# Patient Record
Sex: Female | Born: 1995
Health system: Northeastern US, Community
[De-identification: ages and names within clinical notes are randomized; demographics above are authoritative.]

## PROBLEM LIST (undated history)

## (undated) ENCOUNTER — Inpatient Hospital Stay (HOSPITAL_COMMUNITY): Payer: Self-pay

## (undated) DIAGNOSIS — Z789 Other specified health status: Secondary | ICD-10-CM

## (undated) HISTORY — PX: NO SIGNIFICANT SURGICAL HISTORY: 1000005

## (undated) HISTORY — PX: TONSILLECTOMY: SUR1361

## (undated) HISTORY — PX: HERNIA REPAIR: SHX51

---
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
## 2014-07-07 ENCOUNTER — Telehealth (HOSPITAL_BASED_OUTPATIENT_CLINIC_OR_DEPARTMENT_OTHER): Payer: Self-pay | Admitting: Family Medicine

## 2014-07-07 DIAGNOSIS — H35419 Lattice degeneration of retina, unspecified eye: Secondary | ICD-10-CM

## 2014-07-07 NOTE — Progress Notes (Signed)
Please call this patient and let her know that she should not be seeing an optometrist any more, only ophthalmologists for her eye care.  I can refer her to our [facility], but the [facility] referral is not recommended for her.

## 2014-07-07 NOTE — Progress Notes (Signed)
Appointment scheduled IPJ:[id number]    Specialty Location:   Other: [facility] in [place]                                                   Specialist's name: Dr Venita Sheffield, O.D        Specialist's NPI#: [id number]                            Specialty Phone Number: [phone]     Specialty Fax Number: [phone]    Reason for appointment: Routine eye exam    Day of appt: Friday                                    Date of appt: 07/08/14    Time of appt:    Patient Notification:  Phone call - Spoke with patient mother      If you have any questions concerning the referral authorization please call [phone].    If you have any questions concerning your appointment please call the specialty phone number above.

## 2014-07-15 NOTE — Progress Notes (Signed)
TELEPHONE ENCOUNTER: RETURNED CALL  Spoke to pt. Re: Referral  Pt ID by name and DOB.  Relayed information below exactly as ordered by Dr. Sabra Heck.  Pt reports she has been seeing this optometrist 4-5 years now.  She does not want to switch at this time.  This RN also explained the different between Opthalmologist and Optometrist.  Pt reports she wants to continue going to [facility].  Reports she is unable to book an appt without a referral. Due for a general eye exam.  Informed pt will consult with PCP for recommendations on request.  Pt verbalized understanding to all information provided. Will await call back with recommendations    Msg routed to Dr. Sabra Heck for recommendations on request.    Arletha Grippe, RN, 07/15/2014, 9:24 AM

## 2014-07-15 NOTE — Telephone Encounter (Signed)
-----   Message from Karen Kitchens sent at 07/10/800  8:48 AM EDT -----  Regarding: Referral request  Nelson Chimes,  [id number],  19 year old,  female  Telephone Information:  Home Phone      [phone]  Work Phone      Not on file.  Mobile          [phone]  Patient's PCP:   [name] MD  Patient's language of care:   English     Appointment scheduled for: Ophthalmology (Ext)  Specialty Location:   Other: [facility]   [place], [place]                                              Specialist's name: Dr Venita Sheffield          Specialist's NPI#: [id number]                           Specialty Phone Number: [phone]    Specialty Fax Number: [phone]    Reason for appointment: Annual exam  Day of appt: Friday                                    Date of appt: 07/15/14    Patient Notification:  Other: mother calling

## 2014-07-16 ENCOUNTER — Encounter (HOSPITAL_BASED_OUTPATIENT_CLINIC_OR_DEPARTMENT_OTHER): Payer: Self-pay | Admitting: Family Medicine

## 2014-07-19 NOTE — Progress Notes (Signed)
Referral generated by PCP with recommendations and mychart msg sent to pt.      Arletha Grippe, RN, 07/19/2014, 12:53 PM

## 2014-07-19 NOTE — Telephone Encounter (Signed)
I see that this patient did not read the recent mychart message.  Please call the patient and review the mychart message content. Thank you.

## 2014-07-20 NOTE — Telephone Encounter (Signed)
TELEPHONE ENCOUNTER - TRIAGE ASSESSMENT    Phone call returned to pt to discuss most recent my-chart sent by PCP, returned unread.   Rod Can NUMBER: [phone]  Pt states she already read the message.   Reports going to [facility] years ago, and was advised to keep going to a regular doctor.   Reports regular doctor kept dialating eyes q 2 years @ [facility] but did not recommend a specialist.   Pt does not mind going to see an optometrist at [facility].   Msg routed to Dr. Sabra Heck for review and plan.     Jowell Bossi Colon-Guerrero, RN, 07/20/2014, 5:31 PM

## 2014-07-20 NOTE — Telephone Encounter (Signed)
TELEPHONE ENCOUNTER - TRIAGE ASSESSMENT    Pt contacted per Dr. Sabra Heck request to discuss, last my-chart msg sent by provider, unread.   CALL BACK NUMBER: [phone], her mother contacted.   She states pt is currently in school taking her finals.   She inquires of call? Advised this RN would have to discuss information directly with pt.   Requested to have pt call back RN as requested.     Shamyah Stantz Colon-Guerrero, RN, 07/20/2014, 1:28 PM

## 2014-09-08 ENCOUNTER — Ambulatory Visit (HOSPITAL_BASED_OUTPATIENT_CLINIC_OR_DEPARTMENT_OTHER): Payer: PRIVATE HEALTH INSURANCE | Admitting: Ophthalmology

## 2014-09-08 DIAGNOSIS — H52203 Unspecified astigmatism, bilateral: Secondary | ICD-10-CM | POA: Diagnosis not present

## 2014-09-08 DIAGNOSIS — H35413 Lattice degeneration of retina, bilateral: Secondary | ICD-10-CM | POA: Diagnosis not present

## 2014-09-08 DIAGNOSIS — H5213 Myopia, bilateral: Secondary | ICD-10-CM | POA: Diagnosis not present

## 2014-09-08 NOTE — Progress Notes (Signed)
Assessment:  1) Myopia/astigmatism OU; excellent fit and vision with Air Optix for Astigmatism    Plan:  1) Final rx written. They were educated at length about wear and care of contact lenses and the specific wear and disposal modality of the lens. They were instructed not to sleep, shower, or swim while wearing their contact lenses. The maximum wear time per day is 12 hours, and it is best to wear glasses 1 or 2 full days during the week. It is important to properly clean their lens by either rubbing the lens and storing in clean name-brand multipurpose solution, or by using Clear Care, every night. They should replace their contact lens case monthly, and should keep the case clean by cleaning with soap and water (if using multipurpose solution) and air drying the case daily. They have been educated on risks, signs and symptoms of an eye infection and they are to remove their lenses and call immediately with problems.    Patient to call with problems. Otherwise, monitor 1 year for changes.

## 2014-09-08 NOTE — Nursing Note (Signed)
Patient here for CL update.    Cleans with multipurpose solution.    Insert at 6 am and remove at 10 pm.     No history of infection d/t CLs.    * Has appointment today with Dr. Neoma Laming as well.

## 2014-09-08 NOTE — Progress Notes (Signed)
Impression:  Lattice degeneration OU:  No holes/tears.  Myopic astigmatism OU    Plan:  Warned of signs and symptoms of retinal detachment  Rx given for spectacle lenses/contact lenses  Reassured of ocular health  See 1 year.

## 2014-09-08 NOTE — Nursing Note (Signed)
Patient here for evaluation of blurred vision at distance without glasses and contacts.    Last eye exam about 6-12 months ago.     Saw Dr. In Karoline Caldwell who dx'd with lattice degeneration without holes in the retina.    Wears CLs daily. Has glasses as well, but did not bring them.     Does not sleep in lenses. Discards monthly.     * Having CL update today as well.

## 2014-09-13 ENCOUNTER — Encounter (HOSPITAL_BASED_OUTPATIENT_CLINIC_OR_DEPARTMENT_OTHER): Payer: Self-pay | Admitting: Family Medicine

## 2014-09-13 ENCOUNTER — Ambulatory Visit (HOSPITAL_BASED_OUTPATIENT_CLINIC_OR_DEPARTMENT_OTHER): Payer: PRIVATE HEALTH INSURANCE | Admitting: Family Medicine

## 2014-09-13 VITALS — BP 108/60 | HR 73 | Temp 98.2°F | Ht 64.0 in | Wt 195.0 lb

## 2014-09-13 DIAGNOSIS — H35413 Lattice degeneration of retina, bilateral: Secondary | ICD-10-CM | POA: Diagnosis not present

## 2014-09-13 DIAGNOSIS — Z0000 Encounter for general adult medical examination without abnormal findings: Secondary | ICD-10-CM | POA: Diagnosis not present

## 2014-09-13 DIAGNOSIS — Z23 Encounter for immunization: Secondary | ICD-10-CM

## 2014-09-13 DIAGNOSIS — L700 Acne vulgaris: Secondary | ICD-10-CM | POA: Diagnosis not present

## 2014-09-13 MED ORDER — TRETINOIN 0.025 % EX CREA
TOPICAL_CREAM | Freq: Every evening | CUTANEOUS | 3 refills | Status: AC
Start: 2014-09-13 — End: 2015-03-12

## 2014-09-13 NOTE — Progress Notes (Signed)
Chief Complaint:  Dawn Merritt is a 19 year old female who presents for a physical exam.  Patient denies any current related concerns other than persistence of scarring acne on her chin, has tried OTC treatments to no avail, would like treatment for this.  Newly sexually active, using condoms, does not want to use anything else right now.    Patient Active Problem List:     Obesity, unspecified     Family history of other cardiovascular diseases     Family history of diabetes mellitus     Peripheral retinal degeneration, lattice        Current Outpatient Prescriptions:  tretinoin (RETIN-A) 0.025 % cream Apply topically nightly Disp: 45 g Rfl: 3     No current facility-administered medications for this visit.     Allergies:  Review of Patient's Allergies indicates:  No Known Allergies    Health Maintenance:  AWQ Questionnaire due on 05/03/2012  CHLAMYDIA SCREEN due on 07/24/2014  PHYSICAL EXAM (AGE 8-39) due on 07/24/2015  TDAP/TD VACCINE(7 - Td) due on 05/11/2016  LIPID SCREENING due on 05/14/2017  HEALTH CARE PROXY due on 05/14/2017  HIV SCREENING Completed  HPV VACCINE SERIES Completed  HEP B HIGH RISK VACCINE EVAL (ONCE) Completed    Immunizations:  Immunization History   Administered Date(s) Administered    DTP 07/19/1994, 09/27/1994, 11/20/1994, 11/03/1995, 04/27/1999    HIB 4 Dose Schedule 07/19/1994, 09/27/1994, 11/20/1994, 07/24/1995    HPV-4 (3 DOSE) 02/26/2007, 04/29/2007, 08/31/2007    Hep B Pedi/Adol 3 Dose Less than age 48 1995-10-20, 06/06/1994, 02/03/1995    INFLUENZA VIRUS TRI W/PRESV VACCINE 18/> YRS IM (PRIVATE) 02/06/2009    MMR 07/24/1995, 04/27/1999    Menactra (Meningococcal) 02/06/2006, 05/14/2012    OPV 07/19/1994, 09/27/1994, 11/20/1994, 04/27/1999    PPD 02/03/1995    Tdap 05/12/2006    Varivax (chicken pox vaccine) 12/09/1996, 02/26/2007       Histories:  No past medical history on file.    Past Surgical History    NO SIGNIFICANT SURGICAL HISTORY         Social History    Marital status: Single  Spouse name: N/A    Years of education: N/A  Number of children: N/A     Occupational History  None on file     Social History Main Topics   Smoking status: Passive Smoke Exposure - Never Smoker     Smokeless tobacco: Not on file    Comment: dad smoked in house    Alcohol use Yes    Comment: occasional    Drug use: No    Sexual activity: Yes    Partners: Male    Birth control/ protection: Condom     Other Topics Concern   None on file     Social History Narrative    08/2014 lives with parents in [place].       Family History    Diabetes Maternal Grandmother     Comment: and numerous other fmaily members/    Asthma Paternal Uncle     Asthma Father     Comment: very mild    Heart Maternal Grandmother     Comment: CAD    Cancer - Breast FamHxNeg     Cancer - Colon FamHxNeg     Alcohol/Drug Abuse Maternal Grandfather     Comment: alocholism    Cancer - Ovarian FamHxNeg        Review of Systems:  Skin: per HPI  Eyes: negative  Ears/Nose/Throat: negative  Respiratory: negative  Cardiovascular: negative  Gastrointestinal: negative  Genitourinary: negative  QIO:NGEXBM menses, no abnormal bleeding, pelvic pain or discharge  no breast pain or new or enlarging lumps on self exam  Musculoskeletal: negative  Neurologic: negative  Endocrine: negative  Psychiatric: negative  Hematologic/Lymphatic/Immunologic: negative    Physical:  BP 108/60  Pulse 73  Temp 98.2 F (36.8 C) (Temporal)  Ht _0  (1.626 m)  Wt 88.5 kg (195 lb)  LMP 08/18/2014  SpO2 100%  BMI 33.47 kg/m2  General appearance: healthy, alert, well developed, well nourished  Eyes: negative  Skin: normal other than closed and open comedones of forehead and chin and moderate acne scarring and some inflammatory papules on chin.  Head: Normocephalic. No masses, lesions, tenderness or abnormalities  Ears: External ears normal. Canals clear. TM's normal.  Nose/Sinuses: neg  Oropharynx: Lips, mucosa, and  tongue normal. Teeth and gums normal. Oropharynx moist and without lesion  Neck: Neck supple. No adenopathy. Thyroid symmetric, normal size, and without nodularity  Back: Back symmetric, no curvature. ROM normal. No CVA tenderness.  Lungs: Percussion normal. Good diaphragmatic excursion. Lungs clear to auscultation bilaterally  Heart: PMI normal. No lifts, heaves, or thrills. RRR. No murmurs, clicks, gallops or rubs  Breast: not examined  Abdomen: Abdomen soft, non-tender. BS normal. No masses, no organomegaly  Extremities: Extremities normal. No deformities, edema, or skin discoloration  Musculoskeletal: Spine ROM normal. Muscular strength intact.  Neuro: grossly normal.   Pelvic: not examined  Rectal: not examined    Health Counseling:  Smoking:  Reviewed and Discussed  Substance Use Issues:   Reviewed and Discussed   Seat Belts:  Reviewed and Discussed  diet, Exercise, Wt. Control:  Reviewed and Discussed  Dental Health:  Reviewed and Discussed  Vision Health:  Reviewed and Discussed  Mental Health:  Reviewed and Discussed  Domestic Violence:  Reviewed and Discussed  Sexual Health:  Reviewed and Discussed    ASSESSMENT/PLAN:  (Z00.00) Routine general medical examination at a health care facility  (primary encounter diagnosis)  Comment: Due for routine well exam and age and risk based screening and counseling    Plan:     (H35.413) Peripheral retinal degeneration, lattice, bilateral  Comment: recently seen for this, updated problem list.  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  Plan: tretinoin (RETIN-A) 0.025 % cream        The use, side effects  and expected effects of this medication are reviewed with the patient.

## 2014-09-18 ENCOUNTER — Ambulatory Visit (HOSPITAL_BASED_OUTPATIENT_CLINIC_OR_DEPARTMENT_OTHER): Payer: Self-pay | Admitting: Ophthalmology

## 2015-04-21 ENCOUNTER — Ambulatory Visit (HOSPITAL_BASED_OUTPATIENT_CLINIC_OR_DEPARTMENT_OTHER): Payer: PRIVATE HEALTH INSURANCE | Admitting: Rehabilitative and Restorative Service Providers"

## 2015-04-21 ENCOUNTER — Encounter (HOSPITAL_BASED_OUTPATIENT_CLINIC_OR_DEPARTMENT_OTHER): Payer: Self-pay | Admitting: Rehabilitative and Restorative Service Providers"

## 2015-04-21 VITALS — BP 118/68 | HR 85 | Temp 97.4°F | Wt 202.0 lb

## 2015-04-21 DIAGNOSIS — H6992 Unspecified Eustachian tube disorder, left ear: Secondary | ICD-10-CM

## 2015-04-21 DIAGNOSIS — Z113 Encounter for screening for infections with a predominantly sexual mode of transmission: Secondary | ICD-10-CM

## 2015-04-21 DIAGNOSIS — H6982 Other specified disorders of Eustachian tube, left ear: Secondary | ICD-10-CM | POA: Diagnosis not present

## 2015-04-21 NOTE — Progress Notes (Signed)
Date of Service:04/21/2015    Dawn Merritt is a 20 year old female who presents for blocked ear.    Has had cold symptoms for 1 week.  Sore throat, cough, and nasal dripping have improved but still having nasal congestion and now left ear feels blocked.  No pain.  No fevers.  Taking Nyquil only.      #  Not currently sexually active.  Last partner was August 2016.     PMHx:  Patient Active Problem List:     Obesity, unspecified     Family history of other cardiovascular diseases     Family history of diabetes mellitus     Peripheral retinal degeneration, lattice      Medications: No regular medications  Allergies: NKDA    SOCIAL: Non-smoker.  Student at [employer].    OBJECTIVE:  General:  Well appearing, interactive, cooperative, in no apparent distress  Vitals: BP 118/68  Pulse 85  Temp 97.4 F (36.3 C) (Temporal)  Wt 91.6 kg (202 lb)  LMP 03/12/2015 (Exact Date)  SpO2 98%  BMI 34.67 kg/m2  Ears: TM visible bilaterally with no erythema, good cone of light, no bulging  Nose: mucous membranes and turbinates erythematous and edematous  Neck: No lymphadenopathy or tenderness  Heart: RRR, no murmurs, rubs or gallops  Lungs: CTA bilaterally with no wheezes, rhonchi or rales    ASSESSMENT/PLAN:   (H69.82) Eustachian tube dysfunction, left  (primary encounter diagnosis)  Comment: In setting of URI.  Plan: Discussed anatomy and etiology.  Recommended management with decongestant if she desires.   Expect gradual improvement.   Follow-up PRN.    (Z11.3) Screening for STD (sexually transmitted disease)  Plan: AMPLIFIED GENPROBE CHLAM/GC          Declines flu vaccine.

## 2015-04-21 NOTE — Patient Instructions (Signed)
Phenylephrine   Pseudoephedrine - ask the pharmacist - can cause caffeine side effect

## 2015-04-24 LAB — CHLAMYDIA GC NAAT
GENPROBE CHLAMYDIA: NEGATIVE
GENPROBE GC: NEGATIVE

## 2015-04-24 NOTE — Progress Notes (Signed)
Negative GC/Chlamydia noted.  Results released via MyChart to patient.

## 2015-05-20 ENCOUNTER — Emergency Department (INDEPENDENT_AMBULATORY_CARE_PROVIDER_SITE_OTHER)
Admission: EM | Admit: 2015-05-20 | Discharge: 2015-05-20 | Disposition: A | Discharge status: Home / Self Care | Payer: Self-pay | Source: Home / Self Care

## 2015-05-20 ENCOUNTER — Encounter (HOSPITAL_COMMUNITY): Payer: Self-pay | Admitting: Emergency Medicine

## 2015-05-20 DIAGNOSIS — J069 Acute upper respiratory infection, unspecified: Secondary | ICD-10-CM

## 2015-05-20 DIAGNOSIS — T2210XA Burn of first degree of shoulder and upper limb, except wrist and hand, unspecified site, initial encounter: Secondary | ICD-10-CM

## 2015-05-20 DIAGNOSIS — R531 Weakness: Secondary | ICD-10-CM

## 2015-05-20 DIAGNOSIS — B349 Viral infection, unspecified: Secondary | ICD-10-CM

## 2015-05-20 NOTE — Discharge Instructions (Signed)
Burn Care Your skin is a natural barrier to infection. It is the largest organ of your body. Burns damage this natural protection. To help prevent infection, it is very important to follow your caregiver's instructions in the care of your burn. Burns are classified as:  First degree. There is only redness of the skin (erythema). No scarring is expected.  Second degree. There is blistering of the skin. Scarring may occur with deeper burns.  Third degree. All layers of the skin are injured, and scarring is expected. HOME CARE INSTRUCTIONS   Wash your hands well before changing your bandage.  Change your bandage as often as directed by your caregiver.  Remove the old bandage. If the bandage sticks, you may soak it off with cool, clean water.  Cleanse the burn thoroughly but gently with mild soap and water.  Pat the area dry with a clean, dry cloth.  Apply a thin layer of antibacterial cream to the burn.  Apply a clean bandage as instructed by your caregiver.  Keep the bandage as clean and dry as possible.  Elevate the affected area for the first 24 hours, then as instructed by your caregiver.  Only take over-the-counter or prescription medicines for pain, discomfort, or fever as directed by your caregiver. SEEK IMMEDIATE MEDICAL CARE IF:   You develop excessive pain.  You develop redness, tenderness, swelling, or red streaks near the burn.  The burned area develops yellowish-white fluid (pus) or a bad smell.  You have a fever. MAKE SURE YOU:   Understand these instructions.  Will watch your condition.  Will get help right away if you are not doing well or get worse.   This information is not intended to replace advice given to you by your health care provider. Make sure you discuss any questions you have with your health care provider.   Document Released: 02/25/2005 Document Revised: 05/20/2011 Document Reviewed: 07/18/2010 Elsevier Interactive Patient Education 2016  [employer].  Fatigue Fatigue is feeling tired all of the time, a lack of energy, or a lack of motivation. Occasional or mild fatigue is often a normal response to activity or life in general. However, long-lasting (chronic) or extreme fatigue may indicate an underlying medical condition. HOME CARE INSTRUCTIONS  Watch your fatigue for any changes. The following actions may help to lessen any discomfort you are feeling:  Talk to your health care provider about how much sleep you need each night. Try to get the required amount every night.  Take medicines only as directed by your health care provider.  Eat a healthy and nutritious diet. Ask your health care provider if you need help changing your diet.  Drink enough fluid to keep your urine clear or pale yellow.  Practice ways of relaxing, such as yoga, meditation, massage therapy, or acupuncture.  Exercise regularly.   Change situations that cause you stress. Try to keep your work and personal routine reasonable.  Do not abuse illegal drugs.  Limit alcohol intake to no more than 1 drink per day for nonpregnant women and 2 drinks per day for men. One drink equals 12 ounces of beer, 5 ounces of wine, or 1 ounces of hard liquor.  Take a multivitamin, if directed by your health care provider. SEEK MEDICAL CARE IF:   Your fatigue does not get better.  You have a fever.   You have unintentional weight loss or gain.  You have headaches.   You have difficulty:   Falling asleep.  Sleeping throughout  the night.  You feel angry, guilty, anxious, or sad.   You are unable to have a bowel movement (constipation).   You skin is dry.   Your legs or another part of your body is swollen.  SEEK IMMEDIATE MEDICAL CARE IF:   You feel confused.   Your vision is blurry.  You feel faint or pass out.   You have a severe headache.   You have severe abdominal, pelvic, or back pain.   You have chest pain, shortness of  breath, or an irregular or fast heartbeat.   You are unable to urinate or you urinate less than normal.   You develop abnormal bleeding, such as bleeding from the rectum, vagina, nose, lungs, or nipples.  You vomit blood.   You have thoughts about harming yourself or committing suicide.   You are worried that you might harm someone else.    This information is not intended to replace advice given to you by your health care provider. Make sure you discuss any questions you have with your health care provider.   Document Released: 12/23/2006 Document Revised: 03/18/2014 Document Reviewed: 06/29/2013 Elsevier Interactive Patient Education 2016 Elsevier Inc.  Viral Infections For nasal and head congestion may take Sudafed PE 10 mg every 4 hours as needed. Saline nasal spray used frequently. For drainage may use Allegra, Claritin or Zyrtec. If you need stronger medicine to stop drainage may take Chlor-Trimeton 2-4 mg every 4 hours. This may cause drowsiness. Ibuprofen 600 mg every 6 hours as needed for pain, discomfort or fever. Drink plenty of fluids and stay well-hydrated.  A virus is a type of germ. Viruses can cause:  Minor sore throats.  Aches and pains.  Headaches.  Runny nose.  Rashes.  Watery eyes.  Tiredness.  Coughs.  Loss of appetite.  Feeling sick to your stomach (nausea).  Throwing up (vomiting).  Watery poop (diarrhea). HOME CARE   Only take medicines as told by your doctor.  Drink enough water and fluids to keep your pee (urine) clear or pale yellow. Sports drinks are a good choice.  Get plenty of rest and eat healthy. Soups and broths with crackers or rice are fine. GET HELP RIGHT AWAY IF:   You have a very bad headache.  You have shortness of breath.  You have chest pain or neck pain.  You have an unusual rash.  You cannot stop throwing up.  You have watery poop that does not stop.  You cannot keep fluids down.  You or your  child has a temperature by mouth above 102 F (38.9 C), not controlled by medicine.  Your baby is older than 3 months with a rectal temperature of 102 F (38.9 C) or higher.  Your baby is 273 months old or younger with a rectal temperature of 100.4 F (38 C) or higher. MAKE SURE YOU:   Understand these instructions.  Will watch this condition.  Will get help right away if you are not doing well or get worse.   This information is not intended to replace advice given to you by your health care provider. Make sure you discuss any questions you have with your health care provider.   Document Released: 02/08/2008 Document Revised: 05/20/2011 Document Reviewed: 08/03/2014 Elsevier Interactive Patient Education [employer] Elsevier Inc.
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
## 2015-05-24 ENCOUNTER — Encounter (HOSPITAL_BASED_OUTPATIENT_CLINIC_OR_DEPARTMENT_OTHER): Payer: Self-pay | Admitting: Rehabilitative and Restorative Service Providers"

## 2015-05-24 ENCOUNTER — Ambulatory Visit (HOSPITAL_BASED_OUTPATIENT_CLINIC_OR_DEPARTMENT_OTHER): Payer: PRIVATE HEALTH INSURANCE | Admitting: Rehabilitative and Restorative Service Providers"

## 2015-05-24 VITALS — BP 98/60 | HR 115 | Temp 96.6°F | Wt 205.4 lb

## 2015-05-24 DIAGNOSIS — J029 Acute pharyngitis, unspecified: Secondary | ICD-10-CM | POA: Diagnosis not present

## 2015-05-24 LAB — RAPID STREP (POINT OF CARE): STREP SCREEN: NEGATIVE

## 2015-05-24 MED ORDER — PENICILLIN V POTASSIUM 250 MG/5ML PO SOLR
500.00 mg | Freq: Two times a day (BID) | ORAL | 0 refills | Status: AC
Start: 2015-05-24 — End: 2015-06-03

## 2015-05-24 NOTE — Progress Notes (Signed)
Date of Service:05/24/2015    Dawn Merritt is a 20 year old female who presents for sore throat.    Started with feeling hot flashes hot flashes about 6 days ago.  3 days ago developed sore throat, particularly with swallowing.  Hasn't checked temp.  No cough or congestion.  No known sick contacts.  Taking dayquil and nyquil, cough drops - hasn't changed anything.    PMHx:  Patient Active Problem List:     Obesity, unspecified     Family history of other cardiovascular diseases     Family history of diabetes mellitus     Peripheral retinal degeneration, lattice      Medications: No regular medications  Allergies: NKDA    SOCIAL: Non-smoker.    OBJECTIVE:  General:  Well appearing, interactive, cooperative, in no apparent distress  Vitals: BP 98/60  Pulse 115  Temp 96.6 F (35.9 C) (Temporal)  Wt 93.2 kg (205 lb 6.4 oz)  LMP 04/26/2015  SpO2 98%  BMI 35.26 kg/m2  Nose: mucous membranes without erythema or edema, turbinates well visualized  Mouth/Throat: mucous membranes moist, tonsils 2+ and erythematous with significant exudate.  No petechiae.  + halitosis.  Neck: anterior cervical lymphadenopathy, no significant tenderness, no posterior lymphadenopathy.  Heart: RRR, no murmurs, rubs or gallops  Lungs: CTA bilaterally with no wheezes, rhonchi or rales    Throat swab - unable to get adequate sample due to gag reflex.  Rapid strep negative.    ASSESSMENT/PLAN:   (J02.9) Acute pharyngitis, unspecified etiology  (primary encounter diagnosis)  Comment: Meets 2/4 centor criteria (as lymphadenopathy not painful) and rapid sample unreliable.  Discussed options with patient.  Patient opts for treatment with antibiotics - difficulty swallowing pills --> liquid rx'd.  Plan: RAPID STREP (POINT OF CARE), penicillin v         potassium (VEETID) 250 MG/5ML suspension,         THROAT CULTURE, BETA STREP        Stressed importance of fluids and completing full course of antibiotics despite improvement in sore throat.   Follow-up  PRN.

## 2015-05-26 LAB — THROAT CULTURE BETA STREP

## 2015-08-08 ENCOUNTER — Other Ambulatory Visit (HOSPITAL_BASED_OUTPATIENT_CLINIC_OR_DEPARTMENT_OTHER): Payer: Self-pay

## 2015-08-08 NOTE — Telephone Encounter (Signed)
Pt is due for Pap Smear.Left message on voicemail to call the clinic to schedule appt.

## 2015-09-08 ENCOUNTER — Telehealth (HOSPITAL_BASED_OUTPATIENT_CLINIC_OR_DEPARTMENT_OTHER): Payer: Self-pay | Admitting: Family Medicine

## 2015-09-08 DIAGNOSIS — H35413 Lattice degeneration of retina, bilateral: Secondary | ICD-10-CM

## 2015-09-08 NOTE — Progress Notes (Signed)
Appointment scheduled for: Opthalmology     Specialty Location:  [facility] in [facility]                             Specialist's name: Dr Venita Sheffield     Specialist's NPI#: [id number]               Specialty Phone Number: [phone]   Specialty Fax Number: [phone]     Reason for appointment: annual eye exam     Day of appt: Friday                    Date of appt: 09/22/15    Time of appt:    Patient Notification: Phone call - Spoke with Mom   Visits: 3     Prior Auth Needed: No       If you have any questions concerning the referral authorization please call [phone].     If you have any questions concerning your appointment please call the specialty phone number above.

## 2015-10-02 ENCOUNTER — Ambulatory Visit (HOSPITAL_BASED_OUTPATIENT_CLINIC_OR_DEPARTMENT_OTHER): Payer: PRIVATE HEALTH INSURANCE | Admitting: Family Medicine

## 2015-10-02 ENCOUNTER — Encounter (HOSPITAL_BASED_OUTPATIENT_CLINIC_OR_DEPARTMENT_OTHER): Payer: Self-pay | Admitting: Family Medicine

## 2015-10-02 VITALS — BP 104/60 | HR 106 | Temp 97.0°F | Ht 64.0 in | Wt 201.0 lb

## 2015-10-02 DIAGNOSIS — R87619 Unspecified abnormal cytological findings in specimens from cervix uteri: Secondary | ICD-10-CM | POA: Diagnosis not present

## 2015-10-02 DIAGNOSIS — Z0000 Encounter for general adult medical examination without abnormal findings: Secondary | ICD-10-CM | POA: Diagnosis not present

## 2015-10-02 DIAGNOSIS — R87612 Low grade squamous intraepithelial lesion on cytologic smear of cervix (LGSIL): Secondary | ICD-10-CM | POA: Diagnosis not present

## 2015-10-02 DIAGNOSIS — Z124 Encounter for screening for malignant neoplasm of cervix: Secondary | ICD-10-CM | POA: Diagnosis not present
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
## 2015-10-03 LAB — CHLAMYDIA GC NAAT
GENPROBE CHLAMYDIA: NEGATIVE
GENPROBE GC: NEGATIVE

## 2015-10-13 LAB — CYTOPATH, C/V, THIN LAYER

## 2015-10-16 ENCOUNTER — Encounter (HOSPITAL_BASED_OUTPATIENT_CLINIC_OR_DEPARTMENT_OTHER): Payer: Self-pay | Admitting: Family Medicine

## 2015-10-16 DIAGNOSIS — R87612 Low grade squamous intraepithelial lesion on cytologic smear of cervix (LGSIL): Secondary | ICD-10-CM | POA: Insufficient documentation

## 2015-11-06 ENCOUNTER — Emergency Department (HOSPITAL_COMMUNITY): Payer: Self-pay

## 2015-11-06 ENCOUNTER — Encounter (HOSPITAL_COMMUNITY): Payer: Self-pay

## 2015-11-06 DIAGNOSIS — S5011XA Contusion of right forearm, initial encounter: Secondary | ICD-10-CM | POA: Insufficient documentation

## 2015-11-06 DIAGNOSIS — W1839XA Other fall on same level, initial encounter: Secondary | ICD-10-CM | POA: Insufficient documentation

## 2015-11-06 DIAGNOSIS — Y929 Unspecified place or not applicable: Secondary | ICD-10-CM | POA: Insufficient documentation

## 2015-11-06 DIAGNOSIS — Y93K1 Activity, walking an animal: Secondary | ICD-10-CM | POA: Insufficient documentation

## 2015-11-06 DIAGNOSIS — Y999 Unspecified external cause status: Secondary | ICD-10-CM | POA: Insufficient documentation

## 2015-11-06 NOTE — ED Triage Notes (Signed)
Pt complaining of R arm pain. Pt states was walking dog, dog pulled on leash, hurt R arm. Pt with some swelling to R lateral aspect of arm.

## 2015-11-07 ENCOUNTER — Emergency Department (HOSPITAL_COMMUNITY)
Admission: EM | Admit: 2015-11-07 | Discharge: 2015-11-07 | Disposition: A | Discharge status: Home / Self Care | Payer: Self-pay | Attending: Emergency Medicine | Admitting: Emergency Medicine

## 2015-11-07 DIAGNOSIS — S5011XA Contusion of right forearm, initial encounter: Secondary | ICD-10-CM

## 2015-11-07 MED ORDER — IBUPROFEN 800 MG PO TABS
800.0000 mg | ORAL_TABLET | Freq: Once | ORAL | Status: AC
  Administered 2015-11-07: 800 mg via ORAL
  Filled 2015-11-07: qty 1

## 2015-11-07 MED ORDER — IBUPROFEN 600 MG PO TABS: 600.0000 mg | ORAL_TABLET | Freq: Four times a day (QID) | ORAL | 0 refills | Status: DC | PRN

## 2015-11-07 NOTE — ED Provider Notes (Signed)
[facility]-EMERGENCY DEPT Provider Note   CSN: [id number] Arrival date & time: 11/06/15  2307     History   Chief Complaint Chief Complaint  Patient presents with  . Arm Pain    HPI Emily Levine is a 20 y.o. female.  20 year old female with no significant past medical history presents to the emergency department for evaluation of right arm pain. Patient states that she was walking her dog at 2230 this evening when the dog forcefully pulled on the leash causing her to fall into a pole. She states that the majority of the impact was to her right forearm. She denies taking any medications prior to arrival. No icing prior to ED evaluation. She has noted some swelling and pain which is nonradiating. Pain is worse with palpation to the area. No reported head injury or trauma.   The history is provided by the patient. No language interpreter was used.    History reviewed. No pertinent past medical history.  There are no active problems to display for this patient.   History reviewed. No pertinent surgical history.  OB History    No data available       Home Medications    Prior to Admission medications   Medication Sig Start Date End Date Taking? Authorizing Provider  ibuprofen (ADVIL,MOTRIN) 600 MG tablet Take 1 tablet (600 mg total) by mouth every 6 (six) hours as needed. 11/07/15   Antony Madura, PA-C    Family History History reviewed. No pertinent family history.  Social History Social History  Substance Use Topics  . Smoking status: Never Smoker  . Smokeless tobacco: Never Used  . Alcohol use No     Allergies   Review of patient's allergies indicates no known allergies.   Review of Systems Review of Systems  Musculoskeletal: Positive for joint swelling and myalgias.  Skin: Positive for color change.  Neurological: Negative for weakness and numbness.  Ten systems reviewed and are negative for acute change, except as noted in the HPI.     Physical  Exam Updated Vital Signs BP 111/59   Pulse 78   Temp 98 F (36.7 C) (Oral)   Resp 16   Ht 5\' 5"  (1.651 m)   Wt 65.4 kg   LMP 10/31/2015   SpO2 100%   BMI 23.99 kg/m   Physical Exam  Constitutional: She is oriented to person, place, and time. She appears well-developed and well-nourished. No distress.  Nontoxic appearing and in no distress  HENT:  Head: Normocephalic and atraumatic.  Eyes: Conjunctivae and EOM are normal. No scleral icterus.  Neck: Normal range of motion.  Cardiovascular: Normal rate, regular rhythm and intact distal pulses.   Distal radial pulse 2+ in the right upper extremity.  Pulmonary/Chest: Effort normal. No respiratory distress.  Respirations even and unlabored  Musculoskeletal: Normal range of motion. She exhibits tenderness.       Right forearm: She exhibits tenderness and swelling. She exhibits no deformity and no laceration.       Arms: Preserved range of motion of the right upper extremity. Normal range of motion at the right elbow and right wrist. There is mild soft tissue swelling and ecchymosis consistent with contusion of forearm. Contusion associated with localized tenderness. No bony deformity or crepitus. Compartment soft.  Neurological: She is alert and oriented to person, place, and time.  Grip strength 5/5 in the right upper extremity. Sensation to light touch intact.  Skin: Skin is warm and dry. No rash noted. She is  not diaphoretic. No erythema. No pallor.  Psychiatric: She has a normal mood and affect. Her behavior is normal.  Nursing note and vitals reviewed.    ED Treatments / Results  Labs (all labs ordered are listed, but only abnormal results are displayed) Labs Reviewed - No data to display  EKG  EKG Interpretation None       Radiology Dg Forearm Right  Result Date: 11/07/2015 CLINICAL DATA:  Pain and bruising in the mid right forearm after injury tonight. EXAM: RIGHT FOREARM - 2 VIEW COMPARISON:  None. FINDINGS: There  is no evidence of fracture or other focal bone lesions. Soft tissues are unremarkable. IMPRESSION: Negative. Electronically Signed   By: Burman NievesWilliam  Stevens M.D.   On: 11/07/2015 00:04    Procedures Procedures (including critical care time)  Medications Ordered in ED Medications  ibuprofen (ADVIL,MOTRIN) tablet 800 mg (not administered)     Initial Impression / Assessment and Plan / ED Course  I have reviewed the triage vital signs and the nursing notes.  Pertinent labs & imaging results that were available during my care of the patient were reviewed by me and considered in my medical decision making (see chart for details).  Clinical Course    20 year old female presents to the emergency department for evaluation of right forearm pain. Patient is neurovascularly intact. X-ray negative for fracture. These images have been reviewed by this Clinical research associatewriter with the patient at bedside. Symptoms consistent with contusion. Will manage supportively with NSAIDs and icing. I do not believe further emergent workup is indicated. Patient discharged in satisfactory condition with no unaddressed concerns.   Final Clinical Impressions(s) / ED Diagnoses   Final diagnoses:  Forearm contusion, right, initial encounter    New Prescriptions New Prescriptions   IBUPROFEN (ADVIL,MOTRIN) 600 MG TABLET    Take 1 tablet (600 mg total) by mouth every 6 (six) hours as needed.     Antony MaduraKelly Millicent Blazejewski, PA-C 11/07/15 0225    Gilda Creasehristopher J Pollina, MD 11/07/15 0730

## 2016-01-24 ENCOUNTER — Encounter (HOSPITAL_BASED_OUTPATIENT_CLINIC_OR_DEPARTMENT_OTHER): Payer: Self-pay | Admitting: Registered Nurse

## 2016-01-24 DIAGNOSIS — R87612 Low grade squamous intraepithelial lesion on cytologic smear of cervix (LGSIL): Secondary | ICD-10-CM

## 2016-01-24 NOTE — Progress Notes (Signed)
Abn Cancer Screen Tracking Pap    Pap Smear: 10/02/15; LGSIL  HPV: N/A  Patient notification date: 10/16/15  Mode of notification: my-chart  Follow Up plan: co-testing x 1 yr; due 10/01/16  Both HM and PL updated to reflect current plan.   Outreach will be made at appropriate time.     Jaianna Nicoll Colon-Guerrero, RN, 01/24/2016, 1:15 PM

## 2016-03-22 ENCOUNTER — Ambulatory Visit (HOSPITAL_BASED_OUTPATIENT_CLINIC_OR_DEPARTMENT_OTHER): Payer: Self-pay | Admitting: Physician Assistant

## 2016-04-18 ENCOUNTER — Ambulatory Visit (HOSPITAL_BASED_OUTPATIENT_CLINIC_OR_DEPARTMENT_OTHER): Payer: Self-pay | Admitting: Family Medicine

## 2016-04-18 ENCOUNTER — Ambulatory Visit (HOSPITAL_BASED_OUTPATIENT_CLINIC_OR_DEPARTMENT_OTHER): Payer: Self-pay | Admitting: Ambulatory Care

## 2016-04-18 VITALS — BP 110/70 | HR 127 | Temp 97.3°F

## 2016-04-18 DIAGNOSIS — B349 Viral infection, unspecified: Secondary | ICD-10-CM

## 2016-04-18 NOTE — Progress Notes (Signed)
SUBJECTIVE: Dawn Merritt is a 21 year old female here for concerns about acute illness.  For about 4 days has had some sore throat, stuffy nose and dry cough.  No measured fever, mild body aches but marked fatigue.  Since yesterday has developed nausea without vomiting and mild diarrhea.  Anorexia but drinking fluids well.  No abdominal pain.  Mom with similar symptoms.   Smoking status: Passive Smoke Exposure - Never Smoker                                      Packs/day: 0.00      Years: 0.00      Comment: dad smoked in house  Alcohol use: Yes              Comment: occasional    OBJECTIVE: She appears well, in no apparent distress.  Alert and oriented times three, pleasant and cooperative. Vital signs are as noted by the nurse. BP 110/70 (Site: LA, Position: Sitting, Cuff Size: Lrg)  Pulse 127  Temp 97.3 F (36.3 C) (Temporal)  LMP 04/12/2016  SpO2 99% Skin very warm and moist.  TM's normal, OP with minimal posterior erythema, nares normal.  Neck supple no LAD.  LUngs clear. Heart rrr no mrg.  The abdomen is soft without tenderness, guarding, mass, rebound or organomegaly. Bowel sounds are normal. No CVA tenderness or inguinal adenopathy noted.     (B34.9) Viral syndrome  Comment: started like URI now with some GI sx, she appears well, afebrile (no antipyretics on board).  Well hydrated and appears nontoxic with benign exam.  Plan:  The patient is reassured that these symptoms do not appear to represent a serious or threatening condition. Symptomatic therapy suggested: incrased fluids, gargle for sore throat May use acetaminophen prn.  Call or return to clinic prn if these symptoms worsen or fail to improve as anticipated.

## 2016-04-18 NOTE — Telephone Encounter (Signed)
Spoke with Mom and confirmed appointment for [facility] at 2:20 pm

## 2016-04-18 NOTE — Telephone Encounter (Signed)
Regarding: sore throat for 5 days  ----- Message from [facility] sent at 04/18/2016 10:13 AM EST -----  Nelson Chimes [id number], 21 year old, female    Calls today:  Sick    What are the symptoms sore throat for 5 days  How long has patient been sick? sore throat for 5 days  What has pt. tried at home nyquil  Person calling on behalf of patient: Mother patient mother stated that she have 2:00PM appointment today  with Dr. Sabra Heck she wants to know if she can see her daughter     CALL BACK NUMBER:[phone]    Patient's language of care: English    Patient does not need an interpreter.    Patient's PCP: [name] MD

## 2016-04-23 ENCOUNTER — Encounter (HOSPITAL_BASED_OUTPATIENT_CLINIC_OR_DEPARTMENT_OTHER): Payer: Self-pay | Admitting: Medical

## 2016-04-23 ENCOUNTER — Emergency Department (HOSPITAL_BASED_OUTPATIENT_CLINIC_OR_DEPARTMENT_OTHER)
Admission: RE | Admit: 2016-04-23 | Disposition: A | Discharge status: Home / Self Care | Payer: Self-pay | Source: Emergency Department | Attending: Emergency Medicine | Admitting: Emergency Medicine

## 2016-04-23 LAB — CBC, PLATELET & DIFFERENTIAL
ABSOLUTE BASO COUNT: 0 10*3/uL (ref 0.0–0.1)
ABSOLUTE EOSINOPHIL COUNT: 0 10*3/uL (ref 0.0–0.8)
ABSOLUTE IMM GRAN COUNT: 0.01 10*3/uL (ref 0.00–0.03)
ABSOLUTE LYMPH COUNT: 2 10*3/uL (ref 0.6–5.9)
ABSOLUTE MONO COUNT: 0.3 10*3/uL (ref 0.2–1.4)
ABSOLUTE NEUTROPHIL COUNT: 2.8 10*3/uL (ref 1.6–8.3)
BASOPHIL %: 0.2 % (ref 0.0–1.2)
EOSINOPHIL %: 0.4 % (ref 0.0–7.0)
HEMATOCRIT: 40.2 % (ref 34.1–44.9)
HEMOGLOBIN: 14.5 g/dL (ref 11.2–15.7)
IMMATURE GRANULOCYTE %: 0.2 % (ref 0.0–0.4)
LYMPHOCYTE %: 39.2 % (ref 15.0–54.0)
MEAN CORP HGB CONC: 36.1 g/dL (ref 31.0–37.0)
MEAN CORPUSCULAR HGB: 30.9 pg (ref 26.0–34.0)
MEAN CORPUSCULAR VOL: 85.5 fL (ref 80.0–100.0)
MEAN PLATELET VOLUME: 9.2 fL (ref 8.7–12.5)
MONOCYTE %: 6.3 % (ref 4.0–13.0)
NEUTROPHIL %: 53.7 % (ref 40.0–75.0)
PLATELET COUNT: 202 10*3/uL (ref 150–400)
RBC DISTRIBUTION WIDTH STD DEV: 37.3 fL (ref 35.1–46.3)
RBC DISTRIBUTION WIDTH: 11.9 % (ref 11.5–14.3)
RED BLOOD CELL COUNT: 4.7 M/uL (ref 3.90–5.20)
WHITE BLOOD CELL COUNT: 5.2 10*3/uL (ref 4.0–11.0)

## 2016-04-23 LAB — URINE PREGNANCY TEST (POINT OF CARE): HCG QUALITATIVE URINE: NEGATIVE

## 2016-04-23 MED ORDER — ONDANSETRON 4 MG PO TBDP: 4 mg | tablet | Freq: Three times a day (TID) | ORAL | 0 refills | 0 days | Status: AC | PRN

## 2016-04-23 MED ORDER — ONDANSETRON 4 MG PO TBDP
4.00 mg | ORAL_TABLET | Freq: Once | ORAL | Status: AC
Start: 2016-04-23 — End: 2016-04-23
  Administered 2016-04-23: 4 mg via ORAL
  Filled 2016-04-23: qty 1

## 2016-04-23 MED ORDER — ONDANSETRON 4 MG PO TBDP
4.00 mg | ORAL_TABLET | Freq: Three times a day (TID) | ORAL | 0 refills | Status: AC | PRN
Start: 2016-04-23 — End: 2016-04-26

## 2016-04-23 NOTE — ED Provider Notes (Signed)
The patient was seen primarily by me. ED nursing record was reviewed. Select prior records as available electronically through the Epic record were reviewed.      HPI:    Dawn Merritt is a 21 year old female patient without significant PMH here for evaluation of cough, malaise, abdominal discomfort nausea for the past week. The patient states she had gradual onset of symptoms. Initially had a sore throat which has resolved. She has had a nonproductive cough. No chest pain or shortness of breath. The patient states she saw her primary care provider who told her she had a viral illness.     ROS: Pertinent positives were reviewed as per the HPI above. All other systems were reviewed and are negative.  Nelson Chimes  Language of care: English  MRN: [id number]  PCP: [name] MD  Mode of arrival to ED: Relative.  Arrival time: 04/23/2016 11:53 AM  Chief complaint: Cough (NAUSEA AND COUGH) and Fatigue    Past Medical History/Problem list:  History reviewed. No pertinent past medical history.  Patient Active Problem List:     Obesity, unspecified     Family history of other cardiovascular diseases     Family history of diabetes mellitus     Peripheral retinal degeneration, lattice     Low grade squamous intraepith lesion on cytologic smear cervix (lgsil)    Past Surgical History: Past Surgical History:  No date: NO SIGNIFICANT SURGICAL HISTORY  Social History:   Social History  Social History   Marital status: Single  Spouse name: N/A    Years of education: N/A  Number of children: N/A     Occupational History  None on file     Social History Main Topics   Smoking status: Passive Smoke Exposure - Never Smoker     Smokeless tobacco: Not on file    Comment: dad smoked in house    Alcohol use Yes    Comment: occasional    Drug use: No    Sexual activity: Not Currently    Partners: Male    Birth control/ protection: Condom     Other Topics Concern   None on file     Social History Narrative    08/2014 lives with parents  in [place].      Allergies: Review of Patient's Allergies indicates:  No Known Allergies  Immunizations:   Immunization History   Administered Date(s) Administered    DTP 07/19/1994, 09/27/1994, 11/20/1994, 11/03/1995, 04/27/1999    HIB 4 Dose Schedule 07/19/1994, 09/27/1994, 11/20/1994, 07/24/1995    HPV-4 (3 DOSE) 02/26/2007, 04/29/2007, 08/31/2007    Hep B Pedi/Adol 3 Dose Less than age 55 06-10-94, 06/06/1994, 02/03/1995    INFLUENZA VIRUS TRI W/PRESV VACCINE 18/> YRS IM (PRIVATE) 02/06/2009    MMR 07/24/1995, 04/27/1999    Menactra (Meningococcal) 02/06/2006, 05/14/2012    OPV 07/19/1994, 09/27/1994, 11/20/1994, 04/27/1999    PPD 02/03/1995    Tdap 05/12/2006    Varivax (chicken pox vaccine) 12/09/1996, 02/26/2007          Medications:  None     Physical Exam:   Patient Vitals for the past 99 hrs:   BP Temp Pulse Resp SpO2 Weight   04/23/16 1221 124/81 97.5 F 102 18 100 % 90.7 kg (200 lb)       GENERAL:  Well-appearing, no distress.  SKIN:  Warm & Dry, no rash, no bruising.  HEENT:   Atraumatic. PERRL. EOMI,  TM are pearly grey without any  erythema or bulging, Oropharynx clear  NECK:  Supple  CHEST/PULM:  Clear to auscultation bilaterally without rales, rhonchi or wheezing.   CVS:  RRR.  No murmurs, rubs, or gallops.   ABDOMEN:  Soft, flat, without distension.  Nontender to palpation.   MUSCULOSKELETAL:  No deformities. Well-perfused extremities.   NEUROLOGIC:  CN II-XII intact, alert & oriented x 3, moves all extremities symmetrically  PSYCHIATRIC:  Normal affect, calm & cooperative    Medications Given in the ED:  Medications   ondansetron (ZOFRAN-ODT) disintegrating tablet 4 mg (not administered)    Radiology Results:  N/A   Lab Results (abnormal results only):  Labs Reviewed   CBC + PLT + AUTO DIFF   BASIC METABOLIC PANEL   HEPATIC FUNCTION PANEL   HETEROPHILE ANTIBODY   URINE PREGNANCY TEST (POINT OF CARE)    Other Results/Old Record review (e.g. ECG):  N/A     ED  Course and Medical Decision-making:  21 year old female patient here for evaluation of not feeling well over the past week.  Patient was diagnosed with a viral illness by primary care provider.  Patient appears very well here. Urine hcg is negative. CBC checked for H/H to check to make sure her fatigue was not from anemia. This was normal. Checked monospot given the sore throat and fatigue. This was pending at time of discharge. She c/o a cough, but is afebrile, no focal lung findings on exam. No hypoxia - have lower suspicion for a pneumonia at present.   Patient appears very well here. No focal abdominal tenderness to palpation. She was tolerating PO prior to getting the zofran, but reported nausea was resolved. She will be discharged home with a prescription for Zofran ODT. She is instructed on continued supportive care for viral illness and will follow up with PMD    Patient/family educated on diagnosis(es); she states understanding and agrees with plan of care.  Reasons to return to the ED were reviewed in detail. She agrees with this plan and disposition.    Condition on Discharge: Improved and Stable    Diagnosis/Diagnoses:  Cough  Generalized weakness  Nausea      Adah Salvage, MD  [facility]  Attending Physician  Department of Emergency Medicine  This Emergency Department patient encounter note was created using voice-recognition software and in real time during the ED visit. Please excuse any typographical errors that have not been edited out.

## 2016-04-23 NOTE — Discharge Instructions (Signed)
DIAGNOSIS OR DIAGNOSES:  You were seen in the emergency department today for nausea, weakness, cough.   It is possible you may still have a viral illness.       NEXT STEPS:    - Follow up with your primary care provider as soon as possible  - Drink plenty of fluids to stay hydrated  - Alternate doses of Tylenol (acetaminophen) with Motrin/Advil (ibuprofen) to control fever/pain.  Wait 6 hours between the SAME kind of medication.          RETURN IMMEDIATELY TO THE EMERGENCY DEPARTMENT FOR:   Any worsening symptoms or further concerns

## 2016-04-23 NOTE — Narrator Note (Signed)
Patient Disposition    Patient education for diagnosis, medications, activity, diet and follow-up.  Patient left ED 2:03 PM.  Patient rep received written instructions.  Interpreter to provide instructions: No    Patient belongings with patient: YES    Have all existing LDAs been addressed? N/A    Have all IV infusions been stopped? N/A    Discharged to: Discharged to home

## 2016-04-23 NOTE — ED Triage Note (Signed)
Cough, fatigue and nausea for the past week, seen by her PCP and told she had a virus, seen at urgent care as she was not feeling better and nausea has not gone away and today she said she vomited one time.

## 2016-04-23 NOTE — ED Patient Condition Note (Signed)
Patient condition: Stable

## 2016-04-24 LAB — BASIC METABOLIC PANEL
ANION GAP: 6 mmol/L (ref 5–15)
BUN (UREA NITROGEN): 5 mg/dL — ABNORMAL LOW (ref 7–18)
CALCIUM: 8.8 mg/dL (ref 8.5–10.1)
CARBON DIOXIDE: 29 mmol/L (ref 21–32)
CHLORIDE: 107 mmol/L (ref 98–107)
CREATININE: 0.5 mg/dL (ref 0.4–1.2)
ESTIMATED GLOMERULAR FILT RATE: >60 mL/min (ref >60)
Glucose Random: 94 mg/dL (ref 74–160)
POTASSIUM: 3.9 mmol/L (ref 3.5–5.1)
SODIUM: 142 mmol/L (ref 136–145)

## 2016-04-24 LAB — HETEROPHILE ANTIBODY: HETEROPHILE ANTIBODY: NEGATIVE

## 2016-05-07 ENCOUNTER — Encounter (HOSPITAL_BASED_OUTPATIENT_CLINIC_OR_DEPARTMENT_OTHER): Payer: Self-pay | Admitting: Family Medicine

## 2016-05-31 DIAGNOSIS — S233XXA Sprain of ligaments of thoracic spine, initial encounter: Secondary | ICD-10-CM | POA: Diagnosis present

## 2016-05-31 DIAGNOSIS — S335XXA Sprain of ligaments of lumbar spine, initial encounter: Secondary | ICD-10-CM | POA: Diagnosis present

## 2016-06-24 ENCOUNTER — Encounter (HOSPITAL_BASED_OUTPATIENT_CLINIC_OR_DEPARTMENT_OTHER): Payer: Self-pay | Admitting: Family Medicine

## 2016-06-24 DIAGNOSIS — H547 Unspecified visual loss: Secondary | ICD-10-CM

## 2016-06-25 NOTE — Telephone Encounter (Signed)
Referral placed.  Lennyn Gange, PA-C

## 2016-07-12 ENCOUNTER — Encounter (HOSPITAL_BASED_OUTPATIENT_CLINIC_OR_DEPARTMENT_OTHER): Payer: Self-pay | Admitting: Family Medicine

## 2016-10-02 ENCOUNTER — Telehealth (HOSPITAL_BASED_OUTPATIENT_CLINIC_OR_DEPARTMENT_OTHER): Payer: Self-pay | Admitting: Family Medicine

## 2016-10-02 ENCOUNTER — Ambulatory Visit (HOSPITAL_BASED_OUTPATIENT_CLINIC_OR_DEPARTMENT_OTHER): Payer: 59 | Admitting: Family Medicine

## 2016-10-02 ENCOUNTER — Encounter (HOSPITAL_BASED_OUTPATIENT_CLINIC_OR_DEPARTMENT_OTHER): Payer: Self-pay | Admitting: Family Medicine

## 2016-10-02 VITALS — BP 100/70 | HR 109 | Wt 207.0 lb

## 2016-10-02 DIAGNOSIS — R87612 Low grade squamous intraepithelial lesion on cytologic smear of cervix (LGSIL): Secondary | ICD-10-CM | POA: Diagnosis not present

## 2016-10-02 DIAGNOSIS — Z0000 Encounter for general adult medical examination without abnormal findings: Secondary | ICD-10-CM | POA: Diagnosis not present

## 2016-10-02 DIAGNOSIS — R109 Unspecified abdominal pain: Secondary | ICD-10-CM | POA: Diagnosis not present

## 2016-10-02 DIAGNOSIS — Z124 Encounter for screening for malignant neoplasm of cervix: Secondary | ICD-10-CM | POA: Diagnosis not present

## 2016-10-02 LAB — CBC WITH PLATELET
HEMATOCRIT: 42.3 % (ref 34.1–44.9)
HEMOGLOBIN: 14.5 g/dL (ref 11.2–15.7)
MEAN CORP HGB CONC: 34.3 g/dL (ref 31.0–37.0)
MEAN CORPUSCULAR HGB: 30.7 pg (ref 26.0–34.0)
MEAN CORPUSCULAR VOL: 89.6 fL (ref 80.0–100.0)
MEAN PLATELET VOLUME: 9.6 fL (ref 8.7–12.5)
PLATELET COUNT: 242 10*3/uL (ref 150–400)
RBC DISTRIBUTION WIDTH STD DEV: 42.2 fL (ref 35.1–46.3)
RBC DISTRIBUTION WIDTH: 13 % (ref 11.5–14.3)
RED BLOOD CELL COUNT: 4.72 M/uL (ref 3.90–5.20)
WHITE BLOOD CELL COUNT: 7.7 10*3/uL (ref 4.0–11.0)

## 2016-10-02 NOTE — Progress Notes (Signed)
Mickel Baas from [facility] called the Central Refill Department to complete a benefit analysis for the TDAP Vaccine.    The vaccine is covered under the patients [place] w/ [facility] medical coverage.    Please choose [phone] TDAP (Private)    If you do not have the vaccine in stock, please contact the [facility] department [phone] to have the vaccine delivered to your clinic

## 2016-10-02 NOTE — Progress Notes (Signed)
Chief Complaint:  Dawn Merritt is a 21 year old female who presents for a physical exam.  Patient has other current concerns of  Intermittent gi upset, wakes up with nausea in the morning, and some foods leave her with belly discomfort, sometimes diarrhea and sx relief after stooling.  Not sure of triggers.  Has not tried anything to alleviate sx, pasta and [place] food seem to make it worse.  No trouble with half and half in coffee, not sure about dairy as consumes little of it.  Has always had "nervous stomach" but seems to be worse now.    Never vomited.  More trouble with lower abdomen, but early morning feels upper chest discomfort "like I would vomit if I ate" but after a few hours feels fine.    Also notes intermittent swelling and painful area on left lab maj, better if she uses warm compresses.    I have reviewed the patient's medical history in detail and updated the computerized patient record.  Patient Active Problem List    Low grade squamous intraepith lesion on cytologic smear cervix (lgsil)         Date Noted: 10/16/2015            Age 17, first pap - plan repeat cytology 12 months      Peripheral retinal degeneration, lattice         Date Noted: 10/03/2008            Noted by optometrist, confirmed by ophthalmologist            Dr. Ayesha Rumpf, [facility] -             referred to retinal specialist            No holes/tears 6/16 ([place])      BMI 35.0-35.9,adult         Date Noted: 11/14/2004      Family history of diabetes mellitus         Date Noted: 11/14/2004        No current outpatient prescriptions on file prior to visit.  No current facility-administered medications on file prior to visit.   Allergies: Patient has no known allergies.  Immunization History   Administered Date(s) Administered    DTP 07/19/1994, 09/27/1994, 11/20/1994, 11/03/1995, 04/27/1999    HIB 4 Dose Schedule (PRP-T) 07/19/1994, 09/27/1994, 11/20/1994, 07/24/1995    HPV-4 (3 DOSE) 02/26/2007, 04/29/2007, 08/31/2007    Hep B  Pedi/Adol 3 Dose Less than age 72 Dec 16, 1994, 06/06/1994, 02/03/1995    INFLUENZA VIRUS TRI W/PRESV VACCINE 18/> YRS IM (PRIVATE) 02/06/2009    MMR 07/24/1995, 04/27/1999    Menactra (Meningococcal) 02/06/2006, 05/14/2012    OPV 07/19/1994, 09/27/1994, 11/20/1994, 04/27/1999    PPD 02/03/1995    Tdap 05/12/2006    Varivax (chicken pox vaccine) 12/09/1996, 02/26/2007     No past medical history on file.  Past Surgical History:  No date: NO SIGNIFICANT SURGICAL HISTORY    Social History  Social History   Marital status: Single  Spouse name: N/A    Years of education: N/A  Number of children: N/A     Occupational History  None on file     Social History Main Topics   Smoking status: Passive Smoke Exposure - Never Smoker     Smokeless tobacco: Not on file    Comment: dad smoked in house    Alcohol use Yes    Comment: occasional  Drug use: No    Sexual activity: Yes    Partners: Male    Birth control/ protection: Condom     Other Topics Concern   None on file     Social History Narrative    08/2014 lives with parents in [place].        Family History    Diabetes Maternal Grandmother     Comment: and numerous other fmaily members/    Asthma Paternal Uncle     Asthma Father     Comment: very mild    Heart Maternal Grandmother     Comment: CAD    Cancer - Other Maternal Grandmother     Comment: renal    Cancer - Breast FamHxNeg     Cancer - Colon FamHxNeg     Alcohol/Drug Abuse Maternal Grandfather     Comment: alocholism    Cancer - Ovarian FamHxNeg        Review of Systems:                   Skin: negative  Eyes: negative  Ears/Nose/Throat: negative  Respiratory: negative  Cardiovascular: negative  Gastrointestinal: per HPI  Genitourinary: negative  YQI:HKVQQV menses, no abnormal bleeding, pelvic pain or discharge,no breast pain or new or enlarging lumps on self exam  Musculoskeletal: negative  Neurologic: negative  Endocrine: negative  Psychiatric:  negative  Hematologic/Lymphatic/Immunologic: negative    Physical:  BP 100/70 (Site: LA, Position: Sitting, Cuff Size: Lrg)  Pulse 109  Wt 93.9 kg (207 lb)  LMP 09/25/2016  SpO2 100%  BMI 35.53 kg/m2  General appearance: healthy, alert, well developed, well nourished  Eyes: negative  Skin: skin color, texture, turgor are normal  Head: Normocephalic. No masses, lesions, tenderness or abnormalities  Ears: External ears normal. Canals clear. TM's normal.  Nose/Sinuses: negative  Oropharynx: Lips, mucosa, and tongue normal. Teeth and gums normal. Oropharynx moist and without lesion  Neck: Neck supple. No adenopathy. Thyroid symmetric, normal size, and without nodularity  Back: Back symmetric, no curvature. ROM normal. No CVA tenderness.  Lungs: negative findings: chest symmetric with normal A/P diameter, no chest deformities noted, normal respiratory rate and rhythm, lungs clear to auscultation  Heart: negative findings: regular rate and rhythm, S1 normal, S2 normal, no murmurs, clicks, or gallops  Abdomen: Abdomen soft, non-tender. BS normal. No masses, no organomegaly  Extremities: Extremities normal. No deformities, edema, or skin discoloration  Musculoskeletal: Spine ROM normal. Muscular strength intact.  Peripheral pulses: negative  Neuro: mental status intact, cranial nerves 2-12 intact, muscle tone normal, normal gait   Pelvic: negative findings: external genitalia normal, 4 mm white papule left LM consistent with largemilium, nontender, no drainage.  Bartholin's glands, urethra, Skene's glands negative, vaginal mucosa normal, cervix clear, no CMT  Rectal: deferred    Health Counseling:  Smoking:  Reviewed and Discussed  Substance Use Issues:   Reviewed and Discussed   Diet, Exercise, Wt. Control:  Reviewed and Discussed  Dental Health:  Reviewed and Discussed  Vision Health:  Reviewed and Discussed  Mental Health:  Reviewed and Discussed  Domestic Violence:  Reviewed and Discussed  Sexual Health:  Reviewed and  Discussed    ASSESSMENT/PLAN:  (Z00.00) Routine general medical examination at a health care facility  (primary encounter diagnosis)  Comment: Due for routine well exam and age and risk based screening and counseling   Plan: AMPLIFIED GENPROBE CHLAM/GC, HIV ANTIGEN         ANTIBODY, CYTP CERV/VAG  AUTO THIN LAYER PREP         MNL SCREEN, OBTAINING SCREEN PAP SMEAR, HUMAN         PAPILLOMAVIRUS (HPV)            (R10.9) Abdominal pain, unspecified abdominal location  Comment: long term problem, comes and goes.  Morning nausea is mild, lmp 1 week ago so not that.  Important to rule out celiac however may also be irritable bowel/functional bowel symptoms.  Plan: T-TRANSGLUTAMINASE IGA, CBC WITH PLATELET        Suggested she try GI probiotics such as active TB or supplements from whole foods or other holistic market.  Reviewed option for elimination diet she is not interested in that at this time.  Could also consider trial of at bedtime ranitidine for a.m. nausea.  She is not so troubled by it will hold off on that as well.  Call as needed worsening symptoms    (R87.612) Low grade squamous intraepith lesion on cytologic smear cervix (lgsil)  Comment: Last year isolated L SIL and women under age of 55 due for repeat testing  Plan: Further plan pending results    We discussed the patient's diagnoses and the importance of medication compliance. The patient was ready to learn and no apparent learning barriers were identified. I explained the diagnosis and treatment plan, and the patient expressed understanding of the content. Possible side effects of the prescribed medication(s) were explained, including non-.  I attempted to answer any questions regarding the diagnosis and the proposed treatment.      Marland Kitchen    Marland Kitchen

## 2016-10-02 NOTE — Patient Instructions (Signed)
Consider trying probiotics ([employer] or [employer]) OR Activia yogurt, Kefir, to see if this helps your digestion.

## 2016-10-02 NOTE — Telephone Encounter (Signed)
-----   Message from Eda Paschal sent at 10/02/2016  8:14 AM EDT -----  To  Whom it may concern,  Please do a Benefits Analysis for a Tdap and sent message to RN pool,.    Thank you,    Eda Paschal

## 2016-10-03 LAB — HIV ANTIGEN ANTIBODY: HIV 1/2 PLUS O AG/AB SCREEN: NONREACTIVE

## 2016-10-04 LAB — CHLAMYDIA GC NAAT
GENPROBE CHLAMYDIA: NEGATIVE
GENPROBE GC: NEGATIVE

## 2016-10-04 LAB — HUMAN PAPILLOMAVIRUS (HPV)
HUMAN PAPILLOMAVIRUS: HIGH — AB
HUMAN PAPILLOMAVIRUS: POSITIVE

## 2016-10-04 LAB — T-TRANSGLUTAMINASE IGA: t-TRANSGLUTAMINASE IGA: <2 U/mL (ref 0–3)

## 2016-10-07 ENCOUNTER — Encounter (HOSPITAL_BASED_OUTPATIENT_CLINIC_OR_DEPARTMENT_OTHER): Payer: Self-pay | Admitting: Family Medicine

## 2016-10-07 LAB — CYTOPATH, C/V, THIN LAYER

## 2016-10-21 ENCOUNTER — Ambulatory Visit (HOSPITAL_BASED_OUTPATIENT_CLINIC_OR_DEPARTMENT_OTHER): Payer: 59 | Admitting: Family Medicine

## 2016-10-21 ENCOUNTER — Encounter (HOSPITAL_BASED_OUTPATIENT_CLINIC_OR_DEPARTMENT_OTHER): Payer: Self-pay | Admitting: Family Medicine

## 2016-10-21 VITALS — BP 110/60 | Temp 97.3°F

## 2016-10-21 DIAGNOSIS — R87810 Cervical high risk human papillomavirus (HPV) DNA test positive: Secondary | ICD-10-CM

## 2016-10-21 DIAGNOSIS — R87612 Low grade squamous intraepithelial lesion on cytologic smear of cervix (LGSIL): Secondary | ICD-10-CM

## 2016-10-21 LAB — URINE PREGNANCY TEST (POINT OF CARE): HCG QUALITATIVE URINE: NEGATIVE

## 2016-10-21 NOTE — Progress Notes (Signed)
.      PATIENT/PROCEDURE VERIFICATION DOCUMENTATION    Correct patient: Yes  Correct procedure: Yes  Correct side, site, mark visible if applicable: Yes  Correct position: Yes  Special equipment/implant(s) present, if applicable: Yes    Time-out completed, documented by provider doing procedure or designated team member:  [name] MD    10/21/2016    56:31 PM  21 year old female presents for colposcopy  Patient's last menstrual period was 09/25/2016.     PCP: [name] MD    Patient pregnant? 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    PROCEDURE:  The cervix was visualized and swabbed with 3% acetic acid.  The transformation zone was completely visualized.    Cervix: visible lesion(s) at 12 o'clock wARTY IN appearance and acetowhite lesion(s) noted at 9 o'clock    Pap smear: Not obtained    ECC: Done    Biopsy: Done at 9 and 12 o'clock    CLINICAL IMPRESSION: HPV related changes  Examination was satisfactory    PATIENT EDUCATION:  The following were discussed, or previous discussion reinforced:   the short and long term course, follow-up, and treatment of cervical dysplasia   the epidemiology of HPV infection   the availability of vaccination   the vital importance of continued surveillance, per protocol    POST PAIN ASSESSMENT:  Post pain assessment done. Patient rates pain as a 3 on a 0-10 pain scale.    Estimated blood loss: 2cc    DISPOSITION:  the patient will be contacted with results.

## 2016-10-24 DIAGNOSIS — H5213 Myopia, bilateral: Secondary | ICD-10-CM | POA: Diagnosis not present

## 2016-10-24 LAB — SURGICAL PATH SPECIMEN

## 2016-10-25 ENCOUNTER — Encounter (HOSPITAL_BASED_OUTPATIENT_CLINIC_OR_DEPARTMENT_OTHER): Payer: Self-pay | Admitting: Family Medicine

## 2016-11-13 ENCOUNTER — Encounter (HOSPITAL_BASED_OUTPATIENT_CLINIC_OR_DEPARTMENT_OTHER): Payer: Self-pay | Admitting: Ambulatory Care

## 2016-11-13 NOTE — Progress Notes (Unsigned)
Abn Cancer Screen Tracking PAP  PAP smear date:  10/02/16  Colpo: 10/21/16  HPV:  positive  Patient notification date: 10/07/16  Mode of notification: mychart  Provider recommendation for care/cotest: colpo done 10/21/16 one year PAP  HM and PL updated to reflect current plan. yes  Outreach will be made at appropriate time.     Edwina Barth, RN

## 2016-12-03 ENCOUNTER — Inpatient Hospital Stay (HOSPITAL_COMMUNITY)
Admission: AD | Admit: 2016-12-03 | Discharge: 2016-12-03 | Disposition: A | Discharge status: Home / Self Care | Payer: Medicaid Other | Source: Ambulatory Visit | Attending: Family Medicine | Admitting: Family Medicine

## 2016-12-03 ENCOUNTER — Inpatient Hospital Stay (HOSPITAL_COMMUNITY): Payer: Medicaid Other

## 2016-12-03 ENCOUNTER — Encounter (HOSPITAL_COMMUNITY): Payer: Self-pay

## 2016-12-03 DIAGNOSIS — R109 Unspecified abdominal pain: Secondary | ICD-10-CM | POA: Insufficient documentation

## 2016-12-03 DIAGNOSIS — O3481 Maternal care for other abnormalities of pelvic organs, first trimester: Secondary | ICD-10-CM

## 2016-12-03 DIAGNOSIS — O26891 Other specified pregnancy related conditions, first trimester: Secondary | ICD-10-CM | POA: Insufficient documentation

## 2016-12-03 DIAGNOSIS — Z3A01 Less than 8 weeks gestation of pregnancy: Secondary | ICD-10-CM | POA: Diagnosis not present

## 2016-12-03 DIAGNOSIS — O99611 Diseases of the digestive system complicating pregnancy, first trimester: Secondary | ICD-10-CM | POA: Insufficient documentation

## 2016-12-03 DIAGNOSIS — K5900 Constipation, unspecified: Secondary | ICD-10-CM | POA: Diagnosis not present

## 2016-12-03 DIAGNOSIS — N83292 Other ovarian cyst, left side: Secondary | ICD-10-CM | POA: Diagnosis not present

## 2016-12-03 DIAGNOSIS — N83209 Unspecified ovarian cyst, unspecified side: Secondary | ICD-10-CM

## 2016-12-03 DIAGNOSIS — Z3491 Encounter for supervision of normal pregnancy, unspecified, first trimester: Secondary | ICD-10-CM

## 2016-12-03 LAB — POCT PREGNANCY, URINE: PREG TEST UR: POSITIVE — AB

## 2016-12-03 LAB — WET PREP, GENITAL
Sperm: NONE SEEN
TRICH WET PREP: NONE SEEN
YEAST WET PREP: NONE SEEN

## 2016-12-03 LAB — CBC
HCT: 35.4 % — ABNORMAL LOW (ref 36.0–46.0)
Hemoglobin: 12.5 g/dL (ref 12.0–15.0)
MCH: 29.9 pg (ref 26.0–34.0)
MCHC: 35.3 g/dL (ref 30.0–36.0)
MCV: 84.7 fL (ref 78.0–100.0)
PLATELETS: 179 10*3/uL (ref 150–400)
RBC: 4.18 MIL/uL (ref 3.87–5.11)
RDW: 13.3 % (ref 11.5–15.5)
WBC: 11.4 10*3/uL — AB (ref 4.0–10.5)

## 2016-12-03 LAB — HIV ANTIBODY (ROUTINE TESTING W REFLEX): HIV Screen 4th Generation wRfx: NONREACTIVE

## 2016-12-03 LAB — URINALYSIS, ROUTINE W REFLEX MICROSCOPIC
BILIRUBIN URINE: NEGATIVE
Glucose, UA: NEGATIVE mg/dL
Hgb urine dipstick: NEGATIVE
KETONES UR: NEGATIVE mg/dL
Nitrite: NEGATIVE
Protein, ur: NEGATIVE mg/dL
SPECIFIC GRAVITY, URINE: 1.02 (ref 1.005–1.030)
pH: 8 (ref 5.0–8.0)

## 2016-12-03 LAB — ABO/RH: ABO/RH(D): A POS

## 2016-12-03 LAB — HCG, QUANTITATIVE, PREGNANCY: hCG, Beta Chain, Quant, S: 17270 m[IU]/mL — ABNORMAL HIGH (ref <5)

## 2016-12-03 LAB — GC/CHLAMYDIA PROBE AMP (~~LOC~~) NOT AT ARMC
Chlamydia: NEGATIVE
Neisseria Gonorrhea: NEGATIVE

## 2016-12-03 MED ORDER — PROMETHAZINE HCL 25 MG PO TABS: 12.5000 mg | ORAL_TABLET | Freq: Four times a day (QID) | ORAL | 2 refills | Status: DC | PRN

## 2016-12-03 NOTE — MAU Provider Note (Signed)
Chief Complaint: Abdominal Pain   First Provider Initiated Contact with Patient 12/03/16 0136      SUBJECTIVE HPI: Emily Levine is a 21 y.o. G1P0 at [redacted]w[redacted]d by LMP who presents to maternity admissions via EMS for lower abdominal cramping pain starting today. Her pain is associated with nausea and vomiting x 1.  She reports she had an episode where she felt hot and flushed, followed by vomiting but those symptoms have resolved. Her pain is centrally located in her low abdomen, constant, and does not radiate. She has not tried anything for pain. She reports her last bowel movement was 1 week ago.  She denies vaginal bleeding, vaginal itching/burning, urinary symptoms, h/a, dizziness, n/v, or fever/chills.     HPI  No past medical history on file. No past surgical history on file. Social History   Social History  . Marital status: Single    Spouse name: N/A  . Number of children: N/A  . Years of education: N/A   Occupational History  . Not on file.   Social History Main Topics  . Smoking status: Never Smoker  . Smokeless tobacco: Never Used  . Alcohol use No  . Drug use: Yes    Types: Marijuana  . Sexual activity: Not on file   Other Topics Concern  . Not on file   Social History Narrative  . No narrative on file   No current facility-administered medications on file prior to encounter.    No current outpatient prescriptions on file prior to encounter.   No Known Allergies  ROS:  Review of Systems  Constitutional: Negative for chills, fatigue and fever.  Respiratory: Negative for shortness of breath.   Cardiovascular: Negative for chest pain.  Gastrointestinal: Positive for abdominal pain, constipation, nausea and vomiting. Negative for diarrhea.  Genitourinary: Positive for pelvic pain. Negative for difficulty urinating, dysuria, flank pain, vaginal bleeding, vaginal discharge and vaginal pain.  Neurological: Negative for dizziness and headaches.   Psychiatric/Behavioral: Negative.      I have reviewed patient's Past Medical Hx, Surgical Hx, Family Hx, Social Hx, medications and allergies.   Physical Exam   Patient Vitals for the past 24 hrs:  BP Temp Temp src Pulse Resp  12/03/16 0345 (!) 100/50 - - - -  12/03/16 0108 (!) 102/59 98 F (36.7 C) Oral 64 18   Constitutional: Well-developed, well-nourished female in no acute distress.  Cardiovascular: normal rate Respiratory: normal effort GI: Abd soft, non-tender. Pos BS x 4 MS: Extremities nontender, no edema, normal ROM Neurologic: Alert and oriented x 4.  GU: Neg CVAT.  PELVIC EXAM: Cervix pink, visually closed, without lesion, scant white creamy discharge, vaginal walls and external genitalia normal Bimanual exam: Cervix 0/long/high, firm, anterior, neg CMT, uterus nontender, nonenlarged, adnexa without tenderness, enlargement, or mass   LAB RESULTS Results for orders placed or performed during the hospital encounter of 12/03/16 (from the past 24 hour(s))  Urinalysis, Routine w reflex microscopic     Status: Abnormal   Collection Time: 12/03/16  1:15 AM  Result Value Ref Range   Color, Urine YELLOW YELLOW   APPearance HAZY (A) CLEAR   Specific Gravity, Urine 1.020 1.005 - 1.030   pH 8.0 5.0 - 8.0   Glucose, UA NEGATIVE NEGATIVE mg/dL   Hgb urine dipstick NEGATIVE NEGATIVE   Bilirubin Urine NEGATIVE NEGATIVE   Ketones, ur NEGATIVE NEGATIVE mg/dL   Protein, ur NEGATIVE NEGATIVE mg/dL   Nitrite NEGATIVE NEGATIVE   Leukocytes, UA SMALL (A) NEGATIVE  RBC / HPF 0-5 0 - 5 RBC/hpf   WBC, UA 0-5 0 - 5 WBC/hpf   Bacteria, UA RARE (A) NONE SEEN   Squamous Epithelial / LPF 6-30 (A) NONE SEEN   Mucus PRESENT   Pregnancy, urine POC     Status: Abnormal   Collection Time: 12/03/16  1:22 AM  Result Value Ref Range   Preg Test, Ur POSITIVE (A) NEGATIVE  Wet prep, genital     Status: Abnormal   Collection Time: 12/03/16  1:45 AM  Result Value Ref Range   Yeast Wet Prep  HPF POC NONE SEEN NONE SEEN   Trich, Wet Prep NONE SEEN NONE SEEN   Clue Cells Wet Prep HPF POC PRESENT (A) NONE SEEN   WBC, Wet Prep HPF POC MODERATE (A) NONE SEEN   Sperm NONE SEEN   CBC     Status: Abnormal   Collection Time: 12/03/16  1:59 AM  Result Value Ref Range   WBC 11.4 (H) 4.0 - 10.5 K/uL   RBC 4.18 3.87 - 5.11 MIL/uL   Hemoglobin 12.5 12.0 - 15.0 g/dL   HCT 40.9 (L) 81.1 - 91.4 %   MCV 84.7 78.0 - 100.0 fL   MCH 29.9 26.0 - 34.0 pg   MCHC 35.3 30.0 - 36.0 g/dL   RDW 78.2 95.6 - 21.3 %   Platelets 179 150 - 400 K/uL  hCG, quantitative, pregnancy     Status: Abnormal   Collection Time: 12/03/16  1:59 AM  Result Value Ref Range   hCG, Beta Chain, Quant, S 17,270 (H) <5 mIU/mL  ABO/Rh     Status: None   Collection Time: 12/03/16  1:59 AM  Result Value Ref Range   ABO/RH(D) A POS     --/--/A POS (09/25 0159)  IMAGING US Ob Comp Less 14 Wks  Result Date: 12/03/2016 CLINICAL DATA:  Pregnant patient in first-trimester pregnancy with abdominal pain. EXAM: OBSTETRIC <14 WK [place] AND TRANSVAGINAL OB US TECHNIQUE: Both transabdominal and transvaginal ultrasound examinations were performed for complete evaluation of the gestation as well as the maternal uterus, adnexal regions, and pelvic cul-de-sac. Transvaginal technique was performed to assess early pregnancy. COMPARISON:  None. FINDINGS: Intrauterine gestational sac: Single. Yolk sac:  Visualized. Embryo:  Not Visualized. Cardiac Activity: Not Visualized. MSD: 11  mm   5 w   6  d Subchorionic hemorrhage:  None visualized. Maternal uterus/adnexae: Right ovary measures 3.2 x 2.9 x 2.8 cm and contains an echogenic 1.5 cm structure. Blood flow is noted. The left ovary measures 4.5 x 3.4 x 3.5 cm and contains a 3.2 cm simple cyst. Blood flow is noted. No pelvic free fluid. IMPRESSION: 1. Probable early intrauterine gestational sac containing a yolk sac, but no fetal pole or cardiac activity yet visualized. Recommend follow-up quantitative  B-HCG levels and follow-up US in 10 days to assess viability. This recommendation follows SRU consensus guidelines: Diagnostic Criteria for Nonviable Pregnancy Early in the First Trimester. Malva Limes Med 2013; 086:5784-69. 2. 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 Simple 3.2 cm cyst in the left ovary. Blood flow is noted to both ovaries. Electronically Signed   By: Rubye Oaks M.D.   On: 12/03/2016 03:06   US Ob Transvaginal  Result Date: 12/03/2016 CLINICAL DATA:  Pregnant patient in first-trimester pregnancy with abdominal pain. EXAM: OBSTETRIC <14 WK [place] AND TRANSVAGINAL OB US TECHNIQUE: Both transabdominal and transvaginal ultrasound examinations  were performed for complete evaluation of the gestation as well as the maternal uterus, adnexal regions, and pelvic cul-de-sac. Transvaginal technique was performed to assess early pregnancy. COMPARISON:  None. FINDINGS: Intrauterine gestational sac: Single. Yolk sac:  Visualized. Embryo:  Not Visualized. Cardiac Activity: Not Visualized. MSD: 11  mm   5 w   6  d Subchorionic hemorrhage:  None visualized. Maternal uterus/adnexae: Right ovary measures 3.2 x 2.9 x 2.8 cm and contains an echogenic 1.5 cm structure. Blood flow is noted. The left ovary measures 4.5 x 3.4 x 3.5 cm and contains a 3.2 cm simple cyst. Blood flow is noted. No pelvic free fluid. IMPRESSION: 1. Probable early intrauterine gestational sac containing a yolk sac, but no fetal pole or cardiac activity yet visualized. Recommend follow-up quantitative B-HCG levels and follow-up US in 10 days to assess viability. This recommendation follows SRU consensus guidelines: Diagnostic Criteria for Nonviable Pregnancy Early in the First Trimester. Malva Limes Med 2013; 213:0865-78. 2. 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 Simple 3.2 cm cyst in the left ovary.  Blood flow is noted to both ovaries. Electronically Signed   By: Rubye Oaks M.D.   On: 12/03/2016 03:06    MAU Management/MDM: Ordered labs and [place] and reviewed results.  IUP noted on today's [place].  Discussed [place] results and possible dermoid cyst with pt.  Constipation also likely cause for pt pain. Pt given list of safe OTC medications and recommended Colace and Miralax daily.  F/U with early prenatal care, given list of providers. Pt stable at time of discharge.  ASSESSMENT 1. Normal IUP (intrauterine pregnancy) on prenatal ultrasound, first trimester   2. Abdominal pain during pregnancy in first trimester   3. Ovarian cyst affecting pregnancy in first trimester, antepartum   4. Constipation during pregnancy in first trimester     PLAN Discharge home Allergies as of 12/03/2016   No Known Allergies     Medication List    STOP taking these medications   ibuprofen 600 MG tablet Commonly known as:  ADVIL,MOTRIN     TAKE these medications   promethazine 25 MG tablet Commonly known as:  PHENERGAN Take 0.5-1 tablets (12.5-25 mg total) by mouth every 6 (six) hours as needed.            Discharge Care Instructions        Start     Ordered   12/03/16 0000  Discharge patient    Question Answer Comment  Discharge disposition 01-Home or Self Care   Discharge patient date 12/03/2016      12/03/16 0326   12/03/16 0000  promethazine (PHENERGAN) 25 MG tablet  Every 6 hours PRN    Question:  Supervising Provider  Answer:  Reva Bores   12/03/16 4696     Follow-up Information    Department, [facility] Follow up.   Why:  Or prenatal provider of your choice, see list provided.  Return to MAU as needed for emergencies. Contact information: [address] [place] [address] [phone]           Sharen Counter Certified Nurse-Midwife 12/03/2016  6:31 AM

## 2016-12-03 NOTE — Discharge Instructions (Signed)
[facility]  ° ° °[facility]       Phone: [phone] ° °[facility] Phone: [phone] ° °[facility]  Phone: [phone] ° °[facility]  Phone: [phone] ° °[facility]  Phone: [phone] ° °[facility]       Phone: [phone] ° °[facility]    Phone: [phone]  ° °[facility]    Phone: [phone] ° °[facility]    Phone: [phone] ° °[facility]    Phone: [phone] ° °[facility]    Phone: [phone] ° °[facility]       Phone: [phone]  ° °[facility]  Phone: [phone] ° °[facility]    Phone: [phone] ° °[facility]   Phone: [phone] ° °[facility]      Phone: [phone] ° °[facility]    Phone: [phone] ° °

## 2016-12-03 NOTE — MAU Note (Signed)
Pt presents to MAU via EMS with c/o lower abdominal pain that started around midnight. [facility] Pt denies vaginal bleeding. Pt has not had bowel movement in 1 week.

## 2016-12-15 ENCOUNTER — Emergency Department (HOSPITAL_BASED_OUTPATIENT_CLINIC_OR_DEPARTMENT_OTHER)
Admission: RE | Admit: 2016-12-15 | Disposition: A | Discharge status: Home / Self Care | Payer: Self-pay | Source: Emergency Department | Attending: Emergency Medicine | Admitting: Emergency Medicine

## 2016-12-15 ENCOUNTER — Encounter (HOSPITAL_BASED_OUTPATIENT_CLINIC_OR_DEPARTMENT_OTHER): Payer: Self-pay

## 2016-12-15 MED ORDER — LACTASE 3000 UNITS PO TABS: 1 | tablet | Freq: Three times a day (TID) | ORAL | 0 refills | 0 days | Status: AC

## 2016-12-15 MED ORDER — RANITIDINE HCL 300 MG PO CAPS: 300 mg | capsule | Freq: Every evening | 0 refills | 0 days | Status: DC

## 2016-12-15 MED ORDER — LACTASE 3000 UNITS PO TABS
1.00 | ORAL_TABLET | Freq: Three times a day (TID) | ORAL | 0 refills | Status: AC
Start: 2016-12-15 — End: 2016-12-25

## 2016-12-15 MED ORDER — RANITIDINE HCL 300 MG PO CAPS
300.0000 mg | ORAL_CAPSULE | Freq: Every evening | ORAL | 0 refills | Status: DC
Start: 2016-12-15 — End: 2016-12-31

## 2016-12-15 NOTE — Narrator Note (Signed)
Patient Disposition    Patient education for diagnosis, medications, activity, diet and follow-up.  Patient left ED 6:53 PM.  Patient rep received written instructions.  Interpreter to provide instructions: No    Patient belongings with patient: YES    Have all existing LDAs been addressed? N/A    Have all IV infusions been stopped? N/A    Discharged to: Discharged to home

## 2016-12-15 NOTE — ED Provider Notes (Signed)
[facility] Emergency Department Note    ED nursing record was reviewed. Prior records as available electronically through the Epic record were reviewed.    The patient was seen and evaluated with attending physician, Dr. Fleet Contras.     HPI:    Dawn Merritt is a 21 year old female with a PMH of chronic intermittent abdominal upset and obesity who presents to the Emergency Department with chief complaint of worsening epigastric abdominal pain in the setting of new-onset NBNB vomiting x1 and loose stools. The patient reports that she has had ongoing abdominal discomfort and nausea for the past year. She reports that beginning today at 10AM, the patient started experiencing sharp, non-radiating upper abdominal pain that she reports as both persistent and "coming and going." The pain is 4-5/10 at baseline and randomly worsens to 8/10 in severity. About 1-2 hours of pain onset, the patient had a one time episode of NBNB, "watery" emesis followed by 2 episodes of loose, NB stools. She has tolerated some peanut butter crackers and yogurt since these episodes without issue.     Upon review of medical records, the patient was last seen in outpatient clinic by Dr. [name] for chronic intermittent GI upset that presents as early morning nausea and abdominal discomfort. She endorsed ongoing diarrhea and symptom relief with stooling. The patient reports that she has always had a "nervous stomach." Recommended trial of QHS ranitidine for AM nausea (which she refused) and a trial of GI probiotics as well as an elimination diet. The patient reports trial of activia yogurt with mild to moderate improvement of her symptoms. The patient denies regular ingestion of spicy foods. She has noticed that her abdominal pain and GI upset is especially worse following consumption of sherbet or ice cream.     In addition to the above, the patient complains of upper respiratory symptoms including "off and on" cough  and congestion for which she has taken nyquil x1 the evening prior to arrival. No fevers, headache, chills, chest pain, shortness of breath, constipation, dysuria, extremity pain, new skin rashes. LMP: 11/28/16.     ROS: Pertinent positives were reviewed as per the HPI above.     Past Medical History/Problem list:  History reviewed. No pertinent past medical history.  Patient Active Problem List:     BMI 35.0-35.9,adult     Family history of diabetes mellitus     Peripheral retinal degeneration, lattice     Low grade squamous intraepith lesion on cytologic smear cervix (lgsil)    Past Surgical History: None    Immunization Status:  Immunization History   Administered Date(s) Administered    DTP 07/19/1994, 09/27/1994, 11/20/1994, 11/03/1995, 04/27/1999    HIB 4 Dose Schedule (PRP-T) 07/19/1994, 09/27/1994, 11/20/1994, 07/24/1995    HPV-4 (3 DOSE) 02/26/2007, 04/29/2007, 08/31/2007    Hep B Pedi/Adol 3 Dose Less than age 51 10-09-94, 06/06/1994, 02/03/1995    INFLUENZA VIRUS TRI W/PRESV VACCINE 18/> YRS IM (PRIVATE) 02/06/2009    MMR 07/24/1995, 04/27/1999    Menactra (Meningococcal) 02/06/2006, 05/14/2012    OPV 07/19/1994, 09/27/1994, 11/20/1994, 04/27/1999    PPD 02/03/1995    Tdap 05/12/2006    Varivax (chicken pox vaccine) 12/09/1996, 02/26/2007     Allergies: NKDA, NKFA    Medications: Multivitamins    Social History:   Lives with mom and dad in [place], [place]. 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 Infrequent consumption of alcohol.     Family History: Denies FH of GERD, IBD,  IBS, and gallbladder disease.     Physical Exam:  BP 135/83  Pulse 109  Temp 96.8 F  Resp 15  LMP 11/28/2016 (Exact Date)  SpO2 98% BP 135/83  Pulse 109  Temp 96.8 F  Resp 15  LMP 11/28/2016 (Exact Date)  SpO2 98%  GENERAL: Well-appearing, well-nourished, no acute distress, non-toxic.   SKIN:  WWP, no erythema or rash.  HEAD: MMM, PERRL. EOM grossly intact.  NECK: Shotty LAN. Supple.   LUNGS:  Clear to auscultation  bilaterally. No wheezes, rales, rhonchi.   HEART:  RR.  No murmurs.  ABDOMEN:  Soft, ND, no significant epigastric tenderness on deep palpation.  No involuntary guarding or rebound.   EXTREMITIES:  No obvious deformities.  CSM intact x 4.   Distal LEs are cool to touch.   GENITOURINARY:  No CVA tenderness.   NEUROLOGIC:  Alert and oriented; moves all extremities well; speaking in clear fluent sentences.   PSYCHIATRIC:  Appropriate for age, time of day, and situation    Results: None    Prior Results Review:   t-Transglutaminase IGA (7/25): <2  CBC (7/25): WNL     Medical Decision-making and Assessment:  Dawn Merritt is a 21 year old female with a PMH of chronic GI discomfort with nausea who presents with persistence and worsening of epigastric pain in the setting of new onset NBNB vomiting x1. Currently stable. Exam is reassuring for an overall benign abdominal pain without evidence of tenderness to deep palpation in the epigastrum. Prior labs from 7/25 were notable for a normal CBC and normal t-IGA,which makes an infectious process and celiac disease less likely.     Differential diagnosis is as follows: gastritis, GERD, lactose-intolerance and IBS. With acute onset epigastric pain, it is important to also consider and not miss gallstones and pancreatitis. The patient denies any FH of gallbladder disease and does not endorse a diet high in fat making gallbladder disease less likely. Pancreatitis is also less likely given the lack of acute tenderness upon palpation of the epigastrum. She denies radiation of the pain and endorses very infrequent alcohol use, which is a common precipitant of pancreatitis.      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     Tx: Provided with prescriptions for ranitidine and lactase and recommended follow up with  pediatrician    ED Course and Plan: Given printed aftercare instructions. Reasons to return to the emergency department discussed in detail. Family expressed understanding and agreement with this plan.     Condition: Stable   Disposition: Home    Diagnosis/Diagnoses:  Epigastric pain  Nausea and vomiting, intractability of vomiting not specified, unspecified vomiting type  ______________________________  Varney Biles, MD  [facility], PGY1 Pediatrics  Pager [phone]    Seen and Discussed with Dr. Fleet Contras, ED attending

## 2016-12-15 NOTE — ED Triage Note (Signed)
Pt reports months of intermittent sharp abd pain with episodes of n/v/ and diarrhea  Worse this AM

## 2016-12-15 NOTE — ED Provider Notes (Signed)
This 21 year old female patient was evaluated in the ED for epigastric abdominal discomfort, nausea, vomiting and diarrhea, recurrent for many months.  Symptoms usually, after patient is stressed or eats dairy products..  Initial history and physical exam information was obtained by Cordella Register, pediatric resident, who also made a detailed record of this visit. I performed a history and physical examination of the patient and discussed management with Kathrin Czepiel.    I reviewed the patient's past medical history/problem list, past surgical history, medication list, social history and allergies. I made all key diagnostic, treatment and disposition decisions.    On my direct physical exam:  BP 135/83  Pulse 109  Temp 96.8 F  Resp 15  LMP 11/28/2016 (Exact Date)  SpO2 98%  GENERAL:  WDWN, no acute distress, non-toxic   SKIN:  Warm & Dry, no rash, no petechia.  HEAD:  NCAT. Sclerae are anicteric and aninjected, B TMs clear; no hemotympani. Oropharynx is clear with moist mucous membranes.  NECK:  No C-spine tenderness, crepitus, step-off.  No meningismus.  No LAN. No stridor.  LUNGS:  Clear to auscultation bilaterally. No wheezes, rales, rhonchi.   HEART:  RRR.  No murmurs, rubs, or gallops.   ABDOMEN:  Soft, NTND.  No hepatosplenomegaly.  No masses.  No involuntary guarding or rebound.  EXTREMITIES:  No obvious deformities.  Warm and well perfused.  No cyanosis, clubbing, or edema.   GENITOURINARY:  No CVA tenderness.   NEUROLOGIC:  Alert and oriented x4; moves all extremities well; speaking in clear fluent sentences; CNsII-XII symmetrical and intact. Normal gait without ataxia; nonfocal.  PSYCHIATRIC:  Appropriate for age, time of day, and situation      Assessment:    21 year old female patient with probably GERD versus lactose intolerance versus irritable bowel syndrome.    Patient was given instruction instructions for all of the above and prescription for ranitidine.  She was advised to follow-up with  her primary care physician as needed.    Reasons to return to the ED were reviewed in detail. The patient agrees with this plan and disposition.    Diagnosis/diagnoses:  GERD      Deveron Furlong, MD      This record was generated in real time using voice recognition software. I proof-read and corrected any voice recognitions errors found. Please excuse any remaining voice recognition errors which were not detected.

## 2016-12-22 ENCOUNTER — Inpatient Hospital Stay (HOSPITAL_COMMUNITY)
Admission: AD | Admit: 2016-12-22 | Discharge: 2016-12-23 | Disposition: A | Discharge status: Home / Self Care | Payer: Medicaid Other | Source: Ambulatory Visit | Attending: Obstetrics & Gynecology | Admitting: Obstetrics & Gynecology

## 2016-12-22 DIAGNOSIS — K529 Noninfective gastroenteritis and colitis, unspecified: Secondary | ICD-10-CM | POA: Insufficient documentation

## 2016-12-22 DIAGNOSIS — Z3A09 9 weeks gestation of pregnancy: Secondary | ICD-10-CM | POA: Insufficient documentation

## 2016-12-22 DIAGNOSIS — O99611 Diseases of the digestive system complicating pregnancy, first trimester: Secondary | ICD-10-CM | POA: Insufficient documentation

## 2016-12-23 ENCOUNTER — Encounter (HOSPITAL_COMMUNITY): Payer: Self-pay | Admitting: Certified Nurse Midwife

## 2016-12-23 DIAGNOSIS — O9989 Other specified diseases and conditions complicating pregnancy, childbirth and the puerperium: Secondary | ICD-10-CM | POA: Diagnosis not present

## 2016-12-23 DIAGNOSIS — O99611 Diseases of the digestive system complicating pregnancy, first trimester: Secondary | ICD-10-CM | POA: Diagnosis not present

## 2016-12-23 DIAGNOSIS — K529 Noninfective gastroenteritis and colitis, unspecified: Secondary | ICD-10-CM | POA: Diagnosis not present

## 2016-12-23 DIAGNOSIS — R197 Diarrhea, unspecified: Secondary | ICD-10-CM | POA: Diagnosis present

## 2016-12-23 DIAGNOSIS — Z3A09 9 weeks gestation of pregnancy: Secondary | ICD-10-CM | POA: Diagnosis not present

## 2016-12-23 LAB — URINALYSIS, ROUTINE W REFLEX MICROSCOPIC
BILIRUBIN URINE: NEGATIVE
Glucose, UA: NEGATIVE mg/dL
HGB URINE DIPSTICK: NEGATIVE
Ketones, ur: 5 mg/dL — AB
Leukocytes, UA: NEGATIVE
NITRITE: NEGATIVE
PH: 7 (ref 5.0–8.0)
Protein, ur: NEGATIVE mg/dL
SPECIFIC GRAVITY, URINE: 1.015 (ref 1.005–1.030)

## 2016-12-23 MED ORDER — METOCLOPRAMIDE HCL 10 MG PO TABS: 10.0000 mg | ORAL_TABLET | Freq: Four times a day (QID) | ORAL | 1 refills | Status: DC | PRN

## 2016-12-23 MED ORDER — METOCLOPRAMIDE HCL 10 MG PO TABS
10.0000 mg | ORAL_TABLET | Freq: Once | ORAL | Status: AC
  Administered 2016-12-23: 10 mg via ORAL
  Filled 2016-12-23: qty 1

## 2016-12-23 NOTE — MAU Provider Note (Signed)
Patient Emily Levine is a  21 y.o. G1P0  at [redacted]w[redacted]d here with complaints of N/V, diarrhea and feeling "awful" since 1 pm today. States that these symptoms started suddenly; she ate [employer] last night for dinner. She denies bleeding; states that she has abdominal pain but thinks it is from the diarrhea.    Patient has not been able to see an ob-gyn yet because of her medicaid; she is concerned about the baby and came in to get checked out. She also says she has been under a lot of stress lately, and thinks that this could be a stress reaction.  History     CSN: [id number]  Arrival date and time: 12/22/16 2340   First Provider Initiated Contact with Patient 12/23/16 0029      Chief Complaint  Patient presents with  . Emesis  . Nausea  . Fever   Diarrhea   This is a new problem. The current episode started today. The problem occurs 5 to 10 times per day. The problem has been gradually improving. The stool consistency is described as watery. Associated symptoms include abdominal pain and vomiting. Nothing aggravates the symptoms. There are no known risk factors. She has tried nothing for the symptoms.  Emesis   This is a new problem. The current episode started today. The problem occurs 2 to 4 times per day. The problem has been gradually improving. The emesis has an appearance of stomach contents. There has been no fever. Associated symptoms include abdominal pain and diarrhea. Risk factors include suspect food intake. She has tried nothing for the symptoms.    OB History    Gravida Para Term Preterm AB Living   1             SAB TAB Ectopic Multiple Live Births                  History reviewed. No pertinent past medical history.  History reviewed. No pertinent surgical history.  History reviewed. No pertinent family history.  Social History  Substance Use Topics  . Smoking status: Never Smoker  . Smokeless tobacco: Never Used  . Alcohol use No    Allergies: No Known  Allergies  Prescriptions Prior to Admission  Medication Sig Dispense Refill Last Dose  . promethazine (PHENERGAN) 25 MG tablet Take 0.5-1 tablets (12.5-25 mg total) by mouth every 6 (six) hours as needed. 30 tablet 2     Review of Systems  Constitutional: Negative.   HENT: Negative.   Respiratory: Negative.   Cardiovascular: Negative.   Gastrointestinal: Positive for abdominal pain, diarrhea, nausea and vomiting.  Musculoskeletal: Negative.   Neurological: Negative.    Physical Exam   Blood pressure (!) 97/57, pulse 70, temperature 98.1 F (36.7 C), temperature source Oral, resp. rate 16, last menstrual period 10/21/2016, SpO2 100 %.  Physical Exam  Constitutional: She is oriented to person, place, and time. She appears well-developed and well-nourished.  HENT:  Head: Normocephalic.  Neck: Normal range of motion.  Respiratory: Effort normal.  GI: Soft. She exhibits no distension and no mass. There is no tenderness. There is no rebound and no guarding.  Musculoskeletal: Normal range of motion.  Neurological: She is alert and oriented to person, place, and time.  Skin: Skin is warm and dry.  Psychiatric: She has a normal mood and affect.    MAU Course  Procedures  MDM -10 mg of reglan -ginger ale -UA: negative for ketones Patient has kept down ginger ale and feels  well enough to go home. No diarrhea episodes while in MAU.  I reassured her that this was most likely a form of gastritis from food, and that it would pass in a few days.  Patient expressed relief that she is ok and that she is showing no signs of miscarriage.   Assessment and Plan   1. Gastroenteritis    2. Patient given list of ob-gyn providers; I encouraged patient to call [facility] or [facility] for earlier appt.  3. RX for Reglan given 4. Return precautions reviewed.    Charlesetta Garibaldi Ayomide Zuleta CNM 12/23/2016, 12:44 AM

## 2016-12-23 NOTE — Discharge Instructions (Signed)
Bland Diet A bland diet consists of foods that do not have a lot of fat or fiber. Foods without fat or fiber are easier for the body to digest. They are also less likely to irritate your mouth, throat, stomach, and other parts of your gastrointestinal tract. A bland diet is sometimes called a BRAT diet. What is my plan? Your health care provider or dietitian may recommend specific changes to your diet to prevent and treat your symptoms, such as:  Eating small meals often.  Cooking food until it is soft enough to chew easily.  Chewing your food well.  Drinking fluids slowly.  Not eating foods that are very spicy, sour, or fatty.  Not eating citrus fruits, such as oranges and grapefruit.  What do I need to know about this diet?  Eat a variety of foods from the bland diet food list.  Do not follow a bland diet longer than you have to.  Ask your health care provider whether you should take vitamins. What foods can I eat? Grains  Hot cereals, such as cream of wheat. Bread, crackers, or tortillas made from refined white flour. Rice. Vegetables Canned or cooked vegetables. Mashed or boiled potatoes. Fruits Bananas. Applesauce. Other types of cooked or canned fruit with the skin and seeds removed, such as canned peaches or pears. Meats and Other Protein Sources Scrambled eggs. Creamy peanut butter or other nut butters. Lean, well-cooked meats, such as chicken or fish. Tofu. Soups or broths. Dairy Low-fat dairy products, such as milk, cottage cheese, or yogurt. Beverages Water. Herbal tea. Apple juice. Sweets and Desserts Pudding. Custard. Fruit gelatin. Ice cream. Fats and Oils Mild salad dressings. Canola or olive oil. The items listed above may not be a complete list of allowed foods or beverages. Contact your dietitian for more options. What foods are not recommended? Foods and ingredients that are often not recommended include:  Spicy foods, such as hot sauce or  salsa.  Fried foods.  Sour foods, such as pickled or fermented foods.  Raw vegetables or fruits, especially citrus or berries.  Caffeinated drinks.  Alcohol.  Strongly flavored seasonings or condiments.  The items listed above may not be a complete list of foods and beverages that are not allowed. Contact your dietitian for more information. This information is not intended to replace advice given to you by your health care provider. Make sure you discuss any questions you have with your health care provider. Document Released: 06/19/2015 Document Revised: 08/03/2015 Document Reviewed: 03/09/2014 Elsevier Interactive Patient Education  2018 [employer] Food Choices to Help Relieve Diarrhea, Adult When you have diarrhea, the foods you eat and your eating habits are very important. Choosing the right foods and drinks can help:  Relieve diarrhea.  Replace lost fluids and nutrients.  Prevent dehydration.  What general guidelines should I follow? Relieving diarrhea  Choose foods with less than 2 g or .07 oz. of fiber per serving.  Limit fats to less than 8 tsp (38 g or 1.34 oz.) a day.  Avoid the following: ? Foods and beverages sweetened with high-fructose corn syrup, honey, or sugar alcohols such as xylitol, sorbitol, and mannitol. ? Foods that contain a lot of fat or sugar. ? Fried, greasy, or spicy foods. ? High-fiber grains, breads, and cereals. ? Raw fruits and vegetables.  Eat foods that are rich in probiotics. These foods include dairy products such as yogurt and fermented milk products. They help increase healthy bacteria in the stomach and intestines (gastrointestinal   tract, or GI tract).  If you have lactose intolerance, avoid dairy products. These may make your diarrhea worse.  Take medicine to help stop diarrhea (antidiarrheal medicine) only as told by your health care provider. Replacing nutrients  Eat small meals or snacks every 3-4 hours.  Eat bland  foods, such as white rice, toast, or baked potato, until your diarrhea starts to get better. Gradually reintroduce nutrient-rich foods as tolerated or as told by your health care provider. This includes: ? Well-cooked protein foods. ? Peeled, seeded, and soft-cooked fruits and vegetables. ? Low-fat dairy products.  Take vitamin and mineral supplements as told by your health care provider. Preventing dehydration   Start by sipping water or a special solution to prevent dehydration (oral rehydration solution, ORS). Urine that is clear or pale yellow means that you are getting enough fluid.  Try to drink at least 8-10 cups of fluid each day to help replace lost fluids.  You may add other liquids in addition to water, such as clear juice or decaffeinated sports drinks, as tolerated or as told by your health care provider.  Avoid drinks with caffeine, such as coffee, tea, or soft drinks.  Avoid alcohol. What foods are recommended? The items listed may not be a complete list. Talk with your health care provider about what dietary choices are best for you. Grains White rice. White, [place], or pita breads (fresh or toasted), including plain rolls, buns, or bagels. White pasta. Saltine, soda, or graham crackers. Pretzels. Low-fiber cereal. Cooked cereals made with water (such as cornmeal, farina, or cream cereals). Plain muffins. Matzo. Melba toast. Zwieback. Vegetables Potatoes (without the skin). Most well-cooked and canned vegetables without skins or seeds. Tender lettuce. Fruits Apple sauce. Fruits canned in juice. Cooked apricots, cherries, grapefruit, peaches, pears, or plums. Fresh bananas and cantaloupe. Meats and other protein foods Baked or boiled chicken. Eggs. Tofu. Fish. Seafood. Smooth nut butters. Ground or well-cooked tender beef, ham, veal, lamb, pork, or poultry. Dairy Plain yogurt, kefir, and unsweetened liquid yogurt. Lactose-free milk, buttermilk, skim milk, or soy milk.  Low-fat or nonfat hard cheese. Beverages Water. Low-calorie sports drinks. Fruit juices without pulp. Strained tomato and vegetable juices. Decaffeinated teas. Sugar-free beverages not sweetened with sugar alcohols. Oral rehydration solutions, if approved by your health care provider. Seasoning and other foods Bouillon, broth, or soups made from recommended foods. What foods are not recommended? The items listed may not be a complete list. Talk with your health care provider about what dietary choices are best for you. Grains Whole grain, whole wheat, bran, or rye breads, rolls, pastas, and crackers. Wild or brown rice. Whole grain or bran cereals. Barley. Oats and oatmeal. Corn tortillas or taco shells. Granola. Popcorn. Vegetables Raw vegetables. Fried vegetables. Cabbage, broccoli, Brussels sprouts, artichokes, baked beans, beet greens, corn, kale, legumes, peas, sweet potatoes, and yams. Potato skins. Cooked spinach and cabbage. Fruits Dried fruit, including raisins and dates. Raw fruits. Stewed or dried prunes. Canned fruits with syrup. Meat and other protein foods Fried or fatty meats. Deli meats. Chunky nut butters. Nuts and seeds. Beans and lentils. Tomasa Blase. Hot dogs. Sausage. Dairy High-fat cheeses. Whole milk, chocolate milk, and beverages made with milk, such as milk shakes. Half-and-half. Cream. sour cream. Ice cream. Beverages Caffeinated beverages (such as coffee, tea, soda, or energy drinks). Alcoholic beverages. Fruit juices with pulp. Prune juice. Soft drinks sweetened with high-fructose corn syrup or sugar alcohols. High-calorie sports drinks. Fats and oils Butter. Cream sauces. Margarine. Salad oils. Plain  salad dressings. Olives. Avocados. Mayonnaise. Sweets and desserts Sweet rolls, doughnuts, and sweet breads. Sugar-free desserts sweetened with sugar alcohols such as xylitol and sorbitol. Seasoning and other foods Honey. Hot sauce. Chili powder. Gravy. Cream-based or  milk-based soups. Pancakes and waffles. Summary  When you have diarrhea, the foods you eat and your eating habits are very important.  Make sure you get at least 8-10 cups of fluid each day, or enough to keep your urine clear or pale yellow.  Eat bland foods and gradually reintroduce healthy, nutrient-rich foods as tolerated, or as told by your health care provider.  Avoid high-fiber, fried, greasy, or spicy foods. This information is not intended to replace advice given to you by your health care provider. Make sure you discuss any questions you have with your health care provider. Document Released: 05/18/2003 Document Revised: 02/23/2016 Document Reviewed: 02/23/2016 Elsevier Interactive Patient Education  2017 [employer].

## 2016-12-23 NOTE — MAU Note (Signed)
Pt reports nausea, vomiting and watery stools that started this morning. States she has had 6 episodes of diarrhea today and vomited 2 times. Pt states it felt like she had a fever but did not check temp. Pt denies any sick contacts. Pt reports some mild lower abdominal cramping. Pt denies vaginal bleeding.

## 2016-12-30 ENCOUNTER — Encounter (HOSPITAL_BASED_OUTPATIENT_CLINIC_OR_DEPARTMENT_OTHER): Payer: Self-pay | Admitting: Family Medicine

## 2016-12-30 DIAGNOSIS — R1013 Epigastric pain: Secondary | ICD-10-CM

## 2016-12-31 MED ORDER — RANITIDINE HCL 300 MG PO CAPS
300.0000 mg | ORAL_CAPSULE | Freq: Every evening | ORAL | 0 refills | Status: DC
Start: 2016-12-31 — End: 2017-01-16

## 2016-12-31 MED ORDER — RANITIDINE HCL 300 MG PO CAPS: 300 mg | capsule | Freq: Every evening | 0 refills | 0 days | Status: DC

## 2017-01-08 ENCOUNTER — Encounter: Payer: Medicaid Other | Admitting: Advanced Practice Midwife

## 2017-01-09 ENCOUNTER — Ambulatory Visit (HOSPITAL_BASED_OUTPATIENT_CLINIC_OR_DEPARTMENT_OTHER): Payer: PRIVATE HEALTH INSURANCE | Admitting: Lab

## 2017-01-09 DIAGNOSIS — R1013 Epigastric pain: Secondary | ICD-10-CM

## 2017-01-11 LAB — HELICOBACTER PYLORI STOOL AG

## 2017-01-13 ENCOUNTER — Other Ambulatory Visit (HOSPITAL_COMMUNITY)
Admission: RE | Admit: 2017-01-13 | Discharge: 2017-01-13 | Disposition: A | Discharge status: Home / Self Care | Payer: Medicaid Other | Source: Ambulatory Visit | Attending: Advanced Practice Midwife | Admitting: Advanced Practice Midwife

## 2017-01-13 ENCOUNTER — Ambulatory Visit (INDEPENDENT_AMBULATORY_CARE_PROVIDER_SITE_OTHER): Payer: Medicaid Other | Admitting: Advanced Practice Midwife

## 2017-01-13 ENCOUNTER — Encounter: Payer: Self-pay | Admitting: Advanced Practice Midwife

## 2017-01-13 DIAGNOSIS — Z3401 Encounter for supervision of normal first pregnancy, first trimester: Secondary | ICD-10-CM | POA: Insufficient documentation

## 2017-01-13 DIAGNOSIS — Z113 Encounter for screening for infections with a predominantly sexual mode of transmission: Secondary | ICD-10-CM

## 2017-01-13 DIAGNOSIS — Z3400 Encounter for supervision of normal first pregnancy, unspecified trimester: Secondary | ICD-10-CM

## 2017-01-13 DIAGNOSIS — R87612 Low grade squamous intraepithelial lesion on cytologic smear of cervix (LGSIL): Secondary | ICD-10-CM

## 2017-01-13 DIAGNOSIS — Z124 Encounter for screening for malignant neoplasm of cervix: Secondary | ICD-10-CM | POA: Diagnosis not present

## 2017-01-13 MED ORDER — PRENATAL 27-0.8 MG PO TABS: 1.0000 | ORAL_TABLET | Freq: Every day | ORAL | 0 refills | Status: DC

## 2017-01-13 NOTE — Progress Notes (Signed)
Pt stated having cramping/knot lower abdominal. Also, during the  intercourse is painful.

## 2017-01-13 NOTE — Progress Notes (Signed)
  Subjective:    Emily Levine is being seen today for her first obstetrical visit.  This is not a planned pregnancy. She is at 6128w0d gestation. Relationship with FOB: significant other, not living together. Patient does intend to breast feed. Pregnancy history fully reviewed.  Patient reports no complaints.  Review of Systems:   Review of Systems  Constitutional: Negative.  Negative for fatigue and fever.  HENT: Negative.   Respiratory: Negative.  Negative for shortness of breath.   Cardiovascular: Negative.  Negative for chest pain.  Gastrointestinal: Negative.  Negative for abdominal pain, constipation, diarrhea, nausea and vomiting.  Genitourinary: Negative.  Negative for dysuria, vaginal bleeding and vaginal discharge.  Neurological: Negative.  Negative for dizziness and headaches.    Objective:     BP 114/65   Pulse 70   Wt 135 lb 8 oz (61.5 kg)   LMP 10/21/2016 (Approximate)   BMI 22.55 kg/m  Physical Exam  Nursing note and vitals reviewed. Constitutional: She is oriented to person, place, and time. She appears well-developed and well-nourished. No distress.  HENT:  Head: Normocephalic.  Eyes: Pupils are equal, round, and reactive to light.  Cardiovascular: Normal rate, regular rhythm and normal heart sounds.  Respiratory: Effort normal and breath sounds normal. No respiratory distress.  GI: Soft. Bowel sounds are normal. She exhibits no distension. There is no tenderness.  Neurological: She is alert and oriented to person, place, and time.  Skin: Skin is warm and dry.  Psychiatric: She has a normal mood and affect. Her behavior is normal. Judgment and thought content normal.    Pelvic exam: Cervix pink, visually closed, without lesion, scant white creamy discharge, vaginal walls and external genitalia normal Bimanual exam: Cervix 0/long/high, firm, anterior, neg CMT, uterus nontender, adnexa without tenderness, enlargement, or mass  Exam FHT: 168 bpm    Assessment:    Pregnancy: G1P0 Patient Active Problem List   Diagnosis Date Noted  . Supervision of normal first pregnancy, antepartum 01/13/2017       Plan:     -Initial labs drawn. -Prenatal vitamins. -Pap today -1st trimester screen requested -Problem list reviewed and updated. -Role of ultrasound in pregnancy discussed; fetal survey: requested. -Amniocentesis discussed: not indicated. -Follow up in 4 weeks.   Rolm BookbinderCaroline M Rushil Kimbrell CNM 01/13/2017 3:21 PM

## 2017-01-13 NOTE — Patient Instructions (Addendum)
Safe Medications in Pregnancy  ° °Acne: °Benzoyl Peroxide °Salicylic Acid ° °Backache/Headache: °Tylenol: 2 regular strength every 4 hours OR °             2 Extra strength every 6 hours ° °Colds/Coughs/Allergies: °Benadryl (alcohol free) 25 mg every 6 hours as needed °Breath right strips °Claritin °Cepacol throat lozenges °Chloraseptic throat spray °Cold-Eeze- up to three times per day °Cough drops, alcohol free °Flonase (by prescription only) °Guaifenesin °Mucinex °Robitussin DM (plain only, alcohol free) °Saline nasal spray/drops °Sudafed (pseudoephedrine) & Actifed ** use only after [redacted] weeks gestation and if you do not have high blood pressure °Tylenol °Vicks Vaporub °Zinc lozenges °Zyrtec  ° °Constipation: °Colace °Ducolax suppositories °Fleet enema °Glycerin suppositories °Metamucil °Milk of magnesia °Miralax °Senokot °Smooth move tea ° °Diarrhea: °Kaopectate °Imodium A-D ° °*NO pepto Bismol ° °Hemorrhoids: °Anusol °Anusol HC °Preparation H °Tucks ° °Indigestion: °Tums °Maalox °Mylanta °Zantac  °Pepcid ° °Insomnia: °Benadryl (alcohol free) 25mg every 6 hours as needed °Tylenol PM °Unisom, no Gelcaps ° °Leg Cramps: °Tums °MagGel ° °Nausea/Vomiting:  °Bonine °Dramamine °Emetrol °Ginger extract °Sea bands °Meclizine  °Nausea medication to take during pregnancy:  °Unisom (doxylamine succinate 25 mg tablets) Take one tablet daily at bedtime. If symptoms are not adequately controlled, the dose can be increased to a maximum recommended dose of two tablets daily (1/2 tablet in the morning, 1/2 tablet mid-afternoon and one at bedtime). °Vitamin B6 100mg tablets. Take one tablet twice a day (up to 200 mg per day). ° °Skin Rashes: °Aveeno products °Benadryl cream or 25mg every 6 hours as needed °Calamine Lotion °1% cortisone cream ° °Yeast infection: °Gyne-lotrimin 7 °Monistat 7 ° ° °**If taking multiple medications, please check labels to avoid duplicating the same active ingredients °**take medication as directed on  the label °** Do not exceed 4000 mg of tylenol in 24 hours °**Do not take medications that contain aspirin or ibuprofen ° ° ° ° °First Trimester of Pregnancy °The first trimester of pregnancy is from week 1 until the end of week 13 (months 1 through 3). A week after a sperm fertilizes an egg, the egg will implant on the wall of the uterus. This embryo will begin to develop into a baby. Genes from you and your partner will form the baby. The female genes will determine whether the baby will be a boy or a girl. At 6-8 weeks, the eyes and face will be formed, and the heartbeat can be seen on ultrasound. At the end of 12 weeks, all the baby's organs will be formed. °Now that you are pregnant, you will want to do everything you can to have a healthy baby. Two of the most important things are to get good prenatal care and to follow your health care provider's instructions. Prenatal care is all the medical care you receive before the baby's birth. This care will help prevent, find, and treat any problems during the pregnancy and childbirth. °Body changes during your first trimester °Your body goes through many changes during pregnancy. The changes vary from woman to woman. °· You may gain or lose a couple of pounds at first. °· You may feel sick to your stomach (nauseous) and you may throw up (vomit). If the vomiting is uncontrollable, call your health care provider. °· You may tire easily. °· You may develop headaches that can be relieved by medicines. All medicines should be approved by your health care provider. °· You may urinate more often. Painful urination may mean you have   a bladder infection. °· You may develop heartburn as a result of your pregnancy. °· You may develop constipation because certain hormones are causing the muscles that push stool through your intestines to slow down. °· You may develop hemorrhoids or swollen veins (varicose veins). °· Your breasts may begin to grow larger and become tender. Your  nipples may stick out more, and the tissue that surrounds them (areola) may become darker. °· Your gums may bleed and may be sensitive to brushing and flossing. °· Dark spots or blotches (chloasma, mask of pregnancy) may develop on your face. This will likely fade after the baby is born. °· Your menstrual periods will stop. °· You may have a loss of appetite. °· You may develop cravings for certain kinds of food. °· You may have changes in your emotions from day to day, such as being excited to be pregnant or being concerned that something may go wrong with the pregnancy and baby. °· You may have more vivid and strange dreams. °· You may have changes in your hair. These can include thickening of your hair, rapid growth, and changes in texture. Some women also have hair loss during or after pregnancy, or hair that feels dry or thin. Your hair will most likely return to normal after your baby is born. ° °What to expect at prenatal visits °During a routine prenatal visit: °· You will be weighed to make sure you and the baby are growing normally. °· Your blood pressure will be taken. °· Your abdomen will be measured to track your baby's growth. °· The fetal heartbeat will be listened to between weeks 10 and 14 of your pregnancy. °· Test results from any previous visits will be discussed. ° °Your health care provider may ask you: °· How you are feeling. °· If you are feeling the baby move. °· If you have had any abnormal symptoms, such as leaking fluid, bleeding, severe headaches, or abdominal cramping. °· If you are using any tobacco products, including cigarettes, chewing tobacco, and electronic cigarettes. °· If you have any questions. ° °Other tests that may be performed during your first trimester include: °· Blood tests to find your blood type and to check for the presence of any previous infections. The tests will also be used to check for low iron levels (anemia) and protein on red blood cells (Rh antibodies).  Depending on your risk factors, or if you previously had diabetes during pregnancy, you may have tests to check for high blood sugar that affects pregnant women (gestational diabetes). °· Urine tests to check for infections, diabetes, or protein in the urine. °· An ultrasound to confirm the proper growth and development of the baby. °· Fetal screens for spinal cord problems (spina bifida) and Down syndrome. °· HIV (human immunodeficiency virus) testing. Routine prenatal testing includes screening for HIV, unless you choose not to have this test. °· You may need other tests to make sure you and the baby are doing well. ° °Follow these instructions at home: °Medicines °· Follow your health care provider's instructions regarding medicine use. Specific medicines may be either safe or unsafe to take during pregnancy. °· Take a prenatal vitamin that contains at least 600 micrograms (mcg) of folic acid. °· If you develop constipation, try taking a stool softener if your health care provider approves. °Eating and drinking °· Eat a balanced diet that includes fresh fruits and vegetables, whole grains, good sources of protein such as meat, eggs, or tofu, and low-fat   dairy. Your health care provider will help you determine the amount of weight gain that is right for you. °· Avoid raw meat and uncooked cheese. These carry germs that can cause birth defects in the baby. °· Eating four or five small meals rather than three large meals a day may help relieve nausea and vomiting. If you start to feel nauseous, eating a few soda crackers can be helpful. Drinking liquids between meals, instead of during meals, also seems to help ease nausea and vomiting. °· Limit foods that are high in fat and processed sugars, such as fried and sweet foods. °· To prevent constipation: °? Eat foods that are high in fiber, such as fresh fruits and vegetables, whole grains, and beans. °? Drink enough fluid to keep your urine clear or pale  yellow. °Activity °· Exercise only as directed by your health care provider. Most women can continue their usual exercise routine during pregnancy. Try to exercise for 30 minutes at least 5 days a week. Exercising will help you: °? Control your weight. °? Stay in shape. °? Be prepared for labor and delivery. °· Experiencing pain or cramping in the lower abdomen or lower back is a good sign that you should stop exercising. Check with your health care provider before continuing with normal exercises. °· Try to avoid standing for long periods of time. Move your legs often if you must stand in one place for a long time. °· Avoid heavy lifting. °· Wear low-heeled shoes and practice good posture. °· You may continue to have sex unless your health care provider tells you not to. °Relieving pain and discomfort °· Wear a good support bra to relieve breast tenderness. °· Take warm sitz baths to soothe any pain or discomfort caused by hemorrhoids. Use hemorrhoid cream if your health care provider approves. °· Rest with your legs elevated if you have leg cramps or low back pain. °· If you develop varicose veins in your legs, wear support hose. Elevate your feet for 15 minutes, 3-4 times a day. Limit salt in your diet. °Prenatal care °· Schedule your prenatal visits by the twelfth week of pregnancy. They are usually scheduled monthly at first, then more often in the last 2 months before delivery. °· Write down your questions. Take them to your prenatal visits. °· Keep all your prenatal visits as told by your health care provider. This is important. °Safety °· Wear your seat belt at all times when driving. °· Make a list of emergency phone numbers, including numbers for family, friends, the hospital, and police and fire departments. °General instructions °· Ask your health care provider for a referral to a local prenatal education class. Begin classes no later than the beginning of month 6 of your pregnancy. °· Ask for help if  you have counseling or nutritional needs during pregnancy. Your health care provider can offer advice or refer you to specialists for help with various needs. °· Do not use hot tubs, steam rooms, or saunas. °· Do not douche or use tampons or scented sanitary pads. °· Do not cross your legs for long periods of time. °· Avoid cat litter boxes and soil used by cats. These carry germs that can cause birth defects in the baby and possibly loss of the fetus by miscarriage or stillbirth. °· Avoid all smoking, herbs, alcohol, and medicines not prescribed by your health care provider. Chemicals in these products affect the formation and growth of the baby. °· Do not use any products   that contain nicotine or tobacco, such as cigarettes and e-cigarettes. If you need help quitting, ask your health care provider. You may receive counseling support and other resources to help you quit. °· Schedule a dentist appointment. At home, brush your teeth with a soft toothbrush and be gentle when you floss. °Contact a health care provider if: °· You have dizziness. °· You have mild pelvic cramps, pelvic pressure, or nagging pain in the abdominal area. °· You have persistent nausea, vomiting, or diarrhea. °· You have a bad smelling vaginal discharge. °· You have pain when you urinate. °· You notice increased swelling in your face, hands, legs, or ankles. °· You are exposed to fifth disease or chickenpox. °· You are exposed to German measles (rubella) and have never had it. °Get help right away if: °· You have a fever. °· You are leaking fluid from your vagina. °· You have spotting or bleeding from your vagina. °· You have severe abdominal cramping or pain. °· You have rapid weight gain or loss. °· You vomit blood or material that looks like coffee grounds. °· You develop a severe headache. °· You have shortness of breath. °· You have any kind of trauma, such as from a fall or a car accident. °Summary °· The first trimester of pregnancy is  from week 1 until the end of week 13 (months 1 through 3). °· Your body goes through many changes during pregnancy. The changes vary from woman to woman. °· You will have routine prenatal visits. During those visits, your health care provider will examine you, discuss any test results you may have, and talk with you about how you are feeling. °This information is not intended to replace advice given to you by your health care provider. Make sure you discuss any questions you have with your health care provider. °Document Released: 02/19/2001 Document Revised: 02/07/2016 Document Reviewed: 02/07/2016 °Elsevier Interactive Patient Education © 2017 [employer] ° °

## 2017-01-14 LAB — OBSTETRIC PANEL, INCLUDING HIV
ANTIBODY SCREEN: NEGATIVE
BASOS: 0 %
Basophils Absolute: 0 10*3/uL (ref 0.0–0.2)
EOS (ABSOLUTE): 0 10*3/uL (ref 0.0–0.4)
EOS: 0 %
HEMATOCRIT: 39.6 % (ref 34.0–46.6)
HEMOGLOBIN: 13.4 g/dL (ref 11.1–15.9)
HIV Screen 4th Generation wRfx: NONREACTIVE
Hepatitis B Surface Ag: NEGATIVE
Immature Grans (Abs): 0 10*3/uL (ref 0.0–0.1)
Immature Granulocytes: 0 %
LYMPHS ABS: 1.4 10*3/uL (ref 0.7–3.1)
Lymphs: 17 %
MCH: 29.6 pg (ref 26.6–33.0)
MCHC: 33.8 g/dL (ref 31.5–35.7)
MCV: 88 fL (ref 79–97)
MONOS ABS: 0.5 10*3/uL (ref 0.1–0.9)
Monocytes: 6 %
NEUTROS ABS: 6.4 10*3/uL (ref 1.4–7.0)
Neutrophils: 77 %
Platelets: 159 10*3/uL (ref 150–379)
RBC: 4.52 x10E6/uL (ref 3.77–5.28)
RDW: 13.9 % (ref 12.3–15.4)
RH TYPE: POSITIVE
RPR: NONREACTIVE
Rubella Antibodies, IGG: 5.2 index (ref >0.99)
WBC: 8.3 10*3/uL (ref 3.4–10.8)

## 2017-01-14 LAB — HEMOGLOBINOPATHY EVALUATION
Ferritin: 62 ng/mL (ref 15–150)
HGB A2 QUANT: 2.4 % (ref 1.8–3.2)
HGB C: 0 %
HGB S: 0 %
Hgb A: 97 % (ref 96.4–98.8)
Hgb F Quant: 0.6 % (ref 0.0–2.0)
Hgb Solubility: NEGATIVE
Hgb Variant: 0 %

## 2017-01-15 LAB — CYTOLOGY - PAP
Chlamydia: NEGATIVE
Neisseria Gonorrhea: NEGATIVE

## 2017-01-15 NOTE — Progress Notes (Signed)
.   Specimen dropped off.

## 2017-01-16 ENCOUNTER — Telehealth (HOSPITAL_BASED_OUTPATIENT_CLINIC_OR_DEPARTMENT_OTHER): Payer: Self-pay | Admitting: Registered Nurse

## 2017-01-16 LAB — CULTURE, OB URINE

## 2017-01-16 LAB — URINE CULTURE, OB REFLEX

## 2017-01-16 MED ORDER — RANITIDINE HCL 300 MG PO TABS
300.0000 mg | ORAL_TABLET | Freq: Every evening | ORAL | 5 refills | Status: DC
Start: 2017-01-16 — End: 2017-01-28

## 2017-01-16 MED ORDER — RANITIDINE HCL 300 MG PO TABS: 300 mg | tablet | Freq: Every evening | ORAL | 5 refills | 0 days | Status: DC

## 2017-01-16 NOTE — Telephone Encounter (Signed)
-----   Message from Anda Latina sent at 01/16/2017 10:17 AM EST -----  Regarding: med change request / please resend to correct pharmacy   Contact: [place],  [id number],  21 year old,  female  Telephone Information:  Home Phone      [phone]  Work Phone      Not on file.  Mobile          [phone]    Patient's PCP: [name] MD    Patient's language of care:   English  Caller does not need an interpreter.    Person calling on behalf of patient: Pharmacy / [facility]    Calls today  Medication Questions  Pharmacy Calling    Reason for call: would like to change rx from capsule to tabs ranitidine (ZANTAC) 300 MG capsule  Name of [facility]ist calling [place] [facility]   Return phone number [phone]  Is the patient waiting at the pharmacy? Unsure    Mother called and would [facility] [place]  Please call mother when done  [phone]    [facility] [place] [place]  Phone: [phone] Fax: [place] IDUPBD:     [phone]

## 2017-01-16 NOTE — Progress Notes (Signed)
Patient requesting Zantac 300 mg Tablets instead of Capsules. [facility].

## 2017-01-20 DIAGNOSIS — R87612 Low grade squamous intraepithelial lesion on cytologic smear of cervix (LGSIL): Secondary | ICD-10-CM | POA: Insufficient documentation

## 2017-01-21 ENCOUNTER — Telehealth: Payer: Self-pay | Admitting: General Practice

## 2017-01-21 NOTE — Telephone Encounter (Signed)
-----   Message from Armando ReichertHeather D Hogan, CNM sent at 01/18/2017 11:07 AM EST ----- Patient needs colpo.

## 2017-01-21 NOTE — Telephone Encounter (Signed)
Called patient and explained/informed her of pap results & colposcopy. Told patient we can schedule this in December after her prenatal appt. Patient verbalized understanding & had no questions

## 2017-01-22 LAB — CYSTIC FIBROSIS MUTATION 97: GENE DIS ANAL CARRIER INTERP BLD/T-IMP: NOT DETECTED

## 2017-01-23 ENCOUNTER — Ambulatory Visit (HOSPITAL_COMMUNITY)
Admission: RE | Admit: 2017-01-23 | Discharge: 2017-01-23 | Disposition: A | Discharge status: Home / Self Care | Payer: Medicaid Other | Source: Ambulatory Visit | Attending: Advanced Practice Midwife | Admitting: Advanced Practice Midwife

## 2017-01-23 ENCOUNTER — Encounter (HOSPITAL_COMMUNITY): Payer: Self-pay

## 2017-01-23 DIAGNOSIS — Z3401 Encounter for supervision of normal first pregnancy, first trimester: Secondary | ICD-10-CM | POA: Insufficient documentation

## 2017-01-23 DIAGNOSIS — Z3682 Encounter for antenatal screening for nuchal translucency: Secondary | ICD-10-CM | POA: Insufficient documentation

## 2017-01-23 DIAGNOSIS — Z3400 Encounter for supervision of normal first pregnancy, unspecified trimester: Secondary | ICD-10-CM

## 2017-01-23 HISTORY — DX: Other specified health status: Z78.9

## 2017-01-23 NOTE — Addendum Note (Signed)
Encounter addended by: Emeline DarlingKiser, Khadeejah Castner E on: 01/23/2017 11:36 AM  Actions taken: Imaging Exam ended

## 2017-01-23 NOTE — Addendum Note (Signed)
Encounter addended by: Lenise ArenaBazemore, Jamelle Noy, RDMS on: 01/23/2017 11:36 AM  Actions taken: Imaging Exam ended

## 2017-01-28 ENCOUNTER — Telehealth (HOSPITAL_BASED_OUTPATIENT_CLINIC_OR_DEPARTMENT_OTHER): Payer: Self-pay | Admitting: Registered Nurse

## 2017-01-28 MED ORDER — RANITIDINE HCL 300 MG PO TABS
300.00 mg | ORAL_TABLET | Freq: Every evening | ORAL | 5 refills | Status: AC
Start: 2017-01-28 — End: 2017-07-27

## 2017-01-28 MED ORDER — RANITIDINE HCL 300 MG PO TABS: 300 mg | tablet | Freq: Every evening | ORAL | 5 refills | 0 days | Status: AC

## 2017-01-28 NOTE — Telephone Encounter (Signed)
-----   Message from Weston Brass sent at 01/28/2017 11:06 AM EST -----  Regarding: Pharmacy Calling to change meds  Contact: [phone]  Dawn [place],  [phone],  21 year old,  female  Telephone Information:  Home Phone      [phone]  Work Phone      Not on file.  Mobile          [phone]    Patient's PCP: [name] MD    Patient's language of care:   English  Caller does not need an interpreter.    Person calling on behalf of patient: Pharmacy [facility]    Calls today  Medication Questions  Pharmacy Calling    Reason for call: requesting to change meds to tablets  Name of Pharmacist calling [place]  Return phone number [phone]  Is the patient waiting at the pharmacy? No    CALL BACK NUMBER: [phone]

## 2017-02-10 ENCOUNTER — Other Ambulatory Visit (HOSPITAL_COMMUNITY): Payer: Self-pay

## 2017-02-10 ENCOUNTER — Encounter: Payer: Self-pay | Admitting: Obstetrics and Gynecology

## 2017-02-10 ENCOUNTER — Ambulatory Visit (INDEPENDENT_AMBULATORY_CARE_PROVIDER_SITE_OTHER): Payer: Medicaid Other | Admitting: Obstetrics and Gynecology

## 2017-02-10 ENCOUNTER — Ambulatory Visit (INDEPENDENT_AMBULATORY_CARE_PROVIDER_SITE_OTHER): Payer: Medicaid Other | Admitting: Clinical

## 2017-02-10 VITALS — BP 104/64 | HR 77 | Wt 143.1 lb

## 2017-02-10 DIAGNOSIS — R87612 Low grade squamous intraepithelial lesion on cytologic smear of cervix (LGSIL): Secondary | ICD-10-CM

## 2017-02-10 DIAGNOSIS — O99342 Other mental disorders complicating pregnancy, second trimester: Secondary | ICD-10-CM

## 2017-02-10 DIAGNOSIS — Z3402 Encounter for supervision of normal first pregnancy, second trimester: Secondary | ICD-10-CM

## 2017-02-10 DIAGNOSIS — F32A Depression, unspecified: Secondary | ICD-10-CM

## 2017-02-10 DIAGNOSIS — O99340 Other mental disorders complicating pregnancy, unspecified trimester: Secondary | ICD-10-CM

## 2017-02-10 DIAGNOSIS — F4321 Adjustment disorder with depressed mood: Secondary | ICD-10-CM

## 2017-02-10 DIAGNOSIS — Z3400 Encounter for supervision of normal first pregnancy, unspecified trimester: Secondary | ICD-10-CM

## 2017-02-10 DIAGNOSIS — F329 Major depressive disorder, single episode, unspecified: Secondary | ICD-10-CM | POA: Diagnosis not present

## 2017-02-10 NOTE — Progress Notes (Signed)
States has felt baby move once. Patient and/or legal guardian verbally consented to meet with Behavioral Health Clinician about presenting concerns.

## 2017-02-10 NOTE — Progress Notes (Signed)
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  The patient was placed in the dorsal lithotomy position. A graves was speculum inserted in the vagina, and the cervix was visualized.  AA staining done Lugol's with green filter.  The SCJ was noted in its entirety and some small acetowhite changes noted at 7 o'clock and punctations at 2 o'clock. No biopsies taken. No bleeding noted.  Findings:  small acetowhite changes noted at 7 o'clock and punctations at 2 o'clock  Adequate: yes  Specimens: none  Impressions: LGSIL, needs colpo with biopsies post partum  Condition: Stable  Complications: None  Plan: The patient was advised to call for any fever or for prolonged or severe pain or bleeding. She was advised to use OTC analgesics as needed for mild to moderate pain. She was advised to avoid vaginal intercourse for 48 hours or until the bleeding has completely stopped.   Baldemar LenisK. Meryl Rosaly Labarbera, M.D. Attending Obstetrician & Gynecologist, [facility], [facility]

## 2017-02-10 NOTE — BH Specialist Note (Signed)
Integrated Behavioral Health Initial Visit  MRN: [id number] Name: Emily Levine  Number of Integrated Behavioral Health Clinician visits:: 1/6 Session Start time: 3:30  Session End time: 3:54 Total time: 30 minutes  Type of Service: Integrated Behavioral Health- Individual/Family Interpretor:No. Interpretor Name and Language: n/a   Warm Hand Off Completed.       SUBJECTIVE: Emily Lomanda Salsbury is a 21 y.o. female accompanied by Partner/Significant Other Patient was referred by Dr Earlene Plateravis for depression Patient reports the following symptoms/concerns: Pt states that her primary concern today is feeling depressed during this pregnancy, has been treated for depression in the past, but prefers to cope without medication at this time. Pt states that she  Duration of problem: Current pregnancy; Severity of problem: moderate  OBJECTIVE: Mood: Depressed and Affect: Depressed Risk of harm to self or others: No plan to harm self or others denies SI   LIFE CONTEXT: Family and Social: Lives with FOB; family and friends in [place] School/Work: Begins new job this week; FOB works Economistfulltime Self-Care: spends time with her cats Life Changes: Current first pregnancy  GOALS ADDRESSED: Patient will: 1. Reduce symptoms of: depression and insomnia 2. Increase knowledge and/or ability of: coping skills  3. Demonstrate ability to: Increase healthy adjustment to current life circumstances  INTERVENTIONS: Interventions utilized: Mining engineerBehavioral Activation, Sleep Hygiene and Psychoeducation and/or Health Education  Standardized Assessments completed: GAD-7 and PHQ 9  ASSESSMENT: Patient currently experiencing Adjustment disorder with depressed mood.   Patient may benefit from psychoeducation and brief therapeutic intervention regarding coping with symptoms of depression  PLAN: 1. Follow up with behavioral health clinician on : three weeks 2. Behavioral recommendations:  -Begin new job tomorrow -Enjoy  trip to [place] to spend time with family -Consider apps for improved sleep and coping until next visit -Read educational material regarding coping with symptoms of depression 3. Referral(s): [facility] ([facility]) 4. "From scale of 1-10, how likely are you to follow plan?": 8  Rae LipsJamie C Shakiara Lukic, LCSW   Depression screen [facility] 2/9 02/10/2017  Decreased Interest 3  Down, Depressed, Hopeless 2  PHQ - 2 Score 5  Altered sleeping 3  Tired, decreased energy 3  Change in appetite 3  Feeling bad or failure about yourself  2  Trouble concentrating 1  Moving slowly or fidgety/restless 2  Suicidal thoughts 1  PHQ-9 Score 20   GAD 7 : Generalized Anxiety Score 02/10/2017  Nervous, Anxious, on Edge 2  Control/stop worrying 2  Worry too much - different things 2  Trouble relaxing 1  Restless 0  Easily annoyed or irritable 1  Afraid - awful might happen 1  Total GAD 7 Score 9

## 2017-02-10 NOTE — Progress Notes (Signed)
   PRENATAL VISIT NOTE  Subjective:  Emily Levine is a 21 y.o. G1P0 at 378w0d being seen today for ongoing prenatal care.  She is currently monitored for the following issues for this low-risk pregnancy and has Supervision of normal first pregnancy, antepartum and LGSIL on Pap smear of cervix on their problem list.  Patient reports poor appetite and some nausea, now improved.  Contractions: Not present. Vag. Bleeding: None.  Movement: Present. Denies leaking of fluid.   The following portions of the patient's history were reviewed and updated as appropriate: allergies, current medications, past family history, past medical history, past social history, past surgical history and problem list. Problem list updated.  Objective:   Vitals:   02/10/17 1439  BP: 104/64  Pulse: 77  Weight: 143 lb 1.6 oz (64.9 kg)    Fetal Status: Fetal Heart Rate (bpm): 149   Movement: Present     General:  Alert, oriented and cooperative. Patient is in no acute distress.  Skin: Skin is warm and dry. No rash noted.   Cardiovascular: Normal heart rate noted  Respiratory: Normal respiratory effort, no problems with respiration noted  Abdomen: Soft, gravid, appropriate for gestational age.  Pain/Pressure: Present     Pelvic: Cervical exam deferred        Extremities: Normal range of motion.  Edema: None  Mental Status:  Normal mood and affect. Normal behavior. Normal judgment and thought content.   Assessment and Plan:  Pregnancy: G1P0 at 838w0d  1. Supervision of normal first pregnancy, antepartum No issues - Ambulatory referral to Integrated Behavioral Health - US MFM OB COMP + 14 WK; Future  2. Depression affecting pregnancy - Ambulatory referral to Integrated Behavioral Health  3. LGSIL on Pap smear of cervix Please see colpo note   Preterm labor symptoms and general obstetric precautions including but not limited to vaginal bleeding, contractions, leaking of fluid and fetal movement were  reviewed in detail with the patient. Please refer to After Visit Summary for other counseling recommendations.  Return in about 4 weeks (around 03/10/2017) for OB visit.   Conan BowensKelly M Taray Normoyle, MD

## 2017-03-06 ENCOUNTER — Other Ambulatory Visit: Payer: Self-pay | Admitting: Obstetrics and Gynecology

## 2017-03-06 ENCOUNTER — Ambulatory Visit (HOSPITAL_COMMUNITY)
Admission: RE | Admit: 2017-03-06 | Discharge: 2017-03-06 | Disposition: A | Discharge status: Home / Self Care | Payer: Medicaid Other | Source: Ambulatory Visit | Attending: Obstetrics and Gynecology | Admitting: Obstetrics and Gynecology

## 2017-03-06 ENCOUNTER — Ambulatory Visit (HOSPITAL_COMMUNITY): Payer: Medicaid Other

## 2017-03-06 DIAGNOSIS — Z363 Encounter for antenatal screening for malformations: Secondary | ICD-10-CM | POA: Insufficient documentation

## 2017-03-06 DIAGNOSIS — Z3A19 19 weeks gestation of pregnancy: Secondary | ICD-10-CM

## 2017-03-06 DIAGNOSIS — Z369 Encounter for antenatal screening, unspecified: Secondary | ICD-10-CM

## 2017-03-06 DIAGNOSIS — Z3400 Encounter for supervision of normal first pregnancy, unspecified trimester: Secondary | ICD-10-CM

## 2017-03-10 ENCOUNTER — Encounter: Payer: Medicaid Other | Admitting: Advanced Practice Midwife

## 2017-03-11 NOTE — L&D Delivery Note (Signed)
Operative Delivery Note At  a viable female was delivered via .  Presentation: vertex; Position: Left,, Occiput,, Anterior; Station: +3.  Verbal consent: obtained from patient.  Risks and benefits discussed in detail.  Risks include, but are not limited to the risks of anesthesia, bleeding, infection, damage to maternal tissues, fetal cephalhematoma.  There is also the risk of inability to effect vaginal delivery of the head, or shoulder dystocia that cannot be resolved by established maneuvers, leading to the need for emergency cesarean section.  APGAR: 5,7 and 9; weight  .   Placenta status:spont ,via shultz .   Cord: 3vc with the following complications:none .  Cord pH: pending  Anesthesia:  epidural Instruments: kiwi vac Episiotomy:  none Lacerations:  R side wall Suture Repair: 2.0 vicryl rapide Est. Blood Loss 200(mL):    Mom to postpartum.  Baby to Couplet care / Skin to Skin.  Emily Levine 08/03/2017, 6:07 PM

## 2017-03-21 ENCOUNTER — Ambulatory Visit (INDEPENDENT_AMBULATORY_CARE_PROVIDER_SITE_OTHER): Payer: Medicaid Other | Admitting: Clinical

## 2017-03-21 ENCOUNTER — Encounter: Payer: Self-pay | Admitting: Obstetrics and Gynecology

## 2017-03-21 ENCOUNTER — Ambulatory Visit (INDEPENDENT_AMBULATORY_CARE_PROVIDER_SITE_OTHER): Payer: Medicaid Other | Admitting: Obstetrics and Gynecology

## 2017-03-21 VITALS — BP 93/51 | HR 70 | Wt 152.2 lb

## 2017-03-21 DIAGNOSIS — F329 Major depressive disorder, single episode, unspecified: Secondary | ICD-10-CM

## 2017-03-21 DIAGNOSIS — F4321 Adjustment disorder with depressed mood: Secondary | ICD-10-CM

## 2017-03-21 DIAGNOSIS — Z3400 Encounter for supervision of normal first pregnancy, unspecified trimester: Secondary | ICD-10-CM

## 2017-03-21 DIAGNOSIS — F32A Depression, unspecified: Secondary | ICD-10-CM

## 2017-03-21 DIAGNOSIS — O99342 Other mental disorders complicating pregnancy, second trimester: Secondary | ICD-10-CM

## 2017-03-21 NOTE — Patient Instructions (Addendum)
Safe Medications in Pregnancy   Acne: Benzoyl Peroxide Salicylic Acid  Backache/Headache: Tylenol: 2 regular strength every 4 hours OR              2 Extra strength every 6 hours  Colds/Coughs/Allergies: Benadryl (alcohol free) 25 mg every 6 hours as needed Breath right strips Claritin Cepacol throat lozenges Chloraseptic throat spray Cold-Eeze- up to three times per day Cough drops, alcohol free Flonase (by prescription only) Guaifenesin Mucinex Robitussin DM (plain only, alcohol free) Saline nasal spray/drops Sudafed (pseudoephedrine) & Actifed ** use only after [redacted] weeks gestation and if you do not have high blood pressure Tylenol Vicks Vaporub Zinc lozenges Zyrtec   Constipation: Colace Ducolax suppositories Fleet enema Glycerin suppositories Metamucil Milk of magnesia Miralax Senokot Smooth move tea  Diarrhea: Kaopectate Imodium A-D  *NO pepto Bismol  Hemorrhoids: Anusol Anusol HC Preparation H Tucks  Indigestion: Tums Maalox Mylanta Zantac  Pepcid  Insomnia: Benadryl (alcohol free) 60m every 6 hours as needed Tylenol PM Unisom, no Gelcaps  Leg Cramps: Tums MagGel  Nausea/Vomiting:  Bonine Dramamine Emetrol Ginger extract Sea bands Meclizine  Nausea medication to take during pregnancy:  Unisom (doxylamine succinate 25 mg tablets) Take one tablet daily at bedtime. If symptoms are not adequately controlled, the dose can be increased to a maximum recommended dose of two tablets daily (1/2 tablet in the morning, 1/2 tablet mid-afternoon and one at bedtime). Vitamin B6 1032mtablets. Take one tablet twice a day (up to 200 mg per day).  Skin Rashes: Aveeno products Benadryl cream or 2581mvery 6 hours as needed Calamine Lotion 1% cortisone cream  Yeast infection: Gyne-lotrimin 7 Monistat 7   **If taking multiple medications, please check labels to avoid duplicating the same active ingredients **take medication as directed on  the label ** Do not exceed 4000 mg of tylenol in 24 hours **Do not take medications that contain aspirin or ibuprofen  AREA PEDConcow[address]uite 400New PlymouthC  [id number] - [phone] - [phone] [facility] OF GREMontpeliera87[address] CourtiLee MontePawleys IslandC [id number] - [phone] - [phone] ParPickeringC  [id number] - [phone] - [phone] Diamondlm[address]uiGarden ViewGreKickapoo Site 7C  [id number] - [phone] - [phone]eQuinwoodC  [id number] - [phone] - [phone] [address] Avenueuite 203299gTappanC  [id number] - [phone] - [phone]uiFeather SoundeCarencroC  [id number] - [phone] - [phone] StreetyFayettevilleuiBermuda Run0 GreNew AthensC  [id number] - [phone] - [phone]eLibertyvilleC  [id number] - [phone] - [phone] Eye Group AscMBelgrade2Watchtowerlm[address]eMagazineC  [address]902one - [phone] - [phone] FAMMonarch Mill16C. HigColfaxC  [id number] - [phone] - [phone] Nw PhoenixMILY MEDIC[facility] [id number] DriveuiFlorenceC  27463149one - [phone] - [phone]  [facility] [address] [place], [place]  [address] Phone - [phone]   Fax - [phone]  [facility] [address] [place], [place]  [address] Phone - [phone]  [facility] [address] [place], [place]  [address] Phone - [phone]   Fax - [phone]  [facility] [address] [place], [place]  [address] Phone - [phone]   Fax - [phone]  [facility] [address] [place], [place]  [address] Phone - [phone]   Fax - [phone]  [facility] [address] [place], [place]  [address] Phone - [phone]   Fax - [phone]   [facility] [address] [place], [place]  [address] Phone - [phone]   Fax - [phone]  [facility] [address] [place], [place]  [address] Phone - [phone]   Fax - [phone]  DAVID RUBIN [address] [place], [place]  [address] Phone - [phone]   Fax - [phone]  [facility] [address] [place], [place]  [address] Phone - [phone]   Fax - [phone]  [place] - Alita Chyle [address] [place], [place]  [address] Phone - [phone]   Fax - [phone] Gerarda Fraction [address] [place], [place]  [address] Phone - [phone]   Fax - [phone]  [facility] [address] [place], [place]  [address] Phone - [phone]   Fax - [phone]  [facility] [place] [address] [place], [place]  [address] Phone - [phone]   Fax - [phone]  [facility] [place] Wyvonne Lenz MD [address] [place] [address] Phone [phone]  Fax [phone]   Places to have your son circumcised:    [facility] [phone] while you are in hospital  [facility] [phone] $244 by 4 wks  [facility] [phone] $175 by 2 wks  [facility] [phone] $250 by 7  days [facility] [phone] $[phone] by 4 wks  These prices sometimes change but are roughly what you can expect to pay. Please call and confirm pricing.   Circumcision is considered an elective/non-medically necessary procedure. There are many reasons parents decide to have their sons circumsized. During the first year of life circumcised males have a reduced risk of urinary tract infections but after this year the rates between circumcised males and uncircumcised males are the same.  It is safe to have your son circumcised outside of the hospital and the places above perform them regularly.   Deciding about Circumcision in Baby Boys  (Up-to-date The Basics)  What is circumcision?  Circumcision is a surgery that removes the skin that covers the tip of the penis, called the "foreskin" Circumcision is usually done when a boy is between 49 and 28 days old. In the [place], circumcision is common. In some other countries, fewer boys are circumcised. Circumcision is a common tradition in some religions.  Should I have my baby boy circumcised?  There is no easy answer. Circumcision has some benefits. But it also has risks. After talking with your doctor, you will have to decide for yourself what is right for your family.  What are the benefits of circumcision?  Circumcised boys seem to have slightly lower rates of: ?Urinary tract infections ?Swelling of the opening at the tip of the penis Circumcised men seem to have  slightly lower rates of: ?Urinary tract infections ?Swelling of the opening at the tip of the penis ?Penis cancer ?HIV and other infections that you catch during sex ?Cervical cancer in the women they have sex with Even so, in the [place], the risks of these problems are small - even in boys and men who have not been circumcised. Plus, boys and men who are not circumcised can reduce these extra risks by: ?Cleaning their penis well ?Using condoms during  sex  What are the risks of circumcision?  Risks include: ?Bleeding or infection from the surgery ?Damage to or amputation of the penis ?A chance that the doctor will cut off too much or not enough of the foreskin ?A chance that sex won't feel as good later in life Only about 1 out of every 200 circumcisions leads to problems. There is also a chance that your health insurance won't pay for circumcision.  How is circumcision done in baby boys?  First, the baby gets medicine for pain relief. This might be a cream on the skin or a shot into the base of the penis. Next, the doctor cleans the baby's penis well. Then he or she uses special tools to cut off the foreskin. Finally, the doctor wraps a bandage (called gauze) around the baby's penis. If you have your baby circumcised, his doctor or nurse will give you instructions on how to care for him after the surgery. It is important that you follow those instructions carefully.

## 2017-03-21 NOTE — BH Specialist Note (Signed)
Integrated Behavioral Health Follow Up Visit  MRN: [id number] Name: Emily Levine  Number of Integrated Behavioral Health Clinician visits: 2/6 Session Start time: 3:10  Session End time: 3:30 Total time: 20 minutes  Type of Service: Integrated Behavioral Health- Individual/Family Interpretor:No. Interpretor Name and Language: n/a  SUBJECTIVE: Emily Levine is a 22 y.o. female accompanied by n/a Patient was referred by Dr Earlene Plateravis for depression. Patient reports the following symptoms/concerns: Pt states her primary concern today is sometimes waking up with nightmares and heartburn; overall, she has been feeling better since seeing her family in [place], starting a new job, finding out she is having a boy, and listening to music. Duration of problem:Current pregnancy Severity of problem: moderate  OBJECTIVE: Mood: Appropriate and Affect: Appropriate Risk of harm to self or others: No plan to harm self or others  LIFE CONTEXT: Family and Social: Lives with FOB; family and friends in [place] School/Work: Enjoying her new job Self-Care: Spends time with cats; working daily helps her cope Life Changes: Current pregnancy  GOALS ADDRESSED: Patient will: 1.  Reduce symptoms of: depression  2.  Increase knowledge and/or ability of: healthy habits  3.  Demonstrate ability to: Increase healthy adjustment to current life circumstances  INTERVENTIONS: Interventions utilized:  Sleep Hygiene Standardized Assessments completed: GAD-7 and PHQ 9  ASSESSMENT: Patient currently experiencing Adjustment disorder with depressed mood.   Patient may benefit from continued brief therapeutic intervention regarding coping with symptoms of depression.  PLAN: 1. Follow up with behavioral health clinician on : One month, or earlier if requested 2. Behavioral recommendations:  -Consider spending 20 minutes outside every morning on non-work days -Consider increasing water intake earlier in the  day; decreasing close to bedtime, to help limit waking up at night -Continue talking routinely to friends and family; listening to music daily 3. Referral(s): [facility]) 4. "From scale of 1-10, how likely are you to follow plan?": 9  Rae LipsJamie C McMannes, LCSW   Depression screen [facility] 2/9 03/21/2017 02/10/2017  Decreased Interest 2 3  Down, Depressed, Hopeless 2 2  PHQ - 2 Score 4 5  Altered sleeping 2 3  Tired, decreased energy 2 3  Change in appetite 2 3  Feeling bad or failure about yourself  2 2  Trouble concentrating 3 1  Moving slowly or fidgety/restless 1 2  Suicidal thoughts 0 1  PHQ-9 Score 16 20   GAD 7 : Generalized Anxiety Score 03/21/2017 02/10/2017  Nervous, Anxious, on Edge 2 2  Control/stop worrying 2 2  Worry too much - different things 2 2  Trouble relaxing 0 1  Restless 0 0  Easily annoyed or irritable 0 1  Afraid - awful might happen 0 1  Total GAD 7 Score 6 9

## 2017-03-21 NOTE — Progress Notes (Signed)
   PRENATAL VISIT NOTE  Subjective:  Emily Levine is a 22 y.o. G1P0 at 5316w4d being seen today for ongoing prenatal care.  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  Patient reports a two day history of bloating, heartburn, and intermittent nausea. She has not taken anything for her symptoms. She denies vomiting, fever, chills, or diarrhea.  Contractions: Not present. Vag. Bleeding: None.  Movement: (!) Decreased. She states she began feeling fetal movements for the first time last week. Denies leaking of fluid.   The following portions of the patient's history were reviewed and updated as appropriate: allergies, current medications, past family history, past medical history, past social history, past surgical history and problem list. Problem list updated.  Objective:   Vitals:   03/21/17 1458  BP: (!) 93/51  Pulse: 70  Weight: 152 lb 3.2 oz (69 kg)    Fetal Status: Fetal Heart Rate (bpm): 140   Movement: (!) Decreased     General:  Alert, oriented and cooperative. Patient is in no acute distress.  Skin: Skin is warm and dry. No rash noted.   Cardiovascular: Normal heart rate noted  Respiratory: Normal respiratory effort, no problems with respiration noted  Abdomen: Soft, gravid, appropriate for gestational age.  Pain/Pressure: Present     Pelvic: Cervical exam deferred        Extremities: Normal range of motion.  Edema: None  Mental Status:  Normal mood and affect. Normal behavior. Normal judgment and thought content.   Assessment and Plan:  Pregnancy: G1P0 at 416w4d  1. Supervision of normal first pregnancy, antepartum Patient's symptoms consistent with heartburn in pregnancy. She was given a list of OTC medications to try to alleviate her symptoms. List of local pediatricians  given to baby F/u ultrasound scheduled for 04/03/17  2. 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 Please refer to After Visit Summary for other counseling recommendations.   Francie MassingMelissa  Jaquelynn Wanamaker, Medical Student

## 2017-03-21 NOTE — Progress Notes (Signed)
Pt states is having real bad gas & has been nauseated.

## 2017-03-26 ENCOUNTER — Ambulatory Visit (HOSPITAL_BASED_OUTPATIENT_CLINIC_OR_DEPARTMENT_OTHER): Payer: PRIVATE HEALTH INSURANCE | Admitting: Family Medicine

## 2017-03-26 VITALS — BP 94/62 | HR 87 | Temp 97.0°F | Wt 195.0 lb

## 2017-03-26 DIAGNOSIS — B373 Candidiasis of vulva and vagina: Secondary | ICD-10-CM | POA: Diagnosis not present

## 2017-03-26 DIAGNOSIS — B3731 Acute candidiasis of vulva and vagina: Secondary | ICD-10-CM

## 2017-03-26 DIAGNOSIS — Z113 Encounter for screening for infections with a predominantly sexual mode of transmission: Secondary | ICD-10-CM | POA: Diagnosis not present

## 2017-03-26 LAB — VAGINAL KOH (POINT OF CARE)

## 2017-03-26 MED ORDER — FLUCONAZOLE 150 MG PO TABS
150.00 mg | ORAL_TABLET | Freq: Once | ORAL | 0 refills | Status: AC
Start: 2017-03-26 — End: 2017-03-26

## 2017-03-26 MED ORDER — FLUCONAZOLE 150 MG PO TABS: 150 mg | tablet | Freq: Once | ORAL | 0 refills | 0 days | Status: AC

## 2017-03-26 NOTE — Patient Instructions (Signed)
Patient Education     Vaginal Yeast infection, Adult  Vaginal yeast infection is a condition that causes soreness, swelling, and redness (inflammation) of the vagina. It also causes vaginal discharge. This is a common condition. Some women get this infection frequently.  What are the causes?  This condition is caused by a change in the normal balance of the yeast (candida) and bacteria that live in the vagina. This change causes an overgrowth of yeast, which causes the inflammation.  What increases the risk?  This condition is more likely to develop in:  · Women who take antibiotic medicines.  · Women who have diabetes.  · Women who take birth control pills.  · Women who are pregnant.  · Women who douche often.  · Women who have a weak defense (immune) system.  · Women who have been taking steroid medicines for a long time.  · Women who frequently wear tight clothing.    What are the signs or symptoms?  Symptoms of this condition include:  · White, thick vaginal discharge.  · Swelling, itching, redness, and irritation of the vagina. The lips of the vagina (vulva) may be affected as well.  · Pain or a burning feeling while urinating.  · Pain during sex.    How is this diagnosed?  This condition is diagnosed with a medical history and physical exam. This will include a pelvic exam. Your health care provider will examine a sample of your vaginal discharge under a microscope. Your health care provider may send this sample for testing to confirm the diagnosis.  How is this treated?  This condition is treated with medicine. Medicines may be over-the-counter or prescription. You may be told to use one or more of the following:  · Medicine that is taken orally.  · Medicine that is applied as a cream.  · Medicine that is inserted directly into the vagina (suppository).    Follow these instructions at home:  · Take or apply over-the-counter and prescription medicines only as told by your health care provider.  · Do not have  sex until your health care provider has approved. Tell your sex partner that you have a yeast infection. That person should go to his or her health care provider if he or she develops symptoms.  · Do not wear tight clothes, such as pantyhose or tight pants.  · Avoid using tampons until your health care provider approves.  · Eat more yogurt. This may help to keep your yeast infection from returning.  · Try taking a sitz bath to help with discomfort. This is a warm water bath that is taken while you are sitting down. The water should only come up to your hips and should cover your buttocks. Do this 3-4 times per day or as told by your health care provider.  · Do not douche.  · Wear breathable, cotton underwear.  · If you have diabetes, keep your blood sugar levels under control.  Contact a health care provider if:  · You have a fever.  · Your symptoms go away and then return.  · Your symptoms do not get better with treatment.  · Your symptoms get worse.  · You have new symptoms.  · You develop blisters in or around your vagina.  · You have blood coming from your vagina and it is not your menstrual period.  · You develop pain in your abdomen.  This information is not intended to replace advice given to you by   your health care provider. Make sure you discuss any questions you have with your health care provider.  Document Released: 12/05/2004 Document Revised: 08/09/2015 Document Reviewed: 08/29/2014  Elsevier Interactive Patient Education © 2017 Elsevier Inc.

## 2017-03-26 NOTE — Progress Notes (Signed)
45F complaining of vaginal itching.  Started 3d ago and yesterday started to have vaginal discharge.  Described as white, cottage cheese that is not foul smelling.  No accompanying urinary symptoms of burning.  She is sexually active and monogamous (unsure if mutual) and consistent with condom use.      Patient Active Problem List:     BMI 35.0-35.9,adult     Family history of diabetes mellitus     Peripheral retinal degeneration, lattice     Low grade squamous intraepith lesion on cytologic smear cervix (lgsil)         Review of Systems   Constitutional: Negative for fever.   Gastrointestinal: Negative for abdominal pain.   Genitourinary: Negative for dysuria and urgency.       Physical Exam   Constitutional: She appears well-developed and well-nourished.   BP 94/62  Pulse 87  Temp 97 F (36.1 C) (Temporal)  Wt 88.5 kg (195 lb)  LMP 02/27/2017  SpO2 99%  BMI 33.47 kg/m2   Genitourinary:   Genitourinary Comments: Erythema in the perineum, white cottage cheese dc in the vaginal vault, ph 5.5       ASSESSMENT/PLAN  (B37.3) Yeast vaginitis  (primary encounter diagnosis)  Plan: VAGINAL KOH (POINT OF CARE)      (Z11.3) Routine screening for STI (sexually transmitted infection)  Plan: AMPLIFIED GENPROBE CHLAM/GC        Reviewed recommendation for annual flu vaccine and Tetanus booster every 10y.  Flu shot and Td, offered and declined.

## 2017-03-27 LAB — CHLAMYDIA GC NAAT
GENPROBE CHLAMYDIA: NEGATIVE
GENPROBE GC: NEGATIVE

## 2017-04-03 ENCOUNTER — Other Ambulatory Visit: Payer: Self-pay | Admitting: Obstetrics and Gynecology

## 2017-04-03 ENCOUNTER — Ambulatory Visit (HOSPITAL_COMMUNITY)
Admission: RE | Admit: 2017-04-03 | Discharge: 2017-04-03 | Disposition: A | Discharge status: Home / Self Care | Payer: Medicaid Other | Source: Ambulatory Visit | Attending: Obstetrics and Gynecology | Admitting: Obstetrics and Gynecology

## 2017-04-03 DIAGNOSIS — Z362 Encounter for other antenatal screening follow-up: Secondary | ICD-10-CM | POA: Insufficient documentation

## 2017-04-03 DIAGNOSIS — O321XX0 Maternal care for breech presentation, not applicable or unspecified: Secondary | ICD-10-CM | POA: Insufficient documentation

## 2017-04-03 DIAGNOSIS — Z3A23 23 weeks gestation of pregnancy: Secondary | ICD-10-CM

## 2017-04-03 DIAGNOSIS — Z3400 Encounter for supervision of normal first pregnancy, unspecified trimester: Secondary | ICD-10-CM

## 2017-04-14 ENCOUNTER — Encounter: Payer: Self-pay | Admitting: Advanced Practice Midwife

## 2017-04-14 ENCOUNTER — Ambulatory Visit (INDEPENDENT_AMBULATORY_CARE_PROVIDER_SITE_OTHER): Payer: Medicaid Other | Admitting: Advanced Practice Midwife

## 2017-04-14 VITALS — BP 101/58 | HR 69 | Wt 162.9 lb

## 2017-04-14 DIAGNOSIS — Z3402 Encounter for supervision of normal first pregnancy, second trimester: Secondary | ICD-10-CM

## 2017-04-14 DIAGNOSIS — Z23 Encounter for immunization: Secondary | ICD-10-CM | POA: Diagnosis not present

## 2017-04-14 DIAGNOSIS — Z3400 Encounter for supervision of normal first pregnancy, unspecified trimester: Secondary | ICD-10-CM

## 2017-04-14 NOTE — Progress Notes (Signed)
   PRENATAL VISIT NOTE  Subjective:  Emily Levine is a 22 y.o. G1P0 at 1223w0d being seen today for ongoing prenatal care.  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  Patient reports no complaints.  Contractions: Not present. Vag. Bleeding: None.  Movement: Present. Denies leaking of fluid.   The following portions of the patient's history were reviewed and updated as appropriate: allergies, current medications, past family history, past medical history, past social history, past surgical history and problem list. Problem list updated.  Objective:   Vitals:   04/14/17 1622  BP: (!) 101/58  Pulse: 69  Weight: 162 lb 14.4 oz (73.9 kg)    Fetal Status: Fetal Heart Rate (bpm): 145 Fundal Height: 25 cm Movement: Present     General:  Alert, oriented and cooperative. Patient is in no acute distress.  Skin: Skin is warm and dry. No rash noted.   Cardiovascular: Normal heart rate noted  Respiratory: Normal respiratory effort, no problems with respiration noted  Abdomen: Soft, gravid, appropriate for gestational age.  Pain/Pressure: Present     Pelvic: Cervical exam deferred        Extremities: Normal range of motion.  Edema: Trace  Mental Status:  Normal mood and affect. Normal behavior. Normal judgment and thought content.   Assessment and Plan:  Pregnancy: G1P0 at 5223w0d  1. Supervision of normal first pregnancy, antepartum  - Tdap vaccine greater than or equal to 7yo IM - 2 hour GTT at next visit   Preterm labor symptoms and general obstetric precautions including but not limited to vaginal bleeding, contractions, leaking of fluid and fetal movement were reviewed in detail with the patient. Please refer to After Visit Summary for other counseling recommendations.  Return in about 4  weeks (around 05/12/2017).   Thressa ShellerHeather Hogan, CNM

## 2017-04-14 NOTE — Progress Notes (Signed)
Tdap given on 04/14/17

## 2017-04-14 NOTE — Patient Instructions (Signed)
AREA PEDIATRIC/FAMILY [place] [address], [address] [place], [place]  [address] Phone - [phone]   Fax - [phone]  [facility] [place] [address] [place], [place] [address] Phone - [phone]   Fax - [place] [address] [place], [place]  [address] Phone - [phone]   Fax - [phone]  [place]. [address] [place] [address] [place], [place]  [address] Phone - [phone]   Fax - [phone]  [place] [address] [place], [place]  [address] Phone - [phone]   Fax - [phone]  [facility] [address] [place], [place]  [address] Phone - [phone]   Fax - [place] [address] [place], [place]  [address] Phone - [phone]   Fax - [phone]  [place] [address] [place], [place] [address] [place], [place]  [address] Phone - [phone]   Fax - [place] [address] [place], [place]  [address] Phone - [phone]   Fax - [phone] [facility] [place]. [address] [place], [place]  [address] Phone - [phone]   Fax - [phone]  [place] [address] [place], [place]  [address] Phone - [phone]   Fax - [phone]  [facility] [place], [place], [place]  [address] Phone - [phone]   Fax - [place] [address] [place], [place]  [address] Phone - [phone]   Fax - [phone]  [facility] [address] [place], [place]  [address] Phone - [place], [place]  [address] Phone - [phone]   Fax - [place] [address] [place], [place]  [address] Phone - [phone]   Fax - [phone]  [place] [address] [place], [place]  [address] Phone - [phone]   Fax - [place]. [place], [place]  [address] Phone - [phone]   Fax - [place], [place], [place]  [address] Phone - [phone]   Fax - [place] [address], [place], [place]  [address] Phone - [phone]   Fax - [phone]  [place] [address] [place], [place]  [address] Phone - [phone]   Fax - [place] [address] [place], [place]  [address] Phone - [phone]   Fax - [phone]  [place] [address] [place], [place]  [address] Phone - [phone]   Fax - [phone] [place] [address] [place], [place]  [address] Phone - [phone]   Fax - [place] [address] [place], [place]  [address] Phone - [phone]   Fax - [place] [address] [place], [place]  [address] Phone - [phone]   Fax - [phone]  [place] [address] [place] [address] Phone [phone]  Fax [phone]  Places to have your son circumcised:    [facility] [phone] while you  are in hospital  [facility] [phone] $244 by 4 wks  [facility] [phone] $175 by 2 wks  [place] [phone] $250 by 7 days [facility] [phone] $269 by 4 wks  These prices sometimes change but are roughly what you can expect to pay. Please  call and confirm pricing.   Circumcision is considered an elective/non-medically necessary procedure. There are many reasons parents decide to have their sons circumsized. During the first year of life circumcised males have a reduced risk of urinary tract infections but after this year the rates between circumcised males and uncircumcised males are the same.  It is safe to have your son circumcised outside of the hospital and the places above perform them regularly.   Deciding about Circumcision in Baby Boys  (Up-to-date The Basics)  What is circumcision?  Circumcision is a surgery that removes the skin that covers the tip of the penis, called the "foreskin" Circumcision is usually done when a boy is between 42 and 85 days old. In the [place], circumcision is common. In some other countries, fewer boys are circumcised. Circumcision is a common tradition in some religions.  Should I have my baby boy circumcised?  There is no easy answer. Circumcision has some benefits. But it also has risks. After talking with your doctor, you will have to decide for yourself what is right for your family.  What are the benefits of circumcision?  Circumcised boys seem to have slightly lower rates of: ?Urinary tract infections ?Swelling of the opening at the tip of the penis Circumcised men seem to have slightly lower rates of: ?Urinary tract infections ?Swelling of the opening at the tip of the penis ?Penis cancer ?HIV and other infections that you catch during sex ?Cervical cancer in the women they have sex with Even so, in the [place], the risks of these problems are small - even in boys and men who have not been circumcised. Plus, boys and men who are not circumcised can reduce these extra risks by: ?Cleaning their penis well ?Using condoms during sex  What are the risks of circumcision?  Risks include: ?Bleeding or infection from the surgery ?Damage to or amputation of the  penis ?A chance that the doctor will cut off too much or not enough of the foreskin ?A chance that sex won't feel as good later in life Only about 1 out of every 200 circumcisions leads to problems. There is also a chance that your health insurance won't pay for circumcision.  How is circumcision done in baby boys?  First, the baby gets medicine for pain relief. This might be a cream on the skin or a shot into the base of the penis. Next, the doctor cleans the baby's penis well. Then he or she uses special tools to cut off the foreskin. Finally, the doctor wraps a bandage (called gauze) around the baby's penis. If you have your baby circumcised, his doctor or nurse will give you instructions on how to care for him after the surgery. It is important that you follow those instructions carefully. Childbirth Education Options: [facility] Department Classes:  Childbirth education classes can help you get ready for a positive parenting experience. You can also meet other expectant parents and get free stuff for your baby. Each class runs for five weeks on the same night and costs $45 for the mother-to-be and her support person. Medicaid covers the cost if you are eligible. Call [phone] to register. [facility] Childbirth Education:  [phone]  or [phone] or [email or web address]  Baby & Me Class: Discuss newborn & infant parenting and family adjustment issues with other new mothers in a relaxed environment. Each week brings a new speaker or baby-centered activity. We encourage new mothers to join us every Thursday at 11:00am. Babies birth until crawling. No registration or fee. Daddy Boot Camp: This course offers Dads-to-be the tools and knowledge needed to feel confident on their journey to becoming new fathers. Experienced dads, who have been trained as coaches, teach dads-to-be how to hold, comfort, diaper, swaddle and play with their infant while being able to support  the new mom as well. A class for men taught by men. $25/dad Big Brother/Big Sister: Let your children share in the joy of a new brother or sister in this special class designed just for them. Class includes discussion about how families care for babies: swaddling, holding, diapering, safety as well as how they can be helpful in their new role. This class is designed for children ages 2 to 6, but any age is welcome. Please register each child individually. $5/child  Mom Talk: This mom-led group offers support and connection to mothers as they journey through the adjustments and struggles of that sometimes overwhelming first year after the birth of a child. Tuesdays at 10:00am and Thursdays at 6:00pm. Babies welcome. No registration or fee. Breastfeeding Support Group: This group is a mother-to-mother support circle where moms have the opportunity to share their breastfeeding experiences. A Lactation Consultant is present for questions and concerns. Meets each Tuesday at 11:00am. No fee or registration. Breastfeeding Your Baby: Learn what to expect in the first days of breastfeeding your newborn.  This class will help you feel more confident with the skills needed to begin your breastfeeding experience. Many new mothers are concerned about breastfeeding after leaving the hospital. This class will also address the most common fears and challenges about breastfeeding during the first few weeks, months and beyond. (call for fee) Comfort Techniques and Tour: This 2 hour interactive class will provide you the opportunity to learn & practice hands-on techniques that can help relieve some of the discomfort of labor and encourage your baby to rotate toward the best position for birth. You and your partner will be able to try a variety of labor positions with birth balls and rebozos as well as practice breathing, relaxation, and visualization techniques. A tour of the [facility] is included  with this class. $20 per registrant and support person Childbirth Class- Weekend Option: This class is a Weekend version of our Birth & Baby series. It is designed for parents who have a difficult time fitting several weeks of classes into their schedule. It covers the care of your newborn and the basics of labor and childbirth. It also includes a Maternity Care Center Tour of [facility] and lunch. The class is held two consecutive days: beginning on Friday evening from 6:30 - 8:30 p.m. and the next day, Saturday from 9 a.m. - 4 p.m. (call for fee) Waterbirth Class: Interested in a waterbirth?  This informational class will help you discover whether waterbirth is the right fit for you. Education about waterbirth itself, supplies you would need and how to assemble your support team is what you can expect from this class. Some obstetrical practices require this class in order to pursue a waterbirth. (Not all obstetrical practices offer waterbirth-check with your healthcare provider.) Register only the expectant mom, but you are encouraged to bring your partner to   class! Required if planning waterbirth, no fee. Infant/Child CPR: Parents, grandparents, babysitters, and friends learn Cardio-Pulmonary Resuscitation skills for infants and children. You will also learn how to treat both conscious and unconscious choking in infants and children. This Family & Friends program does not offer certification. Register each participant individually to ensure that enough mannequins are available. (Call for fee) Grandparent Love: Expecting a grandbaby? This class is for you! Learn about the latest infant care and safety recommendations and ways to support your own child as he or she transitions into the parenting role. Taught by Registered Nurses who are childbirth instructors, but most importantly...they are grandmothers too! $10/person. Childbirth Class- Natural Childbirth: This series of 5 weekly classes is for  expectant parents who want to learn and practice natural methods of coping with the process of labor and childbirth. Relaxation, breathing, massage, visualization, role of the partner, and helpful positioning are highlighted. Participants learn how to be confident in their body's ability to give birth. This class will empower and help parents make informed decisions about their own care. Includes discussion that will help new parents transition into the immediate postpartum period. [facility] is included. We suggest taking this class between 25-32 weeks, but it's only a recommendation. $75 per registrant and one support person or $30 Medicaid. Childbirth Class- 3 week Series: This option of 3 weekly classes helps you and your labor partner prepare for childbirth. Newborn care, labor & birth, cesarean birth, pain management, and comfort techniques are discussed and a [place] [facility] is included. The class meets at the same time, on the same day of the week for 3 consecutive weeks beginning with the starting date you choose. $60 for registrant and one support person.  Marvelous Multiples: Expecting twins, triplets, or more? This class covers the differences in labor, birth, parenting, and breastfeeding issues that face multiples' parents. NICU tour is included. Led by a Certified Childbirth Educator who is the mother of twins. No fee. Caring for Baby: This class is for expectant and adoptive parents who want to learn and practice the most up-to-date newborn care for their babies. Focus is on birth through the first six weeks of life. Topics include feeding, bathing, diapering, crying, umbilical cord care, circumcision care and safe sleep. Parents learn to recognize symptoms of illness and when to call the pediatrician. Register only the mom-to-be and your partner or support person can plan to come with you! $10 per registrant and support  person Childbirth Class- online option: This online class offers you the freedom to complete a Birth and Baby series in the comfort of your own home. The flexibility of this option allows you to review sections at your own pace, at times convenient to you and your support people. It includes additional video information, animations, quizzes, and extended activities. Get organized with helpful eClass tools, checklists, and trackers. Once you register online for the class, you will receive an email within a few days to accept the invitation and begin the class when the time is right for you. The content will be available to you for 60 days. $60 for 60 days of online access for you and your support people.  Local Doulas: Natural Baby Doulas [email or web address] Tel: [phone] [email or web address] [employer] [phone] [email or web address] .[email or web address] The Labor Hassell Halim  (also do waterbirth tub rental) [phone] [email or web address] Triad Birth Doula [phone] [email or web address] [email or web address].fi Sacred Rhythms  [phone] https://[email or web address]-[email or web address]/ [employer] (PADA) [email or web address] https://www.frey.org/ [place]  [place] Birth and Baby  http://[email or web address]/ Considering Waterbirth? Guide for patients at [facility] [employer]  Why consider waterbirth?  . Gentle birth for babies . Less pain medicine used in labor . May allow for passive descent/less pushing . May reduce perineal tears  . More mobility and instinctive maternal position changes . Increased maternal relaxation . Reduced blood pressure in labor  Is waterbirth safe? What are the risks of infection, drowning or other complications?  . Infection: o Very low risk (3.7 % for tub vs 4.8% for bed) o 7 in 8000 waterbirths with documented infection o Poorly  cleaned equipment most common cause o Slightly lower group B strep transmission rate  . Drowning o Maternal:  - Very low risk   - Related to seizures or fainting o Newborn:  - Very low risk. No evidence of increased risk of respiratory problems in multiple large studies - Physiological protection from breathing under water - Avoid underwater birth if there are any fetal complications - Once baby's head is out of the water, keep it out.  . Birth complication o Some reports of cord trauma, but risk decreased by bringing baby to surface gradually o No evidence of increased risk of shoulder dystocia. Mothers can usually change positions faster in water than in a bed, possibly aiding the maneuvers to free the shoulder.   You must attend a Doren Custard class at [facility]  3rd Wednesday of every month from 7-9pm  [employer] by calling [phone] or online at [email or web address]  Bring [place] the certificate from the class to your prenatal appointment  Meet with a midwife at 36 weeks to see if you can still plan a waterbirth and to sign the consent.   Purchase or rent the following supplies:   Water Birth Pool (Birth Pool in a Box or [place] for instance)  (Tubs start ~$125)  Single-use disposable tub liner designed for your brand of tub  New garden hose labeled "lead-free", "suitable for drinking water",  Electric drain pump to remove water (We recommend 792 gallon per hour or greater pump.)   Separate garden hose to remove the dirty water  Fish net  Bathing suit top (optional)  Long-handled mirror (optional)  Places to purchase or rent supplies  GotWebTools.is for tub purchases and supplies  Waterbirthsolutions.com for tub purchases and supplies  The Labor Ladies (www.thelaborladies.com) $275 for tub rental/set-up & take down/kit   [employer] (http://www.fleming.com/.htm) Information regarding doulas (labor support) who  provide pool rentals  Our practice has a Birth Pool in a Box tub at the hospital that you may borrow on a first-come-first-served basis. It is your responsibility to to set up, clean and break down the tub. We cannot guarantee the availability of this tub in advance. You are responsible for bringing all accessories listed above. If you do not have all necessary supplies you cannot have a waterbirth.    Things that would prevent you from having a waterbirth:  Premature, <37wks  Previous cesarean birth  Presence of thick meconium-stained fluid  Multiple gestation (Twins, triplets, etc.)  Uncontrolled diabetes or gestational diabetes requiring medication  Hypertension requiring medication or diagnosis of pre-eclampsia  Heavy vaginal bleeding  Non-reassuring fetal heart rate  Active infection (MRSA, etc.). Group B Strep is NOT a contraindication for  waterbirth.  If your labor has to be induced and induction method requires continuous  monitoring of the baby's heart rate  Other risks/issues identified by your obstetrical provider  Please remember  that birth is unpredictable. Under certain unforeseeable circumstances your provider may advise against giving birth in the tub. These decisions will be made on a case-by-case basis and with the safety of you and your baby as our highest priority.   Safe Medications in Pregnancy   Acne: Benzoyl Peroxide Salicylic Acid  Backache/Headache: Tylenol: 2 regular strength every 4 hours OR              2 Extra strength every 6 hours  Colds/Coughs/Allergies: Benadryl (alcohol free) 25 mg every 6 hours as needed Breath right strips Claritin Cepacol throat lozenges Chloraseptic throat spray Cold-Eeze- up to three times per day Cough drops, alcohol free Flonase (by prescription only) Guaifenesin Mucinex Robitussin DM (plain only, alcohol free) Saline nasal spray/drops Sudafed (pseudoephedrine) & Actifed ** use only after [redacted] weeks  gestation and if you do not have high blood pressure Tylenol Vicks Vaporub Zinc lozenges Zyrtec   Constipation: Colace Ducolax suppositories Fleet enema Glycerin suppositories Metamucil Milk of magnesia Miralax Senokot Smooth move tea  Diarrhea: Kaopectate Imodium A-D  *NO pepto Bismol  Hemorrhoids: Anusol Anusol HC Preparation H Tucks  Indigestion: Tums Maalox Mylanta Zantac  Pepcid  Insomnia: Benadryl (alcohol free) 25mg every 6 hours as needed Tylenol PM Unisom, no Gelcaps  Leg Cramps: Tums MagGel  Nausea/Vomiting:  [place] Dramamine Emetrol Ginger extract Sea bands Meclizine  Nausea medication to take during pregnancy:  Unisom (doxylamine succinate 25 mg tablets) Take one tablet daily at bedtime. If symptoms are not adequately controlled, the dose can be increased to a maximum recommended dose of two tablets daily (1/2 tablet in the morning, 1/2 tablet mid-afternoon and one at bedtime). Vitamin B6 100mg tablets. Take one tablet twice a day (up to 200 mg per day).  Skin Rashes: Aveeno products Benadryl cream or 25mg every 6 hours as needed Calamine Lotion 1% cortisone cream  Yeast infection: Gyne-lotrimin 7 Monistat 7   **If taking multiple medications, please check labels to avoid duplicating the same active ingredients **take medication as directed on the label ** Do not exceed 4000 mg of tylenol in 24 hours **Do not take medications that contain aspirin or ibuprofen     

## 2017-04-28 ENCOUNTER — Encounter: Payer: Self-pay | Admitting: Advanced Practice Midwife

## 2017-04-28 ENCOUNTER — Other Ambulatory Visit: Payer: Self-pay

## 2017-04-28 ENCOUNTER — Ambulatory Visit (INDEPENDENT_AMBULATORY_CARE_PROVIDER_SITE_OTHER): Payer: Medicaid Other | Admitting: Advanced Practice Midwife

## 2017-04-28 DIAGNOSIS — Z3402 Encounter for supervision of normal first pregnancy, second trimester: Secondary | ICD-10-CM

## 2017-04-28 DIAGNOSIS — Z3400 Encounter for supervision of normal first pregnancy, unspecified trimester: Secondary | ICD-10-CM

## 2017-04-28 MED ORDER — FAMOTIDINE 40 MG PO TABS: 40.0000 mg | ORAL_TABLET | Freq: Every day | ORAL | 5 refills | Status: DC

## 2017-04-28 MED ORDER — PROMETHAZINE HCL 25 MG PO TABS
12.5000 mg | ORAL_TABLET | Freq: Four times a day (QID) | ORAL | 0 refills | Status: DC | PRN
Start: 2017-04-28 — End: 2017-07-14

## 2017-04-28 NOTE — Progress Notes (Signed)
   PRENATAL VISIT NOTE  Subjective:  Emily Levine is a 22 y.o. G1P0 at 1541w0d being seen today for ongoing prenatal care.  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  Patient reports backache and lower abdominal pain . Heartburn at night, and some occasional vomiting/reflux as well.  Contractions: Not present. Vag. Bleeding: None.  Movement: Present. Denies leaking of fluid.   The following portions of the patient's history were reviewed and updated as appropriate: allergies, current medications, past family history, past medical history, past social history, past surgical history and problem list. Problem list updated.  Objective:   Vitals:   04/28/17 1044  BP: 100/63  Pulse: 63  Weight: 166 lb 6.4 oz (75.5 kg)    Fetal Status: Fetal Heart Rate (bpm): 140   Movement: Present     General:  Alert, oriented and cooperative. Patient is in no acute distress.  Skin: Skin is warm and dry. No rash noted.   Cardiovascular: Normal heart rate noted  Respiratory: Normal respiratory effort, no problems with respiration noted  Abdomen: Soft, gravid, appropriate for gestational age.  Pain/Pressure: Present     Pelvic: Cervical exam deferred        Extremities: Normal range of motion.  Edema: Trace  Mental Status:  Normal mood and affect. Normal behavior. Normal judgment and thought content.   Assessment and Plan:  Pregnancy: G1P0 at 7741w0d  1. Supervision of normal first pregnancy, antepartum - Glucose Tolerance, 2 Hours w/1 Hour - CBC - HIV antibody - RPR - Genetic Screening - RX pepcid and phenergan    Preterm labor symptoms and general obstetric precautions including but not limited to vaginal bleeding, contractions, leaking of fluid and fetal movement were reviewed in detail with the  patient. Please refer to After Visit Summary for other counseling recommendations.  Return in about 2 weeks (around 05/12/2017).   Thressa ShellerHeather Doyal Saric, CNM

## 2017-04-28 NOTE — Progress Notes (Signed)
Pt states is having pain in lower abdomen & it hurts for her to walk.

## 2017-04-28 NOTE — Patient Instructions (Signed)
AREA PEDIATRIC/FAMILY [place] [address], [address] [place], [place]  [address] Phone - [phone]   Fax - [phone]  [facility] [place] [address] [place], [place] [address] Phone - [phone]   Fax - [place] [address] [place], [place]  [address] Phone - [phone]   Fax - [phone]  [place]. [address] [place] [address] [place], [place]  [address] Phone - [phone]   Fax - [phone]  [place] [address] [place], [place]  [address] Phone - [phone]   Fax - [phone]  [facility] [address] [place], [place]  [address] Phone - [phone]   Fax - [place] [address] [place], [place]  [address] Phone - [phone]   Fax - [phone]  [place] [address] [place], [place] [address] [place], [place]  [address] Phone - [phone]   Fax - [place] [address] [place], [place]  [address] Phone - [phone]   Fax - [phone] [facility] [place]. [address] [place], [place]  [address] Phone - [phone]   Fax - [phone]  [place] [address] [place], [place]  [address] Phone - [phone]   Fax - [phone]  [facility] [place], [place], [place]  [address] Phone - [phone]   Fax - [place] [address] [place], [place]  [address] Phone - [phone]   Fax - [phone]  [facility] [address] [place], [place]  [address] Phone - [place], [place]  [address] Phone - [phone]   Fax - [place] [address] [place], [place]  [address] Phone - [phone]   Fax - [phone]  [place] [address] [place], [place]  [address] Phone - [phone]   Fax - [place]. [place], [place]  [address] Phone - [phone]   Fax - [place], [place], [place]  [address] Phone - [phone]   Fax - [place] [address], [place], [place]  [address] Phone - [phone]   Fax - [phone]  [place] [address] [place], [place]  [address] Phone - [phone]   Fax - [place] [address] [place], [place]  [address] Phone - [phone]   Fax - [phone]  [place] [address] [place], [place]  [address] Phone - [phone]   Fax - [phone] [place] [address] [place], [place]  [address] Phone - [phone]   Fax - [place] [address] [place], [place]  [address] Phone - [phone]   Fax - [place] [address] [place], [place]  [address] Phone - [phone]   Fax - [phone]  [place] [address] [place] [address] Phone [phone]  Fax [phone]  Places to have your son circumcised:    [facility] [phone] while you  are in hospital  [facility] [phone] $244 by 4 wks  [facility] [phone] $175 by 2 wks  [place] [phone] $250 by 7 days [facility] [phone] $269 by 4 wks  These prices sometimes change but are roughly what you can expect to pay. Please  call and confirm pricing.   Circumcision is considered an elective/non-medically necessary procedure. There are many reasons parents decide to have their sons circumsized. During the first year of life circumcised males have a reduced risk of urinary tract infections but after this year the rates between circumcised males and uncircumcised males are the same.  It is safe to have your son circumcised outside of the hospital and the places above perform them regularly.   Deciding about Circumcision in Baby Boys  (Up-to-date The Basics)  What is circumcision?  Circumcision is a surgery that removes the skin that covers the tip of the penis, called the "foreskin" Circumcision is usually done when a boy is between 42 and 85 days old. In the [place], circumcision is common. In some other countries, fewer boys are circumcised. Circumcision is a common tradition in some religions.  Should I have my baby boy circumcised?  There is no easy answer. Circumcision has some benefits. But it also has risks. After talking with your doctor, you will have to decide for yourself what is right for your family.  What are the benefits of circumcision?  Circumcised boys seem to have slightly lower rates of: ?Urinary tract infections ?Swelling of the opening at the tip of the penis Circumcised men seem to have slightly lower rates of: ?Urinary tract infections ?Swelling of the opening at the tip of the penis ?Penis cancer ?HIV and other infections that you catch during sex ?Cervical cancer in the women they have sex with Even so, in the [place], the risks of these problems are small - even in boys and men who have not been circumcised. Plus, boys and men who are not circumcised can reduce these extra risks by: ?Cleaning their penis well ?Using condoms during sex  What are the risks of circumcision?  Risks include: ?Bleeding or infection from the surgery ?Damage to or amputation of the  penis ?A chance that the doctor will cut off too much or not enough of the foreskin ?A chance that sex won't feel as good later in life Only about 1 out of every 200 circumcisions leads to problems. There is also a chance that your health insurance won't pay for circumcision.  How is circumcision done in baby boys?  First, the baby gets medicine for pain relief. This might be a cream on the skin or a shot into the base of the penis. Next, the doctor cleans the baby's penis well. Then he or she uses special tools to cut off the foreskin. Finally, the doctor wraps a bandage (called gauze) around the baby's penis. If you have your baby circumcised, his doctor or nurse will give you instructions on how to care for him after the surgery. It is important that you follow those instructions carefully. Childbirth Education Options: [facility] Department Classes:  Childbirth education classes can help you get ready for a positive parenting experience. You can also meet other expectant parents and get free stuff for your baby. Each class runs for five weeks on the same night and costs $45 for the mother-to-be and her support person. Medicaid covers the cost if you are eligible. Call [phone] to register. [facility] Childbirth Education:  [phone]  or [phone] or [email or web address]  Baby & Me Class: Discuss newborn & infant parenting and family adjustment issues with other new mothers in a relaxed environment. Each week brings a new speaker or baby-centered activity. We encourage new mothers to join us every Thursday at 11:00am. Babies birth until crawling. No registration or fee. Daddy Boot Camp: This course offers Dads-to-be the tools and knowledge needed to feel confident on their journey to becoming new fathers. Experienced dads, who have been trained as coaches, teach dads-to-be how to hold, comfort, diaper, swaddle and play with their infant while being able to support  the new mom as well. A class for men taught by men. $25/dad Big Brother/Big Sister: Let your children share in the joy of a new brother or sister in this special class designed just for them. Class includes discussion about how families care for babies: swaddling, holding, diapering, safety as well as how they can be helpful in their new role. This class is designed for children ages 2 to 6, but any age is welcome. Please register each child individually. $5/child  Mom Talk: This mom-led group offers support and connection to mothers as they journey through the adjustments and struggles of that sometimes overwhelming first year after the birth of a child. Tuesdays at 10:00am and Thursdays at 6:00pm. Babies welcome. No registration or fee. Breastfeeding Support Group: This group is a mother-to-mother support circle where moms have the opportunity to share their breastfeeding experiences. A Lactation Consultant is present for questions and concerns. Meets each Tuesday at 11:00am. No fee or registration. Breastfeeding Your Baby: Learn what to expect in the first days of breastfeeding your newborn.  This class will help you feel more confident with the skills needed to begin your breastfeeding experience. Many new mothers are concerned about breastfeeding after leaving the hospital. This class will also address the most common fears and challenges about breastfeeding during the first few weeks, months and beyond. (call for fee) Comfort Techniques and Tour: This 2 hour interactive class will provide you the opportunity to learn & practice hands-on techniques that can help relieve some of the discomfort of labor and encourage your baby to rotate toward the best position for birth. You and your partner will be able to try a variety of labor positions with birth balls and rebozos as well as practice breathing, relaxation, and visualization techniques. A tour of the [facility] is included  with this class. $20 per registrant and support person Childbirth Class- Weekend Option: This class is a Weekend version of our Birth & Baby series. It is designed for parents who have a difficult time fitting several weeks of classes into their schedule. It covers the care of your newborn and the basics of labor and childbirth. It also includes a Maternity Care Center Tour of [facility] and lunch. The class is held two consecutive days: beginning on Friday evening from 6:30 - 8:30 p.m. and the next day, Saturday from 9 a.m. - 4 p.m. (call for fee) Waterbirth Class: Interested in a waterbirth?  This informational class will help you discover whether waterbirth is the right fit for you. Education about waterbirth itself, supplies you would need and how to assemble your support team is what you can expect from this class. Some obstetrical practices require this class in order to pursue a waterbirth. (Not all obstetrical practices offer waterbirth-check with your healthcare provider.) Register only the expectant mom, but you are encouraged to bring your partner to   class! Required if planning waterbirth, no fee. Infant/Child CPR: Parents, grandparents, babysitters, and friends learn Cardio-Pulmonary Resuscitation skills for infants and children. You will also learn how to treat both conscious and unconscious choking in infants and children. This Family & Friends program does not offer certification. Register each participant individually to ensure that enough mannequins are available. (Call for fee) Grandparent Love: Expecting a grandbaby? This class is for you! Learn about the latest infant care and safety recommendations and ways to support your own child as he or she transitions into the parenting role. Taught by Registered Nurses who are childbirth instructors, but most importantly...they are grandmothers too! $10/person. Childbirth Class- Natural Childbirth: This series of 5 weekly classes is for  expectant parents who want to learn and practice natural methods of coping with the process of labor and childbirth. Relaxation, breathing, massage, visualization, role of the partner, and helpful positioning are highlighted. Participants learn how to be confident in their body's ability to give birth. This class will empower and help parents make informed decisions about their own care. Includes discussion that will help new parents transition into the immediate postpartum period. [facility] is included. We suggest taking this class between 25-32 weeks, but it's only a recommendation. $75 per registrant and one support person or $30 Medicaid. Childbirth Class- 3 week Series: This option of 3 weekly classes helps you and your labor partner prepare for childbirth. Newborn care, labor & birth, cesarean birth, pain management, and comfort techniques are discussed and a [place] [facility] is included. The class meets at the same time, on the same day of the week for 3 consecutive weeks beginning with the starting date you choose. $60 for registrant and one support person.  Marvelous Multiples: Expecting twins, triplets, or more? This class covers the differences in labor, birth, parenting, and breastfeeding issues that face multiples' parents. NICU tour is included. Led by a Certified Childbirth Educator who is the mother of twins. No fee. Caring for Baby: This class is for expectant and adoptive parents who want to learn and practice the most up-to-date newborn care for their babies. Focus is on birth through the first six weeks of life. Topics include feeding, bathing, diapering, crying, umbilical cord care, circumcision care and safe sleep. Parents learn to recognize symptoms of illness and when to call the pediatrician. Register only the mom-to-be and your partner or support person can plan to come with you! $10 per registrant and support  person Childbirth Class- online option: This online class offers you the freedom to complete a Birth and Baby series in the comfort of your own home. The flexibility of this option allows you to review sections at your own pace, at times convenient to you and your support people. It includes additional video information, animations, quizzes, and extended activities. Get organized with helpful eClass tools, checklists, and trackers. Once you register online for the class, you will receive an email within a few days to accept the invitation and begin the class when the time is right for you. The content will be available to you for 60 days. $60 for 60 days of online access for you and your support people.  Local Doulas: Natural Baby Doulas [email or web address] Tel: [phone] [email or web address] [employer] [phone] [email or web address] .[email or web address] www.[email or web address].com The Labor Hassell Halim  (also do waterbirth tub rental) [phone] [email or web address] Triad Birth Doula [phone] [email or web address] NotebookDistributors.fi Sacred Rhythms  [phone] https://[email or web address]-[email or web address]/ [employer] (PADA) [email or web address] https://www.frey.org/ [place] Birth and Baby  http://[email or web address]/ Considering Waterbirth? Guide for patients at [facility] [employer]  Why consider waterbirth?  . Gentle birth for babies . Less pain medicine used in labor . May allow for passive descent/less pushing . May reduce perineal tears  . More mobility and instinctive maternal position changes . Increased maternal relaxation . Reduced blood pressure in labor  Is waterbirth safe? What are the risks of infection, drowning or other complications?  . Infection: o Very low risk (3.7 % for tub vs 4.8% for bed) o 7 in 8000 waterbirths with documented infection o Poorly  cleaned equipment most common cause o Slightly lower group B strep transmission rate  . Drowning o Maternal:  - Very low risk   - Related to seizures or fainting o Newborn:  - Very low risk. No evidence of increased risk of respiratory problems in multiple large studies - Physiological protection from breathing under water - Avoid underwater birth if there are any fetal complications - Once baby's head is out of the water, keep it out.  . Birth complication o Some reports of cord trauma, but risk decreased by bringing baby to surface gradually o No evidence of increased risk of shoulder dystocia. Mothers can usually change positions faster in water than in a bed, possibly aiding the maneuvers to free the shoulder.   You must attend a Doren Custard class at [facility]  3rd Wednesday of every month from 7-9pm  [employer] by calling [phone] or online at [email or web address]  Bring [place] the certificate from the class to your prenatal appointment  Meet with a midwife at 36 weeks to see if you can still plan a waterbirth and to sign the consent.   Purchase or rent the following supplies:   Water Birth Pool (Birth Pool in a Box or [place] for instance)  (Tubs start ~$125)  Single-use disposable tub liner designed for your brand of tub  New garden hose labeled "lead-free", "suitable for drinking water",  Electric drain pump to remove water (We recommend 792 gallon per hour or greater pump.)   Separate garden hose to remove the dirty water  Fish net  Bathing suit top (optional)  Long-handled mirror (optional)  Places to purchase or rent supplies  GotWebTools.is for tub purchases and supplies  Waterbirthsolutions.com for tub purchases and supplies  The Labor Ladies (www.thelaborladies.com) $275 for tub rental/set-up & take down/kit   [employer] (http://www.fleming.com/.htm) Information regarding doulas (labor support) who  provide pool rentals  Our practice has a Birth Pool in a Box tub at the hospital that you may borrow on a first-come-first-served basis. It is your responsibility to to set up, clean and break down the tub. We cannot guarantee the availability of this tub in advance. You are responsible for bringing all accessories listed above. If you do not have all necessary supplies you cannot have a waterbirth.    Things that would prevent you from having a waterbirth:  Premature, <37wks  Previous cesarean birth  Presence of thick meconium-stained fluid  Multiple gestation (Twins, triplets, etc.)  Uncontrolled diabetes or gestational diabetes requiring medication  Hypertension requiring medication or diagnosis of pre-eclampsia  Heavy vaginal bleeding  Non-reassuring fetal heart rate  Active infection (MRSA, etc.). Group B Strep is NOT a contraindication for  waterbirth.  If your labor has to be induced and induction method requires continuous  monitoring of the baby's heart rate  Other risks/issues identified by your obstetrical provider  Please remember  that birth is unpredictable. Under certain unforeseeable circumstances your provider may advise against giving birth in the tub. These decisions will be made on a case-by-case basis and with the safety of you and your baby as our highest priority.      

## 2017-04-29 LAB — HIV ANTIBODY (ROUTINE TESTING W REFLEX): HIV Screen 4th Generation wRfx: NONREACTIVE

## 2017-04-29 LAB — GLUCOSE TOLERANCE, 2 HOURS W/ 1HR
GLUCOSE, 2 HOUR: 74 mg/dL (ref 65–152)
Glucose, 1 hour: 95 mg/dL (ref 65–179)
Glucose, Fasting: 74 mg/dL (ref 65–91)

## 2017-04-29 LAB — CBC
HEMATOCRIT: 36.2 % (ref 34.0–46.6)
HEMOGLOBIN: 12.1 g/dL (ref 11.1–15.9)
MCH: 30.9 pg (ref 26.6–33.0)
MCHC: 33.4 g/dL (ref 31.5–35.7)
MCV: 92 fL (ref 79–97)
Platelets: 172 10*3/uL (ref 150–379)
RBC: 3.92 x10E6/uL (ref 3.77–5.28)
RDW: 13 % (ref 12.3–15.4)
WBC: 9.8 10*3/uL (ref 3.4–10.8)

## 2017-04-29 LAB — RPR: RPR: NONREACTIVE

## 2017-04-30 ENCOUNTER — Encounter: Payer: Self-pay | Admitting: *Deleted

## 2017-05-05 ENCOUNTER — Encounter: Payer: Self-pay | Admitting: *Deleted

## 2017-05-12 ENCOUNTER — Encounter: Payer: Self-pay | Admitting: Advanced Practice Midwife

## 2017-05-26 ENCOUNTER — Ambulatory Visit (INDEPENDENT_AMBULATORY_CARE_PROVIDER_SITE_OTHER): Payer: Medicaid Other | Admitting: Advanced Practice Midwife

## 2017-05-26 ENCOUNTER — Encounter: Payer: Self-pay | Admitting: Advanced Practice Midwife

## 2017-05-26 VITALS — BP 111/59 | HR 76 | Wt 177.0 lb

## 2017-05-26 DIAGNOSIS — O368130 Decreased fetal movements, third trimester, not applicable or unspecified: Secondary | ICD-10-CM

## 2017-05-26 DIAGNOSIS — Z3403 Encounter for supervision of normal first pregnancy, third trimester: Secondary | ICD-10-CM

## 2017-05-26 DIAGNOSIS — Z3400 Encounter for supervision of normal first pregnancy, unspecified trimester: Secondary | ICD-10-CM

## 2017-05-26 NOTE — Progress Notes (Signed)
   PRENATAL VISIT NOTE  Subjective:  Emily Levine is a 22 y.o. G1P0 at 940w0d being seen today for ongoing prenatal care.  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  Patient reports no complaints.  Contractions: Not present. Vag. Bleeding: None.  Movement: (!) Decreased. Denies leaking of fluid.   The following portions of the patient's history were reviewed and updated as appropriate: allergies, current medications, past family history, past medical history, past social history, past surgical history and problem list. Problem list updated.  Objective:   Vitals:   05/26/17 1704  BP: (!) 111/59  Pulse: 76  Weight: 177 lb (80.3 kg)    Fetal Status: Fetal Heart Rate (bpm): 153 Fundal Height: 31 cm Movement: (!) Decreased     General:  Alert, oriented and cooperative. Patient is in no acute distress.  Skin: Skin is warm and dry. No rash noted.   Cardiovascular: Normal heart rate noted  Respiratory: Normal respiratory effort, no problems with respiration noted  Abdomen: Soft, gravid, appropriate for gestational age.  Pain/Pressure: Present     Pelvic: Cervical exam deferred        Extremities: Normal range of motion.  Edema: None  Mental Status:  Normal mood and affect. Normal behavior. Normal judgment and thought content.    NST: 135, moderate with 15x15 accels, no decels Toco: no UCs  Patient feeling regular fetal movement during NST Assessment and Plan:  Pregnancy: G1P0 at 5540w0d  1. Supervision of normal first pregnancy, antepartum - Routine Care  Preterm labor symptoms and general obstetric precautions including but not limited to vaginal bleeding, contractions, leaking of fluid and fetal movement were reviewed in detail with the patient. Please refer to After Visit Summary for  other counseling recommendations.  Return in about 2 weeks (around 06/09/2017).   Thressa ShellerHeather Chiquetta Langner, CNM

## 2017-05-26 NOTE — Progress Notes (Signed)
Feeling FM but less movement for past 4 days. Some lower back pain

## 2017-06-09 ENCOUNTER — Ambulatory Visit (INDEPENDENT_AMBULATORY_CARE_PROVIDER_SITE_OTHER): Payer: Medicaid Other | Admitting: Advanced Practice Midwife

## 2017-06-09 ENCOUNTER — Other Ambulatory Visit (HOSPITAL_COMMUNITY)
Admission: RE | Admit: 2017-06-09 | Discharge: 2017-06-09 | Disposition: A | Discharge status: Home / Self Care | Payer: Medicaid Other | Source: Ambulatory Visit | Attending: Advanced Practice Midwife | Admitting: Advanced Practice Midwife

## 2017-06-09 ENCOUNTER — Encounter: Payer: Self-pay | Admitting: Advanced Practice Midwife

## 2017-06-09 VITALS — BP 99/58 | HR 82 | Wt 177.4 lb

## 2017-06-09 DIAGNOSIS — N898 Other specified noninflammatory disorders of vagina: Secondary | ICD-10-CM

## 2017-06-09 DIAGNOSIS — Z3400 Encounter for supervision of normal first pregnancy, unspecified trimester: Secondary | ICD-10-CM

## 2017-06-09 MED ORDER — PRENATAL 27-0.8 MG PO TABS: 1.0000 | ORAL_TABLET | Freq: Every day | ORAL | 9 refills | Status: AC

## 2017-06-09 NOTE — Progress Notes (Signed)
Wants to be tested because she and boyfriend have bad odor, itching. C/o tenderness/ soreness on abdomen/ naval area, c/o numbness in epigastric area. Also thinks she has PUPPs due to rash on chest/ face/ stomach.

## 2017-06-09 NOTE — Progress Notes (Signed)
   PRENATAL VISIT NOTE  Subjective:  Emily Levine is a 22 y.o. G1P0 at 7166w0d being seen today for ongoing prenatal care.  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  Patient reports vaginal irritation and itching all over. .  Contractions: Not present. Vag. Bleeding: None.  Movement: Present. Denies leaking of fluid.   The following portions of the patient's history were reviewed and updated as appropriate: allergies, current medications, past family history, past medical history, past social history, past surgical history and problem list. Problem list updated.  Objective:   Vitals:   06/09/17 1543  BP: (!) 99/58  Pulse: 82  Weight: 177 lb 6.4 oz (80.5 kg)    Fetal Status: Fetal Heart Rate (bpm): 134 Fundal Height: 33 cm Movement: Present     General:  Alert, oriented and cooperative. Patient is in no acute distress.  Skin: Skin is warm and dry. No rash noted.   Cardiovascular: Normal heart rate noted  Respiratory: Normal respiratory effort, no problems with respiration noted  Abdomen: Soft, gravid, appropriate for gestational age.  Pain/Pressure: Present     Pelvic: Cervical exam deferred        Extremities: Normal range of motion.  Edema: None  Mental Status: Normal mood and affect. Normal behavior. Normal judgment and thought content.   Assessment and Plan:  Pregnancy: G1P0 at 4766w0d  1. Supervision of normal first pregnancy, antepartum - Prenatal Vit-Fe Fumarate-FA (MULTIVITAMIN-PRENATAL) 27-0.8 MG TABS tablet; Take 1 tablet by mouth daily at 12 noon.  Dispense: 30 each; Refill: 9  2. Vaginal odor - Cervicovaginal ancillary only  3. Itching - Bile acids, LFTs    Preterm labor symptoms and general obstetric precautions including but not limited to vaginal bleeding,  contractions, leaking of fluid and fetal movement were reviewed in detail with the patient. Please refer to After Visit Summary for other counseling recommendations.  Return in about 2 weeks (around 06/23/2017).  Future Appointments  Date Time Provider Department Center  06/23/2017  1:55 PM Armando ReichertHogan, Makenly Larabee D, CNM [facility]  06/30/2017 11:15 AM Armando ReichertHogan, Raeleigh Guinn D, CNM [facility]  07/07/2017 11:15 AM Armando ReichertHogan, Kilian Schwartz D, CNM [facility]  07/14/2017 11:15 AM Armando ReichertHogan, Hollee Fate D, CNM [facility]  07/21/2017 11:15 AM Armando ReichertHogan, Huber Mathers D, CNM [facility]  07/28/2017 11:15 AM Armando ReichertHogan, Jeriyah Granlund D, CNM [facility]    Thressa ShellerHeather Demetrios Byron, CNM

## 2017-06-10 LAB — CERVICOVAGINAL ANCILLARY ONLY
Bacterial vaginitis: POSITIVE — AB
Candida vaginitis: POSITIVE — AB
Chlamydia: NEGATIVE
NEISSERIA GONORRHEA: NEGATIVE
TRICH (WINDOWPATH): NEGATIVE

## 2017-06-12 ENCOUNTER — Other Ambulatory Visit: Payer: Self-pay | Admitting: Advanced Practice Midwife

## 2017-06-12 LAB — HEPATIC FUNCTION PANEL
ALT: 23 IU/L (ref 0–32)
AST: 18 IU/L (ref 0–40)
Albumin: 3.6 g/dL (ref 3.5–5.5)
Alkaline Phosphatase: 71 IU/L (ref 39–117)
Bilirubin, Direct: 0.07 mg/dL (ref 0.00–0.40)
Total Protein: 6.2 g/dL (ref 6.0–8.5)

## 2017-06-12 LAB — BILE ACIDS, TOTAL: BILE ACIDS TOTAL: 8.9 umol/L (ref 4.7–24.5)

## 2017-06-12 MED ORDER — TERCONAZOLE 0.4 % VA CREA: 1.0000 | TOPICAL_CREAM | Freq: Every day | VAGINAL | 0 refills | Status: DC

## 2017-06-12 MED ORDER — METRONIDAZOLE 500 MG PO TABS: 500.0000 mg | ORAL_TABLET | Freq: Two times a day (BID) | ORAL | 0 refills | Status: DC

## 2017-06-23 ENCOUNTER — Encounter: Payer: Self-pay | Admitting: Advanced Practice Midwife

## 2017-06-23 ENCOUNTER — Ambulatory Visit (INDEPENDENT_AMBULATORY_CARE_PROVIDER_SITE_OTHER): Payer: Medicaid Other | Admitting: Advanced Practice Midwife

## 2017-06-23 VITALS — BP 99/58 | HR 73 | Wt 181.7 lb

## 2017-06-23 DIAGNOSIS — Z3403 Encounter for supervision of normal first pregnancy, third trimester: Secondary | ICD-10-CM

## 2017-06-23 DIAGNOSIS — Z3400 Encounter for supervision of normal first pregnancy, unspecified trimester: Secondary | ICD-10-CM

## 2017-06-23 NOTE — Progress Notes (Signed)
   PRENATAL VISIT NOTE  Subjective:  Emily Levine is a 22 y.o. G1P0 at 6250w0d being seen today for ongoing prenatal care.  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  Patient reports no complaints.  Contractions: Irritability. Vag. Bleeding: None.  Movement: Present. Denies leaking of fluid.   The following portions of the patient's history were reviewed and updated as appropriate: allergies, current medications, past family history, past medical history, past social history, past surgical history and problem list. Problem list updated.  Objective:   Vitals:   06/23/17 1416  BP: (!) 99/58  Pulse: 73  Weight: 181 lb 11.2 oz (82.4 kg)    Fetal Status: Fetal Heart Rate (bpm): 141   Movement: Present     General:  Alert, oriented and cooperative. Patient is in no acute distress.  Skin: Skin is warm and dry. No rash noted.   Cardiovascular: Normal heart rate noted  Respiratory: Normal respiratory effort, no problems with respiration noted  Abdomen: Soft, gravid, appropriate for gestational age.  Pain/Pressure: Present     Pelvic: Cervical exam deferred        Extremities: Normal range of motion.  Edema: Trace  Mental Status: Normal mood and affect. Normal behavior. Normal judgment and thought content.   Assessment and Plan:  Pregnancy: G1P0 at 7150w0d  1. Supervision of normal first pregnancy, antepartum -GBS at [place]   Preterm labor symptoms and general obstetric precautions including but not limited to vaginal bleeding, contractions, leaking of fluid and fetal movement were reviewed in detail with the patient. Please refer to After Visit Summary for other counseling recommendations.  Return in about 2 weeks (around 07/07/2017).  Future Appointments  Date Time Provider Department Center    06/30/2017 11:15 AM Armando ReichertHogan, Ledarius Leeson D, CNM [facility]  07/07/2017 11:15 AM Armando ReichertHogan, Alba Kriesel D, CNM [facility]  07/14/2017 11:15 AM Armando ReichertHogan, Rhiley Solem D, CNM [facility]  07/21/2017 11:15 AM Aundria Rudogers, Rudean CurtVeronica C, CNM [facility]  07/28/2017 11:15 AM Armando ReichertHogan, Shamyah Stantz D, CNM [facility]    Thressa ShellerHeather Nubia Ziesmer, CNM

## 2017-06-30 ENCOUNTER — Encounter: Payer: Self-pay | Admitting: Advanced Practice Midwife

## 2017-07-07 ENCOUNTER — Encounter: Payer: Self-pay | Admitting: Advanced Practice Midwife

## 2017-07-13 ENCOUNTER — Other Ambulatory Visit: Payer: Self-pay

## 2017-07-13 ENCOUNTER — Encounter (HOSPITAL_COMMUNITY): Payer: Self-pay

## 2017-07-13 ENCOUNTER — Inpatient Hospital Stay (HOSPITAL_COMMUNITY)
Admission: AD | Admit: 2017-07-13 | Discharge: 2017-07-13 | Disposition: A | Discharge status: Home / Self Care | Payer: Medicaid Other | Source: Ambulatory Visit | Attending: Obstetrics & Gynecology | Admitting: Obstetrics & Gynecology

## 2017-07-13 DIAGNOSIS — R0609 Other forms of dyspnea: Secondary | ICD-10-CM

## 2017-07-13 DIAGNOSIS — R0789 Other chest pain: Secondary | ICD-10-CM | POA: Diagnosis present

## 2017-07-13 DIAGNOSIS — Z3A37 37 weeks gestation of pregnancy: Secondary | ICD-10-CM | POA: Diagnosis not present

## 2017-07-13 DIAGNOSIS — O9989 Other specified diseases and conditions complicating pregnancy, childbirth and the puerperium: Secondary | ICD-10-CM | POA: Diagnosis not present

## 2017-07-13 DIAGNOSIS — O26893 Other specified pregnancy related conditions, third trimester: Secondary | ICD-10-CM | POA: Insufficient documentation

## 2017-07-13 DIAGNOSIS — Z3A38 38 weeks gestation of pregnancy: Secondary | ICD-10-CM

## 2017-07-13 LAB — URINALYSIS, ROUTINE W REFLEX MICROSCOPIC
Bilirubin Urine: NEGATIVE
GLUCOSE, UA: NEGATIVE mg/dL
HGB URINE DIPSTICK: NEGATIVE
Ketones, ur: NEGATIVE mg/dL
Leukocytes, UA: NEGATIVE
Nitrite: NEGATIVE
PROTEIN: NEGATIVE mg/dL
Specific Gravity, Urine: 1.019 (ref 1.005–1.030)
pH: 5 (ref 5.0–8.0)

## 2017-07-13 LAB — MAGNESIUM: MAGNESIUM: 1.6 mg/dL — AB (ref 1.7–2.4)

## 2017-07-13 LAB — COMPREHENSIVE METABOLIC PANEL
ALBUMIN: 2.8 g/dL — AB (ref 3.5–5.0)
ALT: 24 U/L (ref 14–54)
ANION GAP: 10 (ref 5–15)
AST: 24 U/L (ref 15–41)
Alkaline Phosphatase: 98 U/L (ref 38–126)
BILIRUBIN TOTAL: 0.2 mg/dL — AB (ref 0.3–1.2)
BUN: 14 mg/dL (ref 6–20)
CALCIUM: 8.6 mg/dL — AB (ref 8.9–10.3)
CO2: 19 mmol/L — ABNORMAL LOW (ref 22–32)
Chloride: 107 mmol/L (ref 101–111)
Creatinine, Ser: 0.53 mg/dL (ref 0.44–1.00)
Glucose, Bld: 105 mg/dL — ABNORMAL HIGH (ref 65–99)
Potassium: 3.5 mmol/L (ref 3.5–5.1)
Sodium: 136 mmol/L (ref 135–145)
TOTAL PROTEIN: 6.2 g/dL — AB (ref 6.5–8.1)

## 2017-07-13 LAB — CBC WITH DIFFERENTIAL/PLATELET
BASOS ABS: 0 10*3/uL (ref 0.0–0.1)
Basophils Relative: 0 %
EOS PCT: 0 %
Eosinophils Absolute: 0.1 10*3/uL (ref 0.0–0.7)
HCT: 37 % (ref 36.0–46.0)
Hemoglobin: 12.6 g/dL (ref 12.0–15.0)
LYMPHS PCT: 10 %
Lymphs Abs: 1.1 10*3/uL (ref 0.7–4.0)
MCH: 30.1 pg (ref 26.0–34.0)
MCHC: 34.1 g/dL (ref 30.0–36.0)
MCV: 88.3 fL (ref 78.0–100.0)
MONO ABS: 0.6 10*3/uL (ref 0.1–1.0)
MONOS PCT: 5 %
Neutro Abs: 10.1 10*3/uL — ABNORMAL HIGH (ref 1.7–7.7)
Neutrophils Relative %: 85 %
Platelets: 127 10*3/uL — ABNORMAL LOW (ref 150–400)
RBC: 4.19 MIL/uL (ref 3.87–5.11)
RDW: 14.4 % (ref 11.5–15.5)
WBC: 11.8 10*3/uL — ABNORMAL HIGH (ref 4.0–10.5)

## 2017-07-13 LAB — TROPONIN I: Troponin I: <0.03 ng/mL (ref <0.03)

## 2017-07-13 NOTE — Progress Notes (Addendum)
G1 @ 37.[redacted] wksga. Here dt not feeling "normal". Had been at the pool for an hour. States been coughing some. States when taking deep breath chest hurts 4/10 pain.   Lab at bs.   1845: EKG done  Provider at bs assessing.   2030 EKG redo ordered.   EKG redo done. However, Provider wants the demographic pt info part of ekg prinited.  2034: Respiratory notified to redo EKG   2112: d/c instructions given with pt understanding. Pt left unit via ambulatory.

## 2017-07-13 NOTE — MAU Provider Note (Addendum)
Chief Complaint:  No chief complaint on file.   First Provider Initiated Contact with Patient 07/13/17 1906     HPI: Emily Levine is a 22 y.o. G1P0 at [redacted]w[redacted]d who presents to maternity admissions reporting SOB and chest pressure since yesterday and feeling as if she was going to pass out.  Location: substernal Quality: pressure Severity: Moderate Duration: <24 hours Context: third trimester pregnancy. Spent an hour out in the sun this afternoon.  Timing: constant Modifying factors: None. Hasn't tried anything for the Sx.  Associated signs and symptoms: Negative for fever, chills, hemoptysis.  Does report feeling hot last night and having a mild cough.   Denies contractions, leakage of fluid or vaginal bleeding. Good fetal movement.  No recent travel.   Pregnancy Course:   Past Medical History:  Diagnosis Date  . Medical history non-contributory    OB History  Gravida Para Term Preterm AB Living  1            SAB TAB Ectopic Multiple Live Births               # Outcome Date GA Lbr Len/2nd Weight Sex Delivery Anes PTL Lv  1 Current            Past Surgical History:  Procedure Laterality Date  . HERNIA MESH REMOVAL  2000  . HERNIA REPAIR    . TONSILLECTOMY     Family History  Problem Relation Age of Onset  . Cancer Maternal Grandmother    Social History   Tobacco Use  . Smoking status: Never Smoker  . Smokeless tobacco: Never Used  Substance Use Topics  . Alcohol use: No  . Drug use: No   No Known Allergies Medications Prior to Admission  Medication Sig Dispense Refill Last Dose  . calcium carbonate (TUMS - DOSED IN MG ELEMENTAL CALCIUM) 500 MG chewable tablet Chew 1 tablet by mouth as needed for indigestion or heartburn.   Taking  . Calcium Carbonate-Vitamin D (CALCIUM 600+D) 600-200 MG-UNIT TABS Take 1 tablet by mouth 2 (two) times daily.   Taking  . FOLIC ACID PO Take 1 tablet by mouth daily.   Taking  . Prenatal Vit-Fe Fumarate-FA (MULTIVITAMIN-PRENATAL)  27-0.8 MG TABS tablet Take 1 tablet by mouth daily at 12 noon. 30 each 9 Taking  . promethazine (PHENERGAN) 25 MG tablet Take 0.5-1 tablets (12.5-25 mg total) by mouth every 6 (six) hours as needed for nausea or vomiting. (Patient not taking: Reported on 06/23/2017) 30 tablet 0 Not Taking    I have reviewed patient's Past Medical Hx, Surgical Hx, Family Hx, Social Hx, medications and allergies.   ROS:  Review of Systems  Constitutional: Negative for chills, diaphoresis and fever.  HENT: Negative for congestion.   Respiratory: Positive for cough and shortness of breath. Negative for chest tightness and wheezing.   Cardiovascular: Negative for palpitations and leg swelling.  Gastrointestinal: Negative for abdominal pain, nausea and vomiting.  Genitourinary: Negative for vaginal bleeding.  Neurological: Positive for weakness and light-headedness. Negative for syncope and headaches.    Physical Exam   Patient Vitals for the past 24 hrs:  BP Temp Temp src Pulse Resp SpO2 Weight  07/13/17 1759 (!) 95/51 98.4 F (36.9 C) Oral (!) 103 19 100 % 192 lb (87.1 kg)  07/13/17 1757 - - - - - 100 % -  Pulse 80-100s per continuous pulse ox.  Constitutional: Well-developed, well-nourished female in no acute distress.  Skin: No pallor Cardiovascular: normal rate and  rhythm.  No murmurs rubs or gallops Respiratory: normal effort.  Letter to auscultation bilaterally GI: Abd soft, non-tender, gravid appropriate for gestational age.  MS: Extremities nontender, no edema, normal ROM.  Negative Homans sign.  Calves nontender, no warmth or erythema Neurologic: Alert and oriented x 4.Neg CVAT. Pelvic: Deferred  FHT:  Baseline 140 , moderate variability, accelerations present, no decelerations Contractions: rare, mild   Labs: Results for orders placed or performed during the hospital encounter of 07/13/17 (from the past 24 hour(s))  Urinalysis, Routine w reflex microscopic     Status: None   Collection  Time: 07/13/17  6:00 PM  Result Value Ref Range   Color, Urine YELLOW YELLOW   APPearance CLEAR CLEAR   Specific Gravity, Urine 1.019 1.005 - 1.030   pH 5.0 5.0 - 8.0   Glucose, UA NEGATIVE NEGATIVE mg/dL   Hgb urine dipstick NEGATIVE NEGATIVE   Bilirubin Urine NEGATIVE NEGATIVE   Ketones, ur NEGATIVE NEGATIVE mg/dL   Protein, ur NEGATIVE NEGATIVE mg/dL   Nitrite NEGATIVE NEGATIVE   Leukocytes, UA NEGATIVE NEGATIVE  CBC with Differential/Platelet     Status: Abnormal   Collection Time: 07/13/17  6:26 PM  Result Value Ref Range   WBC 11.8 (H) 4.0 - 10.5 K/uL   RBC 4.19 3.87 - 5.11 MIL/uL   Hemoglobin 12.6 12.0 - 15.0 g/dL   HCT 78.2 95.6 - 21.3 %   MCV 88.3 78.0 - 100.0 fL   MCH 30.1 26.0 - 34.0 pg   MCHC 34.1 30.0 - 36.0 g/dL   RDW 08.6 57.8 - 46.9 %   Platelets 127 (L) 150 - 400 K/uL   Neutrophils Relative % 85 %   Neutro Abs 10.1 (H) 1.7 - 7.7 K/uL   Lymphocytes Relative 10 %   Lymphs Abs 1.1 0.7 - 4.0 K/uL   Monocytes Relative 5 %   Monocytes Absolute 0.6 0.1 - 1.0 K/uL   Eosinophils Relative 0 %   Eosinophils Absolute 0.1 0.0 - 0.7 K/uL   Basophils Relative 0 %   Basophils Absolute 0.0 0.0 - 0.1 K/uL  Troponin I     Status: None   Collection Time: 07/13/17  6:26 PM  Result Value Ref Range   Troponin I <0.03 <0.03 ng/mL  Comprehensive metabolic panel     Status: Abnormal   Collection Time: 07/13/17  6:26 PM  Result Value Ref Range   Sodium 136 135 - 145 mmol/L   Potassium 3.5 3.5 - 5.1 mmol/L   Chloride 107 101 - 111 mmol/L   CO2 19 (L) 22 - 32 mmol/L   Glucose, Bld 105 (H) 65 - 99 mg/dL   BUN 14 6 - 20 mg/dL   Creatinine, Ser 6.29 0.44 - 1.00 mg/dL   Calcium 8.6 (L) 8.9 - 10.3 mg/dL   Total Protein 6.2 (L) 6.5 - 8.1 g/dL   Albumin 2.8 (L) 3.5 - 5.0 g/dL   AST 24 15 - 41 U/L   ALT 24 14 - 54 U/L   Alkaline Phosphatase 98 38 - 126 U/L   Total Bilirubin 0.2 (L) 0.3 - 1.2 mg/dL   GFR calc non Af Amer >60 >60 mL/min   GFR calc Af Amer >60 >60 mL/min   Anion  gap 10 5 - 15  Magnesium     Status: Abnormal   Collection Time: 07/13/17  6:26 PM  Result Value Ref Range   Magnesium 1.6 (L) 1.7 - 2.4 mg/dL    Imaging:  No results found.  MAU Course: Orders Placed This Encounter  Procedures  . Urinalysis, Routine w reflex microscopic  . CBC with Differential/Platelet  . Troponin I  . Comprehensive metabolic panel  . Magnesium  . Orthostatic vital signs  . EKG 12-Lead   No orders of the defined types were placed in this encounter.  Discussed Hx, labs, exam w/ Dr. Alfonzo Feller, cardiologist.  Reviewed labs, EKG--no concerning findings, but recommends having EKG repeated due to poor lead placement.  Will push p.o. Fluids due to borderline dehydration.  Care of patient turned over to Iven Finn, CNM at 8:10 PM.  Katrinka Blazing, [place], CNM 07/13/2017 8:13 PM  Repeat EKG shows normal sinus rhythm and normal EKG. Consulted with cardiology- ok to discharge if patient asymptomatic. Patient in no distress and tolerating PO fluid. Reports feeling better.   1. Dyspnea on exertion   2. [redacted] weeks gestation of pregnancy    -Discharge home in stable condition -Dehydration precautions discussed. Reviewed URI in pregnancy and OTC options. -Patient advised to follow-up with [facility] as scheduled for prenatal care -Patient may return to MAU as needed or if her condition were to change or worsen  Rolm Bookbinder, [place] 07/13/17 8:47 PM

## 2017-07-13 NOTE — MAU Note (Addendum)
Feels like she is going to pass out, feels hot, hard to breath.  Started yesterday couldn't sleep last night.  No bleeding, ? Clear water d/c.   No abd pain or contractions, hurts when she expands her lungs all the way when she breaths.  Heaviness in her chest, feels lightheaded

## 2017-07-13 NOTE — Discharge Instructions (Signed)

## 2017-07-14 ENCOUNTER — Other Ambulatory Visit (HOSPITAL_COMMUNITY)
Admission: RE | Admit: 2017-07-14 | Discharge: 2017-07-14 | Disposition: A | Discharge status: Home / Self Care | Payer: Medicaid Other | Source: Ambulatory Visit | Attending: Advanced Practice Midwife | Admitting: Advanced Practice Midwife

## 2017-07-14 ENCOUNTER — Encounter: Payer: Self-pay | Admitting: Advanced Practice Midwife

## 2017-07-14 ENCOUNTER — Ambulatory Visit (INDEPENDENT_AMBULATORY_CARE_PROVIDER_SITE_OTHER): Payer: Medicaid Other | Admitting: Advanced Practice Midwife

## 2017-07-14 VITALS — BP 98/59 | HR 81 | Wt 191.0 lb

## 2017-07-14 DIAGNOSIS — Z3400 Encounter for supervision of normal first pregnancy, unspecified trimester: Secondary | ICD-10-CM

## 2017-07-14 DIAGNOSIS — Z3403 Encounter for supervision of normal first pregnancy, third trimester: Secondary | ICD-10-CM | POA: Diagnosis present

## 2017-07-14 LAB — OB RESULTS CONSOLE GC/CHLAMYDIA: Gonorrhea: NEGATIVE

## 2017-07-14 LAB — OB RESULTS CONSOLE GBS: GBS: NEGATIVE

## 2017-07-14 NOTE — Patient Instructions (Addendum)
Vaginal delivery means that you will give birth by pushing your baby out of your birth canal (vagina). A team of health care providers will help you before, during, and after vaginal delivery. Birth experiences are unique for every woman and every pregnancy, and birth experiences vary depending on where you choose to give birth. What should I do to prepare for my baby's birth? Before your baby is born, it is important to talk with your health care provider about:  Your labor and delivery preferences. These may include: ? Medicines that you may be given. ? How you will manage your pain. This might include non-medical pain relief techniques or injectable pain relief such as epidural analgesia. ? How you and your baby will be monitored during labor and delivery. ? Who may be in the labor and delivery room with you. ? Your feelings about surgical delivery of your baby (cesarean delivery, or C-section) if this becomes necessary. ? Your feelings about receiving donated blood through an IV tube (blood transfusion) if this becomes necessary.  Whether you are able: ? To take pictures or videos of the birth. ? To eat during labor and delivery. ? To move around, walk, or change positions during labor and delivery.  What to expect after your baby is born, such as: ? Whether delayed umbilical cord clamping and cutting is offered. ? Who will care for your baby right after birth. ? Medicines or tests that may be recommended for your baby. ? Whether breastfeeding is supported in your hospital or birth center. ? How long you will be in the hospital or birth center.  How any medical conditions you have may affect your baby or your labor and delivery experience.  To prepare for your baby's birth, you should also:  Attend all of your health care visits before delivery (prenatal visits) as recommended by your health care provider. This is important.  Prepare your home for your baby's arrival. Make sure  that you have: ? Diapers. ? Baby clothing. ? Feeding equipment. ? Safe sleeping arrangements for you and your baby.  Install a car seat in your vehicle. Have your car seat checked by a certified car seat installer to make sure that it is installed safely.  Think about who will help you with your new baby at home for at least the first several weeks after delivery.  What can I expect when I arrive at the birth center or hospital? Once you are in labor and have been admitted into the hospital or birth center, your health care provider may:  Review your pregnancy history and any concerns you have.  Insert an IV tube into one of your veins. This is used to give you fluids and medicines.  Check your blood pressure, pulse, temperature, and heart rate (vital signs).  Check whether your bag of water (amniotic sac) has broken (ruptured).  Talk with you about your birth plan and discuss pain control options.  Monitoring Your health care provider may monitor your contractions (uterine monitoring) and your baby's heart rate (fetal monitoring). You may need to be monitored:  Often, but not continuously (intermittently).  All the time or for long periods at a time (continuously). Continuous monitoring may be needed if: ? You are taking certain medicines, such as medicine to relieve pain or make your contractions stronger. ? You have pregnancy or labor complications.  Monitoring may be done by:  Placing a special stethoscope or a handheld monitoring device on your abdomen to check your   baby's heartbeat, and feeling your abdomen for contractions. This method of monitoring does not continuously record your baby's heartbeat or your contractions.  Placing monitors on your abdomen (external monitors) to record your baby's heartbeat and the frequency and length of contractions. You may not have to wear external monitors all the time.  Placing monitors inside of your uterus (internal monitors) to  record your baby's heartbeat and the frequency, length, and strength of your contractions. ? Your health care provider may use internal monitors if he or she needs more information about the strength of your contractions or your baby's heart rate. ? Internal monitors are put in place by passing a thin, flexible wire through your vagina and into your uterus. Depending on the type of monitor, it may remain in your uterus or on your baby's head until birth. ? Your health care provider will discuss the benefits and risks of internal monitoring with you and will ask for your permission before inserting the monitors.  Telemetry. This is a type of continuous monitoring that can be done with external or internal monitors. Instead of having to stay in bed, you are able to move around during telemetry. Ask your health care provider if telemetry is an option for you.  Physical exam Your health care provider may perform a physical exam. This may include:  Checking whether your baby is positioned: ? With the head toward your vagina (head-down). This is most common. ? With the head toward the top of your uterus (head-up or breech). If your baby is in a breech position, your health care provider may try to turn your baby to a head-down position so you can deliver vaginally. If it does not seem that your baby can be born vaginally, your provider may recommend surgery to deliver your baby. In rare cases, you may be able to deliver vaginally if your baby is head-up (breech delivery). ? Lying sideways (transverse). Babies that are lying sideways cannot be delivered vaginally.  Checking your cervix to determine: ? Whether it is thinning out (effacing). ? Whether it is opening up (dilating). ? How low your baby has moved into your birth canal.  What are the three stages of labor and delivery?  Normal labor and delivery is divided into the following three stages: Stage 1  Stage 1 is the longest stage of labor,  and it can last for hours or days. Stage 1 includes: ? Early labor. This is when contractions may be irregular, or regular and mild. Generally, early labor contractions are more than 10 minutes apart. ? Active labor. This is when contractions get longer, more regular, more frequent, and more intense. ? The transition phase. This is when contractions happen very close together, are very intense, and may last longer than during any other part of labor.  Contractions generally feel mild, infrequent, and irregular at first. They get stronger, more frequent (about every 2-3 minutes), and more regular as you progress from early labor through active labor and transition.  Many women progress through stage 1 naturally, but you may need help to continue making progress. If this happens, your health care provider may talk with you about: ? Rupturing your amniotic sac if it has not ruptured yet. ? Giving you medicine to help make your contractions stronger and more frequent.  Stage 1 ends when your cervix is completely dilated to 4 inches (10 cm) and completely effaced. This happens at the end of the transition phase. Stage 2  Once your cervix   is completely effaced and dilated to 4 inches (10 cm), you may start to feel an urge to push. It is common for the body to naturally take a rest before feeling the urge to push, especially if you received an epidural or certain other pain medicines. This rest period may last for up to 1-2 hours, depending on your unique labor experience.  During stage 2, contractions are generally less painful, because pushing helps relieve contraction pain. Instead of contraction pain, you may feel stretching and burning pain, especially when the widest part of your baby's head passes through the vaginal opening (crowning).  Your health care provider will closely monitor your pushing progress and your baby's progress through the vagina during stage 2.  Your health care provider may  massage the area of skin between your vaginal opening and anus (perineum) or apply warm compresses to your perineum. This helps it stretch as the baby's head starts to crown, which can help prevent perineal tearing. ? In some cases, an incision may be made in your perineum (episiotomy) to allow the baby to pass through the vaginal opening. An episiotomy helps to make the opening of the vagina larger to allow more room for the baby to fit through.  It is very important to breathe and focus so your health care provider can control the delivery of your baby's head. Your health care provider may have you decrease the intensity of your pushing, to help prevent perineal tearing.  After delivery of your baby's head, the shoulders and the rest of the body generally deliver very quickly and without difficulty.  Once your baby is delivered, the umbilical cord may be cut right away, or this may be delayed for 1-2 minutes, depending on your baby's health. This may vary among health care providers, hospitals, and birth centers.  If you and your baby are healthy enough, your baby may be placed on your chest or abdomen to help maintain the baby's temperature and to help you bond with each other. Some mothers and babies start breastfeeding at this time. Your health care team will dry your baby and help keep your baby warm during this time.  Your baby may need immediate care if he or she: ? Showed signs of distress during labor. ? Has a medical condition. ? Was born too early (prematurely). ? Had a bowel movement before birth (meconium). ? Shows signs of difficulty transitioning from being inside the uterus to being outside of the uterus. If you are planning to breastfeed, your health care team will help you begin a feeding. Stage 3  The third stage of labor starts immediately after the birth of your baby and ends after you deliver the placenta. The placenta is an organ that develops during pregnancy to provide  oxygen and nutrients to your baby in the womb.  Delivering the placenta may require some pushing, and you may have mild contractions. Breastfeeding can stimulate contractions to help you deliver the placenta.  After the placenta is delivered, your uterus should tighten (contract) and become firm. This helps to stop bleeding in your uterus. To help your uterus contract and to control bleeding, your health care provider may: ? Give you medicine by injection, through an IV tube, by mouth, or through your rectum (rectally). ? Massage your abdomen or perform a vaginal exam to remove any blood clots that are left in your uterus. ? Empty your bladder by placing a thin, flexible tube (catheter) into your bladder. ? Encourage you to   breastfeed your baby. After labor is over, you and your baby will be monitored closely to ensure that you are both healthy until you are ready to go home. Your health care team will teach you how to care for yourself and your baby. This information is not intended to replace advice given to you by your health care provider. Make sure you discuss any questions you have with your health care provider. Document Released: 12/05/2007 Document Revised: 09/15/2015 Document Reviewed: 03/12/2015 Elsevier Interactive Patient Education  2018 [employer]  

## 2017-07-14 NOTE — Progress Notes (Signed)
   PRENATAL VISIT NOTE  Subjective:  Emily Levine is a 22 y.o. G1P0 at [redacted]w[redacted]d being seen today for ongoing prenatal care.  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  Patient reports no complaints.  Contractions: Irritability. Vag. Bleeding: None.  Movement: Present. Denies leaking of fluid.    Patient was seen in MAU yesterday. She reports that she is feeling better today   The following portions of the patient's history were reviewed and updated as appropriate: allergies, current medications, past family history, past medical history, past social history, past surgical history and problem list. Problem list updated.  Objective:   Vitals:   07/14/17 1131  BP: (!) 98/59  Pulse: 81  Weight: 191 lb (86.6 kg)    Fetal Status: Fetal Heart Rate (bpm): 137 Fundal Height: 38 cm Movement: Present     General:  Alert, oriented and cooperative. Patient is in no acute distress.  Skin: Skin is warm and dry. No rash noted.   Cardiovascular: Normal heart rate noted  Respiratory: Normal respiratory effort, no problems with respiration noted  Abdomen: Soft, gravid, appropriate for gestational age.  Pain/Pressure: Present     Pelvic: Cervical exam performed Dilation: Fingertip Effacement (%): 10 Station: Ballotable  Extremities: Normal range of motion.  Edema: Trace  Mental Status: Normal mood and affect. Normal behavior. Normal judgment and thought content.   Assessment and Plan:  Pregnancy: G1P0 at [redacted]w[redacted]d  1. Supervision of normal first pregnancy, antepartum - Routine care - GBS today    Term labor symptoms and general obstetric precautions including but not limited to vaginal bleeding, contractions, leaking of fluid and fetal movement were reviewed in detail with the patient. Please refer to  After Visit Summary for other counseling recommendations.  Return in about 1 week (around 07/21/2017).  Future Appointments  Date Time Provider Department Center  07/21/2017 11:15 AM Sharyon Cable, CNM [facility]  07/28/2017 11:15 AM Armando Reichert, CNM [facility]    Thressa Sheller, CNM

## 2017-07-15 LAB — GC/CHLAMYDIA PROBE AMP (~~LOC~~) NOT AT ARMC
Chlamydia: NEGATIVE
Neisseria Gonorrhea: NEGATIVE

## 2017-07-18 LAB — CULTURE, BETA STREP (GROUP B ONLY): STREP GP B CULTURE: NEGATIVE

## 2017-07-21 ENCOUNTER — Encounter: Payer: Self-pay | Admitting: Certified Nurse Midwife

## 2017-07-28 ENCOUNTER — Encounter: Payer: Self-pay | Admitting: Advanced Practice Midwife

## 2017-07-28 ENCOUNTER — Ambulatory Visit (INDEPENDENT_AMBULATORY_CARE_PROVIDER_SITE_OTHER): Payer: Medicaid Other | Admitting: Advanced Practice Midwife

## 2017-07-28 ENCOUNTER — Telehealth (HOSPITAL_COMMUNITY): Payer: Self-pay | Admitting: *Deleted

## 2017-07-28 ENCOUNTER — Encounter (HOSPITAL_COMMUNITY): Payer: Self-pay | Admitting: *Deleted

## 2017-07-28 VITALS — BP 107/60 | HR 81

## 2017-07-28 DIAGNOSIS — F329 Major depressive disorder, single episode, unspecified: Secondary | ICD-10-CM

## 2017-07-28 DIAGNOSIS — Z3402 Encounter for supervision of normal first pregnancy, second trimester: Secondary | ICD-10-CM

## 2017-07-28 DIAGNOSIS — F32A Depression, unspecified: Secondary | ICD-10-CM

## 2017-07-28 DIAGNOSIS — O99342 Other mental disorders complicating pregnancy, second trimester: Secondary | ICD-10-CM

## 2017-07-28 DIAGNOSIS — Z3400 Encounter for supervision of normal first pregnancy, unspecified trimester: Secondary | ICD-10-CM

## 2017-07-28 DIAGNOSIS — R87612 Low grade squamous intraepithelial lesion on cytologic smear of cervix (LGSIL): Secondary | ICD-10-CM

## 2017-07-28 NOTE — Telephone Encounter (Signed)
Preadmission screen  

## 2017-07-28 NOTE — Progress Notes (Signed)
   PRENATAL VISIT NOTE  Subjective:  Emily Levine is a 22 y.o. G1P0 at [redacted]w[redacted]d being seen today for ongoing prenatal care.  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  Patient reports no complaints.  Contractions: Irregular. Vag. Bleeding: None.  Movement: Present. Denies leaking of fluid.   The following portions of the patient's history were reviewed and updated as appropriate: allergies, current medications, past family history, past medical history, past social history, past surgical history and problem list. Problem list updated.  Objective:   Vitals:   07/28/17 1137  BP: 107/60  Pulse: 81    Fetal Status: Fetal Heart Rate (bpm): 127 Fundal Height: 40 cm Movement: Present     General:  Alert, oriented and cooperative. Patient is in no acute distress.  Skin: Skin is warm and dry. No rash noted.   Cardiovascular: Normal heart rate noted  Respiratory: Normal respiratory effort, no problems with respiration noted  Abdomen: Soft, gravid, appropriate for gestational age.  Pain/Pressure: Present     Pelvic: Cervical exam performed Dilation: Fingertip Effacement (%): 10 Station: Ballotable  Extremities: Normal range of motion.  Edema: Trace  Mental Status: Normal mood and affect. Normal behavior. Normal judgment and thought content.   Assessment and Plan:  Pregnancy: G1P0 at [redacted]w[redacted]d  1. Supervision of normal pregnancy  Term labor symptoms and general obstetric precautions including but not limited to vaginal bleeding, contractions, leaking of fluid and fetal movement were reviewed in detail with the patient.  - IOL scheduled for 5/26 (no inductions being scheduled on 5/27 due to holiday). - Patient will have BPP/NST later this week - FB placement planned for MAU on 08/02/17  Please  refer to After Visit Summary for other counseling recommendations.  Return in about 1 week (around 08/04/2017).  Future Appointments  Date Time Provider Department Center  08/03/2017  7:30 AM [facility] RO[facility] None    Thressa Sheller, CNM

## 2017-07-28 NOTE — Patient Instructions (Addendum)
Please come to MAU Saturday 5/25 for a foley balloon placement. You will then be sent home. Plan to return to admission to labor and delivery Sunday 08/03/17 at 7:15am    OUTPATIENT FOLEY BULB INDUCTION OF LABOR:  Information Sheet for Mothers and Family               What's a Foley Bulb Induction? A Foley bulb induction is a procedure where your provider inserts a catheter into your cervix. Once inside your womb, your provider inflates the balloon with a saline solution.   This puts pressure on your cervix and encourages dilation. The catheter falls out once your cervix dilates to 3-4 centimeters.     With any procedure, it's important that you know what to expect. The insertion of a Foley catheter can be a bit uncomfortable, and some women experience sharp pelvic pain. The pain may subside once the catheter is in place. You may experience some cramping when the Foley catheter is in place.  This is normal.     GO TO THE MATERNITY ADMISSIONS UNIT FOR THE FOLLOWING:  Heavy vaginal bleeding  Rupture of membranes (fluid that wets your underwear)  Painful uterine contractions every 5 minutes or less  Severe abdominal discomfort  Decreased movement of the baby

## 2017-07-29 ENCOUNTER — Encounter: Payer: Self-pay | Admitting: Advanced Practice Midwife

## 2017-07-31 ENCOUNTER — Ambulatory Visit (INDEPENDENT_AMBULATORY_CARE_PROVIDER_SITE_OTHER): Payer: Medicaid Other | Admitting: *Deleted

## 2017-07-31 ENCOUNTER — Ambulatory Visit: Payer: Self-pay

## 2017-07-31 VITALS — BP 103/64 | HR 79 | Wt 192.0 lb

## 2017-07-31 DIAGNOSIS — O480 Post-term pregnancy: Secondary | ICD-10-CM | POA: Diagnosis not present

## 2017-07-31 NOTE — Progress Notes (Signed)
Pt informed that the ultrasound is considered a limited OB ultrasound and is not intended to be a complete ultrasound exam.  Patient also informed that the ultrasound is not being completed with the intent of assessing for fetal or placental anomalies or any pelvic abnormalities.  Explained that the purpose of today's ultrasound is to assess for presentation, BPP and amniotic fluid volume.  Patient acknowledges the purpose of the exam and the limitations of the study.    IOL scheduled 5/26 @ 0730

## 2017-08-02 ENCOUNTER — Encounter (HOSPITAL_COMMUNITY): Payer: Self-pay | Admitting: *Deleted

## 2017-08-02 ENCOUNTER — Inpatient Hospital Stay (HOSPITAL_COMMUNITY)
Admission: AD | Admit: 2017-08-02 | Discharge: 2017-08-02 | Disposition: A | Discharge status: Home / Self Care | Payer: Medicaid Other | Source: Ambulatory Visit | Attending: Obstetrics and Gynecology | Admitting: Obstetrics and Gynecology

## 2017-08-02 DIAGNOSIS — Z3400 Encounter for supervision of normal first pregnancy, unspecified trimester: Secondary | ICD-10-CM

## 2017-08-02 DIAGNOSIS — O480 Post-term pregnancy: Secondary | ICD-10-CM

## 2017-08-02 NOTE — MAU Note (Signed)
Pt here for foley bulb placement

## 2017-08-02 NOTE — MAU Provider Note (Signed)
   PRENATAL VISIT NOTE  Subjective:  Emily Levine is a 22 y.o. G1P0 at [redacted]w[redacted]d being seen today for ongoing prenatal care.  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  Patient reports no complaints.   .  .   . Denies leaking of fluid.   The following portions of the patient's history were reviewed and updated as appropriate: allergies, current medications, past family history, past medical history, past social history, past surgical history and problem list. Problem list updated.  Objective:  There were no vitals filed for this visit.  Fetal Status:           General:  Alert, oriented and cooperative. Patient is in no acute distress.  Skin: Skin is warm and dry. No rash noted.   Cardiovascular: Normal heart rate noted  Respiratory: Normal respiratory effort, no problems with respiration noted  Abdomen: Soft, gravid, appropriate for gestational age.        Pelvic: Cervical exam performed        Extremities: Normal range of motion.     Mental Status:  Normal mood and affect. Normal behavior. Normal judgment and thought content.  Procedure: Patient informed of R/B/A of procedure. NST was performed and was reactive prior to procedure. Procedure done to begin ripening of the cervix prior to admission for induction of labor. Appropriate time out taken. The patient was placed in the lithotomy position and the cervix brought into view with sterile speculum. A ring forcep was used to guide the 44F foley balloon through the internal os of the cervix. Foley Balloon filled with 60cc of sterile water. Plug inserted into end of the foley. Foley placed on tension and taped to medical thigh. NST:  Baseline: 125-130 bpm and Accelerations: Reactive there were no signs of tachysystole or hypertonus. All equipment  was removed and accounted for. The patient tolerated the procedure well.  Assessment and Plan:  Pregnancy: G1P0 at [redacted]w[redacted]d   S/p Outpatient placement of foley balloon catheter for cervical ripening. Induction of labor scheduled for tomorrow at0730. Reassuring FHR tracing with no concerns at present. Warning signs given to patient to include return to MAU for heavy vaginal bleeding, Rupture of membranes, painful uterine contractions q 5 mins or less, severe abdominal discomfort, decreased fetal movement.  No follow-ups on file.   Wyvonnia Dusky, CNM 08/02/2017 4:04 PM

## 2017-08-02 NOTE — Discharge Instructions (Signed)

## 2017-08-03 ENCOUNTER — Inpatient Hospital Stay (HOSPITAL_COMMUNITY): Payer: Medicaid Other | Admitting: Anesthesiology

## 2017-08-03 ENCOUNTER — Encounter (HOSPITAL_COMMUNITY): Payer: Self-pay

## 2017-08-03 ENCOUNTER — Other Ambulatory Visit: Payer: Self-pay

## 2017-08-03 ENCOUNTER — Inpatient Hospital Stay (HOSPITAL_COMMUNITY)
Admission: RE | Admit: 2017-08-03 | Discharge: 2017-08-05 | DRG: 806 | Disposition: A | Discharge status: Home / Self Care | Payer: Medicaid Other | Source: Ambulatory Visit | Attending: Obstetrics & Gynecology | Admitting: Obstetrics & Gynecology

## 2017-08-03 VITALS — BP 94/54 | HR 61 | Temp 98.1°F | Resp 20 | Ht 64.0 in | Wt 201.0 lb

## 2017-08-03 DIAGNOSIS — O9912 Other diseases of the blood and blood-forming organs and certain disorders involving the immune mechanism complicating childbirth: Secondary | ICD-10-CM | POA: Diagnosis present

## 2017-08-03 DIAGNOSIS — D696 Thrombocytopenia, unspecified: Secondary | ICD-10-CM | POA: Diagnosis present

## 2017-08-03 DIAGNOSIS — Z3400 Encounter for supervision of normal first pregnancy, unspecified trimester: Secondary | ICD-10-CM

## 2017-08-03 DIAGNOSIS — Z3A40 40 weeks gestation of pregnancy: Secondary | ICD-10-CM

## 2017-08-03 DIAGNOSIS — O480 Post-term pregnancy: Principal | ICD-10-CM | POA: Diagnosis present

## 2017-08-03 DIAGNOSIS — Z8759 Personal history of other complications of pregnancy, childbirth and the puerperium: Secondary | ICD-10-CM

## 2017-08-03 LAB — CBC
HCT: 40.6 % (ref 36.0–46.0)
HEMATOCRIT: 37.4 % (ref 36.0–46.0)
HEMOGLOBIN: 13.9 g/dL (ref 12.0–15.0)
HEMOGLOBIN: 12.5 g/dL (ref 12.0–15.0)
MCH: 30.6 pg (ref 26.0–34.0)
MCH: 30.3 pg (ref 26.0–34.0)
MCHC: 34.2 g/dL (ref 30.0–36.0)
MCHC: 33.4 g/dL (ref 30.0–36.0)
MCV: 89.4 fL (ref 78.0–100.0)
MCV: 90.6 fL (ref 78.0–100.0)
Platelets: 129 10*3/uL — ABNORMAL LOW (ref 150–400)
Platelets: 110 10*3/uL — ABNORMAL LOW (ref 150–400)
RBC: 4.54 MIL/uL (ref 3.87–5.11)
RBC: 4.13 MIL/uL (ref 3.87–5.11)
RDW: 14.8 % (ref 11.5–15.5)
RDW: 14.9 % (ref 11.5–15.5)
WBC: 18 10*3/uL — AB (ref 4.0–10.5)
WBC: 16.3 10*3/uL — ABNORMAL HIGH (ref 4.0–10.5)

## 2017-08-03 LAB — TYPE AND SCREEN
ABO/RH(D): A POS
ANTIBODY SCREEN: NEGATIVE

## 2017-08-03 LAB — RPR: RPR: NONREACTIVE

## 2017-08-03 MED ORDER — PHENYLEPHRINE 40 MCG/ML (10ML) SYRINGE FOR IV PUSH (FOR BLOOD PRESSURE SUPPORT)
80.0000 ug | PREFILLED_SYRINGE | INTRAVENOUS | Status: DC | PRN
  Filled 2017-08-03: qty 5

## 2017-08-03 MED ORDER — SENNOSIDES-DOCUSATE SODIUM 8.6-50 MG PO TABS
2.0000 | ORAL_TABLET | ORAL | Status: DC
  Administered 2017-08-03 – 2017-08-04 (×2): 2 via ORAL
  Filled 2017-08-03 (×3): qty 2

## 2017-08-03 MED ORDER — SODIUM CHLORIDE 0.9% FLUSH: 3.0000 mL | INTRAVENOUS | Status: DC | PRN

## 2017-08-03 MED ORDER — ACETAMINOPHEN 325 MG PO TABS
650.0000 mg | ORAL_TABLET | ORAL | Status: DC | PRN
  Administered 2017-08-03 – 2017-08-04 (×3): 650 mg via ORAL
  Filled 2017-08-03 (×3): qty 2

## 2017-08-03 MED ORDER — EPHEDRINE 5 MG/ML INJ
10.0000 mg | INTRAVENOUS | Status: DC | PRN
  Filled 2017-08-03: qty 2

## 2017-08-03 MED ORDER — DIPHENHYDRAMINE HCL 25 MG PO CAPS: 25.0000 mg | ORAL_CAPSULE | Freq: Four times a day (QID) | ORAL | Status: DC | PRN

## 2017-08-03 MED ORDER — LACTATED RINGERS IV SOLN
500.0000 mL | Freq: Once | INTRAVENOUS | Status: AC
  Administered 2017-08-03: 500 mL via INTRAVENOUS

## 2017-08-03 MED ORDER — LACTATED RINGERS IV SOLN
500.0000 mL | INTRAVENOUS | Status: DC | PRN
  Administered 2017-08-03: 500 mL via INTRAVENOUS
  Administered 2017-08-03: 1000 mL via INTRAVENOUS

## 2017-08-03 MED ORDER — ONDANSETRON HCL 4 MG/2ML IJ SOLN
4.0000 mg | Freq: Four times a day (QID) | INTRAMUSCULAR | Status: DC | PRN
  Administered 2017-08-03 (×2): 4 mg via INTRAVENOUS
  Filled 2017-08-03 (×2): qty 2

## 2017-08-03 MED ORDER — OXYTOCIN BOLUS FROM INFUSION
500.0000 mL | Freq: Once | INTRAVENOUS | Status: AC
  Administered 2017-08-03: 500 mL via INTRAVENOUS

## 2017-08-03 MED ORDER — ONDANSETRON HCL 4 MG/2ML IJ SOLN: 4.0000 mg | INTRAMUSCULAR | Status: DC | PRN

## 2017-08-03 MED ORDER — ACETAMINOPHEN 325 MG PO TABS: 650.0000 mg | ORAL_TABLET | ORAL | Status: DC | PRN

## 2017-08-03 MED ORDER — IBUPROFEN 600 MG PO TABS
600.0000 mg | ORAL_TABLET | Freq: Four times a day (QID) | ORAL | Status: DC
  Administered 2017-08-03 – 2017-08-05 (×7): 600 mg via ORAL
  Filled 2017-08-03 (×7): qty 1

## 2017-08-03 MED ORDER — SOD CITRATE-CITRIC ACID 500-334 MG/5ML PO SOLN: 30.0000 mL | ORAL | Status: DC | PRN

## 2017-08-03 MED ORDER — DIPHENHYDRAMINE HCL 50 MG/ML IJ SOLN: 12.5000 mg | INTRAMUSCULAR | Status: DC | PRN

## 2017-08-03 MED ORDER — PHENYLEPHRINE 40 MCG/ML (10ML) SYRINGE FOR IV PUSH (FOR BLOOD PRESSURE SUPPORT)
80.0000 ug | PREFILLED_SYRINGE | INTRAVENOUS | Status: DC | PRN
  Filled 2017-08-03: qty 10
  Filled 2017-08-03: qty 5

## 2017-08-03 MED ORDER — MEASLES, MUMPS & RUBELLA VAC ~~LOC~~ INJ: 0.5000 mL | INJECTION | Freq: Once | SUBCUTANEOUS | Status: DC

## 2017-08-03 MED ORDER — OXYCODONE-ACETAMINOPHEN 5-325 MG PO TABS: 2.0000 | ORAL_TABLET | ORAL | Status: DC | PRN

## 2017-08-03 MED ORDER — SODIUM CHLORIDE 0.9 % IV SOLN: 250.0000 mL | INTRAVENOUS | Status: DC | PRN

## 2017-08-03 MED ORDER — FENTANYL 2.5 MCG/ML BUPIVACAINE 1/10 % EPIDURAL INFUSION (WH - ANES)
14.0000 mL/h | INTRAMUSCULAR | Status: DC | PRN
  Administered 2017-08-03: 14 mL/h via EPIDURAL
  Filled 2017-08-03: qty 100

## 2017-08-03 MED ORDER — OXYCODONE-ACETAMINOPHEN 5-325 MG PO TABS: 1.0000 | ORAL_TABLET | ORAL | Status: DC | PRN

## 2017-08-03 MED ORDER — BENZOCAINE-MENTHOL 20-0.5 % EX AERO
1.0000 "application " | INHALATION_SPRAY | CUTANEOUS | Status: DC | PRN
  Administered 2017-08-03: 1 via TOPICAL
  Filled 2017-08-03: qty 56

## 2017-08-03 MED ORDER — OXYTOCIN 40 UNITS IN LACTATED RINGERS INFUSION - SIMPLE MED
2.5000 [IU]/h | INTRAVENOUS | Status: DC
  Administered 2017-08-03: 2.5 [IU]/h via INTRAVENOUS

## 2017-08-03 MED ORDER — DIBUCAINE 1 % RE OINT: 1.0000 "application " | TOPICAL_OINTMENT | RECTAL | Status: DC | PRN

## 2017-08-03 MED ORDER — OXYTOCIN 40 UNITS IN LACTATED RINGERS INFUSION - SIMPLE MED
1.0000 m[IU]/min | INTRAVENOUS | Status: DC
  Filled 2017-08-03: qty 1000

## 2017-08-03 MED ORDER — PRENATAL MULTIVITAMIN CH
1.0000 | ORAL_TABLET | Freq: Every day | ORAL | Status: DC
  Administered 2017-08-05: 1 via ORAL
  Filled 2017-08-03 (×2): qty 1

## 2017-08-03 MED ORDER — COCONUT OIL OIL: 1.0000 "application " | TOPICAL_OIL | Status: DC | PRN

## 2017-08-03 MED ORDER — TERBUTALINE SULFATE 1 MG/ML IJ SOLN
0.2500 mg | Freq: Once | INTRAMUSCULAR | Status: DC | PRN
  Filled 2017-08-03: qty 1

## 2017-08-03 MED ORDER — FENTANYL CITRATE (PF) 100 MCG/2ML IJ SOLN
50.0000 ug | INTRAMUSCULAR | Status: DC | PRN
  Administered 2017-08-03 (×2): 100 ug via INTRAVENOUS
  Filled 2017-08-03 (×3): qty 2

## 2017-08-03 MED ORDER — LACTATED RINGERS IV SOLN
INTRAVENOUS | Status: DC
  Administered 2017-08-03 (×2): via INTRAVENOUS

## 2017-08-03 MED ORDER — SIMETHICONE 80 MG PO CHEW: 80.0000 mg | CHEWABLE_TABLET | ORAL | Status: DC | PRN

## 2017-08-03 MED ORDER — ZOLPIDEM TARTRATE 5 MG PO TABS: 5.0000 mg | ORAL_TABLET | Freq: Every evening | ORAL | Status: DC | PRN

## 2017-08-03 MED ORDER — WITCH HAZEL-GLYCERIN EX PADS: 1.0000 "application " | MEDICATED_PAD | CUTANEOUS | Status: DC | PRN

## 2017-08-03 MED ORDER — LIDOCAINE HCL (PF) 1 % IJ SOLN
INTRAMUSCULAR | Status: DC | PRN
  Administered 2017-08-03: 7 mL via EPIDURAL
  Administered 2017-08-03: 5 mL via EPIDURAL

## 2017-08-03 MED ORDER — OXYTOCIN 40 UNITS IN LACTATED RINGERS INFUSION - SIMPLE MED
1.0000 m[IU]/min | INTRAVENOUS | Status: DC
  Administered 2017-08-03: 2 m[IU]/min via INTRAVENOUS

## 2017-08-03 MED ORDER — SODIUM CHLORIDE 0.9% FLUSH: 3.0000 mL | Freq: Two times a day (BID) | INTRAVENOUS | Status: DC

## 2017-08-03 MED ORDER — ONDANSETRON HCL 4 MG PO TABS: 4.0000 mg | ORAL_TABLET | ORAL | Status: DC | PRN

## 2017-08-03 MED ORDER — TETANUS-DIPHTH-ACELL PERTUSSIS 5-2.5-18.5 LF-MCG/0.5 IM SUSP: 0.5000 mL | Freq: Once | INTRAMUSCULAR | Status: DC

## 2017-08-03 MED ORDER — LIDOCAINE HCL (PF) 1 % IJ SOLN
30.0000 mL | INTRAMUSCULAR | Status: DC | PRN
  Filled 2017-08-03: qty 30

## 2017-08-03 NOTE — Anesthesia Preprocedure Evaluation (Signed)
Anesthesia Evaluation  Patient identified by MRN, date of birth, ID band Patient awake    Reviewed: Allergy & Precautions, H&P , NPO status , Patient's Chart, lab work & pertinent test results  Airway Mallampati: I  TM Distance: >3 FB Neck ROM: full    Dental no notable dental hx. (+) Teeth Intact   Pulmonary neg pulmonary ROS,    Pulmonary exam normal breath sounds clear to auscultation       Cardiovascular negative cardio ROS Normal cardiovascular exam Rhythm:regular Rate:Normal     Neuro/Psych negative neurological ROS     GI/Hepatic negative GI ROS, Neg liver ROS,   Endo/Other  negative endocrine ROS  Renal/GU negative Renal ROS  negative genitourinary   Musculoskeletal negative musculoskeletal ROS (+)   Abdominal (+) + obese,   Peds  Hematology negative hematology ROS (+)   Anesthesia Other Findings   Reproductive/Obstetrics (+) Pregnancy                             Anesthesia Physical Anesthesia Plan  ASA: II  Anesthesia Plan: Epidural   Post-op Pain Management:    Induction:   PONV Risk Score and Plan:   Airway Management Planned:   Additional Equipment:   Intra-op Plan:   Post-operative Plan:   Informed Consent: I have reviewed the patients History and Physical, chart, labs and discussed the procedure including the risks, benefits and alternatives for the proposed anesthesia with the patient or authorized representative who has indicated his/her understanding and acceptance.     Plan Discussed with:   Anesthesia Plan Comments:         Anesthesia Quick Evaluation

## 2017-08-03 NOTE — Progress Notes (Signed)
Kirbi Farrugia is a 22 y.o. G1P0 at [redacted]w[redacted]d by LMP admitted forfor induction of labor due to Post dates.   Subjective: Patient resting in bed comfortably, tolerating contractions well on epidural. She reports some nausea  Objective: BP 106/66   Pulse 75   Temp 98.1 F (36.7 C) (Oral)   Resp 18   Ht  (1.626 m)   Wt 91.2 kg (201 lb)   LMP 10/21/2016 (Approximate)   BMI 34.50 kg/m  No intake/output data recorded. No intake/output data recorded.  FHT:  FHR: 125 bpm, variability: moderate,  accelerations:  Present,  decelerations:  Absent UC:   irregular, every 2-4 minutes SVE:   Dilation: 7 Effacement (%): 100 Station: -1 Exam by:: Zerita Boers, CNM   Labs: Lab Results  Component Value Date   WBC 18.0 (H) 08/03/2017   HGB 13.9 08/03/2017   HCT 40.6 08/03/2017   MCV 89.4 08/03/2017   PLT 129 (L) 08/03/2017    Assessment / Plan: Induction of labor due to postterm,  progressing well on pitocin  Labor: FB out. Progressing on Pitocin, will continue to increase then AROM Fetal Wellbeing:  Category I Pain Control:  Epidural I/D:  GBS negative\ Anticipated MOD:  NSVD  Felisa Bonier, PGY-1 Family Medicine - [facility] [place] 08/03/2017, 2:29 PM

## 2017-08-03 NOTE — Anesthesia Procedure Notes (Signed)
Epidural Patient location during procedure: OB Start time: 08/03/2017 10:36 AM End time: 08/03/2017 10:44 AM  Staffing Anesthesiologist: Leilani Able, MD Performed: anesthesiologist   Preanesthetic Checklist Completed: patient identified, site marked, surgical consent, pre-op evaluation, timeout performed, IV checked, risks and benefits discussed and monitors and equipment checked  Epidural Patient position: sitting Prep: site prepped and draped and DuraPrep Patient monitoring: continuous pulse ox and blood pressure Approach: midline Location: L3-L4 Injection technique: LOR air and LOR saline  Needle:  Needle type: Tuohy  Needle gauge: 17 G Needle length: 9 cm and 9 Needle insertion depth: 7 cm Catheter type: closed end flexible Catheter size: 19 Gauge Catheter at skin depth: 12 cm Test dose: negative and Other  Assessment Sensory level: T10 Events: blood not aspirated, injection not painful, no injection resistance, negative IV test and no paresthesia  Additional Notes Reason for block:procedure for pain

## 2017-08-03 NOTE — Lactation Note (Signed)
This note was copied from a baby's chart. Lactation Consultation Note  Patient Name: Emily Levine Emily Levine Date: 08/03/2017 Reason for consult: Initial assessment;Primapara;1st time breastfeeding;Term  6 hours old FT female who is being exclusively BF by his mother, she's a P1. Baby was swaddled and sleeping in bassinet when entering the room, offered assistance with latch and both parents agreed as soon as baby started cueing.  LC took baby STS to mother's right breast in football position but baby unable to latch at this point, he kept opening his mouth wide but as soon as he would get the nipple/areola complex in his mouth he would pull away from the breast and unable to achieve or sustain the latch. LC did some suck training, baby's sucking is very uncoordinated, when finally got him into a more steady sucking pattern LC transition baby back to the breast, but the same thing would happen, he kept pulling away from the breast and finally fell asleep. Asked mom to call for latch assistance the next time baby is ready to feed. Discussed newborn sleeping cycle.  Mom already knows how to hand express and she was able to get some droplets of colostrum, noticed that she has flat nipples, breast shells and hand pump were given. Instructions, cleaning and storage for both were also reviewed. Mom will start wearing her shells tomorrow morning and will start pre-pumping to help evert her nipples. RN has already given mom a NS # 24 but no DEBP has been set up on the room yet, mom stated that she hasn't tried NS yet neither was documented on the flowsheet. If mom decides to use a NS she'll let her RN know to observe the feeding, mom may need a # 20 NS instead; lactation to re-evaluate mom tomorrow.  Encouraged mom to feed baby STS 8-12 times/24 hours or sooner if feeding cues are present. If baby not cueing in a 3 hour period, she'll place him STS to the breast to give him the opportunity to feed. She'll pump  prior feedings and wear her breast shells in between feedings during the day time.  BF brochure, BF resources and feeding diary were reviewed. Both parents are aware of LC services and will call PRN.  Maternal Data Formula Feeding for Exclusion: No Has patient been taught Hand Expression?: Yes Does the patient have breastfeeding experience prior to this delivery?: No  Feeding Feeding Type: Breast Fed Length of feed: 5 min  LATCH Score Latch: Repeated attempts needed to sustain latch, nipple held in mouth throughout feeding, stimulation needed to elicit sucking reflex.  Audible Swallowing: A few with stimulation  Type of Nipple: Flat  Comfort (Breast/Nipple): Soft / non-tender  Hold (Positioning): Assistance needed to correctly position infant at breast and maintain latch.  LATCH Score: 6  Interventions Interventions: Breast feeding basics reviewed;Assisted with latch;Skin to skin;Breast massage;Hand express;Breast compression;Reverse pressure;Support pillows;Adjust position;Shells;Hand pump  Lactation Tools Discussed/Used Tools: Pump;Shells Breast pump type: Manual WIC Program: Yes Pump Review: Setup, frequency, and cleaning;Milk Storage Initiated by:: Emily Levine Date initiated:: 08/04/17   Consult Status Consult Status: Follow-up Date: 08/04/17 Follow-up type: In-patient    Emily Levine Emily Levine 08/03/2017, 11:55 PM

## 2017-08-03 NOTE — H&P (Addendum)
LABOR AND DELIVERY ADMISSION HISTORY AND PHYSICAL NOTE  Emily Levine is a 22 y.o. female G1P0 with IUP at [redacted]w[redacted]d by LMp and early ultrasound presenting for IOL for postdates. Had foley bulb placed in MAU yesterday. Started having contractions about 11pm last night and had some bloody show.  She reports positive fetal movement. She denies leakage of fluid.  Prenatal History/Complications: LGSIL on pap   Past Medical History: Past Medical History:  Diagnosis Date  . Medical history non-contributory     Past Surgical History: Past Surgical History:  Procedure Laterality Date  . HERNIA MESH REMOVAL  2000  . HERNIA REPAIR    . TONSILLECTOMY      Obstetrical History: OB History    Gravida  1   Para      Term      Preterm      AB      Living        SAB      TAB      Ectopic      Multiple      Live Births              Social History: Social History   Socioeconomic History  . Marital status: Single    Spouse name: Not on file  . Number of children: Not on file  . Years of education: Not on file  . Highest education level: Not on file  Occupational History  . Not on file  Social Needs  . Financial resource strain: Not on file  . Food insecurity:    Worry: Not on file    Inability: Not on file  . Transportation needs:    Medical: Not on file    Non-medical: Not on file  Tobacco Use  . Smoking status: Never Smoker  . Smokeless tobacco: Never Used  Substance and Sexual Activity  . Alcohol use: No  . Drug use: No  . Sexual activity: Yes    Birth control/protection: None  Lifestyle  . Physical activity:    Days per week: Not on file    Minutes per session: Not on file  . Stress: Not on file  Relationships  . Social connections:    Talks on phone: Not on file    Gets together: Not on file    Attends religious service: Not on file    Active member of club or organization: Not on file    Attends meetings of clubs or organizations: Not on file     Relationship status: Not on file  Other Topics Concern  . Not on file  Social History Narrative  . Not on file    Family History: Family History  Problem Relation Age of Onset  . Cancer Maternal Grandmother     Allergies: No Known Allergies  Medications Prior to Admission  Medication Sig Dispense Refill Last Dose  . calcium carbonate (TUMS - DOSED IN MG ELEMENTAL CALCIUM) 500 MG chewable tablet Chew 1 tablet by mouth as needed for indigestion or heartburn.   Taking  . Calcium Carbonate-Vitamin D (CALCIUM 600+D) 600-200 MG-UNIT TABS Take 1 tablet by mouth 2 (two) times daily.   Taking  . FOLIC ACID PO Take 1 tablet by mouth daily.   Taking  . Prenatal Vit-Fe Fumarate-FA (MULTIVITAMIN-PRENATAL) 27-0.8 MG TABS tablet Take 1 tablet by mouth daily at 12 noon. 30 each 9 Taking     Review of Systems   All systems reviewed and negative except as stated in HPI  Last menstrual period 10/21/2016. General appearance: alert, cooperative and appears stated age Lungs: no respiratory distress Heart: regular rate Abdomen: soft, non-tender Extremities: No calf swelling or tenderness Presentation: cephalic by bedside ultrasound Fetal monitoring: 130 bpm, moderate variability, + accelerations Uterine activity: q3-84min     Prenatal labs: ABO, Rh: A/Positive/-- (11/05 1533) Antibody: Negative (11/05 1533) Rubella: immune RPR: Non Reactive (02/18 1108)  HBsAg: Negative (11/05 1533)  HIV: Non Reactive (02/18 1108)  GBS:   neg Genetic screening:  neg Anatomy US: nonpathogenic echogenic intracardiac focus, otherwise normal  Prenatal Transfer Tool  Maternal Diabetes: No Genetic Screening: Normal Maternal Ultrasounds/Referrals: Abnormal:  Findings:   Isolated EIF (echogenic intracardiac focus) Fetal Ultrasounds or other Referrals:  None Maternal Substance Abuse:  No Significant Maternal Medications:  None Significant Maternal Lab Results: Lab values include: Group B Strep  negative  No results found for this or any previous visit (from the past 24 hour(s)).  Patient Active Problem List   Diagnosis Date Noted  . 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 LGSIL on Pap smear of cervix 01/20/2017  . Supervision of normal first pregnancy, antepartum 01/13/2017    Assessment: Emily Levine is a 22 y.o. G1P0 at [redacted]w[redacted]d here for IOL for post dates  #Labor:outpatient foley bulb in place  #Pain: Nitrous oxide, planning on epidural on active labor #FWB: Cat I #ID:  GBS neg #MOF: breast #MOC:IUD #Circ:  no  Leland Her, DO PGY-2 5/26/20196:59 AM  I examined this pt and agree witrh the above assessemnt

## 2017-08-03 NOTE — Progress Notes (Signed)
Emily Levine is a 22 y.o. G1P0 at [redacted]w[redacted]d by LMP admitted for induction of labor due to Post dates. Due date 05/21.  Subjective:   Objective: BP (!) 103/55   Pulse 75   Temp 98.5 F (36.9 C) (Oral)   Resp 16   Ht  (1.626 m)   Wt 91.2 kg (201 lb)   LMP 10/21/2016 (Approximate)   BMI 34.50 kg/m  No intake/output data recorded. No intake/output data recorded.  FHT:  FHR: 130 bpm, variability: moderate,  accelerations:  Present,  decelerations:  Absent UC:   regular, every 2-3 minutes SVE:   FB placed outpatient, not checked since  Labs: Lab Results  Component Value Date   WBC 18.0 (H) 08/03/2017   HGB 13.9 08/03/2017   HCT 40.6 08/03/2017   MCV 89.4 08/03/2017   PLT 129 (L) 08/03/2017    Assessment / Plan: Induction of labor due to postterm  Labor: Progressing normally . Will recheck once FB is out and plan on starting Pitocin Fetal Wellbeing:  Category I Pain Control:  Epidural I/D:  GBS negative Anticipated MOD:  NSVD  Felisa Bonier, MD, PGY-1 Family Medicine - [facility] [place] 08/03/2017, 10:23 AM

## 2017-08-03 NOTE — Anesthesia Pain Management Evaluation Note (Signed)
  CRNA Pain Management Visit Note  Patient: Emily Levine, 22 y.o., female  "Hello I am a member of the anesthesia team at [facility]. We have an anesthesia team available at all times to provide care throughout the hospital, including epidural management and anesthesia for C-section. I don't know your plan for the delivery whether it a natural birth, water birth, IV sedation, nitrous supplementation, doula or epidural, but we want to meet your pain goals."   1.Was your pain managed to your expectations on prior hospitalizations?   No prior hospitalizations  2.What is your expectation for pain management during this hospitalization?     Epidural  3.How can we help you reach that goal? Epidural   Record the patient's initial score and the patient's pain goal.   Pain2: 6   Pain Goal: 5 [facility] wants you to be able to say your pain was always managed very well.  [facility] 08/03/2017

## 2017-08-04 LAB — CBC
HCT: 36 % (ref 36.0–46.0)
Hemoglobin: 12.1 g/dL (ref 12.0–15.0)
MCH: 30.5 pg (ref 26.0–34.0)
MCHC: 33.6 g/dL (ref 30.0–36.0)
MCV: 90.7 fL (ref 78.0–100.0)
Platelets: 102 K/uL — ABNORMAL LOW (ref 150–400)
RBC: 3.97 MIL/uL (ref 3.87–5.11)
RDW: 14.8 % (ref 11.5–15.5)
WBC: 16 K/uL — ABNORMAL HIGH (ref 4.0–10.5)

## 2017-08-04 NOTE — Anesthesia Postprocedure Evaluation (Signed)
Anesthesia Post Note  Patient: Emily Levine  Procedure(s) Performed: AN AD HOC LABOR EPIDURAL     Patient location during evaluation: Mother Baby Anesthesia Type: Epidural Level of consciousness: awake and alert Pain management: pain level controlled Vital Signs Assessment: post-procedure vital signs reviewed and stable Respiratory status: spontaneous breathing, nonlabored ventilation and respiratory function stable Cardiovascular status: stable Postop Assessment: no headache, no backache and epidural receding Anesthetic complications: no    Last Vitals:  Vitals:   08/03/17 2200 08/04/17 0200  BP: (!) 105/59 99/72  Pulse: 81 74  Resp: 18 18  Temp: 37.3 C 37 C    Last Pain:  Vitals:   08/04/17 0521  TempSrc:   PainSc: 3    Pain Goal:                 Blythe Stanford

## 2017-08-04 NOTE — Lactation Note (Signed)
This note was copied from a baby's chart. Lactation Consultation Note  Patient Name: Boy Nalina Yeatman [id number] Date: 08/04/2017   Called by RN to assist. Mom was trying to use size 24 NS, but infant having difficulty. Hand expression was taught to Mom & the resulting colostrum was prefilled into nipple shield to aid in latching. Infant acted as if size 24 NS was too large for mouth. A size 20 NS was used instead & infant did better, but then became sleepy.  I encouraged Mom to continue hand expression (she prefers hand expression over pumping) to obtain colostrum to feed baby.   Mom w/flat nipples; on oral exam, infant seems to have slightly higher palate.   Lurline Hare [facility] 08/04/2017, 7:01 PM

## 2017-08-04 NOTE — Lactation Note (Signed)
This note was copied from a baby's chart. Lactation Consultation Note  Patient Name: Emily Levine AVWUJ'W Date: 08/04/2017 Reason for consult: Follow-up assessment;1st time breastfeeding;Primapara;Term;Difficult latch  P1 mother whose infant is now 82 hours old.   Infant has been very sleepy today.  Mother had just finished pumping 5 mls of colostrum so I showed mother how to spoon feed this back to baby.  After awakening baby I help to latch him on the right breast in the football hold.  Mother's breasts are soft and non tender and her nipples are short shafted.  No breakdown noted.  Infant remains sleepy but with much stimulation sucked in short bursts for a total of 10 minutes.  Demonstrated how to keep him awake at the breast.  Reviewed breast massage and hand expression with mother.  Reminded mother to keep her fingers back from the nipple while latching.  She does have a NS in the room but prefers not to use it if possible.  She also has breast shells and a manual pump.  I encouraged her to wear her breast shells in between feedings but not during sleep and to pump to help evert her nipples.  If she does have to use a NS I would suggest the #20 due to the infant's mouth size instead of the #24.  Mother very interested in learning and asked many appropriate questions.  She was able to return demonstrate hand expression with colostrum drops flowing easily from nipple.  Infant put back STS with mother after feeding and she will continue to watch for feeding cues.  She will call for assistance as needed.  Mother will pump with the DEBP after feedings and spoon feed any EBM she obtains back to baby.  She will call for latch assistance as needed.   Maternal Data Formula Feeding for Exclusion: No Has patient been taught Hand Expression?: Yes Does the patient have breastfeeding experience prior to this delivery?: No  Feeding Feeding Type: Breast Fed Length of feed: 10 min  LATCH  Score Latch: Repeated attempts needed to sustain latch, nipple held in mouth throughout feeding, stimulation needed to elicit sucking reflex.  Audible Swallowing: A few with stimulation  Type of Nipple: Everted at rest and after stimulation(short shaft;shells provided)  Comfort (Breast/Nipple): Soft / non-tender  Hold (Positioning): Assistance needed to correctly position infant at breast and maintain latch.  LATCH Score: 7  Interventions Interventions: Breast feeding basics reviewed;Assisted with latch;Skin to skin;Breast massage;Hand express;Position options;Support pillows;Breast compression;Adjust position;Shells;Hand pump  Lactation Tools Discussed/Used Tools: Shells;Pump;Flanges;Nipple Shields Nipple shield size: 20 Flange Size: 24 Shell Type: Inverted Breast pump type: Double-Electric Breast Pump;Manual   Consult Status Consult Status: Follow-up Date: 08/05/17 Follow-up type: In-patient    Dora Sims 08/04/2017, 10:55 PM

## 2017-08-04 NOTE — Progress Notes (Addendum)
Post Partum Day 1 Subjective: up ad lib, voiding, tolerating PO and states has some dizziness when OOB  Objective: Blood pressure 99/72, pulse 74, temperature 98.6 F (37 C), temperature source Axillary, resp. rate 18, height  (1.626 m), weight 201 lb (91.2 kg), last menstrual period 10/21/2016.  Physical Exam:  General: alert, cooperative and no distress Lochia: appropriate Uterine Fundus: firm Incision: n/a DVT Evaluation: No evidence of DVT seen on physical exam.  Recent Labs    08/03/17 0723 08/03/17 1945  HGB 13.9 12.5  HCT 40.6 37.4    Ref. Range 08/03/2017 07:23 08/03/2017 19:45  Platelets Latest Ref Range: 150 - 400 K/uL 129 (L) 110 (L)    Assessment/Plan: Plan for discharge tomorrow and Breastfeeding Mild thrombocytopenia  >> recheck CBC today   LOS: 1 day   Wynelle Bourgeois 08/04/2017, 6:19 AM

## 2017-08-04 NOTE — Progress Notes (Signed)
CSW received consult for hx of Anxiety and Depression.  CSW met with MOB to offer support and complete assessment.    When CSW arrived, MOB was bonding with infant as evidence by engaging in skin to skin.  MOB appeared very comfortable and was attentive to infant during the assessment. MOB was easy to engage and was receptive to meeting with CSW.   CSW asked about MOB's Mh hx and MOB openly shared a hx of depression since age 22.  MOB reported being on a medication regiment from age 34 to 8.  MOB discussed discontinuing medication and managing MOB's symptoms with natural interventions (working, staying busy, walking, and spending more time with family and friends).  MOB reported having increase mood instability and attributed her symptoms to a lack of family and friend support.  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  MOB communicated that MOB has very little supports and that has impacted her mood.  MOB stated, "I know I would feel better if I was with my mom and other relatives." CSW validated and normalized MOB's feelings and processed ways that MOB could improve her support team locally.   CSW provided education regarding the baby blues period vs. perinatal mood disorders, discussed treatment and gave resources for mental health follow up if concerns arise.  CSW recommends self-evaluation during the postpartum time period using the New Mom Checklist from Postpartum Progress and encouraged MOB to contact a medical professional if symptoms are noted at any time.  CSW assessed for safety and MOB denied SI and HI.  MOB was receptive to outpatient resources and agreed to make contact if needed. MOB presented with insight and awareness and appeared to comfortable seeking help if help is needed.   MOB reported having all essentials needed for infant and feeling prepared to parent.     CSW identifies no further need for intervention and no  barriers to discharge at this time.  Laurey Arrow, MSW, LCSW Clinical Social Work [phone]

## 2017-08-05 LAB — CBC
HEMATOCRIT: 36 % (ref 36.0–46.0)
HEMOGLOBIN: 11.9 g/dL — AB (ref 12.0–15.0)
MCH: 30.1 pg (ref 26.0–34.0)
MCHC: 33.1 g/dL (ref 30.0–36.0)
MCV: 91.1 fL (ref 78.0–100.0)
Platelets: 114 10*3/uL — ABNORMAL LOW (ref 150–400)
RBC: 3.95 MIL/uL (ref 3.87–5.11)
RDW: 14.9 % (ref 11.5–15.5)
WBC: 10.6 10*3/uL — ABNORMAL HIGH (ref 4.0–10.5)

## 2017-08-05 MED ORDER — TETANUS-DIPHTH-ACELL PERTUSSIS 5-2.5-18.5 LF-MCG/0.5 IM SUSP: 0.5000 mL | Freq: Once | INTRAMUSCULAR | 0 refills | Status: AC

## 2017-08-05 MED ORDER — MEASLES, MUMPS & RUBELLA VAC ~~LOC~~ INJ: 0.5000 mL | INJECTION | Freq: Once | SUBCUTANEOUS | 0 refills | Status: AC

## 2017-08-05 MED ORDER — BENZOCAINE-MENTHOL 20-0.5 % EX AERO: 1.0000 "application " | INHALATION_SPRAY | CUTANEOUS | Status: DC | PRN

## 2017-08-05 MED ORDER — WITCH HAZEL-GLYCERIN EX PADS: 1.0000 "application " | MEDICATED_PAD | CUTANEOUS | 12 refills | Status: DC | PRN

## 2017-08-05 MED ORDER — IBUPROFEN 600 MG PO TABS: 600.0000 mg | ORAL_TABLET | Freq: Four times a day (QID) | ORAL | 0 refills | Status: DC

## 2017-08-05 MED ORDER — COCONUT OIL OIL: 1.0000 "application " | TOPICAL_OIL | 0 refills | Status: DC | PRN

## 2017-08-05 MED ORDER — SENNOSIDES-DOCUSATE SODIUM 8.6-50 MG PO TABS: 2.0000 | ORAL_TABLET | ORAL | 0 refills | Status: AC

## 2017-08-05 MED ORDER — DIBUCAINE 1 % RE OINT: 1.0000 "application " | TOPICAL_OINTMENT | RECTAL | 0 refills | Status: DC | PRN

## 2017-08-05 MED ORDER — ACETAMINOPHEN 325 MG PO TABS: 650.0000 mg | ORAL_TABLET | ORAL | Status: AC | PRN

## 2017-08-05 NOTE — Lactation Note (Signed)
This note was copied from a baby's chart. Lactation Consultation Note  Patient Name: Emily Levine ZOXWR'U Date: 08/05/2017    RN Request for Latch Assistance:  Mother and RN attempted to latch infant without success.  I attempted to latch baby in the football hold on the right breast with little success.  Mother prefers to not use the NS.  I suggested trying the NS and infant was able to latch after a few attempts.  However, he would only suck a few times before stopping.  He needed continued stimulation to continue sucking and then it was still ineffecitve.    I also attempted the cross cradle and he latched but still would not effectively suck.  A few strong sucks were all he would do before falling asleep.  Mother does not want any formula supplementation.   Mother pumped using the DEBP for 15 minutes and was able to obtain 2 mls of colostrum which was spoon fed back to baby.  After taking this amount he was satisfied and fell asleep.  I reminded mother to watch for feeding cues and to awaken infant for feeds if he does not self awaken.  She will pump every 3 hours and continue to do hand expression.  Colostrum container provided for any EBM she obtains.  Mother will call for assistance at the next feed to be sure baby begins feeding better.                 Shemeika Starzyk R Bettey Muraoka 08/05/2017, 7:04 AM

## 2017-08-05 NOTE — Discharge Instructions (Signed)
Vaginal Delivery, Care After °Refer to this sheet in the next few weeks. These instructions provide you with information about caring for yourself after vaginal delivery. Your health care provider may also give you more specific instructions. Your treatment has been planned according to current medical practices, but problems sometimes occur. Call your health care provider if you have any problems or questions. °What can I expect after the procedure? °After vaginal delivery, it is common to have: °· Some bleeding from your vagina. °· Soreness in your abdomen, your vagina, and the area of skin between your vaginal opening and your anus (perineum). °· Pelvic cramps. °· Fatigue. ° °Follow these instructions at home: °Medicines °· Take over-the-counter and prescription medicines only as told by your health care provider. °· If you were prescribed an antibiotic medicine, take it as told by your health care provider. Do not stop taking the antibiotic until it is finished. °Driving ° °· Do not drive or operate heavy machinery while taking prescription pain medicine. °· Do not drive for 24 hours if you received a sedative. °Lifestyle °· Do not drink alcohol. This is especially important if you are breastfeeding or taking medicine to relieve pain. °· Do not use tobacco products, including cigarettes, chewing tobacco, or e-cigarettes. If you need help quitting, ask your health care provider. °Eating and drinking °· Drink at least 8 eight-ounce glasses of water every day unless you are told not to by your health care provider. If you choose to breastfeed your baby, you may need to drink more water than this. °· Eat high-fiber foods every day. These foods may help prevent or relieve constipation. High-fiber foods include: °? Whole grain cereals and breads. °? Brown rice. °? Beans. °? Fresh fruits and vegetables. °Activity °· Return to your normal activities as told by your health care provider. Ask your health care provider  what activities are safe for you. °· Rest as much as possible. Try to rest or take a nap when your baby is sleeping. °· Do not lift anything that is heavier than your baby or 10 lb (4.5 kg) until your health care provider says that it is safe. °· Talk with your health care provider about when you can engage in sexual activity. This may depend on your: °? Risk of infection. °? Rate of healing. °? Comfort and desire to engage in sexual activity. °Vaginal Care °· If you have an episiotomy or a vaginal tear, check the area every day for signs of infection. Check for: °? More redness, swelling, or pain. °? More fluid or blood. °? Warmth. °? Pus or a bad smell. °· Do not use tampons or douches until your health care provider says this is safe. °· Watch for any blood clots that may pass from your vagina. These may look like clumps of dark red, brown, or black discharge. °General instructions °· Keep your perineum clean and dry as told by your health care provider. °· Wear loose, comfortable clothing. °· Wipe from front to back when you use the toilet. °· Ask your health care provider if you can shower or take a bath. If you had an episiotomy or a perineal tear during labor and delivery, your health care provider may tell you not to take baths for a certain length of time. °· Wear a bra that supports your breasts and fits you well. °· If possible, have someone help you with household activities and help care for your baby for at least a few days after   you leave the hospital. °· Keep all follow-up visits for you and your baby as told by your health care provider. This is important. °Contact a health care provider if: °· You have: °? Vaginal discharge that has a bad smell. °? Difficulty urinating. °? Pain when urinating. °? A sudden increase or decrease in the frequency of your bowel movements. °? More redness, swelling, or pain around your episiotomy or vaginal tear. °? More fluid or blood coming from your episiotomy or  vaginal tear. °? Pus or a bad smell coming from your episiotomy or vaginal tear. °? A fever. °? A rash. °? Little or no interest in activities you used to enjoy. °? Questions about caring for yourself or your baby. °· Your episiotomy or vaginal tear feels warm to the touch. °· Your episiotomy or vaginal tear is separating or does not appear to be healing. °· Your breasts are painful, hard, or turn red. °· You feel unusually sad or worried. °· You feel nauseous or you vomit. °· You pass large blood clots from your vagina. If you pass a blood clot from your vagina, save it to show to your health care provider. Do not flush blood clots down the toilet without having your health care provider look at them. °· You urinate more than usual. °· You are dizzy or light-headed. °· You have not breastfed at all and you have not had a menstrual period for 12 weeks after delivery. °· You have stopped breastfeeding and you have not had a menstrual period for 12 weeks after you stopped breastfeeding. °Get help right away if: °· You have: °? Pain that does not go away or does not get better with medicine. °? Chest pain. °? Difficulty breathing. °? Blurred vision or spots in your vision. °? Thoughts about hurting yourself or your baby. °· You develop pain in your abdomen or in one of your legs. °· You develop a severe headache. °· You faint. °· You bleed from your vagina so much that you fill two sanitary pads in one hour. °This information is not intended to replace advice given to you by your health care provider. Make sure you discuss any questions you have with your health care provider. °Document Released: 02/23/2000 Document Revised: 08/09/2015 Document Reviewed: 03/12/2015 °Elsevier Interactive Patient Education © 2018 [employer] ° °

## 2017-08-05 NOTE — Lactation Note (Signed)
This note was copied from a baby's chart. Lactation Consultation Note: Mother attempting to latch infant when I arrived in the room. Advised mother in better positioning and using good support pillows. Assist mother with positioning infant in cross cradle hold. Infant latched on and off for 10 mins. Infant has a difficult time getting the entire nipple in his mouth. Mothers nipples are large and semi-flat. Mother was fit earlier with a nipple shield. She reports that she prefers not to use the shield. Mother reports that she feels strong tugging without pain when infant is latched well. She describes observing swallows. Discussed cluster feeding , cue base feeding and advised mother to feed infant at least 8-12 feeds in a 24 hours period. Discussed the use of the nipple shield if unable to get infant latched well. Advised mother to post pump for 15 mins with her electric pump at home if using the shield. Advised to observed for milk in the shield if using the shield. Discussed treatment and prevention of engorgement. Mother to continue to do frequent skin to skin. Mother is aware of available LC services, [facility] and outpatient dept. Mother to follow up as needed.  Patient Name: Emily Levine ZOXWR'U Date: 08/05/2017 Reason for consult: Follow-up assessment   Maternal Data    Feeding Feeding Type: Breast Fed Length of feed: 10 min  LATCH Score Latch: Grasps breast easily, tongue down, lips flanged, rhythmical sucking.  Audible Swallowing: A few with stimulation  Type of Nipple: Everted at rest and after stimulation(short shafted nipples)  Comfort (Breast/Nipple): Soft / non-tender  Hold (Positioning): No assistance needed to correctly position infant at breast.  LATCH Score: 9  Interventions Interventions: Skin to skin;Hand express;Adjust position;Support pillows;Expressed milk;Shells;Hand pump  Lactation Tools Discussed/Used     Consult Status Consult Status:  Complete    Michel Bickers 08/05/2017, 11:34 AM

## 2017-08-05 NOTE — Discharge Summary (Addendum)
OB Discharge Summary     Patient Name: Emily Levine DOB: [birth year] MRN: [id number]  Date of admission: 08/03/2017 Delivering MD: Koren Shiver D   Date of discharge: 08/05/2017  Admitting diagnosis: 40 wk induction Intrauterine pregnancy: [redacted]w[redacted]d    Secondary diagnosis:  Active Problems:   Post-dates pregnancy   Status post vacuum-assisted vaginal delivery  Additional problems: IOL for postdate     Discharge diagnosis: Term Pregnancy Delivered                                                                                                Post partum procedures:None  Augmentation: Pitocin, Foley Balloon and VAVD  Complications: None  Hospital course:  Induction of Labor With Vaginal Delivery   22y.o. yo G1P0 at 441w1das admitted to the hospital 08/03/2017 for induction of labor.  Indication for induction: Postdates and Vacuum Assistance.  Patient had an uncomplicated labor course as follows: Membrane Rupture Time/Date: 12:34 PM ,08/03/2017   Intrapartum Procedures: Episiotomy: None [1]                                         Lacerations:  Vaginal [6]  Patient had delivery of a Viable infant.  Information for the patient's newborn:  RiSherrelle, Prochazka0[[id number]]Delivery Method: Vaginal, Vacuum (Extractor)(Filed from Delivery Summary)   08/03/2017  Details of delivery can be found in separate delivery note.  Patient had a routine postpartum course. Patient is discharged home 08/05/17.  Physical exam  Vitals:   08/04/17 0200 08/04/17 1003 08/04/17 1828 08/05/17 0500  BP: 99/72 (!) 90/54 (!) 93/58 (!) 94/54  Pulse: 74 70 63 61  Resp: 18 20 20    Temp: 98.6 F (37 C) 97.6 F (36.4 C) 97.9 F (36.6 C) 98.1 F (36.7 C)  TempSrc: Axillary Oral Oral Oral  Weight:      Height:       General: alert and no distress Lochia: appropriate Uterine Fundus: firm DVT Evaluation: No evidence of DVT seen on physical exam. Labs: Lab Results  Component Value Date   WBC 10.6 (H)  08/05/2017   HGB 11.9 (L) 08/05/2017   HCT 36.0 08/05/2017   MCV 91.1 08/05/2017   PLT 114 (L) 08/05/2017   CMP Latest Ref Rng & Units 07/13/2017  Glucose 65 - 99 mg/dL 105(H)  BUN 6 - 20 mg/dL 14  Creatinine 0.44 - 1.00 mg/dL 0.53  Sodium 135 - 145 mmol/L 136  Potassium 3.5 - 5.1 mmol/L 3.5  Chloride 101 - 111 mmol/L 107  CO2 22 - 32 mmol/L 19(L)  Calcium 8.9 - 10.3 mg/dL 8.6(L)  Total Protein 6.5 - 8.1 g/dL 6.2(L)  Total Bilirubin 0.3 - 1.2 mg/dL 0.2(L)  Alkaline Phos 38 - 126 U/L 98  AST 15 - 41 U/L 24  ALT 14 - 54 U/L 24    Discharge instruction: per After Visit Summary and "Baby and Me Booklet".  After visit meds:  Allergies as of 08/05/2017   No  Known Allergies     Medication List    TAKE these medications   acetaminophen 325 MG tablet Commonly known as:  TYLENOL Take 2 tablets (650 mg total) by mouth every 4 (four) hours as needed (for pain scale < 4).   benzocaine-Menthol 20-0.5 % Aero Commonly known as:  DERMOPLAST Apply 1 application topically as needed for irritation (perineal discomfort).   CALCIUM 600+D 600-200 MG-UNIT Tabs Generic drug:  Calcium Carbonate-Vitamin D Take 1 tablet by mouth 2 (two) times daily.   calcium carbonate 500 MG chewable tablet Commonly known as:  TUMS - dosed in mg elemental calcium Chew 1 tablet by mouth as needed for indigestion or heartburn.   coconut oil Oil Apply 1 application topically as needed.   dibucaine 1 % Oint Commonly known as:  NUPERCAINAL Place 1 application rectally as needed for hemorrhoids.   ibuprofen 600 MG tablet Commonly known as:  ADVIL,MOTRIN Take 1 tablet (600 mg total) by mouth every 6 (six) hours.   measles, mumps and rubella vaccine injection Commonly known as:  MMR Inject 0.5 mLs into the skin once for 1 dose.   multivitamin-prenatal 27-0.8 MG Tabs tablet Take 1 tablet by mouth daily at 12 noon.   senna-docusate 8.6-50 MG tablet Commonly known as:  Senokot-S Take 2 tablets by mouth  daily. Start taking on:  08/06/2017   Tdap 5-2.5-18.5 LF-MCG/0.5 injection Commonly known as:  BOOSTRIX Inject 0.5 mLs into the muscle once for 1 dose.   witch hazel-glycerin pad Commonly known as:  TUCKS Apply 1 application topically as needed for hemorrhoids.       Diet: routine diet  Activity: Advance as tolerated. Pelvic rest for 6 weeks.   Outpatient follow up:6 weeks Follow up Appt:No future appointments. Follow up Visit:No follow-ups on file.  Postpartum contraception: IUD to be placed in outpatient once healed  Newborn Data: Live born female  Birth Weight: 7 lb 7.8 oz (3395 g) APGAR: 5, 7  Newborn Delivery   Birth date/time:  08/03/2017 17:52:00 Delivery type:  Vaginal, Vacuum (Extractor)     Baby Feeding: Breast Disposition:home with mother   08/05/2017 Bonnita Hollow, MD  I confirm that I have verified the information documented in the resident's note and that I have also personally reperformed the physical exam and all medical decision making activities.   Marcille Buffy 10:43 AM 08/05/17

## 2017-08-08 ENCOUNTER — Inpatient Hospital Stay (HOSPITAL_COMMUNITY)
Admission: AD | Admit: 2017-08-08 | Discharge: 2017-08-08 | Disposition: A | Discharge status: Home / Self Care | Payer: Medicaid Other | Source: Ambulatory Visit | Attending: Obstetrics and Gynecology | Admitting: Obstetrics and Gynecology

## 2017-08-08 ENCOUNTER — Other Ambulatory Visit: Payer: Self-pay

## 2017-08-08 ENCOUNTER — Encounter (HOSPITAL_COMMUNITY): Payer: Self-pay | Admitting: *Deleted

## 2017-08-08 DIAGNOSIS — R300 Dysuria: Secondary | ICD-10-CM | POA: Insufficient documentation

## 2017-08-08 DIAGNOSIS — K5900 Constipation, unspecified: Secondary | ICD-10-CM | POA: Insufficient documentation

## 2017-08-08 DIAGNOSIS — Z79899 Other long term (current) drug therapy: Secondary | ICD-10-CM | POA: Diagnosis not present

## 2017-08-08 DIAGNOSIS — O9089 Other complications of the puerperium, not elsewhere classified: Secondary | ICD-10-CM | POA: Diagnosis not present

## 2017-08-08 DIAGNOSIS — R102 Pelvic and perineal pain: Secondary | ICD-10-CM | POA: Insufficient documentation

## 2017-08-08 DIAGNOSIS — R42 Dizziness and giddiness: Secondary | ICD-10-CM | POA: Insufficient documentation

## 2017-08-08 LAB — URINALYSIS, ROUTINE W REFLEX MICROSCOPIC
Bilirubin Urine: NEGATIVE
Glucose, UA: NEGATIVE mg/dL
Hgb urine dipstick: NEGATIVE
Ketones, ur: NEGATIVE mg/dL
Leukocytes, UA: NEGATIVE
NITRITE: NEGATIVE
PH: 6 (ref 5.0–8.0)
Protein, ur: NEGATIVE mg/dL
SPECIFIC GRAVITY, URINE: 1.014 (ref 1.005–1.030)

## 2017-08-08 LAB — CBC
HCT: 41.5 % (ref 36.0–46.0)
Hemoglobin: 13.9 g/dL (ref 12.0–15.0)
MCH: 30.2 pg (ref 26.0–34.0)
MCHC: 33.5 g/dL (ref 30.0–36.0)
MCV: 90 fL (ref 78.0–100.0)
Platelets: 166 10*3/uL (ref 150–400)
RBC: 4.61 MIL/uL (ref 3.87–5.11)
RDW: 14.1 % (ref 11.5–15.5)
WBC: 9.5 10*3/uL (ref 4.0–10.5)

## 2017-08-08 MED ORDER — ESTRADIOL 0.1 MG/GM VA CREA: TOPICAL_CREAM | VAGINAL | 1 refills | Status: DC

## 2017-08-08 NOTE — MAU Provider Note (Addendum)
Evaluation and management procedures were performed by the under my supervision and collaboration. I have reviewed the note and chart, the note for the delivery and discharge, and I agree with the management and plan.  I examined the patient and Dr. Vergie Living came and examined the client also.   Chief Complaint: Dysuria; Constipation; Vaginal Pain; and Dizziness   SUBJECTIVE HPI: Emily Levine is a 22 y.o. G1P1 PPD#5 for SVD who presents to MAU with dizziness, dysuria, constipation, and vaginal pain.  The dizziness began 3 days ago and occurred about 3x a day when she would stand up from a seated position.  She decreased her water intake 2 days ago to half a bottle of water a day because she did not want to pee due to the pain.  She is also holding in her stools due to fear of the pain.  Her last BM was 2 days ago.  She is eating normally.  For breakfast she had a papoosa, mango, watermelon, banana, eggs and for lunch she ate a muffin while in MAU.  She reports having one stitch for her vaginal delivery.  On discharge from her delivery on Tuesday her vaginal pain was at a level of 5.  Now it is at a pain level of 8 when she pees and continues to be 4-5 when she walks.  Tylenol does not help her pain.  She uses Dermoplast but the relief of pain lasts only for 5 minutes.  Patient reports she had low blood pressures when she was discharged.  She is currently pumping for feeding.  She states that she was supposed to have a colposcopy for irregular pap smears and IUD placed after delivery but that did not happen.    Per record review patient had R side wall laceration repaired with 2.0 vicryl rapide and EBL of for delivery.    PMH Chlamydia treated 2 years ago  Endorses decreased vaginal bleeding  Denies fever, HA, CP, SOB, vaginal discharge, urinary urgency, abd pain  Alcohol No Tobacco No Illcit Drugs No  Past Medical History:  Diagnosis Date  . Medical history non-contributory    OB  History  Gravida Para Term Preterm AB Living  1            SAB TAB Ectopic Multiple Live Births               # Outcome Date GA Lbr Len/2nd Weight Sex Delivery Anes PTL Lv  1 Gravida            Past Surgical History:  Procedure Laterality Date  . HERNIA MESH REMOVAL  2000  . HERNIA REPAIR    . TONSILLECTOMY     Social History   Socioeconomic History  . Marital status: Single    Spouse name: Not on file  . Number of children: Not on file  . Years of education: Not on file  . Highest education level: Not on file  Occupational History  . Not on file  Social Needs  . Financial resource strain: Not on file  . Food insecurity:    Worry: Not on file    Inability: Not on file  . Transportation needs:    Medical: Not on file    Non-medical: Not on file  Tobacco Use  . Smoking status: Never Smoker  . Smokeless tobacco: Never Used  Substance and Sexual Activity  . Alcohol use: No  . Drug use: No  . Sexual activity: Not Currently  Birth control/protection: None  Lifestyle  . Physical activity:    Days per week: Not on file    Minutes per session: Not on file  . Stress: Not on file  Relationships  . Social connections:    Talks on phone: Not on file    Gets together: Not on file    Attends religious service: Not on file    Active member of club or organization: Not on file    Attends meetings of clubs or organizations: Not on file    Relationship status: Not on file  . Intimate partner violence:    Fear of current or ex partner: Not on file    Emotionally abused: Not on file    Physically abused: Not on file    Forced sexual activity: Not on file  Other Topics Concern  . Not on file  Social History Narrative  . Not on file   No current facility-administered medications on file prior to encounter.    Current Outpatient Medications on File Prior to Encounter  Medication Sig Dispense Refill  . acetaminophen (TYLENOL) 325 MG tablet Take 2 tablets (650 mg total) by  mouth every 4 (four) hours as needed (for pain scale < 4).    . benzocaine-Menthol (DERMOPLAST) 20-0.5 % AERO Apply 1 application topically as needed for irritation (perineal discomfort).    . calcium carbonate (TUMS - DOSED IN MG ELEMENTAL CALCIUM) 500 MG chewable tablet Chew 1 tablet by mouth as needed for indigestion or heartburn.    . Calcium Carbonate-Vitamin D (CALCIUM 600+D) 600-200 MG-UNIT TABS Take 1 tablet by mouth 2 (two) times daily.    . coconut oil OIL Apply 1 application topically as needed.  0  . dibucaine (NUPERCAINAL) 1 % OINT Place 1 application rectally as needed for hemorrhoids.  0  . ibuprofen (ADVIL,MOTRIN) 600 MG tablet Take 1 tablet (600 mg total) by mouth every 6 (six) hours. 30 tablet 0  . Prenatal Vit-Fe Fumarate-FA (MULTIVITAMIN-PRENATAL) 27-0.8 MG TABS tablet Take 1 tablet by mouth daily at 12 noon. 30 each 9  . senna-docusate (SENOKOT-S) 8.6-50 MG tablet Take 2 tablets by mouth daily. 60 tablet 0  . witch hazel-glycerin (TUCKS) pad Apply 1 application topically as needed for hemorrhoids. 40 each 12   No Known Allergies  I have reviewed the past Medical Hx, Surgical Hx, Social Hx, Allergies and Medications.   REVIEW OF SYSTEMS All systems reviewed and are negative for acute change except as noted in the HPI.  Review of Systems  Constitutional: Negative for fever.  Respiratory: Negative for shortness of breath.   Cardiovascular: Negative for chest pain.  Gastrointestinal: Positive for constipation. Negative for abdominal pain.  Genitourinary: Positive for dysuria. Negative for frequency, hematuria and urgency.  Skin: Negative for itching and rash.  Neurological: Positive for dizziness. Negative for headaches.  Psychiatric/Behavioral: Negative for substance abuse.   OBJECTIVE BP 110/73 (BP Location: Left Arm)   Pulse 61   Temp 98.3 F (36.8 C) (Oral)   Resp 18   Ht  (1.626 m)   Wt 184 lb (83.5 kg)   Breastfeeding? Unknown   BMI 31.58 kg/m     PHYSICAL EXAM Constitutional: Well-developed, well-nourished female in no acute distress.  Cardiovascular: normal rate and rhythm, pulses intact Respiratory: normal rate and effort.  MS: Extremities nontender, no edema, normal ROM Neurologic: Alert and oriented x 4. No focal deficits GU: R perineal laceration at 7 o'clock noted when examining the perineum.  V-shaped area approximately  1 cm in diameter - no redness, no erythema, no evidence of cellulitis.  Did have some yellow noted at the edges of the laceration. SPECULUM EXAM: Deferred BIMANUAL: Deferred Psych: normal mood and affect  LAB RESULTS Results for orders placed or performed during the hospital encounter of 08/08/17 (from the past 24 hour(s))  Urinalysis, Routine w reflex microscopic     Status: None   Collection Time: 08/08/17  1:38 PM  Result Value Ref Range   Color, Urine YELLOW YELLOW   APPearance CLEAR CLEAR   Specific Gravity, Urine 1.014 1.005 - 1.030   pH 6.0 5.0 - 8.0   Glucose, UA NEGATIVE NEGATIVE mg/dL   Hgb urine dipstick NEGATIVE NEGATIVE   Bilirubin Urine NEGATIVE NEGATIVE   Ketones, ur NEGATIVE NEGATIVE mg/dL   Protein, ur NEGATIVE NEGATIVE mg/dL   Nitrite NEGATIVE NEGATIVE   Leukocytes, UA NEGATIVE NEGATIVE  CBC     Status: None   Collection Time: 08/08/17  2:13 PM  Result Value Ref Range   WBC 9.5 4.0 - 10.5 K/uL   RBC 4.61 3.87 - 5.11 MIL/uL   Hemoglobin 13.9 12.0 - 15.0 g/dL   HCT 16.1 09.6 - 04.5 %   MCV 90.0 78.0 - 100.0 fL   MCH 30.2 26.0 - 34.0 pg   MCHC 33.5 30.0 - 36.0 g/dL   RDW 40.9 81.1 - 91.4 %   Platelets 166 150 - 400 K/uL     MAU Management/MDM: Vitals and nursing notes reviewed Orders Placed This Encounter  Procedures  . Urinalysis, Routine w reflex microscopic  . CBC  . Orthostatic vital signs  . Discharge patient Discharge disposition: 01-Home or Self Care; Discharge patient date: 08/08/2017    Meds ordered this encounter  Medications  . estradiol (ESTRACE) 0.1  MG/GM vaginal cream    Sig: 1.5 to 2 gm DAILY at bedtime for 2 weeks then 1 gm at bedtime TWICE A WEEK for 2 weeks    Dispense:  42.5 g    Refill:  1    Order Specific Question:   Supervising Provider    Answer:   Ardmore Bing [[id number]]   Venola Castello is a 22 y.o. G1P1 PPD#5 for SVD who presents to MAU with dizziness, dysuria, constipation, and vaginal pain.  Dizziness likely secondary to and poor fluid intake.  Patient currently hemodynamically stable, but will obtain CBC to assess for anemia.  Vaginal pain and dysuria likely secondary to vaginal delivery with inadequate pain control.  Infectious etiology unlikely since patient denies increased urgency, fever, purulent discharge, UA neg.  Constipation likely secondary to patient withholding bowel movement.    - CBC nml - orthostatic BP nml  Plan of care reviewed with patient, including labs and tests ordered and medical treatment.  Consult none.  Treatments in MAU included none.   ASSESSMENT 1. Perineal laceration during delivery, postpartum condition     PLAN Discharge home in stable condition. Counseled on return precautions if symptoms worsen Recommend patient do sitz bath 3x daily, use Dermoplast & Peri-bottle prior to going to bathroom, use patting motion with toilet paper, and ice packs as needed for vaginal pain. Start ibuprofen in addition to Tylenol as needed for pain. Start estrace per instructions    Allergies as of 08/08/2017   No Known Allergies     Medication List    TAKE these medications   acetaminophen 325 MG tablet Commonly known as:  TYLENOL Take 2 tablets (650 mg total) by mouth every 4 (four) hours  as needed (for pain scale < 4).   benzocaine-Menthol 20-0.5 % Aero Commonly known as:  DERMOPLAST Apply 1 application topically as needed for irritation (perineal discomfort).   CALCIUM 600+D 600-200 MG-UNIT Tabs Generic drug:  Calcium Carbonate-Vitamin D Take 1 tablet by mouth 2 (two) times daily.    calcium carbonate 500 MG chewable tablet Commonly known as:  TUMS - dosed in mg elemental calcium Chew 1 tablet by mouth as needed for indigestion or heartburn.   coconut oil Oil Apply 1 application topically as needed.   dibucaine 1 % Oint Commonly known as:  NUPERCAINAL Place 1 application rectally as needed for hemorrhoids.   estradiol 0.1 MG/GM vaginal cream Commonly known as:  ESTRACE 1.5 to 2 gm DAILY at bedtime for 2 weeks then 1 gm at bedtime TWICE A WEEK for 2 weeks   ibuprofen 600 MG tablet Commonly known as:  ADVIL,MOTRIN Take 1 tablet (600 mg total) by mouth every 6 (six) hours.   multivitamin-prenatal 27-0.8 MG Tabs tablet Take 1 tablet by mouth daily at 12 noon.   senna-docusate 8.6-50 MG tablet Commonly known as:  Senokot-S Take 2 tablets by mouth daily.   witch hazel-glycerin pad Commonly known as:  TUCKS Apply 1 application topically as needed for hemorrhoids.      Christel Mormon, Medical Student 08/08/2017, 4:57 PM  Nolene Bernheim, RN, MSN, NP-BC Nurse Practitioner, [facility] for [facility], [facility] 08/08/2017 4:57 PM

## 2017-08-08 NOTE — MAU Note (Signed)
Pt presents to MAU with c/o dizziness, swelling in feet that started 3 days ago. Pt had vaginal delivery on 5/26. Pt has also had vaginal pain where stitch is placed and urinary pain. Pt's last bowel movement was on 5/29, concerned about constipation.

## 2017-08-08 NOTE — Discharge Instructions (Signed)
Use ice packs and sitz bath daily.  Use the sitz bath 3 times a day for 15 minutes. Be sure to keep your postpartum check up appointment so this area can be reevaluated. Call the clinic sooner if the area is worsening and you need to be seen sooner. Use both the ibuprofen and tylenol by the package directions. Get the vaginal cream at your pharmacy - use about 80% of the dose inside the vagina and the other 20% use with your finger on the outside painful area.

## 2017-08-13 ENCOUNTER — Telehealth (HOSPITAL_BASED_OUTPATIENT_CLINIC_OR_DEPARTMENT_OTHER): Payer: Self-pay

## 2017-08-13 ENCOUNTER — Ambulatory Visit (HOSPITAL_BASED_OUTPATIENT_CLINIC_OR_DEPARTMENT_OTHER): Payer: PRIVATE HEALTH INSURANCE | Admitting: Family Medicine

## 2017-08-13 VITALS — BP 120/80 | Wt 184.0 lb

## 2017-08-13 DIAGNOSIS — L02215 Cutaneous abscess of perineum: Secondary | ICD-10-CM | POA: Diagnosis not present

## 2017-08-13 LAB — POC URINALYSIS
BILIRUBIN, URINE: NEGATIVE
GLUCOSE,URINE: NEGATIVE
KETONE, URINE: NEGATIVE
LEUKOCYTE ESTERASE: NEGATIVE
NITRITE, URINE: NEGATIVE
PH URINE: 7.5 (ref 5.0–8.0)
PROTEIN, URINE: NEGATIVE
SPECIFIC GRAVITY, URINE: 1.015 (ref 1.003–1.030)
UROBILINOGEN URINE: 0.2 (ref 0.2–1.0)

## 2017-08-13 MED ORDER — SULFAMETHOXAZOLE-TRIMETHOPRIM 800-160 MG PO TABS: 1 | tablet | Freq: Two times a day (BID) | ORAL | 0 refills | 0 days | Status: AC

## 2017-08-13 MED ORDER — FLUCONAZOLE 150 MG PO TABS: 150 mg | tablet | Freq: Once | ORAL | 0 refills | 0 days | Status: AC

## 2017-08-13 MED ORDER — SULFAMETHOXAZOLE-TRIMETHOPRIM 800-160 MG PO TABS
1.00 | ORAL_TABLET | Freq: Two times a day (BID) | ORAL | 0 refills | Status: AC
Start: 2017-08-13 — End: 2017-08-20

## 2017-08-13 MED ORDER — FLUCONAZOLE 150 MG PO TABS
150.00 mg | ORAL_TABLET | Freq: Once | ORAL | 0 refills | Status: AC
Start: 2017-08-13 — End: 2017-08-13

## 2017-08-13 NOTE — Progress Notes (Signed)
Returned patients call and spoke with her  Clarified patients prescription question regarding completing entire prescription for UTI

## 2017-08-13 NOTE — Progress Notes (Signed)
Rash in groin since Monday.  Recent episode of oral sex, menses with use of big pad.  No lotions or creams.  No new meds.  No allergies.  Recent tx for yeast infection after taking amoxicillin.  Sexually active, 2 partners in past year.  Using condoms with a lubricant--hasn't changed recently.

## 2017-08-13 NOTE — Progress Notes (Signed)
50F complaining of a rash in the groin.  Said she first started feeling some discomfort with sitting 2d ago.  By  Wilburn Mylar, it was red, swollen and more painful to the point that she's not able to sit down.     Patient Active Problem List:     BMI 35.0-35.9,adult     Family history of diabetes mellitus     Peripheral retinal degeneration, lattice     Low grade squamous intraepith lesion on cytologic smear cervix (lgsil)         Review of Systems   Constitutional: Negative for chills and fever.   Gastrointestinal: Negative for abdominal pain and constipation.   Genitourinary: Positive for dysuria.       Physical Exam   Constitutional: She appears well-developed and well-nourished.   BP 120/80  Wt 83.5 kg (184 lb)  BMI 31.58 kg/m2   Genitourinary:   Genitourinary Comments: In the R perineum is well defined area of redness with central fluctuance.  Injected 3cc of of lidocaine in the area of fluctuance.  2cm linear incision made of the fluctuance draining app 15cc of bloody pustular material.  Loculations released with cotton applicator with a 4cm pocket.  Packing applied.  Area of erythema marked       ASSESSMENT/PLAN  (L02.215) Perineal abscess  (primary encounter diagnosis)  Plan: sulfamethoxazole-trimethoprim (BACTRIM DS)         800-160 MG per tablet, fluconazole (DIFLUCAN)         150 MG tablet, I&D HEMATOMA SEROMA/FLUID         COLLECTION

## 2017-08-13 NOTE — Telephone Encounter (Signed)
-----   Message from [facility] sent at 08/13/2017  2:20 PM EDT -----  Regarding: Med Question   Contact: [phone]  Royelle Hinchman,  [id number],  22 year old,  female  Telephone Information:  Home Phone      [phone]  Work Phone      [phone]  Mobile          [phone]    Patient's PCP: [name] MD    Patient's language of care:   English    Person calling on behalf of patient: Patient (self)     Calls today requesting to speak to a nurse. States she was prescribed a medication today and has questions on directions.       CALL BACK NUMBER: [phone]

## 2017-08-15 ENCOUNTER — Ambulatory Visit (HOSPITAL_BASED_OUTPATIENT_CLINIC_OR_DEPARTMENT_OTHER): Payer: PRIVATE HEALTH INSURANCE | Admitting: Family Medicine

## 2017-08-15 VITALS — BP 110/70 | HR 100 | Temp 97.2°F | Wt 184.0 lb

## 2017-08-15 DIAGNOSIS — L02215 Cutaneous abscess of perineum: Secondary | ICD-10-CM

## 2017-08-15 DIAGNOSIS — Z789 Other specified health status: Secondary | ICD-10-CM

## 2017-08-15 NOTE — Progress Notes (Addendum)
9F here for f/u of R perineal abscess.  S/p I&D on 08/13/17 and on d2 of augmentin.  Reports 90% improvement in symptoms with significant improvement in pain, swelling and redness.  She is able to sit and walk comfortably.  Noticed the packing is longer than Wed.        Patient Active Problem List:     BMI 35.0-35.9,adult     Family history of diabetes mellitus     Peripheral retinal degeneration, lattice     Low grade squamous intraepith lesion on cytologic smear cervix (lgsil)         Review of Systems   Constitutional: Negative for chills and fever.   Gastrointestinal: Negative for constipation and diarrhea.   Genitourinary: Negative for dysuria.   Neurological: Negative for dizziness.       Physical Exam   Constitutional: She appears well-developed and well-nourished.   BP 110/70 (Site: LA, Position: Sitting, Cuff Size: Reg)  Pulse 100  Temp 97.2 F (36.2 C) (Temporal)  Wt 83.5 kg (184 lb)  LMP 08/08/2017  SpO2 99%  BMI 31.58 kg/m2   Cardiovascular: Normal rate.   Pulmonary/Chest: Effort normal.   Skin:   Significant improvement in the redness although less dramatic improvement in the covered area.  Removed app 12in of pack and repacked with app 4in.         ASSESSMENT/PLAN  (L02.215) Perineal abscess  Comment: Subjective and objective improvement.  Continue augmentin as prescribed.  RTC 08/20/17

## 2017-08-15 NOTE — Addendum Note (Signed)
Addended by: Dorthula Perfect on: 08/15/2017 12:45 PM     Modules accepted: Orders, SmartSet

## 2017-08-18 ENCOUNTER — Telehealth (HOSPITAL_BASED_OUTPATIENT_CLINIC_OR_DEPARTMENT_OTHER): Payer: Self-pay | Admitting: Family Medicine

## 2017-08-18 NOTE — Telephone Encounter (Signed)
-----   Message from [facility] sent at 08/18/2017 12:42 PM EDT -----  Regarding: benefir analysis  Hi,     Can you please do a benefit analysis for Tdap and send message to RN pool?    Thanks,   Prudence Davidson Da Quincy Simmonds, 08/18/2017

## 2017-08-18 NOTE — Progress Notes (Unsigned)
Tatiana from [facility] called the Central Refill Department to complete a benefit analysis for the TDAP Vaccine.  The vaccine is covered under the patient’s prescription coverage.     Please choose Prior Auth

## 2017-08-20 ENCOUNTER — Other Ambulatory Visit: Payer: Self-pay | Admitting: Nurse Practitioner

## 2017-08-20 ENCOUNTER — Ambulatory Visit (HOSPITAL_BASED_OUTPATIENT_CLINIC_OR_DEPARTMENT_OTHER): Payer: PRIVATE HEALTH INSURANCE | Admitting: Family Medicine

## 2017-08-20 VITALS — BP 110/64 | HR 92 | Temp 97.0°F | Wt 187.0 lb

## 2017-08-20 DIAGNOSIS — L02215 Cutaneous abscess of perineum: Secondary | ICD-10-CM

## 2017-08-20 MED ORDER — ESTRADIOL 0.1 MG/GM VA CREA: TOPICAL_CREAM | VAGINAL | 0 refills | Status: DC

## 2017-08-20 NOTE — Progress Notes (Signed)
Requested refill of Estrace.  Reordered one tube with no refills.  Altered instructions to use vaginally twice a week only. Has an upcoming appointment.  Nolene BernheimERRI Purcell Jungbluth, RN, MSN, NP-BC Nurse Practitioner, [facility] for [facility], [facility] 08/20/2017 1:33 PM

## 2017-08-20 NOTE — Progress Notes (Signed)
37F here for f/u of R perineal abscess.  S/p I&D on 08/13/17 and completed 7d of augmentin today.  Reports resolution of pain and redness with improvement in the swelling.  Noticed the packing is longer than last week and she has been trimming and even longer today.     Patient Active Problem List:     BMI 35.0-35.9,adult     Family history of diabetes mellitus     Peripheral retinal degeneration, lattice     Low grade squamous intraepith lesion on cytologic smear cervix (lgsil)         Review of Systems   Constitutional: Negative for fever.   Gastrointestinal: Negative for diarrhea.   Genitourinary: Negative for dysuria.        Denies any vaginal DC or itching       Physical Exam   Constitutional: She appears well-developed and well-nourished.   BP 110/64 (Site: LA, Position: Sitting, Cuff Size: Reg)  Pulse 92  Temp 97 F (36.1 C) (Temporal)  Wt 84.8 kg (187 lb)  LMP 08/08/2017  SpO2 100%  BMI 32.1 kg/m2   Genitourinary:   Genitourinary Comments: Resolved erythema. Packing of up to 1.5cm in length.  Residual pocket <0.5cm in depth.  Left to heal via 2' intention       ASSESSMENT/PLAN  (L02.215) Perineal abscess  (primary encounter diagnosis)  Comment: Mostly resolved symptoms with significant improvement in the residual pocket.  Will leave to heal under 2' intention.

## 2017-08-21 ENCOUNTER — Ambulatory Visit (INDEPENDENT_AMBULATORY_CARE_PROVIDER_SITE_OTHER): Payer: Medicaid Other | Admitting: Student

## 2017-08-21 ENCOUNTER — Other Ambulatory Visit: Payer: Self-pay

## 2017-08-21 ENCOUNTER — Ambulatory Visit (INDEPENDENT_AMBULATORY_CARE_PROVIDER_SITE_OTHER): Payer: Medicaid Other | Admitting: Clinical

## 2017-08-21 ENCOUNTER — Encounter: Payer: Self-pay | Admitting: Student

## 2017-08-21 ENCOUNTER — Ambulatory Visit (INDEPENDENT_AMBULATORY_CARE_PROVIDER_SITE_OTHER): Payer: Self-pay

## 2017-08-21 ENCOUNTER — Other Ambulatory Visit (HOSPITAL_COMMUNITY)
Admission: RE | Admit: 2017-08-21 | Discharge: 2017-08-21 | Disposition: A | Discharge status: Home / Self Care | Payer: Medicaid Other | Source: Ambulatory Visit | Attending: Family Medicine | Admitting: Family Medicine

## 2017-08-21 VITALS — BP 113/97 | HR 77 | Temp 98.2°F

## 2017-08-21 DIAGNOSIS — F4323 Adjustment disorder with mixed anxiety and depressed mood: Secondary | ICD-10-CM | POA: Diagnosis not present

## 2017-08-21 DIAGNOSIS — N898 Other specified noninflammatory disorders of vagina: Secondary | ICD-10-CM | POA: Insufficient documentation

## 2017-08-21 DIAGNOSIS — B9689 Other specified bacterial agents as the cause of diseases classified elsewhere: Secondary | ICD-10-CM | POA: Diagnosis not present

## 2017-08-21 NOTE — Patient Instructions (Signed)
Please review your mychart for results.

## 2017-08-21 NOTE — BH Specialist Note (Signed)
Integrated Behavioral Health Follow Up Visit  MRN: [id number] Name: Emily Levine  Number of Integrated Behavioral Health Clinician visits: 3/6 Session Start time: 1:20  Session End time: 1:52 Total time: 30 minutes  Type of Service: Integrated Behavioral Health- Individual/Family Interpretor:No. Interpretor Name and Language: n/a  SUBJECTIVE: Emily Levine is a 22 y.o. female accompanied by n/a Patient was referred by Judeth HornErin Lawrence, NP for depression and anxiety Patient reports the following symptoms/concerns: Pt states her primary concern today is insufficient sleep, low energy, lack of appetite, uncertainty and worry over not knowing why baby is crying; worry over developing postpartum depression. Pt would feel better knowing she is on medication to prevent increase in depression and worry.   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  Reduce symptoms of: anxiety and depression  2.  Increase knowledge and/or ability of: healthy habits  3.  Demonstrate ability to: Increase healthy adjustment to current life circumstances  INTERVENTIONS: Interventions utilized:  Solution-Focused Strategies Standardized Assessments completed: GAD-7 and PHQ 9  ASSESSMENT: Patient currently experiencing Adjustment disorder with mixed anxious and depressed mood.   Patient may benefit from continued brief therapeutic interventions regarding coping with symptoms of anxiety and depression.  PLAN: 1. Follow up with behavioral health clinician on :  Postpartum visit 2. Behavioral recommendations:  -Make sleep a priority for the next two weeks. Sleep when baby sleeps.  -Begin taking Zoloft, as prescribed by medical provider today 3. Referral(s): Integrated Behavioral Health Services (In Clinic) 4. "From scale of 1-10, how likely are you to follow plan?": 9  Valetta CloseJamie C OglesbyMcMannes, LCSW   Edinburgh Postnatal Depression Scale - 08/08/17 1304      Edinburgh Postnatal Depression Scale:  In the Past 7 Days   I have been able to laugh and see the funny side of things.  0    I have looked forward with enjoyment to things.  0    I have blamed myself unnecessarily when things went wrong.  1    I have been anxious or worried for no good reason.  2    I have felt scared or panicky for no good reason.  0    Things have been getting on top of me.  1    I have been so unhappy that I have had difficulty sleeping.  0    I have felt sad or miserable.  0    I have been so unhappy that I have been crying.  0    The thought of harming myself has occurred to me.  0    Edinburgh Postnatal Depression Scale Total  4      Depression screen [facility] 2/[facility] 08/21/2017 04/14/2017 03/21/2017 02/10/2017  Decreased Interest 1 2 2 3   Down, Depressed, Hopeless 2 2 2 2   PHQ - 2 Score 3 4 4 5   Altered sleeping 3 0 2 3  Tired, decreased energy 3 3 2 3   Change in appetite 3 0 2 3  Feeling bad or failure about yourself  3 2 2 2   Trouble concentrating 2 0 3  1  Moving slowly or fidgety/restless 2 0 1 2  Suicidal thoughts 0 2 0 1  PHQ-9 Score 19 11 16 20    GAD 7 : Generalized Anxiety Score 08/21/2017 04/14/2017 03/21/2017 02/10/2017  Nervous, Anxious, on Edge 0 2 2 2   Control/stop worrying 2 2 2 2   Worry too much - different things 3 2 2 2   Trouble relaxing 0 2 0 1  Restless 2 2 0 0  Easily annoyed or irritable 2 2 0 1  Afraid - awful might happen 0 2 0 1  Total GAD 7 Score 9 14 6  9

## 2017-08-21 NOTE — Progress Notes (Signed)
History:  Emily Levine is a 22 y.o. G1P0 who presents to clinic today for vaginal discharge. Pt is 3 wks s/p SVD. Had 1st degree perineal/sulcal laceration. Was seen in [facility]U 2 weeks ago for vaginal pain. Some of the sutures had some apart. Pt d/c'd home on vaginal estrogen. Has noticed some vaginal discharge recently & concerned that it may be due to infection of her laceration. Reports continued discomfort with her laceration but much improved since MAU visit. Has not been taking vaginal estrogen b/c her insurance didn't cover the prescription.  Has not been sexually active since delivery.  No vaginal bleeding, vaginal odor, fever/chills, or vaginal itching.     Patient Active Problem List   Diagnosis Date Noted  . Post-dates pregnancy 08/03/2017  . Status post vacuum-assisted vaginal delivery 08/03/2017  . 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 LGSIL on Pap smear of cervix 01/20/2017  . Supervision of normal first pregnancy, antepartum 01/13/2017    No Known Allergies  Current Outpatient Medications on File Prior to Visit  Medication Sig Dispense Refill  . acetaminophen (TYLENOL) 325 MG tablet Take 2 tablets (650 mg total) by mouth every 4 (four) hours as needed (for pain scale < 4).    . coconut oil OIL Apply 1 application topically as needed.  0  . dibucaine (NUPERCAINAL) 1 % OINT Place 1 application rectally as needed for hemorrhoids.  0  . estradiol (ESTRACE) 0.1 MG/GM vaginal cream 1 gm at bedtime TWICE A WEEK for 2 weeks 42.5 g 0  . ibuprofen (ADVIL,MOTRIN) 600 MG tablet Take 1 tablet (600 mg total) by mouth every 6 (six) hours. 30 tablet 0  . Prenatal Vit-Fe Fumarate-FA (MULTIVITAMIN-PRENATAL) 27-0.8 MG TABS tablet Take 1 tablet by mouth daily at 12 noon. 30 each 9  . senna-docusate (SENOKOT-S) 8.6-50 MG tablet Take 2 tablets by mouth daily. 60 tablet 0  . witch hazel-glycerin (TUCKS) pad Apply 1 application topically as needed for hemorrhoids. 40 each 12    No current facility-administered medications on file prior to visit.      The following portions of the patient's history were reviewed and updated as appropriate: allergies, current medications, family history, past medical history, social history, past surgical history and problem list.  Review of Systems:  Other than those mentioned in HPI all ROS negative   Objective:  Physical Exam BP (!) 113/97   Pulse 77   Temp 98.2 F (36.8 C) (Oral)  CONSTITUTIONAL: Well-developed, well-nourished female in no acute distress.  EYES: EOM intact, conjunctivae normal, no scleral icterus HEAD: Normocephalic, atraumatic RESPIRATORY: Effort and breath sounds normal, no problems with respiration noted. GENITOURINARY: right perineal lac --- pink tissue, appears well healing. Evaluated by Dr. Vergie LivingPickens.  SKIN: Skin is warm and dry. No rash noted. Not diaphoretic. No erythema. No pallor. NEUROLGIC: Alert and oriented to person, place, and time. Normal reflexes, muscle tone, coordination. No cranial nerve deficit noted.  Labs and Imaging GC/CT & wet prep collected  Assessment & Plan:  Assessment: 1. Vaginal discharge -GC/CT & wet prep pending  2. Obstetric vaginal laceration with first degree perineal laceration -Dr. Vergie LivingPickens called to bedside to evaluated pt's laceration as he previously evaluated it. Appears to be well healing and improved from MAU visit. He would like to see her for her PP visit in 2-3 wks.   Judeth HornLawrence, Tyrus Wilms, NP 08/21/2017 3:49 PM

## 2017-08-21 NOTE — Patient Instructions (Signed)
Care of a Perineal Tear °A perineal tear is a cut (laceration) in the tissue between the opening of the vagina and the anus (perineum). Some women naturally develop a perineal tear during a vaginal birth. This can happen as the baby emerges from the birth canal and the perineum is stretched. Perineal tears are graded based on how deep and long the laceration is. The grading for perineal tears is as follows: °· First degree. This involves a shallow tear at the edge of the vaginal opening that extends slightly into the perineal skin. °· Second degree. This involves tearing described in a first degree perineal tear and also a deeper tear of the vaginal opening and perineal tissues. It may also include tearing of a muscle just under the perineal skin. °· Third degree. This involves tearing described in a first and second degree perineal tear, with the tear extending into the muscle of the anus (anal sphincter). °· Fourth degree. This involves all levels of tear described for first, second, and third degree perineal tear, with the tear extending into the rectum. ° °First degree perineal tears may or may not be stitched closed, depending on their location and appearance. Second, third, and fourth degree perineal tears are stitched closed immediately after the baby’s birth. °What are the risks? °Depending on the type of perineal tear you have, you may be at risk for the following: °· Bleeding. °· Developing a collection of blood in the perineal tear area (hematoma). °· Pain. This may include pain with urination or bowel movements. °· Infection at the site of the tear. °· Fever. °· Trouble controlling your bowels (fecal incontinence). °· Painful sexual intercourse. ° °How to care for a perineal tear °· The first day, put ice on the area of the tear. °? Put ice in a plastic bag. °? Place a towel between your skin and the bag. °? Leave the ice on for 20 minutes, 2-3 times a day. °· Bathe using a warm sitz bath as directed by  your health care provider. This can speed up healing. Sitz baths can be performed in your bathtub or using a sitz bath kit that fits over your toilet. °? Place 3-4 in. (7.6-10 cm) of warm water in your bathtub or fill the sitz bath over-the-toilet container with warm water. Make sure the water is not too hot by placing a drop on your wrist. °? Sit in the warm water for 20-30 minutes. °? After bathing, pat your perineum dry with a clean towel. Do not scrub the perineum as this could cause pain, irritation, or open any stitches you may have. °? Keep the over-the-toilet sitz bath container clean by rinsing it thoroughly after each use. Ask for help in keeping the bathtub clean with diluted bleach and water (2 Tbsp [30 mL] of bleach to ½ gal [1.9 L] of water). °? Repeat the sitz bath as often as you would like to relieve perineal pain, itching, or discomfort. °· Apply a numbing spray to the perineal tear site as directed by your health care provider. This may help with discomfort. °· Wash your hands before and after applying medicine to the area. °· Put about 3 witch hazel-containing hemorrhoid treatment pads on top of your sanitary pad. The witch hazel in the hemorrhoid pads helps with discomfort and swelling. °· Get a squeeze bottle to squeeze warm water on your perineum when urinating, spraying the area from front to back. Pat the area to dry it. °· Sitting on an inflatable   ring or pillow may provide comfort.  Take medicines only as directed by your health care provider.  Do not have sexual intercourse or use tampons until your health care provider says it is okay. Typically, you must wait at least 6 weeks.  Keep all postpartum appointments as directed by your health care provider. Contact a health care provider if:  Your pain is not relieved with medicines.  You have painful urination.  You have a fever. Get help right away if:  You have redness, swelling, or increasing pain in the area of the  tear.  You have pus coming from the area of the tear.  You notice a bad smell coming from the area of the tear.  Your tear opens.  You notice swelling in the area of the tear that is larger than when you left the hospital.  You cannot urinate. This information is not intended to replace advice given to you by your health care provider. Make sure you discuss any questions you have with your health care provider. Document Released: 07/12/2013 Document Revised: 08/09/2015 Document Reviewed: 12/01/2012 Elsevier Interactive Patient Education  2017 [employer].

## 2017-08-21 NOTE — Progress Notes (Signed)
Patient here today complaining of vaginal discharge.  Self swab done and sent for evaluation.  Advised patient we would contact her with results.  She is also going to be seen by Estanislado SpireE Lawrence for vaginal tear.

## 2017-08-22 LAB — CERVICOVAGINAL ANCILLARY ONLY
BACTERIAL VAGINITIS: POSITIVE — AB
CANDIDA VAGINITIS: NEGATIVE
CHLAMYDIA, DNA PROBE: NEGATIVE
Neisseria Gonorrhea: NEGATIVE
TRICH (WINDOWPATH): NEGATIVE

## 2017-08-25 MED ORDER — CLINDAMYCIN PHOSPHATE 2 % VA CREA: 1.0000 | TOPICAL_CREAM | Freq: Every day | VAGINAL | 0 refills | Status: DC

## 2017-08-25 NOTE — Addendum Note (Signed)
Addended by: Reva BoresPRATT, Melady Chow S on: 08/25/2017 08:49 AM   Modules accepted: Orders

## 2017-08-27 ENCOUNTER — Encounter: Payer: Self-pay | Admitting: Advanced Practice Midwife

## 2017-09-03 ENCOUNTER — Encounter: Payer: Self-pay | Admitting: Advanced Practice Midwife

## 2017-09-03 ENCOUNTER — Other Ambulatory Visit: Payer: Self-pay | Admitting: *Deleted

## 2017-09-03 ENCOUNTER — Telehealth: Payer: Self-pay | Admitting: *Deleted

## 2017-09-03 DIAGNOSIS — F4323 Adjustment disorder with mixed anxiety and depressed mood: Secondary | ICD-10-CM

## 2017-09-03 DIAGNOSIS — B9689 Other specified bacterial agents as the cause of diseases classified elsewhere: Secondary | ICD-10-CM

## 2017-09-03 DIAGNOSIS — N760 Acute vaginitis: Secondary | ICD-10-CM

## 2017-09-03 DIAGNOSIS — F329 Major depressive disorder, single episode, unspecified: Secondary | ICD-10-CM

## 2017-09-03 DIAGNOSIS — O99340 Other mental disorders complicating pregnancy, unspecified trimester: Secondary | ICD-10-CM

## 2017-09-03 MED ORDER — SERTRALINE HCL 25 MG PO TABS
25.0000 mg | ORAL_TABLET | Freq: Every day | ORAL | 0 refills | Status: DC
Start: 2017-09-03 — End: 2017-12-10

## 2017-09-03 NOTE — Telephone Encounter (Signed)
Received MyChart message from BurkeAmanda requesting zoloft and that she felt depressed and was in [place]. Reviewed with Asher MuirJamie, [facility] from [facility] note with patient zoloft recommended at her last visit but not ordered . Discussed with Vonzella NippleJulie Wenzel, PA and order for one month of zoloft approved. I called Marchelle Folksmanda and informed her we got her message and I sent rx to her pharmacy in [place]. I asked if she was having thoughts of hurting herself or baby- she denies this. States she just feels really sad.  We discussed it takes a while for the zoloft to start helping and if she feels worse or like she wants to hurt herself or baby to go to nearest ER. We also discussed that she has a follow up [facility] appt this week and postpartum appt next week. She states she  Doesn't think she will make them. I encouraged her to come or to reschedule them. I explained the rx is only for 1 month and she will need to be seen by a provider here or there to determine if it is helping or if dosage needs to be adjusted or changed to other med. She voices understanding.

## 2017-09-04 MED ORDER — CLINDAMYCIN PHOSPHATE 2 % VA CREA: 1.0000 | TOPICAL_CREAM | Freq: Every day | VAGINAL | 0 refills | Status: DC

## 2017-09-05 ENCOUNTER — Ambulatory Visit: Payer: Self-pay

## 2017-09-08 ENCOUNTER — Ambulatory Visit: Payer: Self-pay | Admitting: Advanced Practice Midwife

## 2017-09-10 ENCOUNTER — Encounter: Payer: Self-pay | Admitting: Obstetrics and Gynecology

## 2017-09-10 ENCOUNTER — Ambulatory Visit: Payer: Self-pay | Admitting: Obstetrics and Gynecology

## 2017-09-12 NOTE — Progress Notes (Signed)
Patient did not keep postpartum appointment for 09/10/2017.  Emily Levine, Jr MD Attending [facility](Faculty Practice)

## 2017-09-30 ENCOUNTER — Ambulatory Visit: Payer: BC Managed Care – PPO | Attending: Family Medicine | Admitting: Family Medicine

## 2017-09-30 ENCOUNTER — Telehealth (HOSPITAL_BASED_OUTPATIENT_CLINIC_OR_DEPARTMENT_OTHER): Payer: Self-pay

## 2017-09-30 ENCOUNTER — Encounter (HOSPITAL_BASED_OUTPATIENT_CLINIC_OR_DEPARTMENT_OTHER): Payer: Self-pay | Admitting: Family Medicine

## 2017-09-30 VITALS — BP 100/66 | HR 80 | Temp 97.6°F | Ht 64.25 in | Wt 187.0 lb

## 2017-09-30 DIAGNOSIS — H35413 Lattice degeneration of retina, bilateral: Secondary | ICD-10-CM | POA: Diagnosis not present

## 2017-09-30 DIAGNOSIS — Z124 Encounter for screening for malignant neoplasm of cervix: Secondary | ICD-10-CM | POA: Diagnosis not present

## 2017-09-30 DIAGNOSIS — Z0000 Encounter for general adult medical examination without abnormal findings: Secondary | ICD-10-CM | POA: Insufficient documentation

## 2017-09-30 DIAGNOSIS — R87612 Low grade squamous intraepithelial lesion on cytologic smear of cervix (LGSIL): Secondary | ICD-10-CM | POA: Diagnosis present

## 2017-09-30 DIAGNOSIS — Z23 Encounter for immunization: Secondary | ICD-10-CM | POA: Diagnosis not present

## 2017-09-30 NOTE — Progress Notes (Signed)
Laura from [facility] called the [facility] to complete a benefit analysis for the TDAP Vaccine.     The vaccine is covered under the patient’s prescription coverage.     Please choose Prior Auth

## 2017-09-30 NOTE — Progress Notes (Signed)
Chief Complaint:  Dawn Merritt is a 22 year old female who presents for a physical exam.  Patient has other current concerns of Since June 2 abscesses in perineal area - first required I and D, second used hot compresses and went away.  Otherwise feeling well.  Happy about weight loss.    I have reviewed the patient's medical history in detail and updated the computerized patient record.  Patient Active Problem List    Low grade squamous intraepith lesion on cytologic smear cervix (lgsil)         Date Noted: 10/16/2015            Age 50, first pap - plan repeat cytology 12 months            09/2016 pap ascus/h and lsil, will need colpo            CERVICAL BIOPSY 12:00:             -SQUAMOUS ATYPIA.             -TRANSFORMATION ZONE IS PRESENT.             MULTIPLE LEVELS EXAMINED.                          CERVICAL BIOPSY 9:00:             -NEGATIVE FOR DYSPLASIA.             -TRANSFORMATION ZONE IS PRESENT.                          - ENDOCERVICAL CURETTAGE:             -FRAGMENTS OF UNREMARKABLE ENDOCERVICAL            EPITHELIUM. Plan repeat cotesting 12 mos.      Peripheral retinal degeneration, lattice         Date Noted: 10/03/2008            Noted by optometrist, confirmed by ophthalmologist            Dr. Ayesha Rumpf, [facility] -             referred to retinal specialist            No holes/tears 6/16 ([place])            2019 sees doc at [facility]      BMI 35.0-35.9,adult         Date Noted: 11/14/2004      Family history of diabetes mellitus         Date Noted: 11/14/2004        No current outpatient medications on file prior to visit.  No current facility-administered medications on file prior to visit.   Allergies: Patient has no known allergies.  Immunization History   Administered Date(s) Administered    DTP 07/19/1994, 09/27/1994, 11/20/1994, 11/03/1995, 04/27/1999    HIB 4 Dose Schedule (PRP-T) 07/19/1994, 09/27/1994, 11/20/1994, 07/24/1995    HPV-4 (3 DOSE) 02/26/2007, 04/29/2007, 08/31/2007    Hep B  Pedi/Adol 3 Dose Less than age 6 08/08/1994, 06/06/1994, 02/03/1995    INFLUENZA VIRUS TRI W/PRESV VACCINE 18/> YRS IM (PRIVATE) 02/06/2009    MMR 07/24/1995, 04/27/1999    Menactra (Meningococcal) 02/06/2006, 05/14/2012    OPV 07/19/1994, 09/27/1994, 11/20/1994, 04/27/1999    PPD 02/03/1995    Td 09/30/2017    Tdap 05/12/2006  Varivax (chicken pox vaccine) 12/09/1996, 02/26/2007     No past medical history on file.  Past Surgical History:  No date: NO SIGNIFICANT SURGICAL HISTORY  Social History     Socioeconomic History    Marital status: Single     Spouse name: Not on file    Number of children: Not on file    Years of education: Not on file    Highest education level: Not on file   Occupational History    Not on file   Social Needs    Financial resource strain: Not on file    Food insecurity:     Worry: Not on file     Inability: Not on file    Transportation needs:     Medical: Not on file     Non-medical: Not on file   Tobacco Use    Smoking status: Passive Smoke Exposure - Never Smoker    Smokeless tobacco: Never Used    Tobacco comment: dad smoked in house   Substance and Sexual Activity    Alcohol use: Yes     Comment: 1-2 weekly    Drug use: No    Sexual activity: Not Currently     Partners: Male     Birth control/protection: Condom   Lifestyle    Physical activity:     Days per week: Not on file     Minutes per session: Not on file    Stress: Not on file   Relationships    Social connections:     Talks on phone: Not on file     Gets together: Not on file     Attends religious service: Not on file     Active member of club or organization: Not on file     Attends meetings of clubs or organizations: Not on file     Relationship status: Not on file    Intimate partner violence:     Fear of current or ex partner: Not on file     Emotionally abused: Not on file     Physically abused: Not on file     Forced sexual activity: Not on file   Other Topics Concern    Not on file   Social  History Narrative    08/2014 lives with [place] in [place].    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, working as Education administrator asst for [employer]      Review of patient's family history indicates:  Problem: Diabetes      Relation: Maternal Grandmother          Age of Onset: (Not Specified)          Comment: and numerous other fmaily members/  Problem: Asthma      Relation: Paternal Uncle          Age of Onset: (Not Specified)  Problem: Asthma      Relation: Father          Age of Onset: (Not Specified)          Comment: very mild  Problem: Heart      Relation: Maternal Grandmother          Age of Onset: (Not Specified)          Comment: CAD, CHF  Problem: Cancer - Other      Relation: Maternal Grandmother          Age of Onset: (Not Specified)          Comment:  renal  Problem: Alcohol/Drug Abuse      Relation: Maternal Grandfather          Age of Onset: (Not Specified)          Comment: alocholism  Problem: Cancer - Breast      Relation: FamHxNeg          Age of Onset: (Not Specified)  Problem: Cancer - Colon      Relation: FamHxNeg          Age of Onset: (Not Specified)  Problem: Cancer - Ovarian      Relation: FamHxNeg          Age of Onset: (Not Specified)      Review of Systems:                   Skin: per hpi  Eyes: negative  Ears/Nose/Throat: negative  Respiratory: negative  Cardiovascular: negative  Gastrointestinal: negative  Genitourinary: negative  UJW:JXBJYN menses, no abnormal bleeding, pelvic pain or discharge,no breast pain or new or enlarging lumps on self exam  Musculoskeletal: negative  Neurologic: negative  Endocrine: negative  Psychiatric: negative  Hematologic/Lymphatic/Immunologic: negative    Physical:  BP 100/66 (Site: LA, Position: Sitting, Cuff Size: Reg)  Pulse 80  Temp 97.6 F (36.4 C) (Temporal)  Ht 5' 4.25" (1.632 m)  Wt 84.8 kg (187 lb)  LMP 09/08/2017 (Exact Date)  SpO2 100%  BMI 31.85 kg/m2  General appearance: healthy, alert, well developed, well nourished  Eyes: negative  Skin: skin  color, texture, turgor are normal  Head: Normocephalic. No masses, lesions, tenderness or abnormalities  Ears: External ears normal. Canals clear. TM's normal.  Nose/Sinuses: negative  Oropharynx: Lips, mucosa, and tongue normal. Teeth and gums normal. Oropharynx moist and without lesion  Neck: Neck supple. No adenopathy. Thyroid symmetric, normal size, and without nodularity  Back: Back symmetric, no curvature. ROM normal. No CVA tenderness.  Lungs: negative findings: chest symmetric with normal A/P diameter, no chest deformities noted, normal respiratory rate and rhythm, lungs clear to auscultation  Heart: negative findings: regular rate and rhythm, S1 normal, S2 normal, no murmurs, clicks, or gallops  Abdomen: Abdomen soft, non-tender. BS normal. No masses, no organomegaly  Extremities: Extremities normal. No deformities, edema, or skin discoloration  Musculoskeletal: Spine ROM normal. Muscular strength intact.  Peripheral pulses: negative  Neuro: mental status intact, cranial nerves 2-12 intact, muscle tone normal, normal gait   Pelvic: negative findings: external genitalia normal, Bartholin's glands, urethra, Skene's glands negative, vaginal mucosa normal, cervix clear, no CMT  Rectal: deferred    Health Counseling:  Smoking:  Reviewed and Discussed  Substance Use Issues:   Reviewed and Discussed   Diet, Exercise, Wt. Control:  Reviewed and Discussed  Dental Health:  Reviewed and Discussed  Vision Health:  Reviewed and Discussed  Mental Health:  Reviewed and Discussed  Domestic Violence:  Reviewed and Discussed  Sexual Health:  Reviewed and Discussed  Osteoporosis:         ASSESSMENT/PLAN:  (Z00.00) Routine general medical examination at a health care facility  (primary encounter diagnosis)  Comment: Due for routine well exam and age and risk based screening and counseling   Plan: AMPLIFIED GENPROBE CHLAM/GC, CYTOPATH, C/V,         THIN LAYER, OBTAINING SCREEN PAP SMEAR, HUMAN         PAPILLOMAVIRUS (HPV)             (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  Plan:     (R87.612) Low grade squamous intraepith lesion on cytologic smear cervix (lgsil)  Comment: reviewed results from last year, await cotesting results today  Plan: repeat colpo for any other abnormality.    (Z23) Need for Tdap vaccination  Comment: indicated  Plan: IMMUNIZATION ADMIN SINGLE, RN, CANCELED: TDAP         VACCINE 7 AND OLDER IM            (Z23) Need for prophylactic vaccination with tetanus-diphtheria (Td)  Comment:   Plan: IMMUNIZATION ADMIN SINGLE, RN, TD VACCINE PRSRV        FREE 7 YRS OR OLDER FOR IM USE            We discussed the patient's diagnoses and the importance of medication compliance. The patient was ready to learn and no apparent learning barriers were identified. I explained the diagnosis and treatment plan, and the patient expressed understanding of the content. Possible side effects of the prescribed medication(s) were explained, including (none) .  I attempted to answer any questions regarding the diagnosis and the proposed treatment.

## 2017-09-30 NOTE — Progress Notes (Addendum)
TC to pt;   Re: Vaccine order  Explained to pt; Tdap was ordered to administed yesterday per Dr. Sabra Heck, this writer administered Td booster by error. Made aware she will need to receive Tdap next year per visit. Apologized to pt, very accepting of apology.  09/30/2017  VIS given prior to administration and reviewed with the patient and or legal guardian. Patient understands the disease and the vaccine. See immunization/Injection module or chart review for date of publication and additional information.  Tressie Ellis, LPN

## 2017-09-30 NOTE — Telephone Encounter (Signed)
-----   Message from Eda Paschal sent at 09/30/2017  1:39 PM EDT -----  To    Whom it may cocnern,    Please can you do a Benefit Analysis for a Tdap and sent message to RN pool.    Thank you,    Eda Paschal

## 2017-10-01 ENCOUNTER — Telehealth (HOSPITAL_BASED_OUTPATIENT_CLINIC_OR_DEPARTMENT_OTHER): Payer: Self-pay | Admitting: Registered Nurse

## 2017-10-01 LAB — CHLAMYDIA GC NAAT
GENPROBE CHLAMYDIA: NEGATIVE
GENPROBE GC: NEGATIVE

## 2017-10-01 LAB — HUMAN PAPILLOMAVIRUS (HPV): HUMAN PAPILLOMAVIRUS: NEGATIVE

## 2017-10-01 NOTE — Progress Notes (Signed)
Spoke to pts dad who states pt received the wrong medication and now her injection site is sore and tender.    Informed dad that pt did receive TD instead of TDaP and gave detailed explanation of each vaccine, did offer a provider visit for evaluation of injection site in AM - dad declined.    Encouraged dad to let pt know that she can elevate area, apply cold compress and take OTC analgesic to manage pain, and to call the clinic if symptoms got worse.

## 2017-10-01 NOTE — Telephone Encounter (Signed)
-----   Message from Annette Stable sent at 10/01/2017  4:45 PM EDT -----  Regarding: father is requesting an urgent phone call from nurse  Contact: [phone]  Dawn Merritt [id number], 22 year old, female    Calls today:  Patient's father is requesting an urgent phone call states that her daughter was here yesterday and was received a vaccine and her arm Is very swollen to the point that she cannot even move it .  Person calling on behalf of patient: father     Rod Can NUMBER: [phone]  Best time to call back: any   Cell phone:   Other phone:    Patient's language of care: English    Patient does not need an interpreter.    Patient's PCP: [name] MD

## 2017-10-02 ENCOUNTER — Ambulatory Visit (HOSPITAL_BASED_OUTPATIENT_CLINIC_OR_DEPARTMENT_OTHER): Payer: BC Managed Care – PPO | Admitting: Family Medicine

## 2017-10-06 ENCOUNTER — Ambulatory Visit (HOSPITAL_BASED_OUTPATIENT_CLINIC_OR_DEPARTMENT_OTHER): Payer: PRIVATE HEALTH INSURANCE | Admitting: Family Medicine

## 2017-10-07 ENCOUNTER — Encounter (HOSPITAL_BASED_OUTPATIENT_CLINIC_OR_DEPARTMENT_OTHER): Payer: Self-pay | Admitting: Family Medicine

## 2017-10-07 ENCOUNTER — Telehealth (HOSPITAL_BASED_OUTPATIENT_CLINIC_OR_DEPARTMENT_OTHER): Payer: Self-pay | Admitting: Family Medicine

## 2017-10-07 DIAGNOSIS — R87612 Low grade squamous intraepithelial lesion on cytologic smear of cervix (LGSIL): Secondary | ICD-10-CM

## 2017-10-07 LAB — CYTOPATH, C/V, THIN LAYER

## 2017-10-07 NOTE — Progress Notes (Signed)
Called to discuss abnormal pap with patient.   Was unable to reach her and left voicemail asking her to call back.   Pap smear results show ASCUS- can't rule out HSIL. She has a history of abnormal pap smears and has had a colposcopy before. Given this test results, a repeat colposcopy is indicated. It seems Dr. Sabra Heck has already discussed this possibility with her already. Did not leave a message with the above regarding the results.   If she calls black please relay this and help her schedule a colposcopy, preferably with Dr. Sabra Heck.   Per ASCCP guidelines should have observation with colposcopy and cytology every 6 months for up to 2 years. Will defer to PCP  Nickolas Madrid, 10/07/17, 1:36 PM

## 2017-10-07 NOTE — Progress Notes (Signed)
Patient called back. I spoke with her regarding test results. She will call and schedule colposcopy with Dr. Sabra Heck.   Nickolas Madrid, MD, 10/07/17, 5:59 PM

## 2017-11-06 ENCOUNTER — Ambulatory Visit (HOSPITAL_BASED_OUTPATIENT_CLINIC_OR_DEPARTMENT_OTHER): Payer: BC Managed Care – PPO | Admitting: Family Medicine

## 2017-11-20 ENCOUNTER — Ambulatory Visit: Payer: BC Managed Care – PPO | Attending: Family Medicine | Admitting: Family Medicine

## 2017-11-20 ENCOUNTER — Encounter (HOSPITAL_BASED_OUTPATIENT_CLINIC_OR_DEPARTMENT_OTHER): Payer: Self-pay | Admitting: Family Medicine

## 2017-11-20 VITALS — BP 110/66 | Wt 186.0 lb

## 2017-11-20 DIAGNOSIS — Z113 Encounter for screening for infections with a predominantly sexual mode of transmission: Secondary | ICD-10-CM | POA: Insufficient documentation

## 2017-11-20 DIAGNOSIS — R87611 Atypical squamous cells cannot exclude high grade squamous intraepithelial lesion on cytologic smear of cervix (ASC-H): Secondary | ICD-10-CM | POA: Diagnosis present

## 2017-11-20 LAB — URINE PREGNANCY TEST (POINT OF CARE): HCG QUALITATIVE URINE: NEGATIVE

## 2017-11-20 MED ORDER — IBUPROFEN 600 MG PO TABS
600.00 mg | ORAL_TABLET | Freq: Once | ORAL | Status: AC
Start: 2017-11-20 — End: ?

## 2017-11-20 NOTE — Progress Notes (Signed)
Physical Exam   Genitourinary:

## 2017-11-20 NOTE — Progress Notes (Signed)
PATIENT/PROCEDURE VERIFICATION DOCUMENTATION    Correct patient: Yes  Correct procedure: Yes  Correct side, site, mark visible if applicable: Yes  Correct position: Yes  Special equipment/implant(s) present, if applicable: Yes    Time-out completed, documented by provider doing procedure or designated team member:  [name], MD    11/20/2017    41:73 AM  22 year old female presents for colposcopy  Patient's last menstrual period was 11/13/2017.     PCP: [name], MD    Patient pregnant? No    Indication for colposcopy:ASCUS-H again, HPV neg    Prior history: see problem list, second ASCUS H      Details of the procedure, indications and risks were discussed with the patient, questions answered and written consent form signed.    PROCEDURE:  The cervix was visualized and swabbed with 3% acetic acid.  The transformation zone was completely visualized and appeared normal.    Cervix: no visible lesions, no mosaicism, no punctation, no abnormal vasculature and see Annotated Image    Pap smear: Not obtained    ECC: Done    Biopsy: Done at 5 o'clock    CLINICAL IMPRESSION: Normal exam without visible pathology  Examination was satisfactory    PATIENT EDUCATION:  The following were discussed, or previous discussion reinforced:   the short and long term course, follow-up, and treatment of cervical dysplasia   the epidemiology of HPV infection   the availability of vaccination   the vital importance of continued surveillance, per protocol    POST PAIN ASSESSMENT:  Post pain assessment done. Patient rates pain as a 0 on a 0-10 pain scale.    Estimated blood loss: 2cc    DISPOSITION:  the patient will be contacted with results.

## 2017-11-21 LAB — HIV ANTIGEN ANTIBODY 5TH GEN: HIV 1/2 PLUS O AG/AB SCREEN: 0.44 INDEX (ref 0.00–0.99)

## 2017-11-21 LAB — CHLAMYDIA GC NAAT
CHLAMYDIA TRACHOMATIS NAAT: NEGATIVE
NEISSERIA GONORRHOEAE NAAT: NEGATIVE

## 2017-11-25 LAB — SURGICAL PATH SPECIMEN GYN

## 2017-11-26 ENCOUNTER — Encounter (HOSPITAL_BASED_OUTPATIENT_CLINIC_OR_DEPARTMENT_OTHER): Payer: Self-pay | Admitting: Family Medicine

## 2017-11-26 DIAGNOSIS — R87612 Low grade squamous intraepithelial lesion on cytologic smear of cervix (LGSIL): Secondary | ICD-10-CM

## 2017-12-10 ENCOUNTER — Ambulatory Visit (INDEPENDENT_AMBULATORY_CARE_PROVIDER_SITE_OTHER): Payer: Self-pay | Admitting: Nurse Practitioner

## 2017-12-10 ENCOUNTER — Encounter: Payer: Self-pay | Admitting: Nurse Practitioner

## 2017-12-10 VITALS — BP 102/68 | HR 61 | Ht 64.0 in | Wt 164.0 lb

## 2017-12-10 DIAGNOSIS — Z30431 Encounter for routine checking of intrauterine contraceptive device: Secondary | ICD-10-CM

## 2017-12-10 DIAGNOSIS — N939 Abnormal uterine and vaginal bleeding, unspecified: Secondary | ICD-10-CM

## 2017-12-10 MED ORDER — IBUPROFEN 600 MG PO TABS: 600.0000 mg | ORAL_TABLET | Freq: Four times a day (QID) | ORAL | 2 refills | Status: AC | PRN

## 2017-12-10 NOTE — Progress Notes (Signed)
Pt states 1 week ago she started spotting then 3 days ago she started bleeding heavily.

## 2017-12-10 NOTE — Progress Notes (Signed)
GYNECOLOGY OFFICE VISIT NOTE   History:  22 y.o. G1P0 here today for IUD check.  She did not come here for a postpartum visit.  She was in [place] and had a visit there and Paragard IUD inserted.  Her partner has had a "poking" pain with intercourse and she wants the IUD strings cut. Partner has not had any cuts to the penis. She has been having spotting for one week and then heavy vaginal bleeding for 3 days.  Is having some cramping but is taking Ibuprofen 600 mg occasionally - not at a therapeutic dose.  She is worried about her continued large breasts even though she is no longer breastfeeding.  Checks her nipples by squeezing to see if breast milk is still being produced.  Is worried as she still has breast milk.  Considering a breast reduction.  Past Medical History:  Diagnosis Date  . Medical history non-contributory     Past Surgical History:  Procedure Laterality Date  . HERNIA MESH REMOVAL  2000  . HERNIA REPAIR    . TONSILLECTOMY      The following portions of the patient's history were reviewed and updated as appropriate: allergies, current medications, past family history, past medical history, past social history, past surgical history and problem list.   Health Maintenance: Pap needed by 01-2018 unless she had pap smear in [place] - sent a MyChart message to client after she left clinic today.  Review of Systems:  Pertinent items noted in HPI and remainder of comprehensive ROS otherwise negative.  Objective:  Physical Exam BP 102/68   Pulse 61   Ht 5\' 4"  (1.626 m)   Wt 164 lb (74.4 kg)   Breastfeeding? No   BMI 28.15 kg/m  CONSTITUTIONAL: Well-developed, well-nourished female in no acute distress.  HENT:  Normocephalic, atraumatic. External right and left ear normal.  EYES: Conjunctivae and EOM are normal. Pupils are equal, round.  No scleral icterus.  NECK: Normal range of motion, supple, no masses SKIN: Skin is warm and dry. No rash noted. Not  diaphoretic. No erythema. No pallor. NEUROLOGIC: Alert and oriented to person, place, and time. Normal muscle tone coordination. No cranial nerve deficit noted. PSYCHIATRIC: Normal mood and affect. Normal behavior. Normal judgment and thought content. ABDOMEN: Soft, no distention noted.   PELVIC: Moderate amoung of dark blood seen in vagina.  IUD strings seen - white - and trimmed 2mm from both strings with a perpendicular cut.  Still 2-3 cm of length noted on IUD strings. MUSCULOSKELETAL: Normal range of motion. No edema noted.  Labs and Imaging No results found.  Assessment & Plan:  1. Intrauterine contraceptive device, checking Paragard IUD intact - no part of IUD palpated on bimanual. Reviewed expected bleeding pattern with Paragard IUD and client was surprised.  Did not know that the copper IUD would be associated with 7 days of bleeding and cramping (ner normal menstrual pattern before her pregnancy).  Advised it may take a year to become more adjusted to the IUD.  Do not expect her partner to have any problem.  Possibly the partner is bumping into the cervix with intercourse - advised to have a slightly to one side entry rather than a straight on entry during sex and see if his problem is better.  Advised lubricant during sex until her healing is complete.  Skin is intact and healed well on right side of introitus - but still is somewhat tender on exam.  2. Vaginal bleeding To use  ibuprofen 600 mg PO q 6 hours for the first 3 days of menses  3.  Breast milk - advised no squeezing of nipples to check on milk.  Can use cabbage leaves to breasts for 24 hours.  Reduce stimulation to her breasts.  Advised to wait until finished with childbearing if she wants a breast reduction.  Routine preventative health maintenance measures emphasized. Please refer to After Visit Summary for other counseling recommendations.   Return in about 1 year (around 12/11/2018).   Total face-to-face time with  patient: 15 minutes.  Over 50% of encounter was spent on counseling and coordination of care.  Nolene Bernheim, RN, MSN, NP-BC Nurse Practitioner, [facility] for [employer], [facility] 12/10/2017 3:43 PM

## 2018-02-20 ENCOUNTER — Encounter (HOSPITAL_BASED_OUTPATIENT_CLINIC_OR_DEPARTMENT_OTHER): Payer: Self-pay | Admitting: Family Medicine

## 2018-02-20 NOTE — Progress Notes (Signed)
Nelson Chimes authorized [employer] to release/disclose Medical records to [employer], [place].  Medical release form sent to the medical records department to be processed. Via Inter[facility] Attn: HIM    Form scanned in Epic.   Deliah Hines, 02/20/2018  .

## 2018-02-24 ENCOUNTER — Telehealth (HOSPITAL_BASED_OUTPATIENT_CLINIC_OR_DEPARTMENT_OTHER): Payer: Self-pay | Admitting: Registered Nurse

## 2018-02-24 DIAGNOSIS — R87621 Atypical squamous cells cannot exclude high grade squamous intraepithelial lesion on cytologic smear of vagina (ASC-H): Secondary | ICD-10-CM | POA: Diagnosis not present

## 2018-02-24 NOTE — Telephone Encounter (Signed)
-----   Message from Lolita Lenz sent at 02/24/2018  9:18 AM EST -----  Regarding:  Calling from Dr. Maple Mirza office [facility]   -Needs last 3 pap smear results, Pt's colpo results   Contact: Gard[place] [phone], 22 year old, female    Calls today:  Pt is at the Dr.'s office and needs to be seen can not proceed with appt. Without test results [place] stated that Pt's pap smear test were abnormal. Calling from Dr. Maple Mirza office [facility]   -Needs last 3 pap smear results, Pt's colpo results   Person calling on behalf of patient: Brantley Stage NUMBER: [phone]  [phone]  Best time to call back: any    Patient's language of care: English        Patient's PCP: [name], MD

## 2018-02-24 NOTE — Progress Notes (Signed)
RN called Katrina and the record requested was faxed to [phone].  RN confirmed that the record requested was received.  [place], [place], 02/24/2018

## 2018-05-11 DIAGNOSIS — Z30012 Encounter for prescription of emergency contraception: Secondary | ICD-10-CM | POA: Diagnosis not present

## 2018-05-11 DIAGNOSIS — Z3202 Encounter for pregnancy test, result negative: Secondary | ICD-10-CM | POA: Diagnosis not present

## 2018-05-21 ENCOUNTER — Ambulatory Visit (HOSPITAL_BASED_OUTPATIENT_CLINIC_OR_DEPARTMENT_OTHER): Payer: Self-pay | Admitting: Family Medicine

## 2018-06-04 ENCOUNTER — Ambulatory Visit (HOSPITAL_BASED_OUTPATIENT_CLINIC_OR_DEPARTMENT_OTHER): Payer: Self-pay | Admitting: Family Medicine

## 2018-07-21 ENCOUNTER — Other Ambulatory Visit: Payer: Self-pay

## 2018-08-06 ENCOUNTER — Other Ambulatory Visit: Payer: Self-pay

## 2018-09-23 ENCOUNTER — Other Ambulatory Visit: Payer: Self-pay

## 2018-10-15 ENCOUNTER — Ambulatory Visit (HOSPITAL_BASED_OUTPATIENT_CLINIC_OR_DEPARTMENT_OTHER): Payer: Self-pay | Admitting: Family Medicine

## 2018-10-26 ENCOUNTER — Ambulatory Visit (HOSPITAL_BASED_OUTPATIENT_CLINIC_OR_DEPARTMENT_OTHER): Payer: Self-pay | Admitting: Family Medicine

## 2018-11-10 ENCOUNTER — Telehealth (HOSPITAL_BASED_OUTPATIENT_CLINIC_OR_DEPARTMENT_OTHER): Payer: Self-pay

## 2018-11-10 NOTE — Progress Notes (Signed)
Screening for possible COVID:    1. Does the patient have cough, shortness of breath, or runny nose, including chronic conditions? No  2. In the past 14 days, has the patient:  a. had new fever, cough, shortness of breath, body aches, runny nose, sore throat, nausea, diarrhea, red eye, or lack of smell/taste? OR  No   b. has the patient had COVID or suspected COVID?  No

## 2018-11-17 ENCOUNTER — Ambulatory Visit (HOSPITAL_BASED_OUTPATIENT_CLINIC_OR_DEPARTMENT_OTHER): Payer: Self-pay | Admitting: Family Medicine

## 2018-11-23 ENCOUNTER — Telehealth (HOSPITAL_BASED_OUTPATIENT_CLINIC_OR_DEPARTMENT_OTHER): Payer: Self-pay

## 2018-11-23 NOTE — Telephone Encounter (Signed)
-----   Message from [place] sent at 11/23/2018 12:19 PM EDT -----      ----- Message -----  From: Carney Bern, MD  Sent: 11/20/2018   6:51 PM EDT  To: Stevphen Meuse    Hi Melisa,   Do you mind calling this patient to help schedule an appt for a colpo with pap with me at [facility] 11/24/18.  They should have blocked my schedule in the morning for procedure only visits.   Thanks

## 2018-11-23 NOTE — Progress Notes (Signed)
Spoke to pt and she declined appt she stated that her menstrual period started yesterday. Pt would like to come in next week. Message sent to Dr Seymour Bars regarding scheduling this pt.    Verna Czech, [place], 11/23/2018

## 2018-12-23 ENCOUNTER — Telehealth (HOSPITAL_BASED_OUTPATIENT_CLINIC_OR_DEPARTMENT_OTHER): Payer: Self-pay

## 2018-12-23 ENCOUNTER — Other Ambulatory Visit: Payer: Self-pay

## 2018-12-23 NOTE — Progress Notes (Signed)
Screening for possible COVID:    1. In the past 14 days, has the patient had new or worsening (for chronic symptoms) fever, cough, shortness of breath, body aches, runny nose, sore throat, nausea, diarrhea, or lack of smell/taste? No   2. In the past 14 days, has the patient had COVID or suspected COVID?  No

## 2018-12-28 ENCOUNTER — Other Ambulatory Visit: Payer: Self-pay

## 2019-01-06 ENCOUNTER — Encounter (HOSPITAL_BASED_OUTPATIENT_CLINIC_OR_DEPARTMENT_OTHER): Payer: Self-pay | Admitting: Family Medicine

## 2019-01-06 ENCOUNTER — Other Ambulatory Visit: Payer: Self-pay

## 2019-01-06 ENCOUNTER — Ambulatory Visit: Payer: BC Managed Care – PPO | Attending: Family Medicine | Admitting: Family Medicine

## 2019-01-06 VITALS — BP 99/68 | HR 124 | Temp 97.0°F | Ht 64.0 in | Wt 215.0 lb

## 2019-01-06 DIAGNOSIS — R87612 Low grade squamous intraepithelial lesion on cytologic smear of cervix (LGSIL): Secondary | ICD-10-CM | POA: Diagnosis not present

## 2019-01-06 DIAGNOSIS — Z124 Encounter for screening for malignant neoplasm of cervix: Secondary | ICD-10-CM | POA: Insufficient documentation

## 2019-01-06 LAB — URINE PREGNANCY TEST (POINT OF CARE): HCG QUALITATIVE URINE: NEGATIVE

## 2019-01-06 MED ORDER — NORETHINDRONE ACET-ETHINYL EST 1-20 MG-MCG PO TABS
1.0000 | ORAL_TABLET | Freq: Every day | ORAL | 3 refills | Status: DC
Start: 2019-01-06 — End: 2019-12-02

## 2019-01-06 NOTE — Progress Notes (Signed)
55F here for repeat cervical cancer screening.  Known h/o cervical dysplasia here for routine f/u.  She is sexually active with one partner (unsure if mutually monogamous) with consistent condom use.  Reports monthly menses each episode lasting 5d.  Said she noticed (after shaving last week) a bump in her L vagina.  No associated pain or tenderness.        Patient Active Problem List:     BMI 31.0-31.9,adult     Family history of diabetes mellitus     Peripheral retinal degeneration, lattice     Low grade squamous intraepith lesion on cytologic smear cervix (lgsil)            Physical Exam  Constitutional:       Appearance: Normal appearance.      Comments: BP 99/68    Pulse 124    Temp 97 F (36.1 C) (Temporal)    Ht 5\' 4"  (1.626 m)    Wt 97.5 kg (215 lb)    LMP 12/10/2018 (Exact Date)    SpO2 97%    BMI 36.90 kg/m    Genitourinary:     Comments: L labia minora is a papule that has a yellowish color.  NO punctum, redness to TTP  Neurological:      Mental Status: She is alert.         ASSESSMENT/PLAN  (R87.612) Low grade squamous intraepith lesion on cytologic smear cervix (lgsil)  (primary encounter diagnosis)  Comment: Based on the recent ASCCP recommendation, pap repeated today.  Aware that if it is normal, she will need a repeat pap next year 2021.  Will send a mychart message with test result.   Plan: CHLAMYDIA GC NAAT, CYTOPATH, C/V, THIN LAYER,         OBTAINING SCREEN PAP SMEAR        Epidermal inclusion cyst in the vagina.  Reassured about the benign nature of the condition.

## 2019-01-07 LAB — CHLAMYDIA GC NAAT
CHLAMYDIA TRACHOMATIS NAAT: NEGATIVE
NEISSERIA GONORRHOEAE NAAT: NEGATIVE

## 2019-01-10 ENCOUNTER — Encounter (HOSPITAL_BASED_OUTPATIENT_CLINIC_OR_DEPARTMENT_OTHER): Payer: Self-pay | Admitting: Family Medicine

## 2019-01-10 DIAGNOSIS — R87612 Low grade squamous intraepithelial lesion on cytologic smear of cervix (LGSIL): Secondary | ICD-10-CM

## 2019-01-10 LAB — CYTOPATH, C/V, THIN LAYER

## 2019-02-15 ENCOUNTER — Ambulatory Visit: Payer: BC Managed Care – PPO | Attending: Family Medicine | Admitting: Physician Assistant

## 2019-02-15 ENCOUNTER — Other Ambulatory Visit: Payer: Self-pay

## 2019-02-15 ENCOUNTER — Encounter (HOSPITAL_BASED_OUTPATIENT_CLINIC_OR_DEPARTMENT_OTHER): Payer: Self-pay | Admitting: Physician Assistant

## 2019-02-15 ENCOUNTER — Ambulatory Visit (HOSPITAL_BASED_OUTPATIENT_CLINIC_OR_DEPARTMENT_OTHER): Payer: Self-pay | Admitting: Physician Assistant

## 2019-02-15 VITALS — BP 136/83 | HR 128 | Temp 97.9°F | Wt 217.0 lb

## 2019-02-15 DIAGNOSIS — L0291 Cutaneous abscess, unspecified: Secondary | ICD-10-CM | POA: Diagnosis not present

## 2019-02-15 MED ORDER — SULFAMETHOXAZOLE-TRIMETHOPRIM 400-80 MG PO TABS
1.0000 | ORAL_TABLET | Freq: Two times a day (BID) | ORAL | 0 refills | Status: DC
Start: 2019-02-15 — End: 2019-02-22

## 2019-02-15 NOTE — Progress Notes (Signed)
Racquel Merritt is a 23 year old female patient of [name], MD who presents to the clinic today with abscess.    SUBJECTIVE:    The patient presents today with the following concerns:  1. Vaginal Abscess     Patient Active Problem List:     BMI 31.0-31.9,adult     Family history of diabetes mellitus     Peripheral retinal degeneration, lattice     Low grade squamous intraepith lesion on cytologic smear cervix (lgsil)    norethindrone-ethinyl estradiol (LOESTRIN 1/20, 21,) 1-20 MG-MCG per tablet, Take 1 tablet by mouth daily, Disp: 84 tablet, Rfl: 3    ibuprofen (ADVIL,MOTRIN) tablet 600 mg, 600 mg, Oral, Once, [name], MD      All medications reviewed with patient.  Review of Patient's Allergies indicates:  No Known Allergies  Social History    Tobacco Use      Smoking status: Passive Smoke Exposure - Never Smoker      Smokeless tobacco: Never Used      Tobacco comment: dad smoked in house    Alcohol use: Yes      Comment: 1-2 weekly    Drug use: No    Medical/Surgical/Family History reviewed with the patient.  Relevant changes have been made in 'history' section.    Recent laboratories and imaging reviewed prior to visit.    Review of Systems/HPI:    Problem:  -  Vaginal Abscess:   Pt reports that she began to develop a skin infection near vagina on Friday   Became very painful on Sat and she used warm compress and Advil for this   Warm compress caused drainage of abscess with significantly more drainage on Sunday   Has had this problem in the past requiring I+D and abx   Currently pain affects walking and sitting, no warm compresses today   Reports that she does shave but has had no hot tub exposure   Denies: fever, sob, cp, vaginal discharge       All other systems reviewed and negative.    OBJECTIVE:    Vital Signs:  BP 136/83    Pulse 128    Temp 97.9 F (36.6 C) (Temporal)    Wt 98.4 kg (217 lb)    LMP 01/29/2019    SpO2 99%    BMI 37.25 kg/m   Pain Score: Data Unavailable        Physical  Exam:  General: Well developed, well nourished female sitting comfortably  HEENT: Head normocephalic and atraumatic,  mucosa moist  Neck: full range of motion   GU: pt has an erythematous soft and partially indurated nodule on the lower aspect of outer labia majora. erythema extends upwards past swelling Area is significantly tender to touch. Some drainage from wound noted, there is an opening that has healed. There is no extension of erythema or swelling into vagina   Extremities: Warm and well perfused  Neurologic: Alert and oriented X3, gait normal   Psychiatric:  Mood and behavior normal with appropriate affect    ASSESSMENT AND PLAN:  Additional plans reviewed with patient and listed below under patient instructions and provided in AVS.    (L02.91) Abscess  Comment: pt presents with abscess of labium majora   Plan:   Given appearance and history, this is likely a purulent cellulitis with abscess. Considered bartholin cyst, but location makes this unlikely. Given warm compresses have caused drainage, I think that tx with an antibiotic is adequate without drainage.  Will tx with bactrim BID x 7 days. If no improvement or worsening after 3-4 days pt should go to urgent care for I+D given pt pain level. Advised on continued BID warm compresses and motrin 800 mg for pain management and to help area drain. Pt understands and agrees to plan. Reasons to return to clinic or seek emergent care discussed      HEALTH MAINTENANCE:  HEP C SCREEN due on 05/03/2012  LIPID SCREENING due on 05/14/2017  INFLUENZA VACCINE(1) due on 11/10/2018    PATIENT INSTRUCTIONS:  See below for additional instructions provided to the patient with the After Visit Summary:  There are no Patient Instructions on file for this visit.    I have spent 25 minutes with this patient/patient proxy of which > 50% was in counseling or coordination of care regarding above issues/Dx.      I explained the diagnosis and treatment plan, and the  patient/parent/guardian expressed understanding of the content. We discussed all medicines prescribed and the importance of medication adherence. The patient/parent/guardian expressed understanding and no barriers to adherence were identified.  Possible side effects of the prescribed medication(s) were explained.  I attempted to answer all questions regarding the diagnosis and the proposed treatment.    We discussed the patients current medications including proper use and potential side effects. The patient expressed understanding and no barriers to adherence were identified.     1. The patient indicates understanding of these issues and agrees with the plan. Brief care plan is updated and reviewed with the patient.   2. The patient is given an After Visit Summary sheet that lists all medications with directions, allergies, orders placed during this encounter, and follow-up instructions.   3. I reviewed the patient's medical information and medical history   4. I reconciled the patient's medication list and prepared and supplied needed refills.   5. I have reviewed the past medical, family, and social history sections including the medications and allergies.    Ralene Cork, PA-C

## 2019-02-15 NOTE — Telephone Encounter (Signed)
Regarding: Vaginal Cyst pt pain  ----- Message from Randel Books sent at 02/15/2019  8:27 AM EST -----  Nelson Chimes,  [id number],  23 year old,  female  Telephone Information:  Home Phone      [phone]  Work Phone      [phone]  Mobile          [phone]    Patient's PCP: [name], MD    Patient's language of care:   English  Caller does not need an interpreter.    Person calling on behalf of patient: Patient (self) / Dawn Merritt    Pt would like to be seen today. Offered next available appt on televisit/appt Wednesday in person was declined. Pt declined appt.   What are the symptoms: pt complaining of a Vaginal Cyst she knew she had and now she is seeing liquid coming out and pt said she is in pain.    televisit is not ok pt said she would rather be seen in person.    How long has patient been sick? Since Friday night  What has pt tried at home?: advil and hot compress   Where is the patient located at the moment: home     CALL BACK NUMBER: [place]

## 2019-02-15 NOTE — Telephone Encounter (Signed)
Called pt back. Pt reports that she has had a "vaginal cyst" that has been painful and leaking. Reports that there is an open lesion on the skin next to the vagina and that it has been getting more painful and bigger since it started.     I offered pt an in person visit with me at 130 pm today. Pt accepts. Will provide a female [place] for a chaperone during visit as no female provider was available as requested.     Ralene Cork, PA-C

## 2019-02-21 ENCOUNTER — Encounter (HOSPITAL_BASED_OUTPATIENT_CLINIC_OR_DEPARTMENT_OTHER): Payer: Self-pay | Admitting: Family Medicine

## 2019-02-22 ENCOUNTER — Other Ambulatory Visit (HOSPITAL_BASED_OUTPATIENT_CLINIC_OR_DEPARTMENT_OTHER): Payer: Self-pay | Admitting: Physician Assistant

## 2019-02-22 MED ORDER — SULFAMETHOXAZOLE-TRIMETHOPRIM 400-80 MG PO TABS
1.00 | ORAL_TABLET | Freq: Two times a day (BID) | ORAL | 0 refills | Status: AC
Start: 2019-02-22 — End: 2019-02-25

## 2019-02-24 IMAGING — US US MFM FETAL NUCHAL TRANSLUCENCY
1 series · 15 of 28 positions shown · non-contrast
Comparison: none

[Series 1: us mfm fetal nuchal translucency · 38 acquisitions, 15 frames shown]
[im 1/38]
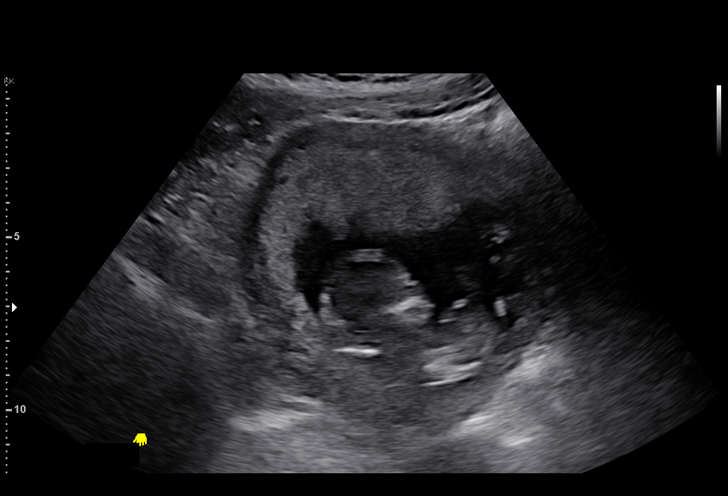
[im 3/38]
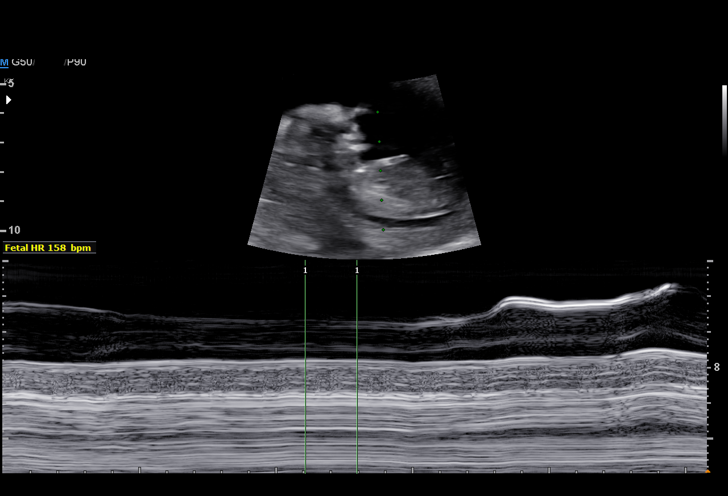
[im 6/38]
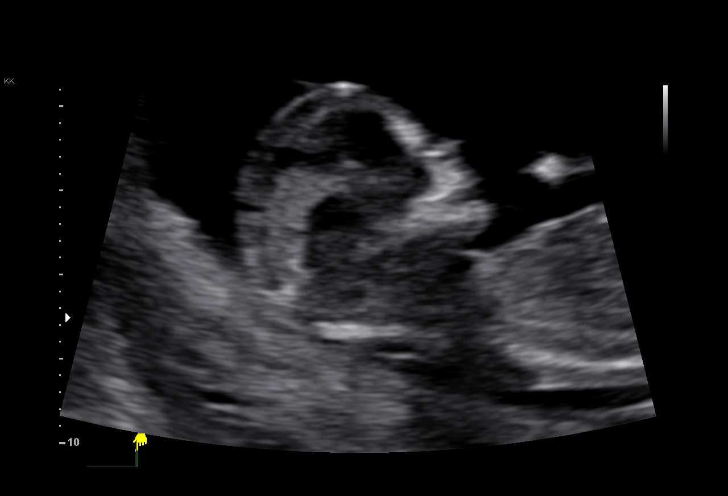
[im 9/38]
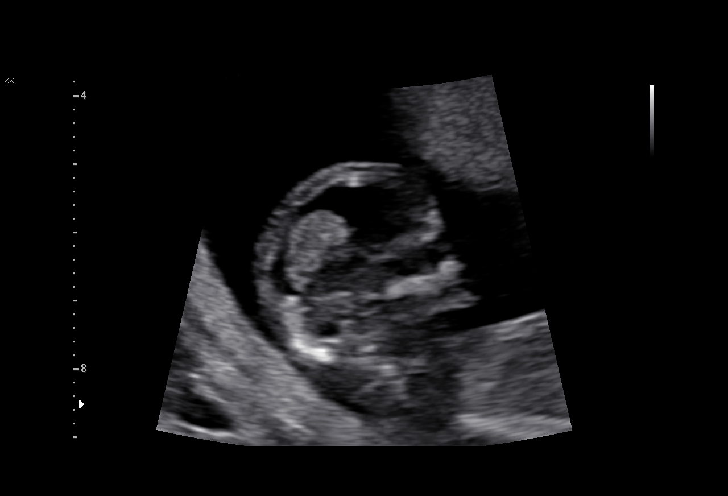
[im 11/38]
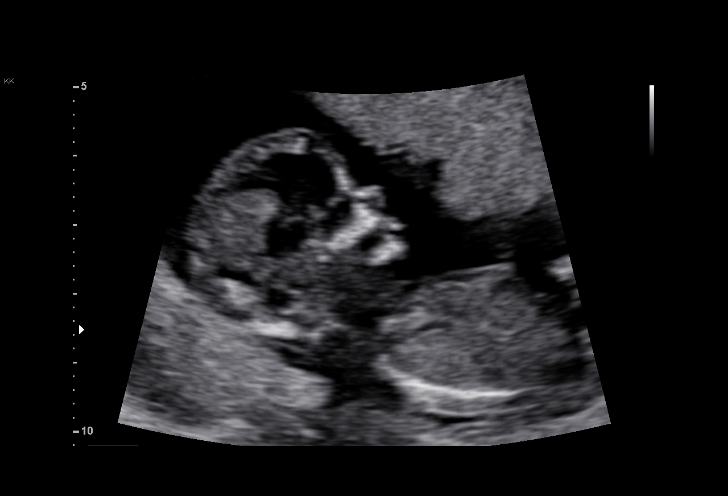
[im 14/38]
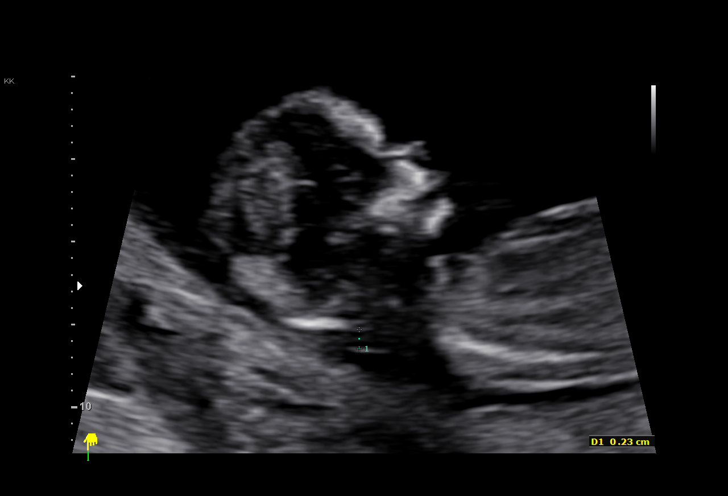
[im 17/38]
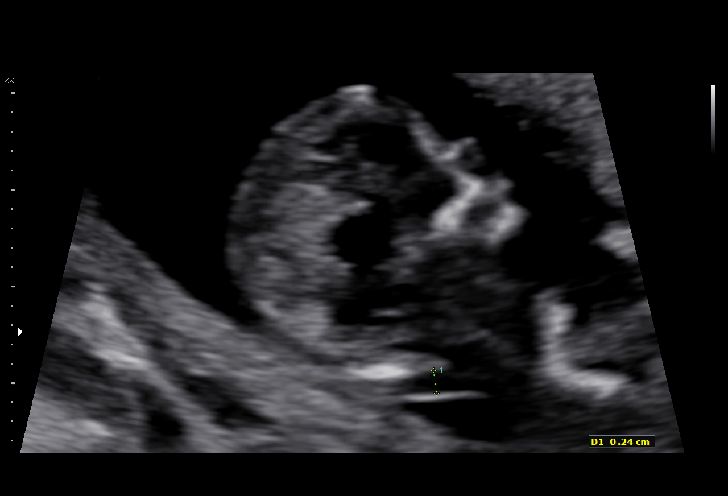
[im 20/38]
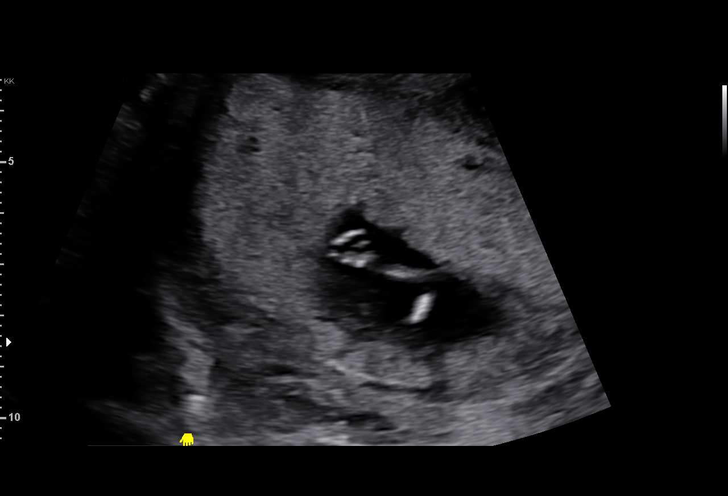
[im 21/38]
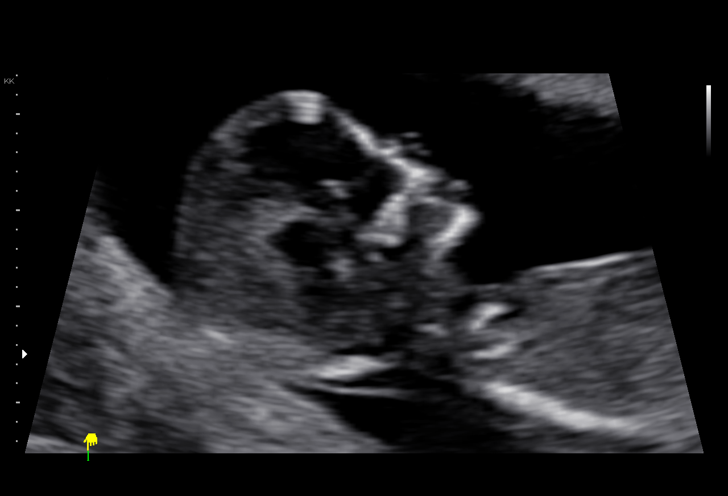
[im 24/38]
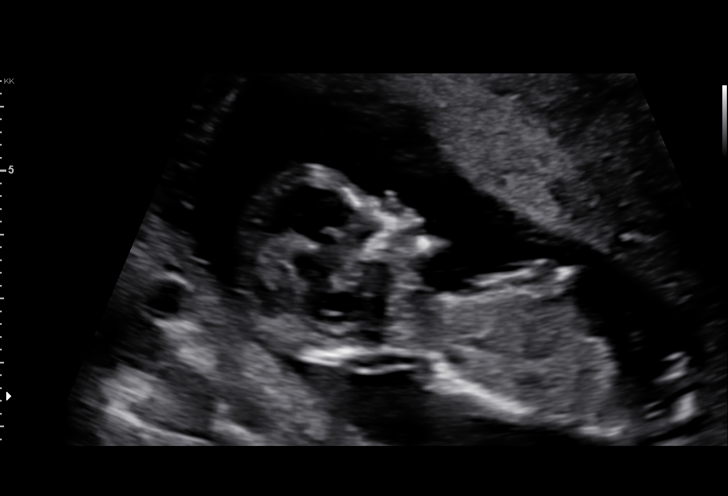
[im 27/38]
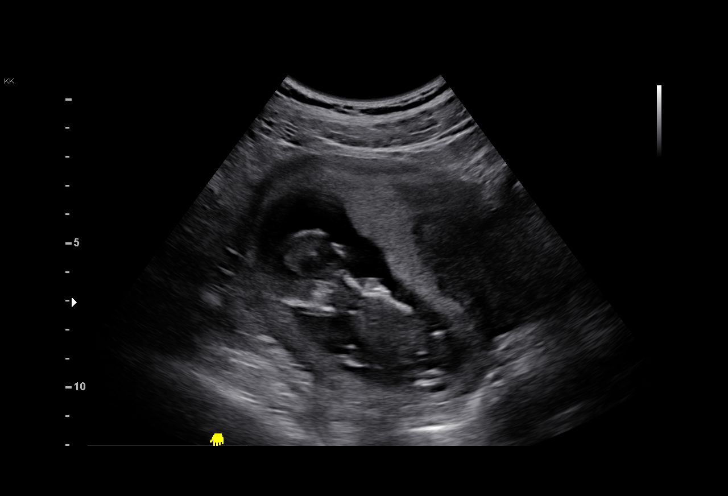
[im 29/38]
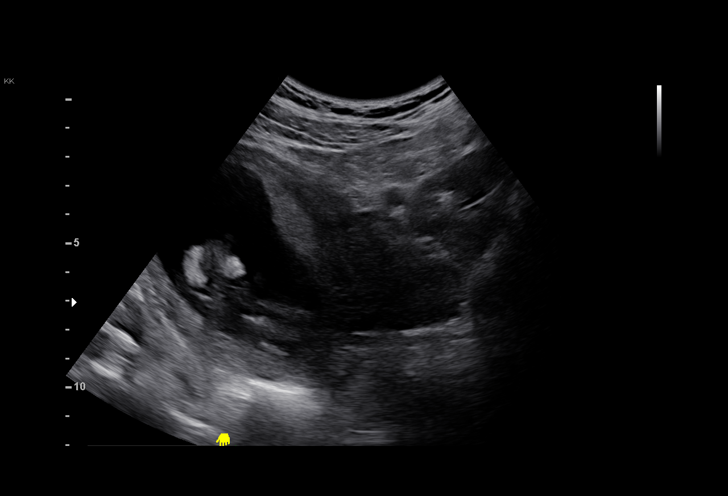
[im 32/38]
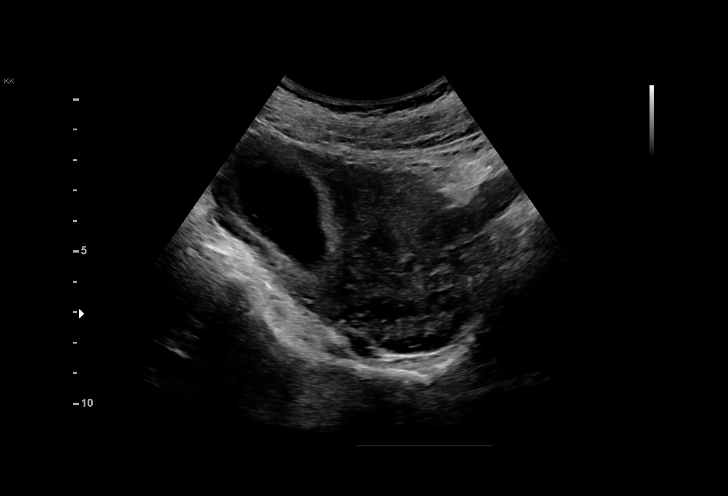
[im 35/38]
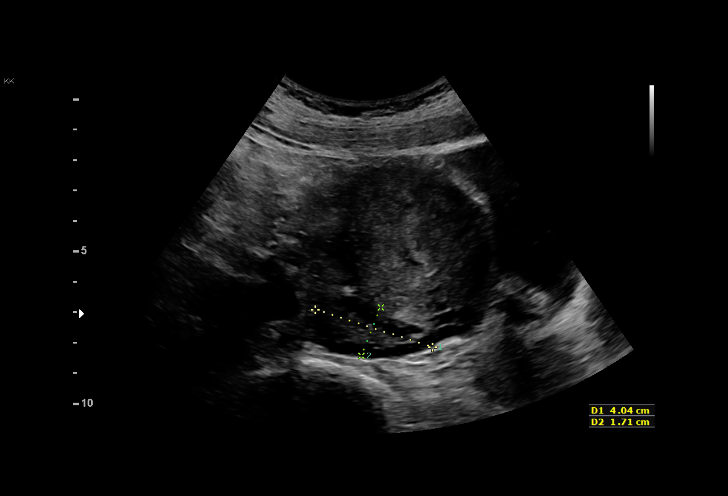
[im 38/38]
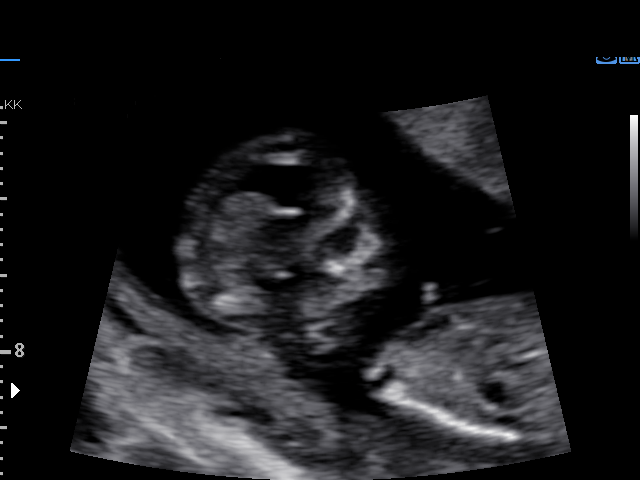

[15 of 28 positions shown; findings below may reference images not displayed]

[REDACTED]. [HOSPITAL],

TRANSLUCENCY

1  SADNAH IGNACE            [id number]      [id number]     [id number]
Indications

13 weeks gestation of pregnancy
Encounter for nuchal translucency
OB History

Blood Type:            Height:  5'5"   Weight (lb):  140       BMI:
Gravidity:    1
Fetal Evaluation

Num Of Fetuses:     1
Fetal Heart         158
Rate(bpm):
Cardiac Activity:   Observed
Presentation:       Variable

Amniotic Fluid
AFI FV:      Subjectively within normal limits
Biometry

CRL:      74.3  mm     G. Age:  13w 2d                  EDD:   07/29/17
Gestational Age

LMP:           13w 3d        Date:  10/21/16                 EDD:   07/28/17
Best:          13w 3d     Det. By:  LMP  (10/21/16)          EDD:   07/28/17
Cervix Uterus Adnexa
Left Ovary
Within normal limits.

Right Ovary
Not visualized.

Adnexa:       No abnormality visualized.
Impression

SIUP at 57w7d
active fetus
CRL is consistent with EDC as previously established
NT =2.4
nasal bone is present
fetal morphology is gestational age appropriate
Recommendations

1. analytes drawn and sent
2. fetal survey at 18 weeks.

## 2019-05-11 ENCOUNTER — Inpatient Hospital Stay: Admit: 2019-05-11 | Discharge: 2019-05-17 | Payer: MEDICAID | Source: Home / Self Care | Admitting: Psychiatry

## 2019-05-11 ENCOUNTER — Encounter: Admit: 2019-05-11 | Payer: PRIVATE HEALTH INSURANCE | Primary: Cardiovascular Disease

## 2019-05-11 DIAGNOSIS — T1491XA Suicide attempt, initial encounter: Secondary | ICD-10-CM

## 2019-05-11 DIAGNOSIS — F419 Anxiety disorder, unspecified: Secondary | ICD-10-CM

## 2019-05-11 DIAGNOSIS — F32A Depression: Secondary | ICD-10-CM

## 2019-05-11 DIAGNOSIS — J020 Streptococcal pharyngitis: Secondary | ICD-10-CM

## 2019-05-11 LAB — CBC WITH AUTO DIFFERENTIAL
BKR WAM ABSOLUTE IMMATURE GRANULOCYTES: 0 x 1000/ÂµL (ref 0.0–0.4)
BKR WAM ABSOLUTE LYMPHOCYTE COUNT: 2.4 x 1000/ÂµL (ref 0.5–5.4)
BKR WAM ABSOLUTE NRBC: 0 x 1000/ÂµL
BKR WAM ANALYZER ANC: 5.3 x 1000/ÂµL (ref 2.2–7.2)
BKR WAM BASOPHIL ABSOLUTE COUNT: 0 x 1000/ÂµL (ref 0.0–0.2)
BKR WAM BASOPHILS: 0.5 % (ref 0.0–2.0)
BKR WAM EOSINOPHIL ABSOLUTE COUNT: 0 x 1000/ÂµL (ref 0.0–0.4)
BKR WAM EOSINOPHILS: 0.5 % (ref 0.0–4.0)
BKR WAM HEMATOCRIT: 38.3 % (ref 36.0–48.0)
BKR WAM HEMOGLOBIN: 12.9 g/dL (ref 12.0–15.0)
BKR WAM IMMATURE GRANULOCYTES: 0.4 % (ref 0.0–0.4)
BKR WAM LYMPHOCYTES: 29.1 % (ref 10.0–50.0)
BKR WAM MCH (PG): 29.3 pg (ref 25.0–35.0)
BKR WAM MCHC: 33.7 g/dL (ref 33.0–37.0)
BKR WAM MCV: 86.8 fL (ref 81.0–99.0)
BKR WAM MONOCYTE ABSOLUTE COUNT: 0.5 x 1000/ÂµL (ref 0.1–1.2)
BKR WAM MONOCYTES: 5.9 % (ref 3.0–11.0)
BKR WAM MPV: 10.9 fL (ref 8.0–12.0)
BKR WAM NEUTROPHILS: 63.6 % (ref 45.0–90.0)
BKR WAM NUCLEATED RED BLOOD CELLS: 0 % (ref 0.0–0.0)
BKR WAM PLATELETS: 192 x1000/ÂµL (ref 120–450)
BKR WAM RDW-CV: 12.9 % (ref 11.5–14.5)
BKR WAM RED BLOOD CELL COUNT: 4.4 M/ÂµL (ref 3.5–5.5)
BKR WAM WHITE BLOOD CELL COUNT: 8.3 x1000/ÂµL (ref 4.8–10.8)

## 2019-05-11 LAB — HCG, QUANTITATIVE     (BH GH LMW YH): BKR HCG, QUANTITATIVE: <1 m[IU]/mL

## 2019-05-11 LAB — COMPREHENSIVE METABOLIC PANEL
BKR A/G RATIO: 1.7 (ref 1.0–2.2)
BKR ALANINE AMINOTRANSFERASE (ALT): 10 U/L (ref 10–35)
BKR ALBUMIN: 4.5 g/dL (ref 3.6–4.9)
BKR ALKALINE PHOSPHATASE: 36 U/L (ref 9–122)
BKR ANION GAP: 12 (ref 7–17)
BKR ASPARTATE AMINOTRANSFERASE (AST): 21 U/L (ref 10–35)
BKR BILIRUBIN TOTAL: 0.5 mg/dL (ref <=1.2)
BKR BLOOD UREA NITROGEN: 10 mg/dL (ref 6–20)
BKR BUN / CREAT RATIO: 13.2 (ref 8.0–23.0)
BKR CALCIUM: 9.2 mg/dL (ref 8.8–10.2)
BKR CHLORIDE: 105 mmol/L (ref 98–107)
BKR CO2: 23 mmol/L (ref 20–30)
BKR CREATININE: 0.76 mg/dL (ref 0.40–1.30)
BKR EGFR (AFR AMER): >60 mL/min/{1.73_m2} (ref >60)
BKR EGFR (NON AFRICAN AMERICAN): >60 mL/min/{1.73_m2} (ref >60)
BKR GLOBULIN: 2.6 g/dL
BKR GLUCOSE: 83 mg/dL (ref 70–100)
BKR POTASSIUM: 4 mmol/L (ref 3.3–5.1)
BKR PROTEIN TOTAL: 7.1 g/dL (ref 6.6–8.7)
BKR SODIUM: 140 mmol/L (ref 136–144)

## 2019-05-11 LAB — TSH: BKR THYROID STIMULATING HORMONE: 0.979 u[IU]/mL

## 2019-05-11 LAB — SARS COV-2 (COVID-19) RNA: BKR SARS-COV-2 RNA (COVID-19) (YH): NOT DETECTED

## 2019-05-11 MED ORDER — LORAZEPAM 1 MG TABLET
1 mg | Freq: Once | ORAL | Status: CP
Start: 2019-05-11 — End: ?
  Administered 2019-05-11: 19:00:00 1 mg via ORAL

## 2019-05-11 MED ORDER — MELATONIN 3 MG TABLET
3 mg | Freq: Every evening | ORAL | Status: DC
Start: 2019-05-11 — End: 2019-05-13
  Administered 2019-05-12 – 2019-05-13 (×2): 3 mg via ORAL

## 2019-05-11 MED ORDER — DIPHENHYDRAMINE 50 MG/ML INJECTION SOLUTION
50 mg/mL | INTRAMUSCULAR | Status: DC | PRN
Start: 2019-05-11 — End: 2019-05-18

## 2019-05-11 MED ORDER — SENNOSIDES 8.6 MG-DOCUSATE SODIUM 50 MG TABLET
8.6-50 mg | Freq: Every day | ORAL | Status: DC | PRN
Start: 2019-05-11 — End: 2019-05-18

## 2019-05-11 MED ORDER — IBUPROFEN 800 MG TABLET
800 mg | Freq: Four times a day (QID) | ORAL | Status: DC | PRN
Start: 2019-05-11 — End: 2019-05-13

## 2019-05-11 MED ORDER — TRAZODONE 50 MG TABLET
50 mg | Freq: Every evening | ORAL | Status: DC | PRN
Start: 2019-05-11 — End: 2019-05-18

## 2019-05-11 MED ORDER — OLANZAPINE (ZYPREXA) IM INJECTION (MIXTURE)
Freq: Three times a day (TID) | INTRAMUSCULAR | Status: DC | PRN
Start: 2019-05-11 — End: 2019-05-18
  Administered 2019-05-15: 12:00:00 2.000 mL via INTRAMUSCULAR

## 2019-05-11 MED ORDER — QUETIAPINE IMMEDIATE RELEASE 50 MG TABLET
50 mg | ORAL | Status: DC | PRN
Start: 2019-05-11 — End: 2019-05-18
  Administered 2019-05-12 – 2019-05-13 (×3): 50 mg via ORAL

## 2019-05-11 MED ORDER — IBUPROFEN 600 MG TABLET
600 mg | Freq: Four times a day (QID) | ORAL | Status: DC | PRN
Start: 2019-05-11 — End: 2019-05-13

## 2019-05-11 MED ORDER — VENLAFAXINE ER 75 MG CAPSULE,EXTENDED RELEASE 24 HR
75 mg | ORAL | Status: DC
Start: 2019-05-11 — End: 2019-05-13
  Administered 2019-05-12: 13:00:00 75 mg via ORAL

## 2019-05-11 MED ORDER — POLYETHYLENE GLYCOL 3350 17 GRAM ORAL POWDER PACKET
17 gram | Freq: Every evening | ORAL | Status: DC | PRN
Start: 2019-05-11 — End: 2019-05-18

## 2019-05-11 MED ORDER — HYDROXYZINE HCL 25 MG TABLET
25 mg | ORAL | Status: DC | PRN
Start: 2019-05-11 — End: 2019-05-18
  Administered 2019-05-12 – 2019-05-17 (×6): 25 mg via ORAL

## 2019-05-11 MED ORDER — BENZTROPINE 1 MG TABLET
1 mg | ORAL | Status: DC | PRN
Start: 2019-05-11 — End: 2019-05-18

## 2019-05-11 MED ORDER — IBUPROFEN 400 MG TABLET
400 mg | ORAL | Status: DC | PRN
Start: 2019-05-11 — End: 2019-05-13

## 2019-05-11 MED ORDER — QUETIAPINE IMMEDIATE RELEASE 50 MG TABLET
50 mg | ORAL | Status: DC | PRN
Start: 2019-05-11 — End: 2019-05-18
  Administered 2019-05-12 – 2019-05-14 (×3): 50 mg via ORAL

## 2019-05-11 MED ORDER — HYDROXYZINE HCL 25 MG TABLET
25 mg | ORAL | Status: DC | PRN
Start: 2019-05-11 — End: 2019-05-12

## 2019-05-11 NOTE — ED Provider Notes
HistoryChief Complaint Patient presents with ? Psychiatric Evaluation   hx depression. Worsening depression. Not taking meds. Feeling suicidal.   24 y/o F w/ a pertinent PMhx of anxiety & depression here requesting to be admitted due to increased depression w/ passive S.I.  Has been off her medication x10 months.  Currently couch surfing w/ son from various houses.  Currently unemployed.  Denies A.H, V.H. Past Medical History: Diagnosis Date ? Anxiety  ? Depression  ? Strep throat  Past Surgical History: Procedure Laterality Date ? DENTAL SURGERY   ? HERNIA REPAIR Right age 15  RLQ area ? HERNIA REPAIR   ? TONSILLECTOMY Bilateral 2013 ? TONSILLECTOMY   No family history on file.Social History Socioeconomic History ? Marital status: Single   Spouse name: Not on file ? Number of children: Not on file ? Years of education: Not on file ? Highest education level: Not on file Social Needs ? Financial resource strain: Somewhat hard ? Food insecurity   Worry: Sometimes true   Inability: Sometimes true ? Transportation needs   Medical: No   Non-medical: No Tobacco Use ? Smoking status: Never Smoker ? Smokeless tobacco: Never Used Substance and Sexual Activity ? Alcohol use: No ? Drug use: No ? Sexual activity: Yes   Partners: Male   Birth control/protection: Condom Lifestyle ? Physical activity   Days per week: 0 days   Minutes per session: 0 min ? Stress: Very much Relationships ? Social Manufacturing systems engineer on phone: Once a week   Gets together: Once a week   Attends religious service: 1 to 4 times per year   Active member of club or organization: No   Attends meetings of clubs or organizations: Never   Relationship status: Never married ? Intimate partner violence   Fear of current or ex partner: No   Emotionally abused: No   Physically abused: No   Forced sexual activity: No Social History Narrative  Pt is currently unemployed and have a 61 month old son, currently living with parents in [place]. ED Other Social History ? Amphetamine frequency Never used Never used on 06/18/2018 ? Cannabis frequency 3 or more times/week Never used on 06/18/2018 Never used ->3 or more times/week on 12/15/2018 ? Cocaine frequency Never used Never used on 06/18/2018 ? Ecstasy frequency Never used Never used on 06/18/2018 ? Hallucinogen frequency Never used Never used on 06/18/2018 ? Inhalant frequency Never used Never used on 06/18/2018 ? Opiate frequency Never used Never used on 06/18/2018 ? Sedative frequency Never used Never used on 06/18/2018 ? Other drug frequency Never used Never used on 06/18/2018 E-cigarette/Vaping Substances E-cigarette/Vaping Devices Review of Systems Respiratory: Negative for shortness of breath.  Cardiovascular: Negative for chest pain. Neurological: Negative for dizziness and headaches. Psychiatric/Behavioral: Positive for suicidal ideas. Negative for confusion and hallucinations.  Physical ExamED Triage Vitals [05/11/19 1052]BP: 119/76Pulse: 82Pulse from  O2 sat: n/aResp: 18Temp: 97 ?F (36.1 ?C)Temp src: n/aSpO2: 99 % BP 119/76  - Pulse 82  - Temp 97 ?F (36.1 ?C)  - Resp 18  - Wt 61 kg (134 lb 7.7 oz)  - SpO2 99%  - BMI 23.08 kg/m? Physical ExamVitals signs and nursing note reviewed. Constitutional:     Appearance: Normal appearance.    Comments: Tearful during interview HENT:    Head: Normocephalic and atraumatic. Cardiovascular:    Rate and Rhythm: Normal rate and regular rhythm.    Pulses: Normal pulses. Pulmonary:    Effort: Pulmonary effort is normal. No respiratory distress.  Neurological:    Mental Status: She is alert and oriented to person, place, and time. Psychiatric:    Comments: Tearful  ProceduresProcedures ED COURSEReviewed previous: previous chartPatient Reevaluation: A/P:The patient presented with primary psychiatric complaints.  Based upon my history, physical exam and clinical judgement the patient currently has no acute medical issues and is medically stable and cleared for evaluation and disposition by the psychiatry department.  Can be managed by line of site. ED CRISIS notified.Clinical Impressions as of May 11 1103 Depression with suicidal ideation  ED DispositionObservation Oran Rein, PA03/02/21 1105

## 2019-05-11 NOTE — Progress Notes
12:13 PMReceived report from Amy RN prior to patient transfer to MME. Patient calm and cooperative with search. Awaiting evaluation by provider. Will continue to monitor, assess and provide support as needed.14:00 PMPatient evaluated by Verita Lamb LCSW and case discussed with Greig Castilla PA. Per treatment team, patient meets criteria for inpatient hospitalization. Cristine Connor LCSW had patient sign in voluntarily. Patient compliant with COVID-19 swab. Patient c/o anxiety. Greig Castilla PA aware. Patient offered and accepted Ativan 1 mg PO at 1339, pending effect. Patient's mother arrived to ED to pick patient's keys up. Patient gave permission for her mother to take her keys. Keys walked to ED lobby and handed keys to patient's mother by EDT. Will continue to monitor, assess and provide support as needed.6:36 PM Previous medication administration noted to be effective, patient sleeping for remainder of shift. Will continue to monitor, assess and provide support as needed.

## 2019-05-11 NOTE — ED Triage Note
I evaluated this patient as the PA in triage and completed a medical screening examination.  Patient will need further care.  Initial care orders will be placed as appropriate.Ivan Croft, PA

## 2019-05-12 LAB — ZZZTOXICOLOGY SCREEN, URINE     (BH L)
BKR 6-ACETYLMORPHINE: NEGATIVE
BKR AMPHETAMINE SCREEN, URINE: NEGATIVE
BKR BARBITURATE SCREEN, URINE: NEGATIVE
BKR BENZODIAZEPINE SCREEN, URINE: NEGATIVE
BKR CANNABINOID SCREEN, URINE: POSITIVE — AB
BKR COCAINE METABOLITES URINE: NEGATIVE
BKR ETHANOL URINE: NEGATIVE
BKR METHADONE METABOLITE SCREEN, URINE, NO CONF.: NEGATIVE
BKR OPIATE SCREEN, URINE: NEGATIVE
BKR OXYCODONE SCREEN, URINE: NEGATIVE
BKR PHENCYCLIDINE SCREEN, URINE: NEGATIVE
BKR PROPOXYPHENE: NEGATIVE

## 2019-05-12 LAB — VITAMIN B12: BKR VITAMIN B12: 659 pg/mL (ref 232–1245)

## 2019-05-12 LAB — LIPID PANEL
BKR CHOLESTEROL/HDL RATIO: 2.6 (ref 0.0–5.0)
BKR CHOLESTEROL: 134 mg/dL
BKR HDL CHOLESTEROL: 52 mg/dL (ref >=40)
BKR LDL CHOLESTEROL CALCULATED: 75 mg/dL
BKR TRIGLYCERIDES: 37 mg/dL

## 2019-05-12 LAB — HEMOGLOBIN A1C
BKR ESTIMATED AVERAGE GLUCOSE: 100 mg/dL
BKR HEMOGLOBIN A1C: 5.1 % (ref 4.0–5.6)

## 2019-05-12 MED ORDER — QUETIAPINE IMMEDIATE RELEASE 50 MG TABLET
50 mg | Freq: Every evening | ORAL | Status: DC
Start: 2019-05-12 — End: 2019-05-13
  Administered 2019-05-13: 01:00:00 50 mg via ORAL

## 2019-05-12 MED ORDER — ONDANSETRON 4 MG DISINTEGRATING TABLET
4 mg | Freq: Three times a day (TID) | Status: DC | PRN
Start: 2019-05-12 — End: 2019-05-18
  Administered 2019-05-12: 22:00:00 4 mg

## 2019-05-12 NOTE — Progress Notes
Writer received a return phone call from [facility], [name], [phone] and she was updated on patient's case. Geralyn Flash can be contacted for updates regarding patient's treatment plan. Team awareCristine Fredricka Bonine, LCSW

## 2019-05-12 NOTE — Plan of Care
Plan of Care Overview/ Patient Status    I feel really lethargic and I just threw up. Episode of emesis witnessed x1. Patient stated she really did not eat anything today other than fruit. I think it's the Effexor. I took it and didn't end up eating after. PA made aware. Patient provided with saltines and fluids. States her appetite has been poor lately. Endorsed depression and feeling lonely. Isolative to room, resting throughout the shift. Denied current suicidal/homicidal ideations and auditory/visual hallucinations. Feels safe and is willing to contract for safety. Denied paranoia. Later in the shift patient was observed teraful in her room. My mother and grandmother are upsetting me. I have no place to go. I'm homeless. Endorsed racing thoughts and agitation related to phone call with grandmother. PRN Seroquel 50 mg PO received at 1246, good effect. Medication compliant. Q15 minute safety checks continued.Problem: Adult Inpatient Plan of CareGoal: Plan of Care ReviewOutcome: Interventions implemented as appropriateGoal: Patient-Specific Goal (Individualized)Outcome: Interventions implemented as appropriateGoal: Absence of Hospital-Acquired Illness or InjuryOutcome: Interventions implemented as appropriateGoal: Optimal Comfort and WellbeingOutcome: Interventions implemented as appropriateGoal: Readiness for Transition of CareOutcome: Interventions implemented as appropriate Problem: Adult Behavioral Health Plan of CareGoal: Plan of Care ReviewOutcome: Interventions implemented as appropriateGoal: Patient-Specific Goal (Individualization)Outcome: Interventions implemented as appropriateGoal: Adheres to Safety Considerations for Self and OthersOutcome: Interventions implemented as appropriateGoal: Absence of New-Onset Illness or InjuryOutcome: Interventions implemented as appropriateGoal: Optimized Coping Skills in Response to Life StressorsOutcome: Interventions implemented as appropriateGoal: Develops/Participates in Biomedical scientist to Support Successful TransitionOutcome: Interventions implemented as appropriate Problem: Psychiatry Goal & Intervention PlanGoal: AggressionDescription: Signs and symptoms will be absent or manageable by discharge/transition of careOutcome: Interventions implemented as appropriateNote: Interventions from the Interdisciplinary Treatment Plan (SPOC)1.  No value filed. No value filed.2.  No value filed. No value filed.3.  No value filed. No value filed.4.  No value filed. No value filed.5.  No value filed. No value filed.Goal: Mood AlterationDescription: Signs and symptoms will be absent or manageable by discharge/transition of careOutcome: Interventions implemented as appropriateNote: Interventions from the Interdisciplinary Treatment Plan (SPOC)1.  Provide counseling and emotional support Q shift2.  Provide a safe environment Q shift3.  Encourage healthy eating and sleeping patterns and self-care activities Q shift4.  No value filed. No value filed.5.  No value filed. No value filed.Goal: Suicide RiskDescription: Signs and symptoms will be absent or manageable by discharge/transition of careOutcome: Interventions implemented as appropriateNote: Interventions from the Interdisciplinary Treatment Plan (SPOC)1.  Provide suicide prevention plan Prior to discharge2.  Provide safe environment Q shift3.  Promote and collaborate with support system Prior to discharge4.  No value filed. No value filed.5.  No value filed. No value filed.

## 2019-05-12 NOTE — Group Note
Group Session:  Socialization / Social SkillsGroup Start Time:   1305      End Time:  1345Facilitators:  Amedeo Kinsman, CTRSSession Focus Improved interpersonal effectivenessEngagement in Treatment Number of Participants 9 Treatment Modality Therapeutic Recreation Interventions/Education Helped patient join group to decrease sense of isolationEncouraged pro-social behaviorHelped patient increase comfort with othersEncouraged increased initiation in groupsTo increase communicationTo increase involvement in leisureTo increase social involvement Attendance            Partial attendance due to left early.  Total number of minutes attended:  30-35Engagement InterventionsNot applicable - patient attended group without difficulty Degree of Participation Moderate and Active Behavior Alert, Calm, Cooperative and Interactive Speech/Thought Process Relevant Affect/Mood Stable Insight Good Judgment Good Response to Interventions Disengaged, Engaged and Receptive Individualization Patient was present at the start of group. Was calm and receptive, verbally engaged in group discussion. Thoughts expressed were appropriate and relevant. Maintained good level of attention. Patient tolerated majority of the session but excused yourself in the last 10-15 minutes.   Plan Continue per Plan of Care / Interdisciplinary Plan of Care
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
## 2019-05-12 NOTE — Plan of Care
Plan of Care Overview/ Patient Status    Admission Note NursingAmanda Scott is a 24 y.o. female admitted with a chief complaint of I needed help. Patient arrived from ED Crisis, admitted to [facility] on a Voluntary Basis, report received from [place], [place]. Patient has remained in behavioral control. Upon arrival to unit, patient presents with a constricted affect, anxious and depressed mood, cooperative and friendly during admission interview. Patient reports a history of anxiety and depression. She was last admitted to [facility] in April of 2020. Patient endorses previous suicide attempt by overdosing on medication. She self presented to the ED with complaints of worsening depression with suicidal ideations, endorses not wanting to live however denies suicidal plan or intent. Denies homicidal ideation although endorses a history of violent and aggressive behaviors that she had been arrested for in the past. Patient denies all hallucinations. Endorses paranoia and intrusive, racing thoughts. Patient stated that her last IOP, [facility], was not helpful and she is currently not connected to an outpatient provider or taking any of her medications in months.  Patient reported marijuana use, mostly on the weekend, it helps me sleep and relax. Denies tobacco, alcohol, and all other substance abuse. Denies PMHx and No Known Allergies. Patient is currently unemployed and homeless, staying with different family and friends with her 34-year old son, whom is staying with her mother while patient is hospitalized per Psychiatric Intake and collateral from Mother. Patient signed ROIs for mother, Cherity Blickenstaff; grandmother, Lelon Perla; and boyfriend, Durwin Nora (see chart). Denies any pending legal issues. Patient provided with patient's rights information, signed all admission documentation, accepting of two RN skin assessment, provided with a tour of her room and unit. Patient accepted her scheduled Melatonin 3mg  at 2234. She later reported racing and intrusive thoughts that were keeping me awake, requested and received PRN Seroquel 100mg  PO at 2316 with moderate effect. Patient noted to be sleeping a short time later and observed to sleep throughout the night without further event. Q15 minute checks maintained for safety.  Vitals:  05/11/19 1349 05/11/19 1747 05/11/19 2142 05/11/19 2233 BP: 101/65 94/61 104/65 101/62 Pulse: 62 74 (!) 59 66 Resp: 18 20 18 18  Temp: 98.3 ?F (36.8 ?C) 98.6 ?F (37 ?C) 98 ?F (36.7 ?C) 98.5 ?F (36.9 ?C) TempSrc:  Oral Oral Oral SpO2: 100% 98% 100% 100% Weight:    62.6 kg Height:    5' 4 (1.626 m) Oxygen TherapySpO2: 100 %I have reviewed the patient's current medication orders..Comments:See flowsheets, patient education and plan of care for additional information. Problem: Adult Inpatient Plan of CareGoal: Plan of Care ReviewOutcome: Initial problem identificationGoal: Patient-Specific Goal (Individualized)Outcome: Initial problem identificationGoal: Absence of Hospital-Acquired Illness or InjuryOutcome: Initial problem identificationGoal: Optimal Comfort and WellbeingOutcome: Initial problem identificationGoal: Readiness for Transition of CareOutcome: Initial problem identification Problem: Adult Behavioral Health Plan of CareGoal: Plan of Care ReviewOutcome: Initial problem identificationGoal: Patient-Specific Goal (Individualization)Outcome: Initial problem identificationGoal: Adheres to Safety Considerations for Self and OthersOutcome: Initial problem identificationGoal: Absence of New-Onset Illness or InjuryOutcome: Initial problem identificationGoal: Optimized Coping Skills in Response to Life StressorsOutcome: Initial problem identificationGoal: Develops/Participates in Therapeutic Alliance to Support Successful TransitionOutcome: Initial problem identification Problem: Psychiatry Goal & Intervention PlanGoal: AggressionDescription: Signs and symptoms will be absent or manageable by discharge/transition of careOutcome: Initial problem identificationNote: Interventions from the Interdisciplinary Treatment Plan (SPOC)1.  No value filed. No value filed.2.  No value filed. No value filed.3.  No value filed. No value filed.4.  No value filed. No value filed.5.  No value filed. No value filed.Goal: Mood AlterationDescription: Signs and symptoms will be absent or manageable by discharge/transition of careOutcome: Initial problem identificationNote: Interventions from the Interdisciplinary Treatment Plan (SPOC)1.  Provide counseling and emotional support Q shift2.  Provide a safe environment Q shift3.  Encourage healthy eating and sleeping patterns and self-care activities Q shift4.  No value filed. No value filed.5.  No value filed. No value filed.Goal: Suicide RiskDescription: Signs and symptoms will be absent or manageable by discharge/transition of careOutcome: Initial problem identificationNote: Interventions from the Interdisciplinary Treatment Plan (SPOC)1.  Provide suicide prevention plan Prior to discharge2.  Provide safe environment Q shift3.  Promote and collaborate with support system Prior to discharge4.  No value filed. No value filed.5.  No value filed. No value filed.

## 2019-05-12 NOTE — H&P
Universal Psychiatric Evaluation/History & PhysicalPhysical Exam Form Not Required for Psychiatric ConsultsPatient Name Zoe Scott  	          Date:  05/12/2019 8:59 AM DOB: [birth year]		  		Physical Examination:GENERAL:VITAL SIGNS: Well appearing female in no acute distressI have reviewed the patient's current vital signs as documented in the patient's EMR.  Last 24 hours: Temp:  [97.7 ?F (36.5 ?C)-98.6 ?F (37 ?C)] 97.7 ?F (36.5 ?C)Pulse:  [59-74] 60Resp:  [18-20] 18BP: (94-111)/(59-65) 111/59SpO2:  [98 %-100 %] 99 % EYES Anicteric sclera, EOMI, PERRLA. Conjunctivae pink. ENT: Oral Cavity is clear no mucosal lesions were seen. No evidence of thrush. Uvula is midline. NECK: No palpable thyroid nodules. Neck Supple. Trachea midline. HEART: Normal S1 and S2. RRR, no murmurs, rubs, or gallops.  LUNGS: Clear to auscultation. No wheezes or rales. ABDOMEN: Soft and non-tender with positive bowel sounds, no hepatomegaly or splenomegaly. No guarding or rigidity. MUSCULOSKELETAL: No axial skeletal tenderness, edema or clubbing SKIN There are no suspicious lesions, no nodules, or signs of infection. Right wrist question of previous scar vs scratch mark well healed no erythema.  LYMPH NODES: No cervical, supraclavicular, or axillary adenopathy EXTREMITIES: Peripheral pulses intact. No edema.   NEUROLOGICAL: Alert and oriented x3, CN 2-12 intact, muscle strength and function:5/5, DTR: normal  Romberg negative, cerebellar function grossly intact. Gait intact. No cogwheeling or tremor. No clonus.  Breast Exam:  Deferred Rectal Exam:  Deferred Genital Exam:  Deferred ROS - NauseaPMHx: No acute or chronic pertient issues per patient and chart review PSHx: No acute or chronic pertient issues per patient and chart reviewAllergies:  NKDALabs:CMP - grossly normalLipids - orderedTSH - grossly normalB12 - orderedCBC - grossly normalA1c - orderedUtox - pending hcg - <1 unremarkableEKG - 424 QTC, NSR with sinus arrythmiaCOVID - negativeImaging - None recentFindings:Emesis - Per RN informed that patient had emesis x1 this morning. Continue to monitor and encourage po intake as tolerated. Lab work indicates no abnormalities. Could consider Lexapro administration this morning as a potential etiology especially if patient did not consume breakfast. Manage Psychiatrically Wilnette Kales, PA-CSupervising Physician: Dr. Mina Marble, MD

## 2019-05-12 NOTE — Progress Notes
7:50 PM: Received report from [employer]. Assumed care of patient. Patient appears to be resting in bed, with eyes closed. No signs of acute distress verbalized or observed. Even and unlabored respirations at 18. 1747 vitals reviewed; BP slightly lower. Patient is easily awakened and denies pain, dizziness, or HA. Patient received ativan 1 mg 1339, and possible secondary due to medication. Fluids encouraged. Plan is to be admitted to [facility], will notify unit and provide report. Offers no complaints. Will continue to monitor, assess, and provide support as needed and per POC. 8:05 pm: Called [facility] to alert of admission. Spoke with Rosalita Chessman, and she reports they are currently doing bed changes, and once they are ready they will call this RN for report. 10:05 pm: RN report provided to [employer], on [facility]. Patient to transfer to [facility]. Will transfer with belongings and appropriate documents, with security and unit staff to bed 3127 A. Kimber RN notified that patient yet to receive QHS melatonin, so that she may have it to go to sleep on their unit. 2142 vitals reviewed; WNL. Remains in behavioral control. Safety maintained. Will continue to monitor, assess, and provide support as needed and per POC.
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
## 2019-05-13 MED ORDER — ACETAMINOPHEN 325 MG TABLET
325 mg | Freq: Four times a day (QID) | ORAL | Status: DC | PRN
Start: 2019-05-13 — End: 2019-05-18

## 2019-05-13 MED ORDER — LITHIUM CARBONATE ER 450 MG TABLET,EXTENDED RELEASE
450 mg | Freq: Two times a day (BID) | ORAL | Status: DC
Start: 2019-05-13 — End: 2019-05-18
  Administered 2019-05-13 – 2019-05-17 (×8): 450 mg via ORAL

## 2019-05-13 MED ORDER — QUETIAPINE IMMEDIATE RELEASE 100 MG TABLET
100 mg | Freq: Every evening | ORAL | Status: DC
Start: 2019-05-13 — End: 2019-05-14
  Administered 2019-05-14: 03:00:00 100 mg via ORAL

## 2019-05-13 MED ORDER — ZOLPIDEM 5 MG TABLET
5 mg | Freq: Every evening | ORAL | Status: DC
Start: 2019-05-13 — End: 2019-05-18
  Administered 2019-05-14 – 2019-05-17 (×4): 5 mg via ORAL

## 2019-05-13 NOTE — Group Note
Group Session:  Art TherapyGroup Start Time:   1000      End Time:  1100Facilitators:  Augusto Garbe, LPCSession Focus Engagement in treatmentSelf-awarenessSelf-expression Number of Participants 9 Treatment Modality Art Therapy Interventions/Education Assisted in strengthening of effective communication skillsAssisted patient with ability to complete taskEncouraged ability to ask for help / seek assistanceEncouraged increase in social interactionsEncouraged patient to develop self-awarenessFacilitated development of leisure skills Attendance            Partial attendance due to late arrival - other.  Total number of minutes attended:  45Engagement InterventionsNot applicable - patient attended group without difficulty Degree of Participation Active Behavior Alert, Appropriate, Calm and Cooperative Speech/Thought Process Coherent Affect/Mood Stable Insight Fair Judgment Fair Response to Interventions Engaged, Interested and Receptive Individualization Patient participated in the task and discussion. Patient was focused, calm and cooperative. Patient talked about how she feels rundown. Patient talked about how she needs to build her support system. Patient did not express any SI/HI thought, plan or intent.  Plan Continue per Plan of Care / Interdisciplinary Plan of Care

## 2019-05-13 NOTE — Group Note
Group Session:  Goal Setting / Treatment PlanningGroup Start Time:   1315      End Time:  1400Facilitators:  Adrian Saran, CTRSSession Focus Engagement in treatmentSymptom management Number of Participants 8 Treatment Modality Therapeutic Recreation Interventions/Education Encouraged acceptance, tolerance and respect for othersEncouraged appropriate expression of thoughts and feelingsEncouraged patient to increase focus on treatment goalsEncouraged patient to increase participation in treatmentEncouraged progress toward achieving treatment goalHelped decrease isolation/increase sense of belongingHelped identify feelings, thoughts and behaviors connectionHelped patient establish a treatment-related goalHelped patient identify ways to adaptively cope with symptomsHelped patient increase self-awareness Attendance            Partial attendance due to late arrival - other.  Total number of minutes attended:  35Engagement InterventionsNot applicable - patient attended group without difficulty Degree of Participation Active Behavior Alert and Cooperative Speech/Thought Process Focused Affect/Mood Euthymic and Stable Insight Fair Judgment Fair Response to Interventions Attentive, Engaged and Receptive Individualization Patient presented as focused, interactive and engaged in a written exercise and group discussion.  She shared a goal to work on managing her anger and take medications to help manage symptoms.  She initiated conversations with others and brightened slightly at times.  She did not express any SI/HI. Plan Continue to assist with symptom managementContinue to engage in treatmentContinue per Plan of Care / Interdisciplinary Plan of Care

## 2019-05-13 NOTE — Progress Notes
Individualized Treatment Group Schedule3/4/2021Treatment Location:  [facility] - [employer] [facility] : [employer], Designer, television/film set, Pharmacologist, Understanding Your Illness and Wrap-up GroupTuesday: [employer], Art Therapy, OT Group - Life Skills, Control and instrumentation engineer and Wrap-up GroupWednesday: [employer], Primary school teacher Therapy, Movement/Exercise, Building control surveyor, Understanding Your Illness and Wrap-up GroupThursday: [employer], Art Therapy, Goal Setting, OT Group - Movement, Understanding Your Illness and Wrap-up GroupFriday: [employer], Chief of Staff of Life, Music Meditation and Wrap-up GroupSaturday: [employer], Music Listening and Wrap-up GroupSunday: [employer], Recreational Therapy, Wrap-up Group and Medication Adherence

## 2019-05-13 NOTE — Plan of Care
Plan of Care Overview/ Patient Status    You guys had me on all these meds last time to help with my depression. I really think I need the Effexor. Medication education provided. Patient also encouraged to speak to provider about scheduled medications. Isolative to room. Encouraged to spend time in the milieu. Attended groups throughout the rest of the shift. Completed MDQ. Patient reported anxiety. PRN Atarax 25 mg PO received at 1315, moderate effect. States she feels as if the providers are not taking her seriously because she was seen by a student today. PA made aware. Denied suicidal/homicidal ideations and auditory/visual hallucinations. Eating meals throughout the day. Q15 minute safety checks continued.Problem: Adult Inpatient Plan of CareGoal: Plan of Care ReviewOutcome: Interventions implemented as appropriateGoal: Patient-Specific Goal (Individualized)Outcome: Interventions implemented as appropriateGoal: Absence of Hospital-Acquired Illness or InjuryOutcome: Interventions implemented as appropriateGoal: Optimal Comfort and WellbeingOutcome: Interventions implemented as appropriateGoal: Readiness for Transition of CareOutcome: Interventions implemented as appropriate Problem: Adult Behavioral Health Plan of CareGoal: Plan of Care ReviewOutcome: Interventions implemented as appropriateGoal: Patient-Specific Goal (Individualization)Outcome: Interventions implemented as appropriateGoal: Adheres to Safety Considerations for Self and OthersOutcome: Interventions implemented as appropriateGoal: Absence of New-Onset Illness or InjuryOutcome: Interventions implemented as appropriateGoal: Optimized Coping Skills in Response to Life StressorsOutcome: Interventions implemented as appropriateGoal: Develops/Participates in Biomedical scientist to Support Successful TransitionOutcome: Interventions implemented as appropriate Problem: Psychiatry Goal & Intervention PlanGoal: AggressionDescription: Signs and symptoms will be absent or manageable by discharge/transition of careOutcome: Interventions implemented as appropriateNote: Interventions from the Interdisciplinary Treatment Plan (SPOC)1.  No value filed. No value filed.2.  No value filed. No value filed.3.  No value filed. No value filed.4.  No value filed. No value filed.5.  No value filed. No value filed.Goal: Mood AlterationDescription: Signs and symptoms will be absent or manageable by discharge/transition of careOutcome: Interventions implemented as appropriateNote: Interventions from the Interdisciplinary Treatment Plan (SPOC)1.  Provide counseling and emotional support Q shift2.  Provide a safe environment Q shift3.  Encourage healthy eating and sleeping patterns and self-care activities Q shift4.  No value filed. No value filed.5.  No value filed. No value filed.Goal: Suicide RiskDescription: Signs and symptoms will be absent or manageable by discharge/transition of careOutcome: Interventions implemented as appropriateNote: Interventions from the Interdisciplinary Treatment Plan (SPOC)1.  Provide suicide prevention plan Prior to discharge2.  Provide safe environment Q shift3.  Promote and collaborate with support system Prior to discharge4.  No value filed. No value filed.5.  No value filed. No value filed.

## 2019-05-13 NOTE — Plan of Care
[facility]		Spiritual Care NotePurpose: Referral Source: Nurse Observation: People present/Information Obtained From: Patient Emotional Mood: Sad, Calm Quality of Relational Support: Unable to Assess Types of Relational Support: Family   Subjective: Consult from nursing. Patient appeared calm. Introduction of self and chaplain role. Patient welcomed chaplain visit. Patient shared that she was feeling sad. Patient reflected on family relationships and expressed feeling stressed about housing. Patient expressed hope that her boyfriend will visit her. Patient shared that her religion is [place]. Patient identified prayer as source of strength. Patient shared that she wishes to focus on the positives to be strong. Patient requested prayer for her and her son to have a comfortable home to live. Offered customized prayer. Patient expressed gratitude for prayer. Spiritual Assessment:Referral Source: Nurse Information Obtained From: Patient Mood: Sad, CalmRelational SupportTypes of Relational Support: Science writer of Relational Support: Unable to Assess Religious Affiliation: Ephriam Knuckles  Spiritual Resources: Faith, God - Relationship, Prayer, Family, Growing/Developing FaithHelpful Religious Practices: Surveyor, minerals, Shared Prayer    Issues RaisedRaised by Patient: Adjustment to Illness, Concerns about Family   Spiritual Interventions:Spiritual Intervention Index**Date of Spiritual Visit: 03/03/21Visit Type: ConsultLanguage or special accommodation rendered?: NoIntervention Type: Spiritual VisitResponding Chaplain: Resident, On-Call ChaplainReligious Needs: PrayerSpiritual/Religious Support Provided: Introduction to [employer], Companionship, Spiritual Support, PrayerFollow-Up Visit Needed: NoOutcome: OUTCOMES: Expressed Spiritual/Religious Resources or Distress: Utilized spiritual resources/practices OUTCOMES: Expressed Emotional Resources or Distress: Emotions Expressed/Distress Reduced         Plan: Spiritual care is available at patient / family request.    Total Consult Time: 20 minutes	Signed: Simona Huh MagerChaplain [phone] 3/3/20218:38 PMPlan of Care Overview/ Patient Status

## 2019-05-13 NOTE — Plan of Care
Plan of Care Overview/ Patient Status    Pt observed withdrawn to room entire shift. I just feel so tired. Mood is dysphoric/anxious, affect is blunted/flat. Noted to be dehydrated. Reporting low appetite. Fluids and food encouraged. Blood work and Utox obtained. Utox+ cannabis. Still reporting nausea. PRN zofran 4mg  PO given @ 1709, good effect. Later on, pt noted to be crying in room. I had no visitors today.. they're all mad at me! Emotional support provided. PRN atarax 25mg  given @ 1846, poor effect. Retrieved cell phone from security for pt to get phone numbers. Pt remains tearful and anxious. Scheduled medications given. Labile on phone with boyfriend, I have trust issues.Marland Kitchen do you think it'll ever get better? Denies suicidal/homicidal ideation and auditory/visual hallucinations. Will continue to monitor Q15 min for pt safety.Problem: Adult Inpatient Plan of CareGoal: Plan of Care ReviewOutcome: Interventions implemented as appropriateGoal: Patient-Specific Goal (Individualized)Outcome: Interventions implemented as appropriateGoal: Absence of Hospital-Acquired Illness or InjuryOutcome: Interventions implemented as appropriateGoal: Optimal Comfort and WellbeingOutcome: Interventions implemented as appropriateGoal: Readiness for Transition of CareOutcome: Interventions implemented as appropriate Problem: Adult Behavioral Health Plan of CareGoal: Plan of Care ReviewOutcome: Interventions implemented as appropriateGoal: Patient-Specific Goal (Individualization)Outcome: Interventions implemented as appropriateGoal: Adheres to Safety Considerations for Self and OthersOutcome: Interventions implemented as appropriateGoal: Absence of New-Onset Illness or InjuryOutcome: Interventions implemented as appropriateGoal: Optimized Coping Skills in Response to Life StressorsOutcome: Interventions implemented as appropriateGoal: Develops/Participates in Biomedical scientist to Support Successful TransitionOutcome: Interventions implemented as appropriate Problem: Psychiatry Goal & Intervention PlanGoal: AggressionDescription: Signs and symptoms will be absent or manageable by discharge/transition of careOutcome: Interventions implemented as appropriateNote: Interventions from the Interdisciplinary Treatment Plan (SPOC)1.  No value filed. No value filed.2.  No value filed. No value filed.3.  No value filed. No value filed.4.  No value filed. No value filed.5.  No value filed. No value filed.Goal: Mood AlterationDescription: Signs and symptoms will be absent or manageable by discharge/transition of careOutcome: Interventions implemented as appropriateNote: Interventions from the Interdisciplinary Treatment Plan (SPOC)1.  Provide counseling and emotional support Q shift2.  Provide a safe environment Q shift3.  Encourage healthy eating and sleeping patterns and self-care activities Q shift4.  No value filed. No value filed.5.  No value filed. No value filed.Goal: Suicide RiskDescription: Signs and symptoms will be absent or manageable by discharge/transition of careOutcome: Interventions implemented as appropriateNote: Interventions from the Interdisciplinary Treatment Plan (SPOC)1.  Provide suicide prevention plan Prior to discharge2.  Provide safe environment Q shift3.  Promote and collaborate with support system Prior to discharge4.  No value filed. No value filed.5.  No value filed. No value filed.

## 2019-05-13 NOTE — Plan of Care
Pt awake overnight and reported racing thoughts. Administered Seroquel 100mg  at 0249, with good effect. Problem: Adult Inpatient Plan of CareGoal: Plan of Care ReviewOutcome: Interventions implemented as appropriateGoal: Patient-Specific Goal (Individualized)Outcome: Interventions implemented as appropriateGoal: Absence of Hospital-Acquired Illness or InjuryOutcome: Interventions implemented as appropriateGoal: Optimal Comfort and WellbeingOutcome: Interventions implemented as appropriateGoal: Readiness for Transition of CareOutcome: Interventions implemented as appropriate Problem: Adult Behavioral Health Plan of CareGoal: Plan of Care ReviewOutcome: Interventions implemented as appropriateGoal: Patient-Specific Goal (Individualization)Outcome: Interventions implemented as appropriateGoal: Adheres to Safety Considerations for Self and OthersOutcome: Interventions implemented as appropriateGoal: Absence of New-Onset Illness or InjuryOutcome: Interventions implemented as appropriateGoal: Optimized Coping Skills in Response to Life StressorsOutcome: Interventions implemented as appropriateGoal: Develops/Participates in Biomedical scientist to Support Successful TransitionOutcome: Interventions implemented as appropriate Problem: Psychiatry Goal & Intervention PlanGoal: AggressionDescription: Signs and symptoms will be absent or manageable by discharge/transition of careOutcome: Interventions implemented as appropriateNote: Interventions from the Interdisciplinary Treatment Plan (SPOC)1.  No value filed. No value filed.2.  No value filed. No value filed.3.  No value filed. No value filed.4.  No value filed. No value filed.5.  No value filed. No value filed.Goal: Mood AlterationDescription: Signs and symptoms will be absent or manageable by discharge/transition of careOutcome: Interventions implemented as appropriateNote: Interventions from the Interdisciplinary Treatment Plan (SPOC)1.  Provide counseling and emotional support Q shift2.  Provide a safe environment Q shift3.  Encourage healthy eating and sleeping patterns and self-care activities Q shift4.  No value filed. No value filed.5.  No value filed. No value filed.Goal: Suicide RiskDescription: Signs and symptoms will be absent or manageable by discharge/transition of careOutcome: Interventions implemented as appropriateNote: Interventions from the Interdisciplinary Treatment Plan (SPOC)1.  Provide suicide prevention plan Prior to discharge2.  Provide safe environment Q shift3.  Promote and collaborate with support system Prior to discharge4.  No value filed. No value filed.5.  No value filed. No value filed. Plan of Care Overview/ Patient Status

## 2019-05-13 NOTE — Group Note
Group Session:  Stretch and FlexGroup Start Time:   1230      End Time:  1301Facilitators:  Fletcher Anon, OTSession Focus Healthy lifestyle management Number of Participants 7 Treatment Modality Occupational Therapy Interventions/Education Facilitated Movement ActivityHelped patient identify interestsImprove ability to use physical activity to attain and maintain optimal balance and wellness Attendance            AttendedEngagement InterventionsPatient encouraged to use coping skills Degree of Participation Moderate Behavior Appropriate and Interactive Speech/Thought Process Coherent, Focused and Relevant Affect/Mood Stable Insight Moderate Judgment Moderate Response to Interventions Engaged, Interested and Receptive Individualization 1301 Movement Group. Pt attended 100% of group with full participation. Pt appeared motivated and stated enjoyment from activity. No SI/HI noted in group. Plan Continue to engage in treatment

## 2019-05-14 ENCOUNTER — Encounter: Admit: 2019-05-14 | Payer: PRIVATE HEALTH INSURANCE | Primary: Cardiovascular Disease

## 2019-05-14 LAB — SARS COV-2 (COVID-19) RNA: BKR SARS-COV-2 RNA (COVID-19) (YH): NEGATIVE

## 2019-05-14 MED ORDER — QUETIAPINE IMMEDIATE RELEASE 50 MG TABLET
50 mg | Freq: Two times a day (BID) | ORAL | Status: DC
Start: 2019-05-14 — End: 2019-05-17
  Administered 2019-05-15 – 2019-05-16 (×4): 50 mg via ORAL

## 2019-05-14 NOTE — Plan of Care
Plan of Care Overview/ Patient Status    Patient continuing to quarantine on isolation precautions due to COVID-19 exposure. Patient observed to sleep throughout the night without event. Q15 minute checks maintained for safety. Problem: Adult Inpatient Plan of CareGoal: Plan of Care ReviewOutcome: Interventions implemented as appropriateGoal: Patient-Specific Goal (Individualized)Outcome: Interventions implemented as appropriateGoal: Absence of Hospital-Acquired Illness or InjuryOutcome: Interventions implemented as appropriateGoal: Optimal Comfort and WellbeingOutcome: Interventions implemented as appropriateGoal: Readiness for Transition of CareOutcome: Interventions implemented as appropriate Problem: Adult Behavioral Health Plan of CareGoal: Plan of Care ReviewOutcome: Interventions implemented as appropriateGoal: Patient-Specific Goal (Individualization)Outcome: Interventions implemented as appropriateGoal: Adheres to Safety Considerations for Self and OthersOutcome: Interventions implemented as appropriateGoal: Absence of New-Onset Illness or InjuryOutcome: Interventions implemented as appropriateGoal: Optimized Coping Skills in Response to Life StressorsOutcome: Interventions implemented as appropriateGoal: Develops/Participates in Biomedical scientist to Support Successful TransitionOutcome: Interventions implemented as appropriate Problem: Psychiatry Goal & Intervention PlanGoal: AggressionDescription: Signs and symptoms will be absent or manageable by discharge/transition of careOutcome: Interventions implemented as appropriateNote: Interventions from the Interdisciplinary Treatment Plan (SPOC)1.  No value filed. No value filed.2.  No value filed. No value filed.3.  No value filed. No value filed.4.  No value filed. No value filed.5.  No value filed. No value filed.Goal: Mood AlterationDescription: Signs and symptoms will be absent or manageable by discharge/transition of careOutcome: Interventions implemented as appropriateNote: Interventions from the Interdisciplinary Treatment Plan (SPOC)1.  Provide counseling and emotional support Q shift2.  Provide a safe environment Q shift3.  Encourage healthy eating and sleeping patterns and self-care activities Q shift4.  No value filed. No value filed.5.  No value filed. No value filed.Goal: Suicide RiskDescription: Signs and symptoms will be absent or manageable by discharge/transition of careOutcome: Interventions implemented as appropriateNote: Interventions from the Interdisciplinary Treatment Plan (SPOC)1.  Provide suicide prevention plan Prior to discharge2.  Provide safe environment Q shift3.  Promote and collaborate with support system Prior to discharge4.  No value filed. No value filed.5.  No value filed. No value filed. Problem: InfectionGoal: Infection Symptom ResolutionOutcome: Interventions implemented as appropriate

## 2019-05-14 NOTE — Plan of Care
Plan of Care Overview/ Patient Status    I'm just trying to focus on me right now! Mood is anxious/dysphoric, affect blunted/flat. Upon beginning of shift, pt reported heightened anxiety and that atarax did not work. PRN seroquel 50mg  given @ 1541, good effect. This Clinical research associate then informed by management that pt has been exposed to Covid while in ED. Immediately placed on Covid isolation precautions; switched to private room. Spoken to by Dr. Ashley Jacobs and this Clinical research associate. Pt verbalized understanding of having to do a 10 day quarantine and what that entails. Swabbed for Covid- resulted as negative. Dinner brought to room. Listening to the radio, coloring, and writing to remain occupied. I just feel so isolated. Emotional support provided. Denies suicidal/homicidal ideation and auditory/visual hallucinations. Will continue to monitor Q15 min for pt safety.Problem: Adult Inpatient Plan of CareGoal: Plan of Care ReviewOutcome: Interventions implemented as appropriateGoal: Patient-Specific Goal (Individualized)Outcome: Interventions implemented as appropriateGoal: Absence of Hospital-Acquired Illness or InjuryOutcome: Interventions implemented as appropriateGoal: Optimal Comfort and WellbeingOutcome: Interventions implemented as appropriateGoal: Readiness for Transition of CareOutcome: Interventions implemented as appropriate Problem: Adult Behavioral Health Plan of CareGoal: Plan of Care ReviewOutcome: Interventions implemented as appropriateGoal: Patient-Specific Goal (Individualization)Outcome: Interventions implemented as appropriateGoal: Adheres to Safety Considerations for Self and OthersOutcome: Interventions implemented as appropriateGoal: Absence of New-Onset Illness or InjuryOutcome: Interventions implemented as appropriateGoal: Optimized Coping Skills in Response to Life StressorsOutcome: Interventions implemented as appropriateGoal: Develops/Participates in Biomedical scientist to Support Successful TransitionOutcome: Interventions implemented as appropriate Problem: Psychiatry Goal & Intervention PlanGoal: AggressionDescription: Signs and symptoms will be absent or manageable by discharge/transition of careOutcome: Interventions implemented as appropriateNote: Interventions from the Interdisciplinary Treatment Plan (SPOC)1.  No value filed. No value filed.2.  No value filed. No value filed.3.  No value filed. No value filed.4.  No value filed. No value filed.5.  No value filed. No value filed.Goal: Mood AlterationDescription: Signs and symptoms will be absent or manageable by discharge/transition of careOutcome: Interventions implemented as appropriateNote: Interventions from the Interdisciplinary Treatment Plan (SPOC)1.  Provide counseling and emotional support Q shift2.  Provide a safe environment Q shift3.  Encourage healthy eating and sleeping patterns and self-care activities Q shift4.  No value filed. No value filed.5.  No value filed. No value filed.Goal: Suicide RiskDescription: Signs and symptoms will be absent or manageable by discharge/transition of careOutcome: Interventions implemented as appropriateNote: Interventions from the Interdisciplinary Treatment Plan (SPOC)1.  Provide suicide prevention plan Prior to discharge2.  Provide safe environment Q shift3.  Promote and collaborate with support system Prior to discharge4.  No value filed. No value filed.5.  No value filed. No value filed. Problem: InfectionGoal: Infection Symptom ResolutionOutcome: Interventions implemented as appropriate

## 2019-05-14 NOTE — Plan of Care
Plan of Care Overview/ Patient Status    You guys are just keeping me here for money. Patient withdrawn to room due to Covid isolation. Irritable, wide, anxious mood. Patient angry due to isolation and being in the hospital. Requesting to be discharged while cursing and pacing room frantically. Offered and accepted PRN Seroquel 100 mg at 0849, positive effect. Patient then later apologized for behavior. Requesting a change in medications and access to her phone in order to complete assignments on [employer]. Team made aware. Patient then observed sleeping. Reports poor appetite and ate 25% breakfast. Provider and writer entered patient's room to discuss plan for remainder of hospitalization. Patient began cursing and accusing staff of not taking care of her and not checking up on her throughout the day. Patient threatened to sue staff and the hospital and threw water pitcher and papers during interaction. Signed 3 day paper at 1645, team aware.Patient had difficulty processing information regarding Covid isolation. Denies suicidal/homicidal ideations. Patient is free from gestures of self harm. Denies auditory/visual hallucinations. Patient does not appear preoccupied. Q 15 minute checks continued to ensure patient safety.Problem: Adult Inpatient Plan of CareGoal: Plan of Care ReviewOutcome: Interventions implemented as appropriateGoal: Patient-Specific Goal (Individualized)Outcome: Interventions implemented as appropriateGoal: Absence of Hospital-Acquired Illness or InjuryOutcome: Interventions implemented as appropriateGoal: Optimal Comfort and WellbeingOutcome: Interventions implemented as appropriateGoal: Readiness for Transition of CareOutcome: Interventions implemented as appropriate Problem: Adult Behavioral Health Plan of CareGoal: Plan of Care ReviewOutcome: Interventions implemented as appropriateGoal: Patient-Specific Goal (Individualization)Outcome: Interventions implemented as appropriateGoal: Adheres to Safety Considerations for Self and OthersOutcome: Interventions implemented as appropriateGoal: Absence of New-Onset Illness or InjuryOutcome: Interventions implemented as appropriateGoal: Optimized Coping Skills in Response to Life StressorsOutcome: Interventions implemented as appropriateGoal: Develops/Participates in Biomedical scientist to Support Successful TransitionOutcome: Interventions implemented as appropriate Problem: Psychiatry Goal & Intervention PlanGoal: AggressionDescription: Signs and symptoms will be absent or manageable by discharge/transition of careOutcome: Interventions implemented as appropriateNote: Interventions from the Interdisciplinary Treatment Plan (SPOC)1.  No value filed. No value filed.2.  No value filed. No value filed.3.  No value filed. No value filed.4.  No value filed. No value filed.5.  No value filed. No value filed.Goal: Mood AlterationDescription: Signs and symptoms will be absent or manageable by discharge/transition of careOutcome: Interventions implemented as appropriateNote: Interventions from the Interdisciplinary Treatment Plan (SPOC)1.  Provide counseling and emotional support Q shift2.  Provide a safe environment Q shift3.  Encourage healthy eating and sleeping patterns and self-care activities Q shift4.  No value filed. No value filed.5.  No value filed. No value filed.Goal: Suicide RiskDescription: Signs and symptoms will be absent or manageable by discharge/transition of careOutcome: Interventions implemented as appropriateNote: Interventions from the Interdisciplinary Treatment Plan (SPOC)1.  Provide suicide prevention plan Prior to discharge2.  Provide safe environment Q shift3.  Promote and collaborate with support system Prior to discharge4.  No value filed. No value filed.5.  No value filed. No value filed. Problem: InfectionGoal: Infection Symptom ResolutionOutcome: Interventions implemented as appropriate

## 2019-05-14 NOTE — Progress Notes
[facility]	Inpatient Progress NoteAttending Provider: Mina Marble, MD - Length of Stay (Days): 2Legal Admission Status: Voluntary Psychiatric Subjective: Interim History: Patient was seen and examined. Case reviewed with staff and chart reviewed. CC: not good at allPt interviewed in her room, sitting on her bed. Pt states that she does not feel good at all. Pt then showed me a hand-written letter expressing her concerns and demonstrating how she feels that no one is taking care of me. Pt reported that she was in bed for 48 hours but no one came to encourage me to get out of bed or get breakfast. She feels so sad and depressed and don't want to do nothing. She is worried because she goes back and forth... all over the place when she is at home and performing different activities, often forgetful. Pt states that she feels numb everyday.. I wish I was someone else. Reports difficulty sleeping, as she woke up at 3am with racing thoughts and a lot of ideas. She states that Seroquel is like water and is not helping her anxiety or depression. Endorses lack of appetite, as nothing interests her to eat. When asked if the food itself bothers her and if changing the food type woould help, she states that it doesn't matter what food it is. I force myself to eat a little but I just can't and I am still starving. Reports vomiting yesterday after taking Effexor which has happened in the past. No SI/HI/AVH. She states that group therapy has been fun. Review of Allergies/Meds/Hx: I have reviewed the patient's current medications, allergies and past medical history.Current Medications:Scheduled Medications:Current Facility-Administered Medications Medication Dose Route Frequency Provider Last Rate Last Admin ? benztropine (COGENTIN) tablet 1 mg  1 mg Oral Q4H PRN Greig Castilla, PA     ? diphenhydrAMINE (BENADRYL) injection 50 mg 50 mg Intramuscular Q4H PRN Greig Castilla, PA     ? hydrOXYzine (ATARAX) tablet 25 mg  25 mg Oral Q4H PRN Greig Castilla, PA   25 mg at 05/12/19 1846 ? ibuprofen (ADVIL,MOTRIN) tablet 400 mg  400 mg Oral Q4H PRN Greig Castilla, PA     ? ibuprofen (ADVIL,MOTRIN) tablet 600 mg  600 mg Oral Q6H PRN Greig Castilla, PA     ? ibuprofen (ADVIL,MOTRIN) tablet 800 mg  800 mg Oral Q6H PRN Greig Castilla, PA     ? melatonin tablet 3 mg  3 mg Oral Nightly Greig Castilla, PA   3 mg at 05/12/19 2021 ? OLANZapine (ZyPREXA) 10 mg in water for injection, sterile (5 mg/mL) IM vial  10 mg Intramuscular TID PRN Greig Castilla, PA     ? ondansetron (ZOFRAN-ODT) disintegrating tablet 4 mg  4 mg Translingual Q8H PRN Vozzella, Dominic, PA   4 mg at 05/12/19 1709 ? polyethylene glycol (MIRALAX) packet 17 g  17 g Oral Nightly PRN Greig Castilla, PA     ? QUEtiapine (SEROquel) Immediate Release tablet 100 mg  100 mg Oral Q4H PRN Greig Castilla, PA   100 mg at 05/13/19 0249 ? QUEtiapine (SEROquel) Immediate Release tablet 200 mg  200 mg Oral Nightly Vozzella, Dominic, PA     ? QUEtiapine (SEROquel) Immediate Release tablet 50 mg  50 mg Oral Q4H PRN Greig Castilla, PA   50 mg at 05/12/19 2021 ? senna-docusate (SENNA-PLUS) 8.6-50 mg per tablet 1 tablet  1 tablet Oral DAILY PRN Greig Castilla, PA     ? traZODone (DESYREL) tablet 50 mg  50 mg Oral Nightly PRN Greig Castilla,  PA     PRN Medications:benztropine, diphenhydrAMINE, hydrOXYzine, ibuprofen, ibuprofen, ibuprofen, OLANZapine (ZyPREXA) IM injection, ondansetron, polyethylene glycol, QUEtiapine, QUEtiapine, senna-docusate, traZODoneObjective: Vitals:Most recent: Patient Vitals for the past 24 hrs: BP Temp Temp src Pulse Resp SpO2 05/13/19 0537 128/81 98.7 ?F (37.1 ?C) Oral (!) 52 16 97 % 05/12/19 1954 127/80 98.7 ?F (37.1 ?C) Oral 75 18 97 % Labs:Last 24 hours: Recent Results (from the past 24 hour(s)) Hemoglobin A1c  Collection Time: 05/12/19  4:44 PM Result Value Ref Range  Hemoglobin A1c 5.1 4.0 - 5.6 %  Estimated Average Glucose mg/dL 756 mg/dL Vitamin E33  Collection Time: 05/12/19  4:44 PM Result Value Ref Range  Vitamin B12 659 232-1,245 pg/mL Toxicology screen, urine     ([facility])  Collection Time: 05/12/19  5:12 PM Result Value Ref Range  Alcohol Urine Negative Negative  Amphetamine Screen, Urine Negative Negative  Benzodiazepine Screen, Urine Negative Negative  Cannabinoid Screen, Urine Positive (A) Negative  Opiate Screen, Urine Negative Negative  6-Acetylmorphine Negative Negative  Oxycodone Screen, Urine Negative Negative  Phencyclidine Screen, Urine Negative Negative  Cocaine Metabolites, Ur Negative Negative  Methadone Metabolite Screen, Urine, No Conf. Negative Negative  Barbiturate Screen, Urine Negative Negative  Propoxyphene Negative Negative  Drugs Of Abuse Note See Comment  Review of Systems Constitutional: Negative.  HENT: Negative.  Eyes: Negative.  Respiratory: Negative.  Cardiovascular: Negative.  Gastrointestinal: Negative.  Genitourinary: Negative.  Musculoskeletal: Negative.  Neurological: Negative.  Psychiatric/Behavioral: Positive for decreased concentration, dysphoric mood and sleep disturbance. Negative for agitation, confusion, hallucinations, self-injury and suicidal ideas. The patient is nervous/anxious.  Mental Status Exam:General AppearanceHabitus:  ThinGrooming:  GoodMusculoskeletalStrength and Tone: Strength normalGait and Station: Stable gait and stable posturePsychiatricAttitude: Cooperative and good eye contactPsychomotor Behavior: No psychomotor activation or retardationSpeech: normal rate, volume and prosody   Patient reported mood:  iffyAffect: anxious, dysphoricThought Process: Coherent, logical, goal directedAssociations: NormalThought Content: no auditory hallucinations, no visual hallucinationsSuicidal Ideation: no current suicidal ideation, no current suicidal intent, no planHomicidal Ideation: No homicidal ideation, no planJudgment: limited.Insight: limited.Cognitive EvaluationOrientation: Oriented to date/time, oriented to place and oriented to person Memory: Recent and remote memory intactFund of Knowledge:  LimitedAbstract Reasoning: Decreased capacity for abstract reasoningAssessment: Hospital Problem List:Active Hospital Problems  Diagnosis ? Principal Problem/Diagnosis:  Other bipolar disorder (HC Code) [F31.89] ? 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 Homelessness [Z59.0] ? MDD (major depressive disorder), recurrent severe, without psychosis (HC Code) [F33.2] Assessment:Zoe Scott is a 24 y.o. female with history of MDD and prior OD and self-cutting behavior who presented with suicidal ideations and anxiety. Today, patient appears to be reporting racing thoughts and labile mood. At this time, the patient continues to warrant inpatient psychiatric hospitalization for safety, stabilization, close monitoring, diagnostic clarity and aftercare planning.Condition:UnchangedPlan: Level of Observation: ?	Monitor for safety at current level of observation?	Cont q15 minute checks for safety Psychotherapeutic/Milieu Interventions: ?	Progress hospital mobility status as tolerated, with movement toward discharge?	Participation in groups and individual psychotherapy, milieu therapy Psychopharmacologic Interventions: ?	Continue Seroquel 200mg  nightly to target anxiety and psychosis. ?	Start Lithium 450 mg BID for unstable mood and as adjunctive treatment for major depressive disorder?	Monitor symptoms and side effects of medicationsDisposition/Family: ?	Will work with SW to obtain collateral from family and outpatient providersSigned:Elizabeth Togneri, STUDENT3/4/20211:05 PM Supervising Physician: Dr. Ashley Jacobs The documentation recorded by the PA student accurately reflects the service I personally provided and performed and the clinical decisions were made by me, Mina Marble, MD, on 05/13/2019. No evidence of efficacy for Effexor  so I will discontinue the medication. Trial of Seroquel and a mood stabilizer, Lithium, for augmentation. Use Ambien for insomnia.Patient placed on isolation precaution. Quarantine exposed COVID as reason for precaution.Total time spent face-to-face with patient, reviewing record, and coordinating care:  greater than 45 minutes spent face to face with patient and coordinating care - High Complexity Medical Decision Making - [phone].

## 2019-05-14 NOTE — Plan of Care
Plan of Care Overview/ Patient Status    Past Medical History: Diagnosis Date ? Anxiety  ? Depression  ? Strep throat  ? Suicide attempt [facility] Code)   April 2020: Ingestion of pills and cut wrists Past Surgical History: Procedure Laterality Date ? DENTAL SURGERY   ? HERNIA REPAIR Right age 24  RLQ area ? HERNIA REPAIR   ? TONSILLECTOMY Bilateral 2013 ? TONSILLECTOMY    Medication:I have reviewed the patient's current meds as listed in the patient's EMR., SCHEDULED: Current Facility-Administered Medications Medication Dose Route Frequency Provider Last Rate Last Admin ? lithium (ESKALITH) extended release tablet 450 mg  450 mg Oral BID WC Mina Marble, MD   450 mg at 05/14/19 3664 ? QUEtiapine (SEROquel) Immediate Release tablet 200 mg  200 mg Oral Nightly Vozzella, Dominic, PA   200 mg at 05/13/19 2130 ? zolpidem (AMBIEN) tablet 5 mg  5 mg Oral Nightly Mina Marble, MD   5 mg at 05/13/19 2130 Nutritional AssessmentTimepoint: AssessmentReason For Assessment: identified at risk by screening criteriaIdentified At Risk by Screening Criteria: other (see comments)(nutrition supplements ordered)Patient Reported Diet/Restrictions/Preferences: other (see comments)(UTA)Current StateCurrent Appetite: poorSpecialty Diet/Nutrition Received: regularDiet/Feeding Assistance: other (see comments)(safety tray)Diet/Feeding Tolerance: poorNutrition Interventions: other (see comments), supplemental drinks providedNutrition Risk Screen: no indicators presentAnthropometric MeasurementsHeight: 5' 4 (162.6 cm) Weight: 62.6 kg Weight Method: Actual Weight Scale Used: Standing  BMI (Calculated): 23.7Ideal Body Weight (IBW) (kg): 55 Usual weight: 62.6 kg-CBWAdjusted Body Weight: 115% IBW 54.5 kgWeight Change: -3.2 kg x 5 monthsBMI Assessment: BMI 18.5-24.9: normal Nutrition Focused Physical FindingsAdditional Documentation: Physical Appearance (Group)(UTA covid isolation)Overall Physical Appearance: other (see comments)(adequate weight)Skin: other (see comments)(skin intact)Estimated Nutritional NeedsTotal Energy Estimated Needs: [phone] kcals/dayMethod for Estimating Needs: 25-30 kcals/kg-CBW 62.6 kgTotal Protein Estimated Needs: 50-63 g/dayMethod for Estimating Needs: 0.8-1 g/kg-CBW 62.6 kgTotal Fluid Estimated Needs: 9563-8756 mL/dayMethod for Estimating Needs: 1 mL/kcalNutrition OrderNutrition Order: meets nutritional requirementsNutrition RiskDate of Last RD visit: 03/05/21Nutrition Risk: patient at nutritional riskLevel of Risk: highNutrition DiagnosisNutrition Diagnosis/Problem: Inadequate oral intakeRelated to: alterations in appetiteAs Evidenced by: RN reportNutrition Recommendations other (see comments), supplemental drinks providedAssessment/PlanPt assessed for nutrition status as nutrition supplements were ordered. Per EMR review pt had an episode of vomitting after taking Effexor & med discontinued. Spoke with RN who reported poor appetite; pt ate approx. 25% of breakfast, but has been drinking the Ensures well. Suggest continue with Ensure Plus TID until appetite improves. Labs reviewed. Meds noted: Lithium.RECOMMENDATIONS/INTERVENTIONS:-Continue current diet as ordered-Ensure Plus TID-Encourage PO, provide food preferences, assist PO as needed-Request weekly weightsGOALS: -Patient will consume >75% of needs-Patient will consume >75% of nutrition supplement-Patient will preserve lean body mass and skin integrityMONITORING / EVALUATION:- Will monitor 	Weight and weight change	Diet Order and Food Intake	Glucose profile/Labs	Gastrointestinal	Mertie Clause RD, CD-NMHB [phone]/07/2019 4:48 PM

## 2019-05-15 ENCOUNTER — Ambulatory Visit: Admit: 2019-05-15 | Payer: PRIVATE HEALTH INSURANCE | Primary: Cardiovascular Disease

## 2019-05-15 LAB — URINE MICROSCOPIC     (BH GH LMW YH)
BKR HYALINE CASTS, UA INSTRUMENT (NUMERIC): 1 /LPF (ref 0–3)
BKR RBC/HPF INSTRUMENT: 1 /HPF (ref 0–2)
BKR WBC/HPF INSTRUMENT: 5 /HPF (ref 0–5)

## 2019-05-15 LAB — URINALYSIS WITH CULTURE REFLEX      (BH LMW YH)
BKR BILIRUBIN, UA: NEGATIVE
BKR BLOOD, UA: NEGATIVE
BKR GLUCOSE, UA: NEGATIVE
BKR KETONES, UA: NEGATIVE
BKR LEUKOCYTE ESTERASE, UA: POSITIVE — AB
BKR NITRITE, UA: NEGATIVE
BKR PH, UA: 7 (ref 5.5–7.5)
BKR PROTEIN, UA: NEGATIVE
BKR SPECIFIC GRAVITY, UA: 1.016 (ref 1.005–1.030)
BKR UROBILINOGEN, UA: <2 EU/dL (ref <=2.0)

## 2019-05-15 NOTE — Plan of Care
Problem: Adult Inpatient Plan of CareGoal: Plan of Care ReviewOutcome: Interventions implemented as appropriateGoal: Patient-Specific Goal (Individualized)Outcome: Interventions implemented as appropriateGoal: Absence of Hospital-Acquired Illness or InjuryOutcome: Interventions implemented as appropriateGoal: Optimal Comfort and WellbeingOutcome: Interventions implemented as appropriateGoal: Readiness for Transition of CareOutcome: Interventions implemented as appropriate Problem: Adult Behavioral Health Plan of CareGoal: Plan of Care ReviewOutcome: Interventions implemented as appropriateGoal: Patient-Specific Goal (Individualization)Outcome: Interventions implemented as appropriateGoal: Adheres to Safety Considerations for Self and OthersOutcome: Interventions implemented as appropriateGoal: Absence of New-Onset Illness or InjuryOutcome: Interventions implemented as appropriateGoal: Optimized Coping Skills in Response to Life StressorsOutcome: Interventions implemented as appropriateGoal: Develops/Participates in Biomedical scientist to Support Successful TransitionOutcome: Interventions implemented as appropriate Problem: Psychiatry Goal & Intervention PlanGoal: AggressionDescription: Signs and symptoms will be absent or manageable by discharge/transition of careOutcome: Interventions implemented as appropriateNote: Interventions from the Interdisciplinary Treatment Plan (SPOC)1.  No value filed. No value filed.2.  No value filed. No value filed.3.  No value filed. No value filed.4.  No value filed. No value filed.5.  No value filed. No value filed.Goal: Mood AlterationDescription: Signs and symptoms will be absent or manageable by discharge/transition of careOutcome: Interventions implemented as appropriateNote: Interventions from the Interdisciplinary Treatment Plan (SPOC)1.  Facilitate goal setting Throughout hospitalization2. Provide counseling and emotional support As needed3.  Encourage healthy eating and sleeping patterns and self-care activities Throughout hospitalization4.  No value filed. No value filed.5.  No value filed. No value filed.Goal: Suicide RiskDescription: Signs and symptoms will be absent or manageable by discharge/transition of careOutcome: Interventions implemented as appropriateNote: Interventions from the Interdisciplinary Treatment Plan (SPOC)1.  Promote Identification of triggers Prior to discharge2.  Provide safe environment Throughout hospitalization3.  Encourage identification/practice/mastery of adaptive coping skills Q shift4.  No value filed. No value filed.5.  No value filed. No value filed. Problem: InfectionGoal: Infection Symptom ResolutionOutcome: Interventions implemented as appropriate Plan of Care Overview/ Patient Status    Slept without events this shift. Remains on q65min checks for safety. Patient awaken expressing anger due to being on isolation. Noted to be punching the on walls in room and punching the bed. Patient stating I want to leave. With much support patient agreed to take Zyprexa 10 mg prn IM at 0707. Please note patient right hand is swollen. On call placed order was to get an x-ray of the right hand.

## 2019-05-15 NOTE — Progress Notes
[facility]	Inpatient Progress NoteAttending Provider: Mina Marble, MD - Length of Stay (Days): 4Legal Admission Status: Voluntary Psychiatric Subjective: Interim History: Patient was seen and examined. Case reviewed with staff and chart reviewed. I am going to lose it, I will come out of my room and start hurting people or myselfPatient makes above statement when writer assessed for pt's response to recent quarantine to covid exposer. Education provided on logistical aspects of her quarantine. Pt states Its unfair I am like a trapped dog. Attempted to redirect pt to assess for positive outcomes of her treatment thus far I don't want to talk about how I am feeling, I can't even think about that right now. Pt states Yeah I am suicidal! Look at me!. When writer asks clarifying questions to address intent and plan Well if I wasn't in here, if I was home, I would be fine! Let me go I came here voluntarily. It should be noted that patient punched a wall in her room this morning, XRAY ordered, negative study. Patient currently endorses SI in her immediate environment with no specific plan although denies SI in the community, endorses threatening agressive behavior towards peers and staff on unit, denies auditory and visual hallucinations. Medication Adherent: YesPsychiatric PRNs Administered: Zyprexa 10 @ 0707 minimal effect.  Review of Allergies/Meds/Hx: I have reviewed the patient's current medications, allergies and past medical history.Current Medications:Scheduled Medications:Current Facility-Administered Medications Medication Dose Route Frequency Provider Last Rate Last Admin ? acetaminophen (TYLENOL) tablet 325 mg  325 mg Oral Q6H PRN Mina Marble, MD     ? acetaminophen (TYLENOL) tablet 650 mg  650 mg Oral Q6H PRN Mina Marble, MD     ? acetaminophen (TYLENOL) tablet 975 mg  975 mg Oral Q6H PRN Mina Marble, MD     ? benztropine (COGENTIN) tablet 1 mg  1 mg Oral Q4H PRN Greig Castilla, PA     ? diphenhydrAMINE (BENADRYL) injection 50 mg  50 mg Intramuscular Q4H PRN Greig Castilla, PA     ? hydrOXYzine (ATARAX) tablet 25 mg  25 mg Oral Q4H PRN Greig Castilla, PA   25 mg at 05/13/19 1315 ? lithium (ESKALITH) extended release tablet 450 mg  450 mg Oral BID WC Mina Marble, MD   450 mg at 05/15/19 6387 ? OLANZapine (ZyPREXA) 10 mg in water for injection, sterile (5 mg/mL) IM vial  10 mg Intramuscular TID PRN Mina Marble, MD   10 mg at 05/15/19 5643 ? ondansetron (ZOFRAN-ODT) disintegrating tablet 4 mg  4 mg Translingual Q8H PRN Eydie Wormley, PA   4 mg at 05/12/19 1709 ? polyethylene glycol (MIRALAX) packet 17 g  17 g Oral Nightly PRN Greig Castilla, PA     ? QUEtiapine (SEROquel) Immediate Release tablet 100 mg  100 mg Oral Q4H PRN Greig Castilla, PA   100 mg at 05/14/19 0849 ? QUEtiapine (SEROquel) Immediate Release tablet 200 mg  200 mg Oral BID Mina Marble, MD   200 mg at 05/15/19 3295 ? QUEtiapine (SEROquel) Immediate Release tablet 50 mg  50 mg Oral Q4H PRN Greig Castilla, PA   50 mg at 05/13/19 1541 ? senna-docusate (SENNA-PLUS) 8.6-50 mg per tablet 1 tablet  1 tablet Oral DAILY PRN Greig Castilla, PA     ? traZODone (DESYREL) tablet 50 mg  50 mg Oral Nightly PRN Greig Castilla, PA     ? zolpidem (AMBIEN) tablet 5 mg  5 mg Oral Nightly Mina Marble, MD   5 mg at 05/14/19 2026 PRN Medications:acetaminophen,  acetaminophen, acetaminophen, benztropine, diphenhydrAMINE, hydrOXYzine, OLANZapine (ZyPREXA) IM injection, ondansetron, polyethylene glycol, QUEtiapine, QUEtiapine, senna-docusate, traZODoneObjective: Vitals:Most recent: Patient Vitals for the past 24 hrs: BP Temp Pulse Resp SpO2 Height 05/15/19 0721 103/77 98.5 ?F (36.9 ?C) 78 18 97 % -- 05/14/19 2001 111/71 99.1 ?F (37.3 ?C) 81 18 97 % -- 05/14/19 1641 -- -- -- -- -- 5' 4 (1.626 m) Labs:Last 24 hours: No results found for this or any previous visit (from the past 24 hour(s)).Review of Systems Gastrointestinal: Negative for nausea. Psychiatric/Behavioral: Positive for agitation, behavioral problems, decreased concentration, dysphoric mood and suicidal ideas. Negative for hallucinations and sleep disturbance. The patient is nervous/anxious.  All other systems reviewed and are negative.Mental Status Exam: General AppearanceHabitus:  ThinGrooming:  FairPsychiatricAttitude: Cooperative Airline pilot) HostilePsychomotor Behavior: No psychomotor activation or retardationSpeech: loud   Patient reported mood:  angryAffect: anxiousAffect: irritable.Thought Process: Concrete.Thought Content: no auditory hallucinations, no visual hallucinationsThought Content: no delusional statements, no internal preoccupation.Suicidal Ideation: current suicidal ideation, no current suicidal intent, no planHomicidal Ideation: Statements threatening for violence in milieu.Judgment: limited.Insight: limited.Cognitive EvaluationOrientation: Oriented to person, oriented to place and oriented to date/time Attention and Concentration:  Normal attention and concentrationMemory: Recent and remote memory intactLanguage:  Language intactAssessment: Hospital Problem List:Active Hospital Problems  Diagnosis ? Principal Problem/Diagnosis:  Other bipolar disorder (HC Code) [F31.89] ? 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 Homelessness [Z59.0] ? MDD (major depressive disorder), recurrent severe, without psychosis (HC Code) [F33.2] Assessment:Zoe Scott is a 24 y.o. female with history of MDD and prior OD and self cutting behavior self presents to ED with suicidal ideations and anxiety. Today, patient presents irritable and hyper focused on her quarantine and discharge. Because pt so acutely agitated unable to make insightful remarks about her medications or hospital course or to discuss change in symptoms. She makes suicidal remarks in context of her immediate situation of having to isolate. She made threats to harm staff and peers on milieu in context of drawing attention to her situation, which she believes to be unfair. Considering axis 2 component as a primary etiology of her behavioral response and recent exacerbation of symptoms, specifically suicidiality and aggression towards others. At this time, the patient continues to warrant inpatient psychiatric hospitalization for safety, stabilization, close monitoring, diagnostic clarity and aftercare planning.Condition:UnchangedPlan: Level of Observation: There are no questions and answers to display. ?	Monitor for safety at current level of observationPsychotherapeutic/Milieu Interventions: ?	Progress hospital mobility status as tolerated, with movement toward discharge?	Participation in groups and individual psychotherapy, milieu therapy Psychopharmacologic Interventions: ?	Continue Seroquel for mood stabilization, Continue lithium for mood stabilization. o	Recently increased?	Monitor symptoms and side effects of medicationsDisposition/Family: ?	Will work with SW to obtain collateral from family and outpatient providersConsulting Services Appreciations:XRAY HAND - Negative StudyTotal time spent face-to-face with patient, reviewing record, and coordinating care:  25-30 minutes spent face to face and coordinating care - Moderate Complexity Medical Decision Making [id number].Voicemail Left for Grandmother [phone] Abel Presto Signed:Luisangel Wainright Jessee Avers, PA3/6/202110:39 AMElectronically Signed by Wilnette Kales, PA, May 15, 2019

## 2019-05-15 NOTE — Progress Notes
[facility]	Inpatient Progress NoteAttending Provider: Mina Marble, MD - Length of Stay (Days): 3Legal Admission Status: Voluntary Psychiatric Subjective: Interim History: Patient was seen and examined. Case reviewed with staff and chart reviewed. CC: I'm here for treatment but I'm isolated! No one comes to see me!!!Pt interviewed in her room, she has been on isolation precaution after being exposed to another patient in the ER who tested COVID positive; reason for isolation quarantine exposed COVID which was explained in detail to the patient yesterday ncluding the protocol and restrictions that will need to be put in place which she verbalized her understanding.Today, she's very angry, yelling, and constantly cursing while making threatening statements, I will sue all of you!! You are keeping me here for money!!! Pt states that she does not feel good at all for having to stay in her room and claims that no one comes to check on her. Repeatedly saying in a loud tone no one is taking care of me!! She has so many complaints about staff, accusing her nurse of not doing her job as she never came to give me water! and patient then threw her water pitcher. She was verbally aggressive and difficult to interrupt and redirect; she was screaming and cursing pointing at my face, threatening me that she will sue the hospital if I don't discharge her and then threw the papers in her room. I tried to remind her again about the conversation that I had with her yesterday about medications and what the isolation entails but she refuses to listen stating that I did not talk to her about anything and that I promised to give her her cell phone which I did not.She did eventually sign the 3-day paper as she was very relentless about leaving the hospital.Patient has never verbalized being suicidal during the interview and she was not psychotic. Throughout my interaction with her and with the patient's nurse in the room with me, the patient displayed extreme reactions to situational changes - she was extremely hostile, her anger was intense, inappropriate and difficult to control and she yelled, cursed, and threatened me and her nurse constantly and I had to end the interview. Review of Allergies/Meds/Hx: I have reviewed the patient's current medications, allergies and past medical history.Current Medications:Scheduled Medications:Current Facility-Administered Medications Medication Dose Route Frequency Provider Last Rate Last Admin ? acetaminophen (TYLENOL) tablet 325 mg  325 mg Oral Q6H PRN Mina Marble, MD     ? acetaminophen (TYLENOL) tablet 650 mg  650 mg Oral Q6H PRN Mina Marble, MD     ? acetaminophen (TYLENOL) tablet 975 mg  975 mg Oral Q6H PRN Mina Marble, MD     ? benztropine (COGENTIN) tablet 1 mg  1 mg Oral Q4H PRN Greig Castilla, PA     ? diphenhydrAMINE (BENADRYL) injection 50 mg  50 mg Intramuscular Q4H PRN Greig Castilla, PA     ? hydrOXYzine (ATARAX) tablet 25 mg  25 mg Oral Q4H PRN Greig Castilla, PA   25 mg at 05/13/19 1315 ? lithium (ESKALITH) extended release tablet 450 mg  450 mg Oral BID WC Mina Marble, MD   450 mg at 05/15/19 4782 ? OLANZapine (ZyPREXA) 10 mg in water for injection, sterile (5 mg/mL) IM vial  10 mg Intramuscular TID PRN Mina Marble, MD   10 mg at 05/15/19 9562 ? ondansetron (ZOFRAN-ODT) disintegrating tablet 4 mg  4 mg Translingual Q8H PRN Vozzella, Dominic, PA   4 mg at 05/12/19 1709 ? polyethylene glycol (MIRALAX)  packet 17 g  17 g Oral Nightly PRN Greig Castilla, PA     ? QUEtiapine (SEROquel) Immediate Release tablet 100 mg  100 mg Oral Q4H PRN Greig Castilla, PA   100 mg at 05/14/19 0849 ? QUEtiapine (SEROquel) Immediate Release tablet 200 mg  200 mg Oral BID Mina Marble, MD   200 mg at 05/15/19 1610 ? QUEtiapine (SEROquel) Immediate Release tablet 50 mg  50 mg Oral Q4H PRN Greig Castilla, PA   50 mg at 05/13/19 1541 ? senna-docusate (SENNA-PLUS) 8.6-50 mg per tablet 1 tablet  1 tablet Oral DAILY PRN Greig Castilla, PA     ? traZODone (DESYREL) tablet 50 mg  50 mg Oral Nightly PRN Greig Castilla, PA     ? zolpidem (AMBIEN) tablet 5 mg  5 mg Oral Nightly Mina Marble, MD   5 mg at 05/14/19 2026 PRN Medications:acetaminophen, acetaminophen, acetaminophen, benztropine, diphenhydrAMINE, hydrOXYzine, OLANZapine (ZyPREXA) IM injection, ondansetron, polyethylene glycol, QUEtiapine, QUEtiapine, senna-docusate, traZODoneObjective: Vitals:Most recent: Patient Vitals for the past 24 hrs: BP Temp Temp src Pulse Resp SpO2 05/14/19 0837 124/84 98.1 ?F (36.7 ?C) -- 74 18 99 % 05/13/19 2129 106/69 97.8 ?F (36.6 ?C) Oral 61 18 99 % Labs:Last 24 hours: Recent Results (from the past 24 hour(s)) SARS CoV-2 (COVID-19) RNA - [facility])  Collection Time: 05/13/19  4:46 PM  Specimen: Nasopharynx; Viral Result Value Ref Range  SARS-CoV-2 RNA (COVID-19) Negative Negative Review of Systems Constitutional: Negative.  HENT: Negative.  Eyes: Negative.  Respiratory: Negative.  Cardiovascular: Negative.  Gastrointestinal: Negative.  Genitourinary: Negative.  Musculoskeletal: Negative.  Neurological: Negative.  Psychiatric/Behavioral: Positive for agitation. Negative for confusion, hallucinations, self-injury and suicidal ideas. Mental Status Exam:General AppearanceHabitus:  ThinGrooming:  FairMusculoskeletalStrength and Tone: Strength normalGait and Station: Stable gait and stable posturePsychiatricAttitude: UncooperativePsychomotor Behavior: agitated.Speech: loud   Patient reported mood:  depressed because I'm in this room!!Affect: angry.Thought Process: Coherent, logical, goal directedAssociations: NormalThought Content: patient does not report having delusions or hallucinations; she does not appear to be responding to internal stimuli.Suicidal Ideation: no current suicidal ideation, no current suicidal intent, no planHomicidal Ideation: No homicidal ideation, no planJudgment: limited.Insight: limited.Cognitive EvaluationOrientation: Oriented to date/time, oriented to place and oriented to person Memory: Recent and remote memory intactFund of Knowledge:  LimitedAbstract Reasoning: Decreased capacity for abstract reasoningAssessment: Hospital Problem List:Active Hospital Problems  Diagnosis ? Principal Problem/Diagnosis:  Other bipolar disorder (HC Code) [F31.89] ? 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 Homelessness [Z59.0] ? MDD (major depressive disorder), recurrent severe, without psychosis (HC Code) [F33.2] Assessment:Analina Ishibashi is a 24 y.o. female with history of MDD and prior OD and self-cutting behavior who presented with suicidal ideations and anxiety. Today, patient is very angry about the restrictions/precautions ordered and she is demanding to be discharged. At this time, the patient continues to warrant inpatient psychiatric hospitalization for safety, stabilization, close monitoring, diagnostic clarity and aftercare planning.Condition:UnchangedPlan: Level of Observation: ?	Monitor for safety at current level of observation?	Cont q15 minute checks for safety Psychotherapeutic/Milieu Interventions: ?	Progress hospital mobility status as tolerated, with movement toward discharge?	Participation in groups and individual psychotherapy, milieu therapy Psychopharmacologic Interventions: ?	Increase Seroquel to 200mg  BID to target anxiety and psychosis. ?	Continue Lithium 450 mg BID for unstable mood and as adjunctive treatment for major depressive disorder?	Monitor symptoms and side effects of medicationsObtain serum lithium level on 3/9Disposition/Family: ?	Will work with SW to obtain collateral from family and outpatient providersTotal time spent face-to-face with patient, reviewing record, and coordinating care:  greater than 45 minutes spent face to face with patient and coordinating care - High Complexity Medical Decision Making [phone].Signed:Janine Reller [place], MD3/5/20211:05 PM

## 2019-05-15 NOTE — Plan of Care
Withdrawn to bedroom sleeping for the early part of the shift. Awake after dinner. Pressing the call bell, reporting that she was feeling suicidal. Tearful while speaking with this Clinical research associate. Cites isolation precautions as a trigger. Receptive to empathetic support. Patient opened up about psychosocial stressors at home, including her mother trying to get custody of her 24 year old son. Reports having no real support network. Patient is currently taking classes for nursing school. Reported feeling better and presented with brightened affect after speaking. Accepted scheduled medications. Patient agreed to notify staff if intending to act on suicidal ideation. Denies auditory and visual hallucinations. Denies homicidal ideation. Remains on isolation precautions. Continue to monitor Q15. Problem: Adult Inpatient Plan of CareGoal: Plan of Care ReviewOutcome: Interventions implemented as appropriateGoal: Patient-Specific Goal (Individualized)Outcome: Interventions implemented as appropriateGoal: Absence of Hospital-Acquired Illness or InjuryOutcome: Interventions implemented as appropriateGoal: Optimal Comfort and WellbeingOutcome: Interventions implemented as appropriateGoal: Readiness for Transition of CareOutcome: Interventions implemented as appropriate Problem: Adult Behavioral Health Plan of CareGoal: Plan of Care ReviewOutcome: Interventions implemented as appropriateGoal: Patient-Specific Goal (Individualization)Outcome: Interventions implemented as appropriateGoal: Adheres to Safety Considerations for Self and OthersOutcome: Interventions implemented as appropriateGoal: Absence of New-Onset Illness or InjuryOutcome: Interventions implemented as appropriateGoal: Optimized Coping Skills in Response to Life StressorsOutcome: Interventions implemented as appropriateGoal: Develops/Participates in Biomedical scientist to Support Successful TransitionOutcome: Interventions implemented as appropriate Problem: Psychiatry Goal & Intervention PlanGoal: AggressionDescription: Signs and symptoms will be absent or manageable by discharge/transition of careOutcome: Interventions implemented as appropriateNote: Interventions from the Interdisciplinary Treatment Plan (SPOC)1.  No value filed. No value filed.2.  No value filed. No value filed.3.  No value filed. No value filed.4.  No value filed. No value filed.5.  No value filed. No value filed.Goal: Mood AlterationDescription: Signs and symptoms will be absent or manageable by discharge/transition of careOutcome: Interventions implemented as appropriateNote: Interventions from the Interdisciplinary Treatment Plan (SPOC)1.  Facilitate goal setting Throughout hospitalization2.  Provide counseling and emotional support As needed3.  Encourage healthy eating and sleeping patterns and self-care activities Throughout hospitalization4.  No value filed. No value filed.5.  No value filed. No value filed.Goal: Suicide RiskDescription: Signs and symptoms will be absent or manageable by discharge/transition of careOutcome: Interventions implemented as appropriateNote: Interventions from the Interdisciplinary Treatment Plan (SPOC)1.  Promote Identification of triggers Prior to discharge2.  Provide safe environment Throughout hospitalization3.  Encourage identification/practice/mastery of adaptive coping skills Q shift4.  No value filed. No value filed.5.  No value filed. No value filed. Problem: InfectionGoal: Infection Symptom ResolutionOutcome: Interventions implemented as appropriate Plan of Care Overview/ Patient Status

## 2019-05-15 NOTE — Plan of Care
Plan of Care Overview/ Patient Status    I'm getting worse because I am stuck in here with nothing to do. I want to be moved to a medical floor. States her anxiety and depression are worsening due to her quarantine. 'I flipped out this morning and punched the wall out of frustration. I'm going crazy in this room. I feel like a caged animal. Patient requested the number to patient relations. Writer provided patient with phone number. PA made aware. After speaking with provider patient was placed on a security 1:1 for aggression risk. I told him I would hurt myself or punch someone because I am frustrated that I have nothing to do here. Patient offered reading and coloring materials but declined stating, it's not my thing. X-ray to right hand completed. Patient denied pain, it's fine. Angry and labile throughout the shift but pleasant with Clinical research associate. Slight swelling and bruise noted to posterior right hand. Complaint with scheduled medications. Denied auditory/visual hallucinations and suicidal/homicidal ideations. No plan for self harm. Feels safe and is willing to contract for safety. Q15 minute safety checks continued.Later in the shift patient was moved to [facility] COVID psych unit. Patient agreeable. At least there I won't be bored. Report given to [facility], Charity fundraiser. Patient's belongings and chart transferred to room 9531 on NE9 at 1325. Security 1:1 discontinued.Problem: Adult Inpatient Plan of CareGoal: Plan of Care ReviewOutcome: Interventions implemented as appropriateGoal: Patient-Specific Goal (Individualized)Outcome: Interventions implemented as appropriateGoal: Absence of Hospital-Acquired Illness or InjuryOutcome: Interventions implemented as appropriateGoal: Optimal Comfort and WellbeingOutcome: Interventions implemented as appropriateGoal: Readiness for Transition of CareOutcome: Interventions implemented as appropriate Problem: Adult Behavioral Health Plan of CareGoal: Plan of Care ReviewOutcome: Interventions implemented as appropriateGoal: Patient-Specific Goal (Individualization)Outcome: Interventions implemented as appropriateGoal: Adheres to Safety Considerations for Self and OthersOutcome: Interventions implemented as appropriateGoal: Absence of New-Onset Illness or InjuryOutcome: Interventions implemented as appropriateGoal: Optimized Coping Skills in Response to Life StressorsOutcome: Interventions implemented as appropriateGoal: Develops/Participates in Biomedical scientist to Support Successful TransitionOutcome: Interventions implemented as appropriate Problem: Psychiatry Goal & Intervention PlanGoal: AggressionDescription: Signs and symptoms will be absent or manageable by discharge/transition of careOutcome: Interventions implemented as appropriateNote: Interventions from the Interdisciplinary Treatment Plan (SPOC)1.  No value filed. No value filed.2.  No value filed. No value filed.3.  No value filed. No value filed.4.  No value filed. No value filed.5.  No value filed. No value filed.Goal: Mood AlterationDescription: Signs and symptoms will be absent or manageable by discharge/transition of careOutcome: Interventions implemented as appropriateNote: Interventions from the Interdisciplinary Treatment Plan (SPOC)1.  Facilitate goal setting Throughout hospitalization2.  Provide counseling and emotional support As needed3.  Encourage healthy eating and sleeping patterns and self-care activities Throughout hospitalization4.  No value filed. No value filed.5.  No value filed. No value filed.Goal: Suicide RiskDescription: Signs and symptoms will be absent or manageable by discharge/transition of careOutcome: Interventions implemented as appropriateNote: Interventions from the Interdisciplinary Treatment Plan (SPOC)1.  Promote Identification of triggers Prior to discharge2.  Provide safe environment Throughout hospitalization3.  Encourage identification/practice/mastery of adaptive coping skills Q shift4.  No value filed. No value filed.5.  No value filed. No value filed. Problem: InfectionGoal: Infection Symptom ResolutionOutcome: Interventions implemented as appropriate

## 2019-05-15 NOTE — Transfer Summaries
Patient arrived from WT 9 via WC accompanied by RN, security sitter dc prior to arriving on unit, patient is a/o x4, she was oriented to room, she curently denies SI/HI, verbalized that she is willing to call RN for changes/needs, COVID isolation maintained, VM for Line of Sight monitoring, she denies pain, WCTM

## 2019-05-16 ENCOUNTER — Encounter: Admit: 2019-05-16 | Payer: PRIVATE HEALTH INSURANCE | Primary: Cardiovascular Disease

## 2019-05-16 LAB — UA REFLEX CULTURE

## 2019-05-16 MED ORDER — SODIUM CHLORIDE 0.9 % BOLUS (NEW BAG)
0.9 % | Freq: Once | INTRAVENOUS | Status: CP
Start: 2019-05-16 — End: ?
  Administered 2019-05-16: 12:00:00 0.9 mL/h via INTRAVENOUS

## 2019-05-16 MED ORDER — QUETIAPINE IMMEDIATE RELEASE 50 MG TABLET
50 mg | Freq: Two times a day (BID) | ORAL | Status: DC
Start: 2019-05-16 — End: 2019-05-18
  Administered 2019-05-17 (×2): 50 mg via ORAL

## 2019-05-16 NOTE — Plan of Care
Plan of Care Overview/ Patient Status    Assumed care between 1500-2300. VSS on RA. Pt's alert and oriented x4. Pt denies pain, cough or SOB. Calm and cooperative with care and meds. Denies visual/audio hallucination, SI/HI, no aggressive behaviors noted. Complains of urinary frequency and urgency. U/A and culture obtained. Pt had 75% dinner intake. OOB to bathroom independently with steady gait. Resting in her room. Call light within reach. Safety measures and precaution maintained. Continues video monitoring throughout shift.  2030-bed time snacks provided upon request. MD made aware UA result. Awaiting for culture and sensitivity result. Problem: Adult Inpatient Plan of CareGoal: Plan of Care ReviewOutcome: Interventions implemented as appropriateGoal: Patient-Specific Goal (Individualized)Outcome: Interventions implemented as appropriateGoal: Absence of Hospital-Acquired Illness or InjuryOutcome: Interventions implemented as appropriateGoal: Optimal Comfort and WellbeingOutcome: Interventions implemented as appropriateGoal: Readiness for Transition of CareOutcome: Interventions implemented as appropriate Problem: Adult Behavioral Health Plan of CareGoal: Plan of Care ReviewOutcome: Interventions implemented as appropriateGoal: Patient-Specific Goal (Individualization)Outcome: Interventions implemented as appropriateGoal: Adheres to Safety Considerations for Self and OthersOutcome: Interventions implemented as appropriateGoal: Absence of New-Onset Illness or InjuryOutcome: Interventions implemented as appropriateGoal: Optimized Coping Skills in Response to Life StressorsOutcome: Interventions implemented as appropriateGoal: Develops/Participates in Biomedical scientist to Support Successful TransitionOutcome: Interventions implemented as appropriate

## 2019-05-16 NOTE — Plan of Care
Problem: Adult Inpatient Plan of CareGoal: Plan of Care ReviewOutcome: Interventions implemented as appropriateGoal: Patient-Specific Goal (Individualized)Outcome: Interventions implemented as appropriateGoal: Absence of Hospital-Acquired Illness or InjuryOutcome: Interventions implemented as appropriateGoal: Optimal Comfort and WellbeingOutcome: Interventions implemented as appropriate Problem: Adult Behavioral Health Plan of CareGoal: Patient-Specific Goal (Individualization)Outcome: Interventions implemented as appropriateGoal: Adheres to Safety Considerations for Self and OthersOutcome: Interventions implemented as appropriateGoal: Absence of New-Onset Illness or InjuryOutcome: Interventions implemented as appropriate Problem: InfectionGoal: Infection Symptom ResolutionOutcome: Interventions implemented as appropriate Plan of Care Overview/ Patient Status    Sleeping well without complaints.  Covid  isolation in place. Viideo monitoring in use. [place], RN11:52 PMBp this am 86/56 manually. Patient asymptomatic.  Remainder of vitals stable. Soledad Gerlach made aware.  Iv initiated. 500 ml ns IV bolus infusing. Patient in good spirits.  Denies si.  Po fluid encouraged.  States is drinking well.  Monitoring.Olene Floss, RN6:55 AM

## 2019-05-16 NOTE — Plan of Care
Plan of Care Overview/ Patient Status    Assumed care of pt from 0700. Alert and oriented x 4.  Pt reported feeling anxious, PRN administered. Compliant with medication administration. Call bell within reach. Bed to the lowest position. See flowsheet for additional information.1055: Patient asking to leave as she reports mother is upset at me for being in the hospital and doesn't have a babysitter for tomorrow. She's trying to get custody of my son to get child support. It was explained to patient that in order to have a safe discharge plan she needs to meet with social worker, PA Roddie Mc was made aware of patients report. AM dose of seroquel administered as it was unheld by PA. Patient observed pacing in room, visibly anxious.1400: Patient is observed sleeping, resting comfortably in bed. She finished 75% lunch. Will assess orthostatic vitals when she awakes.1500: Patient awake, vitals are stable. She seems in a better mood, less anxious that this morning. On the phone with grandmother.Continue with plan of care. Will continue to monitor and assess.Lanice Shirts, MSN, RN

## 2019-05-17 DIAGNOSIS — Z9114 Patient's other noncompliance with medication regimen: Secondary | ICD-10-CM

## 2019-05-17 DIAGNOSIS — F419 Anxiety disorder, unspecified: Secondary | ICD-10-CM

## 2019-05-17 DIAGNOSIS — Z20822 Contact with and (suspected) exposure to covid-19: Secondary | ICD-10-CM

## 2019-05-17 DIAGNOSIS — Z915 Personal history of self-harm: Secondary | ICD-10-CM

## 2019-05-17 DIAGNOSIS — Z560 Unemployment, unspecified: Secondary | ICD-10-CM

## 2019-05-17 DIAGNOSIS — R45851 Suicidal ideations: Secondary | ICD-10-CM

## 2019-05-17 DIAGNOSIS — Z590 Homelessness: Secondary | ICD-10-CM

## 2019-05-17 DIAGNOSIS — F319 Bipolar disorder, unspecified: Secondary | ICD-10-CM

## 2019-05-17 DIAGNOSIS — F1290 Cannabis use, unspecified, uncomplicated: Secondary | ICD-10-CM

## 2019-05-17 DIAGNOSIS — Z7289 Other problems related to lifestyle: Secondary | ICD-10-CM

## 2019-05-17 LAB — COMPREHENSIVE METABOLIC PANEL
BKR A/G RATIO: 1.7 (ref 1.0–2.2)
BKR ALANINE AMINOTRANSFERASE (ALT): 15 U/L (ref 10–35)
BKR ALBUMIN: 4.8 g/dL (ref 3.6–4.9)
BKR ALKALINE PHOSPHATASE: 44 U/L (ref 9–122)
BKR ANION GAP: 10 (ref 7–17)
BKR ASPARTATE AMINOTRANSFERASE (AST): 32 U/L (ref 10–35)
BKR BILIRUBIN TOTAL: 0.2 mg/dL (ref <=1.2)
BKR BLOOD UREA NITROGEN: 14 mg/dL (ref 6–20)
BKR BUN / CREAT RATIO: 16.9 (ref 8.0–23.0)
BKR CALCIUM: 9.7 mg/dL (ref 8.8–10.2)
BKR CHLORIDE: 99 mmol/L (ref 98–107)
BKR CO2: 26 mmol/L (ref 20–30)
BKR CREATININE: 0.83 mg/dL (ref 0.40–1.30)
BKR EGFR (AFR AMER): >60 mL/min/{1.73_m2} (ref >60)
BKR EGFR (NON AFRICAN AMERICAN): >60 mL/min/{1.73_m2} (ref >60)
BKR GLOBULIN: 2.9 g/dL
BKR GLUCOSE: 49 mg/dL — CL (ref 70–100)
BKR POTASSIUM: 3.6 mmol/L (ref 3.3–5.1)
BKR PROTEIN TOTAL: 7.7 g/dL (ref 6.6–8.7)
BKR SODIUM: 135 mmol/L — ABNORMAL LOW (ref 136–144)

## 2019-05-17 LAB — URINE CULTURE: BKR URINE CULTURE, ROUTINE: 1000 — AB

## 2019-05-17 LAB — LITHIUM LEVEL: BKR LITHIUM LEVEL: 0.92 mmol/L (ref 0.60–1.20)

## 2019-05-17 MED ORDER — LITHIUM CARBONATE ER 450 MG TABLET,EXTENDED RELEASE
450 mg | ORAL_TABLET | Freq: Two times a day (BID) | ORAL | 1 refills | Status: AC
Start: 2019-05-17 — End: 2019-06-17

## 2019-05-17 MED ORDER — HYDROXYZINE HCL 25 MG TABLET
25 mg | ORAL_TABLET | ORAL | 1 refills | Status: AC | PRN
Start: 2019-05-17 — End: 2019-06-17

## 2019-05-17 MED ORDER — QUETIAPINE IMMEDIATE RELEASE 50 MG TABLET
50 mg | ORAL_TABLET | Freq: Two times a day (BID) | ORAL | 1 refills | Status: AC
Start: 2019-05-17 — End: 2019-06-17

## 2019-05-17 MED ORDER — ZOLPIDEM 5 MG TABLET
5 mg | ORAL_TABLET | Freq: Every evening | ORAL | 1 refills | Status: AC
Start: 2019-05-17 — End: 2019-06-17

## 2019-05-17 NOTE — Other
Zoe Scott was discharged via Ambulatory accompanied by Alone.  Verbalized understanding of discharge instructionsand recommended follow up care as per the after visit summary.  Written discharge instructions provided. Denies any further questions. Vital signs    Vitals:  05/16/19 1523 05/16/19 2013 05/17/19 0656 05/17/19 0856 BP: 97/61 107/63 102/68 112/75 Pulse: 65 70 81 71 Resp:  18  18 Temp:  97.8 ?F (36.6 ?C)  97.5 ?F (36.4 ?C) TempSrc:  Oral  Oral SpO2:  100% 100% 100% Weight:     Height:     IV access removed.No tele present.[facility] delivered meds.  Meds have been given to pt.Pt's belongings have been returned from security and also given to pt.

## 2019-05-17 NOTE — Progress Notes
[facility]	Inpatient Progress NoteAttending Provider: Mina Marble, MD - Length of Stay (Days): 5Legal Admission Status: Voluntary Psychiatric Subjective: Interim History: Patient was seen and examined. Case reviewed with staff and chart reviewed. I am just waiting to be discharged, can I go?Patient makes above statement when writer assessed for pt's change in mood in interim. Discussed with patient that a safe discharge plan is necessary which includes evaluation by social work. Can she get me housing? Informed pt that 211 may be an option although LCSW would have better understanding of this subject as she also cares for a 70 year old child. Complaining of dizziness this morning and urinary frequency. UA ordered last night which was underwhelming. Encouraged pt to have oral fluids. Tolerated gentle hydration from IV fluids this morning well. Held morning seroquel as a cautionary measure. Will schedule orthostatic vitals this afternoon and see how patient tolerates daily Seroquel given at 1057. 1:1 DC'd yesterday after change to NE 9 in which patient has no episodes of behavioral agitation. Later informed by RN that patient wants to go today. Informed RN that a discussion was already had with patient about discharge planning and to reiterate recommendations and provide support.Patient no longer endorsing SI. Denies auditory and visual hallucination. Medication Adherent: YesPsychiatric PRNs Administered: None Review of Allergies/Meds/Hx: I have reviewed the patient's current medications, allergies and past medical history.Current Medications:Scheduled Medications:Current Facility-Administered Medications Medication Dose Route Frequency Provider Last Rate Last Admin ? acetaminophen (TYLENOL) tablet 325 mg  325 mg Oral Q6H PRN Mina Marble, MD     ? acetaminophen (TYLENOL) tablet 650 mg  650 mg Oral Q6H PRN Mina Marble, MD ? acetaminophen (TYLENOL) tablet 975 mg  975 mg Oral Q6H PRN Mina Marble, MD     ? benztropine (COGENTIN) tablet 1 mg  1 mg Oral Q4H PRN Greig Castilla, PA     ? diphenhydrAMINE (BENADRYL) injection 50 mg  50 mg Intramuscular Q4H PRN Greig Castilla, PA     ? hydrOXYzine (ATARAX) tablet 25 mg  25 mg Oral Q4H PRN Greig Castilla, PA   25 mg at 05/16/19 0825 ? lithium (ESKALITH) extended release tablet 450 mg  450 mg Oral BID WC Mina Marble, MD   450 mg at 05/16/19 9147 ? OLANZapine (ZyPREXA) 10 mg in water for injection, sterile (5 mg/mL) IM vial  10 mg Intramuscular TID PRN Mina Marble, MD   10 mg at 05/15/19 8295 ? ondansetron (ZOFRAN-ODT) disintegrating tablet 4 mg  4 mg Translingual Q8H PRN Emmanuel Gruenhagen, PA   4 mg at 05/12/19 1709 ? polyethylene glycol (MIRALAX) packet 17 g  17 g Oral Nightly PRN Greig Castilla, PA     ? QUEtiapine (SEROquel) Immediate Release tablet 100 mg  100 mg Oral Q4H PRN Greig Castilla, PA   100 mg at 05/14/19 0849 ? QUEtiapine (SEROquel) Immediate Release tablet 200 mg  200 mg Oral BID Mina Marble, MD   200 mg at 05/16/19 1057 ? QUEtiapine (SEROquel) Immediate Release tablet 50 mg  50 mg Oral Q4H PRN Greig Castilla, PA   50 mg at 05/13/19 1541 ? senna-docusate (SENNA-PLUS) 8.6-50 mg per tablet 1 tablet  1 tablet Oral DAILY PRN Greig Castilla, PA     ? traZODone (DESYREL) tablet 50 mg  50 mg Oral Nightly PRN Greig Castilla, PA     ? zolpidem (AMBIEN) tablet 5 mg  5 mg Oral Nightly Mina Marble, MD   5 mg at 05/15/19 2036 PRN Medications:acetaminophen, acetaminophen, acetaminophen, benztropine, diphenhydrAMINE, hydrOXYzine,  OLANZapine (ZyPREXA) IM injection, ondansetron, polyethylene glycol, QUEtiapine, QUEtiapine, senna-docusate, traZODoneObjective: Vitals:Most recent: Patient Vitals for the past 24 hrs: BP Temp Temp src Pulse Resp SpO2 05/16/19 0728 101/67 -- -- 71 -- -- 05/16/19 0727 104/73 -- -- 65 -- -- 05/16/19 0726 96/61 98.3 ?F (36.8 ?C) Oral 66 16 100 % 05/16/19 0539 (!) 86/54 -- -- -- -- -- 05/16/19 0536 (!) 87/58 97.9 ?F (36.6 ?C) -- (!) 58 18 99 % 05/15/19 1810 106/69 97.7 ?F (36.5 ?C) Oral 63 18 100 % Labs:Last 24 hours: Recent Results (from the past 24 hour(s)) UA reflex to culture  Collection Time: 05/15/19  6:39 PM  Specimen: Urine Result Value Ref Range  Reflex Urine Culture See Comment  Urinalysis with culture reflex     ([facility])  Collection Time: 05/15/19  6:39 PM  Specimen: Urine Result Value Ref Range  Clarity, UA Clear Clear  Color, UA Yellow Yellow  Specific Gravity, UA 1.016 1.005 - 1.030  pH, UA 7.0 5.5 - 7.5  Protein, UA Negative Negative-Trace  Glucose, UA Negative Negative  Ketones, UA Negative Negative  Blood, UA Negative Negative  Bilirubin, UA Negative Negative  Leukocytes, UA Positive (A) Negative  Nitrite, UA Negative Negative  Urobilinogen, UA <2.0 <=2.0 EU/dL Urine microscopic     ([facility])  Collection Time: 05/15/19  6:39 PM Result Value Ref Range  Epithelial Cells Moderate (A) None-Few /LPF  Hyaline Casts, UA 1 0 - 3 /LPF  Bacteria, UA None None-Few /HPF  WBC/HPF, UA 5 0 - 5 /HPF  RBC/HPF, UA 1 0 - 2 /HPF Urine culture  Collection Time: 05/15/19  6:39 PM  Specimen: Urine Result Value Ref Range  Urine Culture, Routine No Growth to Date  Review of Systems Gastrointestinal: Negative for nausea. Psychiatric/Behavioral: Positive for agitation, behavioral problems, decreased concentration, dysphoric mood and suicidal ideas. Negative for hallucinations and sleep disturbance. The patient is nervous/anxious.  All other systems reviewed and are negative.Mental Status Exam: General AppearanceHabitus:  ThinGrooming:  FairPsychiatricAttitude: Cooperative (Cursory cooperation)Psychomotor Behavior: No psychomotor activation or retardationSpeech: soft   Patient reported mood: waitingAffect: anxious, dysphoricThought Process: Concrete.Thought Content: no auditory hallucinations, no visual hallucinationsThought Content: no delusional statements, no internal preoccupation.Suicidal Ideation: No current suicidal plan, ideation or intent no current suicidal ideation, no current suicidal intent, no planHomicidal Ideation: No current homicidal ideation, plan or intentJudgment: limited.Insight: limited.Cognitive EvaluationOrientation: Oriented to person, oriented to place and oriented to date/time Attention and Concentration:  Normal attention and concentrationMemory: Recent and remote memory intactLanguage:  Language intactAssessment: Scott Problem List:Active Scott Problems  Diagnosis ? Principal Problem/Diagnosis:  Other bipolar disorder (HC Code) [F31.89] ? 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 Homelessness [Z59.0] ? MDD (major depressive disorder), recurrent severe, without psychosis (HC Code) [F33.2] Assessment:Zoe Scott is a 24 y.o. female with history of MDD and prior OD and self cutting behavior self presents to ED with suicidal ideations and anxiety. Today, patient presents as calm and anxious to writer regarding her life stressors. Unclear if seroquel vs dehydration contribution to low blood pressure this morning. Orthostatics for this afternoon. Likely that patient will need assistance with a safe discharge plan. Unclear if titration of medications will result in any more benefit as life stressors and behavioral components contribute to patient's presentation. At this time, the patient continues to warrant inpatient psychiatric hospitalization for safety, stabilization, close monitoring, diagnostic clarity and aftercare planning.Condition:UnchangedPlan: Level of Observation: There are no questions and answers to  display. ?	Monitor for safety at current level of observationPsychotherapeutic/Milieu Interventions: ?	Progress Scott mobility status as tolerated, with movement toward discharge?	Participation in groups and individual psychotherapy, milieu therapy Psychopharmacologic Interventions: ?	Continue Seroquel for mood stabilization, Continue lithium for mood stabilization. o	Consider decreasing Seroquel if continued intermittent hypotensiono	Orthostatic vitals for this afternoono	Labs and level for tomorrow 3/8?	Monitor symptoms and side effects of medicationsDisposition/Family: ?	Will work with SW to obtain collateral from family and outpatient providersConsulting Services Appreciations:Total time spent face-to-face with patient, reviewing record, and coordinating care:  25-30 minutes spent face to face and coordinating care - Moderate Complexity Medical Decision Making 16109.Addendum update documentation to reflect a decrease in Seroquel dosing secondary to pt's recent low, low/normal blood pressure readings. Signed:Harkirat Orozco Jessee Avers, PA3/7/202110:06 AMElectronically Signed by Wilnette Kales, PA, May 16, 2019

## 2019-05-17 NOTE — Plan of Care
Plan of Care Overview/ Patient Status    Assumed pt care at 0700. A+O x4. Pt stated I need to go home today, there is no one to watch my son. I'm also missing school work that I can't miss for my A&P class. OOB independently. VSS on RA, no tele. No complaints of pain, n/v or sob. Complaints of anxiety, PRN atarax administered w/ good effect. NAD noted. Covid precautions maintained. Pt denies SI/HI. Pt ate about 50% of her breakfast. Meds administered per MAR. Line of sight video monitoring. Q15 mins checks maintained for pt safety. 1000 Critical lab result, pt's blood Glucose level 49, pt asymptomatic, notified PA, provided pt with juices and snacks.1230 Pt's grandmother Helmut Muster just called and verified that pt is staying with her currently. Notified Child psychotherapist and Lawyer.Problem: Adult Inpatient Plan of CareGoal: Plan of Care ReviewOutcome: Interventions implemented as appropriateGoal: Patient-Specific Goal (Individualized)Outcome: Interventions implemented as appropriateGoal: Absence of Hospital-Acquired Illness or InjuryOutcome: Interventions implemented as appropriateGoal: Optimal Comfort and WellbeingOutcome: Interventions implemented as appropriateGoal: Readiness for Transition of CareOutcome: Interventions implemented as appropriate Problem: Adult Behavioral Health Plan of CareGoal: Plan of Care ReviewOutcome: Interventions implemented as appropriateGoal: Patient-Specific Goal (Individualization)Outcome: Interventions implemented as appropriateGoal: Adheres to Safety Considerations for Self and OthersOutcome: Interventions implemented as appropriateGoal: Absence of New-Onset Illness or InjuryOutcome: Interventions implemented as appropriateGoal: Optimized Coping Skills in Response to Life StressorsOutcome: Interventions implemented as appropriateGoal: Develops/Participates in Biomedical scientist to Support Successful TransitionOutcome: Interventions implemented as appropriate Problem: Psychiatry Goal & Intervention PlanGoal: AggressionDescription: Signs and symptoms will be absent or manageable by discharge/transition of careOutcome: Interventions implemented as appropriateNote: Interventions from the Interdisciplinary Treatment Plan (SPOC)1.  No value filed. No value filed.2.  No value filed. No value filed.3.  No value filed. No value filed.4.  No value filed. No value filed.5.  No value filed. No value filed.Goal: Mood AlterationDescription: Signs and symptoms will be absent or manageable by discharge/transition of careOutcome: Interventions implemented as appropriateNote: Interventions from the Interdisciplinary Treatment Plan (SPOC)1.  Facilitate goal setting Throughout hospitalization2.  Provide counseling and emotional support As needed3.  Encourage healthy eating and sleeping patterns and self-care activities Throughout hospitalization4.  No value filed. No value filed.5.  No value filed. No value filed.Goal: Suicide RiskDescription: Signs and symptoms will be absent or manageable by discharge/transition of careOutcome: Interventions implemented as appropriateNote: Interventions from the Interdisciplinary Treatment Plan (SPOC)1.  Promote Identification of triggers Prior to discharge2.  Provide safe environment Throughout hospitalization3.  Encourage identification/practice/mastery of adaptive coping skills Q shift4.  No value filed. No value filed.5.  No value filed. No value filed. Problem: InfectionGoal: Infection Symptom ResolutionOutcome: Interventions implemented as appropriate Problem: AnxietyGoal: Anxiety Reduction or ResolutionOutcome: Interventions implemented as appropriate Problem: DepressionGoal: Improved MoodOutcome: Interventions implemented as appropriate Problem: Oral Intake InadequateGoal: Improved Oral IntakeOutcome: Interventions implemented as appropriate

## 2019-05-17 NOTE — Discharge Instructions
Bipolar 1 DisorderBipolar 1 disorder is a mental health disorder in which a person has episodes of emotional highs (mania), and may also have episodes of emotional lows (depression) in addition to highs. Bipolar 1 disorder is different from other bipolar disorders because it involves extreme manic episodes. These episodes last at least one week or involve symptoms that are so severe that hospitalization is needed to keep the person safe.What increases the risk?The cause of this condition is not known. However, certain factors make you more likely to have bipolar disorder, such as:?	Having a family member with the disorder.?	An imbalance of certain chemicals in the brain (neurotransmitters).?	Stress, such as illness, financial problems, or a death.?	Certain conditions that affect the brain or spinal cord (neurologic conditions).?	Brain injury (trauma).?	Having another mental health disorder, such as:?	Obsessive compulsive disorder.?	Schizophrenia.What are the signs or symptoms?Symptoms of mania include:?	Very high self-esteem or self-confidence.?	Decreased need for sleep.?	Unusual talkativeness or feeling a need to keep talking. Speech may be very fast. It may seem like you cannot stop talking.?	Racing thoughts or constant talking, with quick shifts between topics that may or may not be related (flight of ideas).?	Decreased ability to focus or concentrate.?	Increased purposeful activity, such as work, studies, or social activity.?	Increased nonproductive activity. This could be pacing, squirming and fidgeting, or finger and toe tapping.?	Impulsive behavior and poor judgment. This may result in high-risk activities, such as having unprotected sex or spending a lot of money.Symptoms of depression include:?	Feeling sad, hopeless, or helpless.?	Frequent or uncontrollable crying.?	Lack of feeling or caring about anything.?	Sleeping too much.?	Moving more slowly than usual.?	Not being able to enjoy things you used to enjoy.?	Wanting to be alone all the time.?	Feeling guilty or worthless.?	Lack of energy or motivation.?	Trouble concentrating or remembering.?	Trouble making decisions.?	Increased appetite.?	Thoughts of death, or the desire to harm yourself.Sometimes, you may have a mixed mood. This means having symptoms of depression and mania. Stress can make symptoms worse.How is this diagnosed?To diagnose bipolar disorder, your health care provider may ask about your:?	Emotional episodes.?	Medical history.?	Alcohol and drug use. This includes prescription medicines. Certain medical conditions and substances can cause symptoms that seem like bipolar disorder (secondary bipolar disorder).How is this treated?Bipolar disorder is a long-term (chronic) illness. It is best controlled with ongoing (continuous) treatment rather than treatment only when symptoms occur. Treatment may include:?	Medicine. Medicine can be prescribed by a provider who specializes in treating mental disorders (psychiatrist).?	Medicines called mood stabilizers are usually prescribed.?	If symptoms occur even while taking a mood stabilizer, other medicines may be added.?	Psychotherapy. Some forms of talk therapy, such as cognitive-behavioral therapy (CBT), can provide support, education, and guidance.?	Coping methods, such as journaling or relaxation exercises. These may include:?	Yoga.?	Meditation.?	Deep breathing.?	Lifestyle changes, such as:?	Limiting alcohol and drug use.?	Exercising regularly.?	Getting plenty of sleep.?	Making healthy eating choices.A combination of medicine, talk therapy, and coping methods is best. A procedure in which electricity is applied to the brain through the scalp (electroconvulsive therapy) may be used in cases of severe mania when medicine and psychotherapy work too slowly or do not work.Follow these instructions at home:Activity?	Return to your normal activities as told by your health care provider.?	Find activities that you enjoy, and make time to do them.?	Exercise regularly as told by your health care provider.Lifestyle?	Limit alcohol intake to no more than 1 drink a day for nonpregnant women and 2 drinks a day for men. One drink equals 12 oz of beer, 5 oz of wine, or 1? oz of hard liquor.?	Follow a set schedule for eating and sleeping.?	Eat a balanced diet that includes fresh fruits and vegetables, whole grains, low-fat dairy, and lean meat.?	Get 7-8 hours of sleep each night.General  instructions?	Take over-the-counter and prescription medicines only as told by your health care provider.?	Think about joining a support group. Your health care provider may be able to recommend a support group.?	Talk with your family and loved ones about your treatment goals and how they can help.?	Keep all follow-up visits as told by your health care provider. This is important.Where to find more informationFor more information about bipolar disorder, visit the following websites:?	[employer] on Mental Illness: www.nami.org?	[employer] [employer]: LinkVoyage.dk a health care provider if:?	Your symptoms get worse.?	You have side effects from your medicine, and they get worse.?	You have trouble sleeping.?	You have trouble doing daily activities.?	You feel unsafe in your surroundings.?	You are dealing with substance abuse.Get help right away if:?	You have new symptoms.?	You have thoughts about harming yourself.?	You self-harm.This information is not intended to replace advice given to you by your health care provider. Make sure you discuss any questions you have with your health care provider.Document Released: 06/03/2000 Document Revised: 02/07/2017 Document Reviewed: 08/17/2017Elsevier Patient Education ? 2020 Elsevier Inc.Coping With Depression, TeenDepression is an experience of feeling down, blue, or sad. Depression can affect your thoughts and feelings, relationships, daily activities, and physical health. It is caused by changes in your brain that can be triggered by stress in your life or a serious loss.Everyone experiences occasional disappointment, sadness, and loss in their lives. When you are feeling down, blue, or sad for at least 2 weeks in a row, it may mean that you have depression. If you receive a diagnosis of depression, your health care provider will tell you which type of depression you have and the possible treatments to help.How can depression affect me?Being depressed can make daily activities more difficult. It can negatively affect your daily life, from school and sports performance to work and relationships. When you are depressed, you may:?	Want to be alone.?	Avoid interacting with others.?	Avoid doing the things you usually like to do.?	Notice changes in your sleep habits.?	Find it harder than usual to wake up and go to school or work.?	Feel angry at everyone.?	Feel like you do not have any patience.?	Have trouble concentrating.?	Feel tired all the time.?	Notice changes in your appetite.?	Lose or gain weight without trying.?	Have constant headaches or stomachaches.?	Think about death or attempting suicide often.What are things I can do to deal with depression?If you have had symptoms of depression for more than 2 weeks, talk with your parents or an adult you trust, such as a Veterinary surgeon at school or church or a Psychologist, occupational. You might be tempted to only tell friends, but you should tell an adult too. The hardest step in dealing with depression is admitting that you are feeling it to someone. The more people who know, the more likely you will be to get some help.Certain types of counseling can be very helpful in treating depression. A counseling professional can assess what treatments are going to be most helpful for you. These may include:?	Talk therapy.?	Medicines.?	Brain stimulation therapy.There are a number of other things you can do that can help you cope with depression on a daily basis, including:?	Spending time in nature.?	Spending time with trusted friends who help you feel better.?	Taking time to think about the positive things in your life and to feel grateful for them.?	Exercising, such as playing an active game with some friends or going for a run.?	Spending less time using electronics, especially at night before bed. The screens of TVs, computers, tablets, and phones make your brain think it is time to get up rather than go to bed.?	Avoiding spending too much time spacing out on TV or video games.  This might feel good for a while, but it ends up just being a way to avoid the feelings of depression.What should I do if my depression gets worse?If you are having trouble managing your depression or if your depression gets worse, talk to your health care provider about making adjustments to your treatment plan.You should get help immediately if:?	You feel suicidal and are making a plan to commit suicide.?	You are drinking or using drugs to stop the pain from your depression.?	You are cutting yourself or thinking about cutting yourself.?	You are thinking about hurting others and are making a plan to do so.?	You believe the world would be better off without you in it.?	You are isolating yourself completely and not talking with anyone.If you find yourself in any of these situations, you should do one of the following:?	Immediately tell your parents or best friend.?	Call and go see your health care provider or health professional.?	Call the suicide prevention hotline (828-404-9156 in the [place]?	Text the crisis line 314-022-5740 in the [place]Where can I get support?It is important to know that although depression is serious, you can find support from a variety of sources. Sources of help may include:?	Suicide prevention, crisis prevention, and depression hotlines.?	Engineer, maintenance, counselors, coaches, or clergy.?	Parents or other family members.?	Support groups.You can locate a counselor or support group in your area from one of the following sources:?	Mental Health America: www.mentalhealthamerica.net?	Anxiety and Depression Association of America (ADAA): ProgramCam.de?	National Alliance on Mental Illness (NAMI): www.nami.orgThis information is not intended to replace advice given to you by your health care provider. Make sure you discuss any questions you have with your health care provider.Document Released: 03/17/2015 Document Revised: 02/07/2017 Document Reviewed: 01/06/2017Elsevier Patient Education ? 2020 Elsevier Inc.Patient Education Transition of Care Information Specific to Psychiatric HospitalizationReason for Hospitalization/Treatment (Plain Language Statement): worsening depression and anxiety in the context of psychosocial stressors, cannabis abuse, and medication/treatment nonadherenceInformation Regarding Your Condition: You were hospitalized at [facility] for a mental health condition, as noted above in your Reason for Hospitalization/Treatment. At the time of discharge from the hospital, your psychiatric care team (including a physician, nurse, social worker, and interdisciplinary professionals) determined that your psychiatric symptoms have been acutely stabilized, and that you can continue with the next part of your mental health treatment and recovery outside of the stringent inpatient psychiatric hospital setting. However, mental health conditions are unpredictable, and symptoms may reappear after you leave the hospital. You can limit the recurrence of symptoms after hospitalization by following up with psychiatric care and by taking your medications as directed (see below for more details). If signs and symptoms of worsening psychiatric illness are detected early, close follow-up with a mental health professional may avoid the need for another inpatient psychiatric hospitalization.  Early warning signs for development of an episode of worsening mental illness may include: social withdrawal, problems thinking, drop in functioning, apathy, increased sensitivity, feeling disconnected, illogical thinking, severe nervousness, unusual behavior, changes in sleep or appetite, and/or mood changes. These symptoms, in conjunction with serious functional problems such as the inability to work or study, severe depression, mood swings, and difficulty relating to others, can be signs that a person should seek urgent evaluation by a mental health professional within the next week (24 hours up to 7 days). Mental health symptoms rarely occur out of the blue, and can often be noticed first by family, friends, or individuals. Concerning symptoms warranting emergent medical attention include (1) suicidal thoughts, intent and/or plans to hurt oneself, (2) homicidal thoughts, intent and/or plans to hurt others, and (3) grave disability,  as defined by difficulty taking care of self in activities of daily living (inability to provide oneself with basic personal needs for food, clothing, and/or shelter). If you (or a loved one) are experiencing these symptoms, please call 911 immediately to be brought to the nearest emergency room for a medical evaluation. Laboratory/Procedural Results Performed During Inpatient Hospitalization: Results for orders placed or performed during the hospital encounter of 05/11/19 Comprehensive metabolic panel (Collected: 05/17/2019  9:07 AM)  Collection Time: 05/17/19  9:07 AM Result Value Ref Range  Sodium 135 (L) 136 - 144 mmol/L  Potassium 3.6 3.3 - 5.1 mmol/L  Chloride 99 98 - 107 mmol/L  CO2 26 20 - 30 mmol/L  Anion Gap 10 7 - 17  Glucose 49 (LL) 70 - 100 mg/dL  BUN 14 6 - 20 mg/dL  Creatinine 1.61 0.96 - 1.30 mg/dL  Calcium 9.7 8.8 - 04.5 mg/dL  BUN/Creatinine Ratio 40.9 8.0 - 23.0  Total Protein 7.7 6.6 - 8.7 g/dL  Albumin 4.8 3.6 - 4.9 g/dL  Total Bilirubin 0.2 <=8.1 mg/dL  Alkaline Phosphatase 44 9 - 122 U/L  Alanine Aminotransferase (ALT) 15 10 - 35 U/L  Aspartate Aminotransferase (AST) 32 10 - 35 U/L  Globulin 2.9 g/dL  A/G Ratio 1.7 1.0 - 2.2  eGFR (Afr Amer) >60 >60 mL/min/1.4m2  eGFR (NON African-American) >60 >60 mL/min/1.42m2 Lithium level (Collected: 05/17/2019  7:58 AM)  Collection Time: 05/17/19  7:58 AM Result Value Ref Range  Lithium Lvl 0.92 0.60 - 1.20 mmol/L Urinalysis with culture reflex     ([facility]) (Collected: 05/15/2019  6:39 PM)  Collection Time: 05/15/19  6:39 PM  Specimen: Urine Result Value Ref Range  Clarity, UA Clear Clear  Color, UA Yellow Yellow  Specific Gravity, UA 1.016 1.005 - 1.030  pH, UA 7.0 5.5 - 7.5  Protein, UA Negative Negative-Trace  Glucose, UA Negative Negative  Ketones, UA Negative Negative  Blood, UA Negative Negative  Bilirubin, UA Negative Negative  Leukocytes, UA Positive (A) Negative  Nitrite, UA Negative Negative  Urobilinogen, UA <2.0 <=2.0 EU/dL UA reflex to culture (Collected: 05/15/2019  6:39 PM)  Collection Time: 05/15/19  6:39 PM  Specimen: Urine Result Value Ref Range  Reflex Urine Culture See Comment  Urine microscopic     ([facility]) (Collected: 05/15/2019  6:39 PM)  Collection Time: 05/15/19  6:39 PM Result Value Ref Range  Epithelial Cells Moderate (A) None-Few /LPF  Hyaline Casts, UA 1 0 - 3 /LPF  Bacteria, UA None None-Few /HPF  WBC/HPF, UA 5 0 - 5 /HPF  RBC/HPF, UA 1 0 - 2 /HPF Urine culture (Collected: 05/15/2019  6:39 PM)  Collection Time: 05/15/19  6:39 PM  Specimen: Urine Result Value Ref Range  Urine Culture, Routine 1,000 CFU/mL Beta-Hemolytic Streptococcus Group B (A)  SARS CoV-2 (COVID-19) RNA - [facility] ([facility]) (Collected: 05/13/2019  4:46 PM)  Collection Time: 05/13/19  4:46 PM  Specimen: Nasopharynx; Viral Result Value Ref Range  SARS-CoV-2 RNA (COVID-19) Negative Negative Toxicology screen, urine     (BH L) (Collected: 05/12/2019  5:12 PM)  Collection Time: 05/12/19  5:12 PM Result Value Ref Range  Alcohol Urine Negative Negative  Amphetamine Screen, Urine Negative Negative  Benzodiazepine Screen, Urine Negative Negative  Cannabinoid Screen, Urine Positive (A) Negative  Opiate Screen, Urine Negative Negative  6-Acetylmorphine Negative Negative  Oxycodone Screen, Urine Negative Negative  Phencyclidine Screen, Urine Negative Negative  Cocaine Metabolites, Ur Negative Negative  Methadone Metabolite  Screen, Urine, No Conf. Negative Negative  Barbiturate Screen, Urine Negative Negative  Propoxyphene Negative Negative  Drugs Of Abuse Note See Comment  Hemoglobin A1c (Collected: 05/12/2019  4:44 PM)  Collection Time: 05/12/19  4:44 PM Result Value Ref Range  Hemoglobin A1c 5.1 4.0 - 5.6 %  Estimated Average Glucose mg/dL 161 mg/dL Vitamin W96 (Collected: 05/12/2019  4:44 PM)  Collection Time: 05/12/19  4:44 PM Result Value Ref Range  Vitamin B12 659 232-1,245 pg/mL SARS CoV-2 (COVID-19) RNA-Rapid Admit Test (Collected: 05/11/2019  3:41 PM)  Collection Time: 05/11/19  3:41 PM  Specimen: Nasopharynx; Viral Result Value Ref Range  SARS-CoV-2 RNA (COVID-19) Not Detected Not Detected EKG (Collected: 05/11/2019  3:04 PM)  Collection Time: 05/11/19  3:04 PM Result Value Ref Range  Heart Rate 67 bpm  QRS Duration 90 ms  Q-T Interval 402 ms  QTC Calculation(Bezet) 424 ms  P Axis 11 deg  R Axis 38 deg  T Axis 29 deg  P-R Interval 140 msec  ECG - SEVERITY Borderline ECG severity Comprehensive metabolic panel (Collected: 05/11/2019  3:01 PM)  Collection Time: 05/11/19  3:01 PM Result Value Ref Range  Sodium 140 136 - 144 mmol/L  Potassium 4.0 3.3 - 5.1 mmol/L  Chloride 105 98 - 107 mmol/L  CO2 23 20 - 30 mmol/L  Anion Gap 12 7 - 17  Glucose 83 70 - 100 mg/dL  BUN 10 6 - 20 mg/dL  Creatinine 0.45 4.09 - 1.30 mg/dL  Calcium 9.2 8.8 - 81.1 mg/dL  BUN/Creatinine Ratio 91.4 8.0 - 23.0  Total Protein 7.1 6.6 - 8.7 g/dL  Albumin 4.5 3.6 - 4.9 g/dL  Total Bilirubin 0.5 <=7.8 mg/dL  Alkaline Phosphatase 36 9 - 122 U/L  Alanine Aminotransferase (ALT) 10 10 - 35 U/L  Aspartate Aminotransferase (AST) 21 10 - 35 U/L  Globulin 2.6 g/dL  A/G Ratio 1.7 1.0 - 2.2  eGFR (Afr Amer) >60 >60 mL/min/1.17m2  eGFR (NON African-American) >60 >60 mL/min/1.43m2 hCG, quantitative (Collected: 05/11/2019  3:01 PM)  Collection Time: 05/11/19  3:01 PM Result Value Ref Range  hCG, Quantitative <1 See Comment mIU/mL CBC auto differential (Collected: 05/11/2019  3:01 PM)  Collection Time: 05/11/19  3:01 PM Result Value Ref Range  WBC 8.3 4.8 - 10.8 x1000/?L  RBC 4.4 3.5 - 5.5 M/?L  Hemoglobin 12.9 12.0 - 15.0 g/dL  Hematocrit 29.5 62.1 - 48.0 %  MCV 86.8 81.0 - 99.0 fL  MCHC 33.7 33.0 - 37.0 g/dL  RDW-CV 30.8 65.7 - 84.6 %  Platelets 192 120 - 450 x1000/?L  MPV 10.9 8.0 - 12.0 fL  ANC (Abs Neutrophil Count) 5.3 2.2 - 7.2 x 1000/?L  Neutrophils 63.6 45.0 - 90.0 %  Lymphocytes 29.1 10.0 - 50.0 %  Absolute Lymphocyte Count 2.4 0.5 - 5.4 x 1000/?L  Monocytes 5.9 3.0 - 11.0 %  Monocyte Absolute Count 0.5 0.1 - 1.2 x 1000/?L  Eosinophils 0.5 0.0 - 4.0 %  Eosinophil Absolute Count 0.0 0.0 - 0.4 x 1000/?L  Basophil 0.5 0.0 - 2.0 %  Basophil Absolute Count 0.0 0.0 - 0.2 x 1000/?L  Immature Granulocytes 0.4 0.0 - 0.4 %  Absolute Immature Granulocyte Count 0.0 0.0 - 0.4 x 1000/?L  nRBC 0.0 0.0 - 0.0 %  Absolute nRBC 0.0 x 1000/?L  MCH 29.3 25.0 - 35.0 pg TSH (Collected: 05/11/2019  3:01 PM)  Collection Time: 05/11/19  3:01 PM Result Value Ref Range  Thyroid Stimulating Hormone 0.979 See Comment ?IU/mL Lipid panel (Collected:  05/11/2019  3:01 PM)  Collection Time: 05/11/19  3:01 PM Result Value Ref Range  Cholesterol 134 See Comment mg/dL  HDL 52 >=16 mg/dL  Triglycerides 37 See Comment mg/dL  Chol/HDL Ratio 2.6 0.0 - 5.0  LDL Calculated 75 See Comment mg/dL Obtaining Results of Laboratory or Procedural Studies Pending at Discharge: Unless otherwise noted elsewhere in the After Visit Summary, you do not have additional laboratory or procedural results pending. If you have any questions regarding the results of pending studies (if any) at discharge, please call the psychiatric unit where you were hospitalized, and ask to speak to a provider about the test or imaging results: [facility], inpatient psychiatry: [phone] psychiatry inpatient unit [place] 9: [phone] psychiatry inpatient unit [facility] 8: [phone] you would like a copy of your medical records, please call the [facility] Medical Records at [phone] to obtain further information. In addition, you may contact your primary provider to obtain laboratory or procedural results sent from the hospital. If you would like to have a virtual copy of your laboratory and procedural results, you are encouraged to sign up for a patient portal called MyChart to access your results digitally. There are instructions on how to access MyChart at the end of this After Visit Summary.Additional Information for Smoking Cessation, Alcohol Treatment, and/or Substance Abuse:During your inpatient hospitalization, you were asked about your preference for treatment after hospitalization for smoking cessation, alcohol treatment, and/or substance abuse treatment. If you were interested in follow-up care, you will find a follow-up appointment listed elsewhere in the After Visit Summary. General Information about Smoking Cessation:For smokers: If your doctor prescribed medicine to help you stop smoking, please continue to use it as directed.  Also, please tell your primary care doctor that you are taking medicine to stop smoking.  If you need more medicine; nicotine patches, lozenges and gum are available over the counter.  In [place], [place] [facility]) will cover nicotine replacement medicine if you have a prescription. The [place] Tobacco Quit Line provides convenient and free telephone-based services 24 hours per day, seven days per week to help you quit tobacco. The [employer] offers English, [place] and other Nutritional therapist, information, support, counseling, assistance creating a personalized Quit Plan, and referrals to cessation programs in your local community. You can always opt to self-refer to care using the [place] Tobacco Quit Line by contacting: 1-800-QUIT NOW 706-451-8839).  eStoreDirectory.at For Non-smokers:If you are not a smoker, national guidelines recommend continued abstinence from tobacco use, as smoking of any form is associated with lethal health consequences. Advanced Directives:Advance Directives are a written, signed statement that details the patient?s preferences for treatment should the patient be unable to make such decisions for him/herself, whether that incapacitation is due to psychiatric or non-psychiatric (medical) reasons. The statement informs others about what treatment the patient would or would not want to receive from psychiatrists and/or other health professionals concerning both psychiatric and non-psychiatric care. The State of [place] legally allows patients to specify about healthcare decisions by completing the following document: TaxDiscussions.tn. When completed, the document allows a patient (in this case, you) to designate  a health care representative. The health care representative acts as the patient?s advocate when he/she is legally incapacitated and unable to make decisions for him/herself about personal health care. A health care representative must be designated by the patient in a way that complies with the state?s laws for the state in which the patient receives care. During your hospitalization, you were asked  about a medical advanced directive. Below lists the information for your wishes regarding a medical advanced directive: Advance Directive 05/11/2019 06/18/2018 Has Advance Directive? No, Information provided No Advance Directive Information Given yes - Reason the patient will/did not complete an Advance Directive during the Inpatient stay - patient declined Patient expressed wishes No No Advance Directive (Medical Healthcare) no no Advanced Directive Comment - patient declined You were given information about a psychiatric advanced directive during the hospitalization. Certain decisions such as involuntary treatment (medications and electroconvulsive therapy) are not legally recognized in the state of [place]. Contact information for questions about your hospitalization:24-hour/7-day contact informationPlease call the following number if you have questions relating to your hospital stay, or need to reach a provider on your treatment team for emergencies: [facility], inpatient psychiatry: [phone] psychiatry inpatient unit [place] 9: [phone] psychiatry inpatient unit [facility] 8: [phone] for Psychiatric Follow-up Care: In the After Visit Summary, you will find an appointment (or appointments) listed for psychiatric follow-up care for future treatment and/or other supportive services to maintain optimal health. The treatment plan after hospitalization was created with consideration your personal goals of care and treatment preferences. In addition, the care plan developed reflects the most appropriate next level of patient care for you after you leave the hospital, based on careful assessment of the mental health resources currently available in the community. Additional Instructions Regarding Medications:  Any medications started while you were being treated on the inpatient psychiatric hospital setting should be continued as instructed in this document. The medications prescribed should be continued for the next 6 months to 1 year (unless otherwise noted on the medication instructions or prescription), or until you come to a decision with a psychiatrist, APRN, or similar provider to adjust your medications before then. Please make sure to go to the follow-up psychiatric appointment listed in this document to obtain medications before your medications run out. If you are on any of these specific medications (Depakote, Lithium, Carbamazepine, and Clozapine), you will need laboratory monitoring and close follow-up with your outpatient psychiatric provider.

## 2019-05-17 NOTE — Plan of Care
Plan of Care Overview/ Patient Status    Assumed pt care 1900-0700. Pt A&OX4. VSS on RA. NAD noted. Pt compliant with medications administration. Pt anxious medicated per MAR with good effect. Pt slept throughout night. Denies SI/hi/avh. Calm and cooperative with care. Safety maintained, call bell within reach. Video monitoring continued. Problem: Adult Inpatient Plan of CareGoal: Plan of Care ReviewOutcome: Interventions implemented as appropriateGoal: Patient-Specific Goal (Individualized)Outcome: Interventions implemented as appropriateGoal: Absence of Hospital-Acquired Illness or InjuryOutcome: Interventions implemented as appropriateGoal: Optimal Comfort and WellbeingOutcome: Interventions implemented as appropriateGoal: Readiness for Transition of CareOutcome: Interventions implemented as appropriate Problem: Adult Behavioral Health Plan of CareGoal: Plan of Care ReviewOutcome: Interventions implemented as appropriateGoal: Patient-Specific Goal (Individualization)Outcome: Interventions implemented as appropriateGoal: Adheres to Safety Considerations for Self and OthersOutcome: Interventions implemented as appropriateGoal: Absence of New-Onset Illness or InjuryOutcome: Interventions implemented as appropriateGoal: Optimized Coping Skills in Response to Life StressorsOutcome: Interventions implemented as appropriateGoal: Develops/Participates in Biomedical scientist to Support Successful TransitionOutcome: Interventions implemented as appropriate Problem: Psychiatry Goal & Intervention PlanGoal: AggressionDescription: Signs and symptoms will be absent or manageable by discharge/transition of careOutcome: Interventions implemented as appropriateNote: Interventions from the Interdisciplinary Treatment Plan (SPOC)1.  No value filed. No value filed.2.  No value filed. No value filed.3.  No value filed. No value filed.4.  No value filed. No value filed.5.  No value filed. No value filed.Goal: Mood AlterationDescription: Signs and symptoms will be absent or manageable by discharge/transition of careOutcome: Interventions implemented as appropriateNote: Interventions from the Interdisciplinary Treatment Plan (SPOC)1.  Facilitate goal setting Throughout hospitalization2.  Provide counseling and emotional support As needed3.  Encourage healthy eating and sleeping patterns and self-care activities Throughout hospitalization4.  No value filed. No value filed.5.  No value filed. No value filed.Goal: Suicide RiskDescription: Signs and symptoms will be absent or manageable by discharge/transition of careOutcome: Interventions implemented as appropriateNote: Interventions from the Interdisciplinary Treatment Plan (SPOC)1.  Promote Identification of triggers Prior to discharge2.  Provide safe environment Throughout hospitalization3.  Encourage identification/practice/mastery of adaptive coping skills Q shift4.  No value filed. No value filed.5.  No value filed. No value filed. Problem: InfectionGoal: Infection Symptom ResolutionOutcome: Interventions implemented as appropriate Problem: AnxietyGoal: Anxiety Reduction or ResolutionOutcome: Interventions implemented as appropriate Problem: DepressionGoal: Improved MoodOutcome: Interventions implemented as appropriate Problem: Oral Intake InadequateGoal: Improved Oral IntakeOutcome: Interventions implemented as appropriate

## 2019-05-17 NOTE — Plan of Care
Plan of Care Overview/ Patient Status    SW Discharge note:The patient will be discharge today via private car, the patient stated her car is parked in one of the parking lots outside the hospital.  The patient will be returning to live with her grandmother, Marina Gravel.  SW spoke to Ms. Berrios and she confirmed the patient will be staying with her.  SW has informed the patient to follow up with her [facility] worker, Ms. Dory Peru.  SW referred the patient to [facility] [phone], appointment scheduled for Monday 05/24/19 at 2 pm via video visits.  The patient verbalized agreement and understanding of her discharge plan.  I discussed with a staff member at the receiving facility about our 24 hour/ 7 day contact information for our inpatient hospital which can be used to obtain pending studies.  I also discussed the patient's plan of care is to receive follow up care from the facility noted above.  Team aware and 45 minutes spent.[address]. [place], [phone]

## 2019-05-18 NOTE — Other
Inpatient Interdisciplinary Treatment PlanAmanda [id number]/5/2021Overview: Current Diagnosis:Active Hospital Problems  Diagnosis ? Principal Problem/Diagnosis:  Other bipolar disorder (HC Code) [F31.89] ? 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 Homelessness [Z59.0] ? MDD (major depressive disorder), recurrent severe, without psychosis (HC Code) [F33.2] Level of Care/Treatment Track: Inpatient HospitalizationTreatment Modalities: Therapeutic Recreation;Pharmacological Management;Activities Therapy;Family Therapy;Group Psychotherapy;Relapse PreventionPatient Strengths: cooperative with interview;expresses reasons to live/engage in productive activity;willing to ask for help when symptoms exacerbate;good pre-morbid functioning;expresses motivation to not act on destructive behaviors;able to express feelings;positive social supports committed and able to help;sense of responsibility to family/others;able to define needs;able to engage others;positive reality testing;able to live independentlyPatient and/or Family Stated Short Term Personal Goal(s): feel betterPatient and/or Family Stated Long Term Personal Goal(s): go homeGoal of Treatment #1: ensure safety of patient and othersGoal of Treatment #2: optimize medication regimenGoal of Treatment #3: establish outpatient treatment plan to prevent relapseThe patient did participate in the development of this Treatment Plan.Active Multidisciplinary Problems: INTERDISCIPLINARY PROBLEM LISTMood Alteration:  ActiveDescriptive Behaviors:  Patient reported to the ED with exacerbation of depression and anxiety for the last month due to worsening stressors. Patient has psychiatric hx of depression and anxiety. Recently reports decreased energy, unwanted 13 pound weight loss, poor concentration, isolative, afraid to go in public, pacing, poor sleep, and thinning hair. Long Term Goal(s):  Medication compliance/adjustments, Demonstration of mood stability, Demonstrated use of positive/appropriate coping mechanisms, Re-engagement with vocational/educational activities, Caregiver engagement in discharge plan/follow-up treatment and Prevent recurrence/exacerbation of symptoms     Target Date:  03/10/2021Short Term Goal/Objective:  Patient will report/demonstrate a decrease in severity and/or frequency of mood alteration as evidenced by ... Rating anxiety and depression less than a 3 on a 1-10 scale     Target Date: 05/18/2019     Status:  NewShort Term Goal/Objective:  Patient will identify/practice/demonstrate the use of adaptive coping skills as evidenced by ... Attendign and participating in group therapy     Target Date: 05/18/2019     Status:  NewShort Term Goal/Objective:  Patient will express hope for the future as evidenced by? Making at least 2 goal oriented statements prior to discharge     Target Date: 05/18/2019     Status:  NewIntervention/Frequency:  Facilitate goal setting Throughout hospitalizationIntervention/Frequency:  Provide counseling and emotional support As neededIntervention/Frequency:  Encourage healthy eating and sleeping patterns and self-care activities Throughout hospitalizationSuicide Risk:  ActiveDescriptive Behaviors:  Patient reports an increased amount of anxiety and depression upon approach to the ED. Patient endorses suicidal history of overdosing on pills and self harm behavior of cutting both arms. Long Term Goal(s):  Re-establish safety to self and others, Medication compliance/adjustments, Demonstration of mood stability, Re-engagement with vocational/educational activities, Demonstrated use of positive/appropriate coping mechanisms and Prevent recurrence/exacerbation of symptoms     Target Date:  03/13/2021Short Term Goal/Objective:  Patient will comply with prescribed treatment and safety planning as evidenced by. . . Taking medications as prescribed adn completing Safety Plan prior to discharge     Target Date: 05/18/2019     Status:  NewShort Term Goal/Objective:  Patient will report a decrease in suicidal ideation as evidenced by. . . Denying suicidal ideation or thoughts of self harm     Target Date:  05/18/2019     Status:  NewShort Term Goal/Objective:  Patient will express feelings and needs to others as evidenced by. . . Making needs known appropriately and communicating thoughts of self harm to staff     Target Date:  05/18/2019     Status:  NewIntervention/Frequency:  Promote Identification of triggers Prior to dischargeIntervention/Frequency:  Provide safe environment Throughout hospitalizationIntervention/Frequency:  Encourage identification/practice/mastery of adaptive coping skills Q shiftNursing/Ancillary Care Plan Problems   Problem: Adult Behavioral Health Plan of Care   Goal: Plan of Care Review     Goal: Patient-Specific Goal (Individualization)     Goal: Adheres to Safety Considerations for Self and Others     Goal: Absence of New-Onset Illness or Injury     Goal: Optimized Coping Skills in Response to Life Stressors     Goal: Develops/Participates in Therapeutic Alliance to Support Successful Transition       Problem: Adult Inpatient Plan of Care   Goal: Plan of Care Review     Goal: Patient-Specific Goal (Individualized)     Goal: Absence of Hospital-Acquired Illness or Injury     Goal: Optimal Comfort and Wellbeing     Goal: Readiness for Transition of Care       Problem: Infection   Goal: Infection Symptom Resolution       Problem: Psychiatry Goal & Intervention Plan   Goal: Aggression   Description: Signs and symptoms will be absent or manageable by discharge/transition of care    Goal: Mood Alteration Description: Signs and symptoms will be absent or manageable by discharge/transition of care    Goal: Suicide Risk   Description: Signs and symptoms will be absent or manageable by discharge/transition of care      Medical / Psychological Assessments: Medications:Assessment of medication side effects and/or adverse medication reactions is ongoing     Current Facility-Administered Medications Medication Dose Route Frequency Provider Last Rate Last Admin ? acetaminophen (TYLENOL) tablet 325 mg  325 mg Oral Q6H PRN Mina Marble, MD     ? acetaminophen (TYLENOL) tablet 650 mg  650 mg Oral Q6H PRN Mina Marble, MD     ? acetaminophen (TYLENOL) tablet 975 mg  975 mg Oral Q6H PRN Mina Marble, MD     ? benztropine (COGENTIN) tablet 1 mg  1 mg Oral Q4H PRN Greig Castilla, PA     ? diphenhydrAMINE (BENADRYL) injection 50 mg  50 mg Intramuscular Q4H PRN Greig Castilla, PA     ? hydrOXYzine (ATARAX) tablet 25 mg  25 mg Oral Q4H PRN Greig Castilla, PA   25 mg at 05/13/19 1315 ? lithium (ESKALITH) extended release tablet 450 mg  450 mg Oral BID WC Mina Marble, MD   450 mg at 05/14/19 1610 ? OLANZapine (ZyPREXA) 10 mg in water for injection, sterile (5 mg/mL) IM vial  10 mg Intramuscular TID PRN Mina Marble, MD     ? ondansetron (ZOFRAN-ODT) disintegrating tablet 4 mg  4 mg Translingual Q8H PRN Vozzella, Dominic, PA   4 mg at 05/12/19 1709 ? polyethylene glycol (MIRALAX) packet 17 g  17 g Oral Nightly PRN Greig Castilla, PA     ? QUEtiapine (SEROquel) Immediate Release tablet 100 mg  100 mg Oral Q4H PRN Greig Castilla, PA   100 mg at 05/14/19 0849 ? QUEtiapine (SEROquel) Immediate Release tablet 200 mg  200 mg Oral Nightly Vozzella, Dominic, PA   200 mg at 05/13/19 2130 ? QUEtiapine (SEROquel) Immediate Release tablet 50 mg  50 mg Oral Q4H PRN Greig Castilla, PA   50 mg at 05/13/19 1541 ? senna-docusate (SENNA-PLUS) 8.6-50 mg per tablet 1 tablet  1 tablet Oral DAILY PRN Greig Castilla, PA     ? traZODone (DESYREL) tablet 50 mg  50 mg Oral  Nightly PRN Greig Castilla, PA     ? zolpidem Remus Loffler) tablet 5 mg  5 mg Oral Nightly Mina Marble, MD   5 mg at 05/13/19 2130 Plan for Medication Changes: No changes at this timeMedical Monitoring:No monitoring needed at this timeConsult / Referral Made:No referrals / consults requested at this timePsychological Testing Requested:No testing requestedDischarge Planning: Anticipated Discharge Date: 03/10/2021Anticipated Discharge Level of Care/ Disposition:Intensive Outpatient Program

## 2019-05-18 NOTE — Other
Inpatient Interdisciplinary Treatment PlanAmanda [id number]/7/2021Overview: Current Diagnosis:Active Hospital Problems  Diagnosis ? Principal Problem/Diagnosis:  Other bipolar disorder (HC Code) [F31.89] ? 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 Homelessness [Z59.0] ? MDD (major depressive disorder), recurrent severe, without psychosis (HC Code) [F33.2] Level of Care/Treatment Track: Inpatient HospitalizationTreatment Modalities: Therapeutic Recreation;Pharmacological Management;Activities Therapy;Family Therapy;Group Psychotherapy;Relapse PreventionPatient Strengths: cooperative with interview;expresses reasons to live/engage in productive activity;willing to ask for help when symptoms exacerbate;good pre-morbid functioning;expresses motivation to not act on destructive behaviors;able to express feelings;positive social supports committed and able to help;sense of responsibility to family/others;able to define needs;able to engage others;positive reality testing;able to live independentlyPatient and/or Family Stated Short Term Personal Goal(s): feel betterPatient and/or Family Stated Long Term Personal Goal(s): go homeGoal of Treatment #1: ensure safety of patient and othersGoal of Treatment #2: optimize medication regimenGoal of Treatment #3: establish outpatient treatment plan to prevent relapseThe patient did participate in the development of this Treatment Plan.Active Multidisciplinary Problems: INTERDISCIPLINARY PROBLEM LISTMood Alteration:  ActiveDescriptive Behaviors:  Patient reported to the ED with exacerbation of depression and anxiety for the last month due to worsening stressors. Patient has psychiatric hx of depression and anxiety. Recently reports decreased energy, unwanted 13 pound weight loss, poor concentration, isolative, afraid to go in public, pacing, poor sleep, and thinning hair. Long Term Goal(s):  Medication compliance/adjustments, Demonstration of mood stability, Demonstrated use of positive/appropriate coping mechanisms, Re-engagement with vocational/educational activities, Caregiver engagement in discharge plan/follow-up treatment and Prevent recurrence/exacerbation of symptoms     Target Date:  03/10/2021Short Term Goal/Objective:  Patient will report/demonstrate a decrease in severity and/or frequency of mood alteration as evidenced by ... Rating anxiety and depression less than a 3 on a 1-10 scale     Target Date: 05/18/2019     Status:  Mostly achievedShort Term Goal/Objective:  Patient will identify/practice/demonstrate the use of adaptive coping skills as evidenced by ... Practicing and verbalizing importance of coping skills such as reading or coloring when feeling anxious or depressed     Target Date: 05/18/2019     Status:  Mostly achievedShort Term Goal/Objective:  Patient will express hope for the future as evidenced by? Making at least 2 goal oriented statements prior to discharge     Target Date: 05/18/2019     Status:  Mostly achievedIntervention/Frequency:  Facilitate goal setting Throughout hospitalizationIntervention/Frequency:  Provide counseling and emotional support As neededIntervention/Frequency:  Encourage healthy eating and sleeping patterns and self-care activities Throughout hospitalizationSuicide Risk:  ActiveDescriptive Behaviors:  Patient reports an increased amount of anxiety and depression upon approach to the ED. Patient endorses suicidal history of overdosing on pills and self harm behavior of cutting both arms. Long Term Goal(s):  Re-establish safety to self and others, Medication compliance/adjustments, Demonstration of mood stability, Re-engagement with vocational/educational activities, Demonstrated use of positive/appropriate coping mechanisms and Prevent recurrence/exacerbation of symptoms     Target Date:  03/13/2021Short Term Goal/Objective:  Patient will comply with prescribed treatment and safety planning as evidenced by. . . Taking medications as prescribed and completing Safety Plan prior to discharge     Target Date: 05/18/2019     Status:  Partially achievedShort Term Goal/Objective:  Patient will report a decrease in suicidal ideation as evidenced by. . . Denying suicidal ideation or thoughts of self harm     Target Date:  05/16/2019     Status:  Achieved and ongoingShort Term Goal/Objective:  Patient will express feelings and needs to others as evidenced by. . . Making needs  known appropriately and communicating thoughts of self harm to staff     Target Date:  05/16/2019     Status:  Achieved and ongoingIntervention/Frequency:  Promote Identification of triggers Prior to dischargeIntervention/Frequency:  Provide safe environment Throughout hospitalizationIntervention/Frequency:  Encourage identification/practice/mastery of adaptive coping skills Q shiftNursing/Ancillary Care Plan Problems   Problem: Adult Behavioral Health Plan of Care   Goal: Plan of Care Review     Goal: Patient-Specific Goal (Individualization)     Goal: Adheres to Safety Considerations for Self and Others     Goal: Absence of New-Onset Illness or Injury     Goal: Optimized Coping Skills in Response to Life Stressors     Goal: Develops/Participates in Therapeutic Alliance to Support Successful Transition       Problem: Adult Inpatient Plan of Care   Goal: Plan of Care Review     Goal: Patient-Specific Goal (Individualized)     Goal: Absence of Hospital-Acquired Illness or Injury     Goal: Optimal Comfort and Wellbeing     Goal: Readiness for Transition of Care       Problem: Anxiety   Goal: Anxiety Reduction or Resolution       Problem: Depression   Goal: Improved Mood       Problem: Infection   Goal: Infection Symptom Resolution       Problem: Oral Intake Inadequate   Goal: Improved Oral Intake       Problem: Psychiatry Goal & Intervention Plan   Goal: Aggression   Description: Signs and symptoms will be absent or manageable by discharge/transition of care    Goal: Mood Alteration   Description: Signs and symptoms will be absent or manageable by discharge/transition of care    Goal: Suicide Risk   Description: Signs and symptoms will be absent or manageable by discharge/transition of care      Medical / Psychological Assessments: Medications:Assessment of medication side effects and/or adverse medication reactions is ongoing     Current Facility-Administered Medications Medication Dose Route Frequency Provider Last Rate Last Admin ? acetaminophen (TYLENOL) tablet 325 mg  325 mg Oral Q6H PRN Mina Marble, MD     ? acetaminophen (TYLENOL) tablet 650 mg  650 mg Oral Q6H PRN Mina Marble, MD     ? acetaminophen (TYLENOL) tablet 975 mg  975 mg Oral Q6H PRN Mina Marble, MD     ? benztropine (COGENTIN) tablet 1 mg  1 mg Oral Q4H PRN Greig Castilla, PA     ? diphenhydrAMINE (BENADRYL) injection 50 mg  50 mg Intramuscular Q4H PRN Greig Castilla, PA     ? hydrOXYzine (ATARAX) tablet 25 mg  25 mg Oral Q4H PRN Greig Castilla, PA   25 mg at 05/16/19 0825 ? lithium (ESKALITH) extended release tablet 450 mg  450 mg Oral BID WC Mina Marble, MD   450 mg at 05/16/19 9629 ? OLANZapine (ZyPREXA) 10 mg in water for injection, sterile (5 mg/mL) IM vial  10 mg Intramuscular TID PRN Mina Marble, MD   10 mg at 05/15/19 5284 ? ondansetron (ZOFRAN-ODT) disintegrating tablet 4 mg  4 mg Translingual Q8H PRN Vozzella, Dominic, PA   4 mg at 05/12/19 1709 ? polyethylene glycol (MIRALAX) packet 17 g  17 g Oral Nightly PRN Greig Castilla, PA     ? QUEtiapine (SEROquel) Immediate Release tablet 100 mg  100 mg Oral Q4H PRN Greig Castilla, PA   100 mg at 05/14/19 0849 ? QUEtiapine (SEROquel) Immediate Release tablet 200 mg  200 mg  Oral BID Mina Marble, MD   200 mg at 05/15/19 2036 ? QUEtiapine (SEROquel) Immediate Release tablet 50 mg  50 mg Oral Q4H PRN Greig Castilla, PA   50 mg at 05/13/19 1541 ? senna-docusate (SENNA-PLUS) 8.6-50 mg per tablet 1 tablet  1 tablet Oral DAILY PRN Greig Castilla, PA     ? traZODone (DESYREL) tablet 50 mg  50 mg Oral Nightly PRN Greig Castilla, PA     ? zolpidem (AMBIEN) tablet 5 mg  5 mg Oral Nightly Mina Marble, MD   5 mg at 05/15/19 2036 Plan for Medication Changes: No changes at this timeMedical Monitoring:No monitoring needed at this timeConsult / Referral Made:No referrals / consults requested at this timePsychological Testing Requested:No testing requestedDischarge Planning: Anticipated Discharge Date: 03/15/2021Anticipated Discharge Level of Care/ Disposition:Intensive Outpatient Program Review: Patient is compliant with scheduled medications and is able to make needs known to staff appropriately. Patient is able to make goal oriented statements and utilizes coping skills throughout the day. Denies suicidal ideations and thoughts of self harm. Staff will request patient complete Safety Plan prior to discharge date. Anxiety level has decreased since admission to the hospital. Will continue to monitor for adverse effects of medications.

## 2019-05-21 NOTE — Discharge Summary
[facility] [place] [facility]	Psychiatry Discharge SummaryPatient Data:  Patient Name: Zoe Scott Age: 24 y.o. DOB: 05-30-1995	 MRN: [id number]	 Admit date: 3/2/2021Discharge date: 3/8/2021Current Attending and Treatment Team Members at the time of Discharge: Treatment Team   Provider Role Contact phone  Mina Marble, MD Attending Provider [phone]   [facility] Problems  Diagnosis ? Principal Problem/Diagnosis:  Other bipolar disorder (HC Code) [F31.89] ? Cannabis abuse [F12.10] Reason for Admission  Reason for Hospitalization/Treatment (Plain Language Statement): worsening depression and anxiety in the context of psychosocial stressors, cannabis abuse, and medication/treatment nonadherenceHistory of Present Illness  As per note completed by Dr. Mina Marble, MD on 05/13/2019:Interim History: Patient was seen and examined. Case reviewed with staff and chart reviewed. ?CC: not good at all?Pt interviewed in her room, sitting on her bed. Pt states that she does not feel good at all. Pt then showed me a hand-written letter expressing her concerns and demonstrating how she feels that no one is taking care of me. Pt reported that she was in bed for 48 hours but no one came to encourage me to get out of bed or get breakfast. She feels so sad and depressed and don't want to do nothing. She is worried because she goes back and forth... all over the place when she is at home and performing different activities, often forgetful. Pt states that she feels numb everyday.. I wish I was someone else. Reports difficulty sleeping, as she woke up at 3am with racing thoughts and a lot of ideas. She states that Seroquel is like water and is not helping her anxiety or depression. Endorses lack of appetite, as nothing interests her to eat. When asked if the food itself bothers her and if changing the food type woould help, she states that it doesn't matter what food it is. I force myself to eat a little but I just can't and I am still starving. Reports vomiting yesterday after taking Effexor which has happened in the past. No SI/HI/AVH. She states that group therapy has been fun. Hospital Course  Patient was admitted voluntarily to [facility] for treatment and management of Other Bipolar Disorder. Patient was later placed on COVID-19 quarantine precautions during her hospitalization due to an exposure to a COVID-19 (+) patient in the emergency department, and was transferred to [facility] 9 inpatient psychiatric COVID unit for this precaution, despite having tested negative for COVID-19 twice during her admission. On admission, patient had a history and physical exam. Please see below for more specific findings. A complete laboratory screen  [CMP, TSH, CBC w. Diff] was collected. All labwork results were grossly WNL. A urine toxicology was obtained, which was positive for cannabinoids. The patient's medical status was monitored via daily vital sign collection.During the patient's psychiatric admission, she improved while ordered Lithium for unstable mood, Seroquel for unstable mood, and Ambien for insomnia. Effexor was discontinued early in her hospitalization following an episode of emesis possibly secondary to this medication and also due to risk of induction of mania. Patient's regimen was well-tolerated and titrated to therapeutic effect, with dosage schedule as noted below.   Outside of pharmacotherapy, the patient participated in therapeutic group activities while on [facility] and met individually with unit clinicians. In addition, patient was counseled on substance use, how this could impact her mental health, and the importance of pursuing substance abuse treatment following discharge. Prior to leaving, patient verbalized motivation to abstain from substances and to continue working on sobriety together with her mental health.  Patient improved throughout her hospitalization and demonstrated a reduction in presenting symptoms. Her mood was more euthymic. Patient was no longer having suicidal thoughts or exhibiting behavioral outbursts. Thought process was clear and goal directed. Of note on 3/4, patient has to be placed on isolation precaution after being exposed to another patient in the ER who tested COVID positive; reason for isolation quarantine exposed COVID which was explained in detail to the patient including the protocol and restrictions that will need to be put in place which she verbalized her understanding. However, on 3/5 she displayed severe temper outbursts for being in isolation. Patient showed extreme reactions to situational changes - she was extremely hostile, her anger was intense, inappropriate and difficult to control and she yelled, cursed, and threatened the Attending MD. At the time, she demanded discharge and signed a 3-day paper. She was then transferred to [facility] so at least she has access to a TV in her room and this helped significantly in controlling her outbursts. Throughout her hospitalization she remained adherent to medications, tolerated the medications well with good response. She reported improvement in her symptomatology, she was more cooperative, pleasant, and apologized for her inappropriate aggressive behaviors. She denied suicidality, homicidality, delusions, or hallucinations. She spoke to her grandmother and would like her home to live with her. She understood the requirement to remain in quarantine after discharge.  At this point, it was no longer felt that the patient required an acute, inpatient, psychiatric level of care. Thus, patient was discharged home with the follow up listed below. Alcohol Use Assessment: Female score less than 3 - Use not consistent with hazardous drinking or an active alcohol use disorderPMH PSH Past Medical History: Diagnosis Date ? Anxiety  ? Depression  ? Strep throat  ? Suicide attempt [facility] Code)   April 2020: Ingestion of pills and cut wrists  Past Surgical History: Procedure Laterality Date ? DENTAL SURGERY   ? HERNIA REPAIR Right age 66  RLQ area ? HERNIA REPAIR   ? TONSILLECTOMY Bilateral 2013 ? TONSILLECTOMY    Social History Family History Social History Tobacco Use ? Smoking status: Never Smoker ? Smokeless tobacco: Never Used Substance Use Topics ? Alcohol use: No  History reviewed. No pertinent family history. Psychiatric History  Allergies  Psychiatric Treatment History ? Inpatient Hospitalization Yes [facility] 06/2018 ? Outpatient Treatment No no current treatment  No Known Allergies Pertinent Physical FindingsAs per physical examination completed by Wilnette Kales, PA-C on 05/12/2019:Universal Psychiatric Evaluation/History & Physical?Physical Exam Form Not Required for Psychiatric Consults?Patient Name Zoe Scott                   Date:  05/12/2019 8:59 AM DOB: [birth year]                                          ?Physical Examination:GENERAL:VITAL SIGNS: Well appearing female in no acute distressI have reviewed the patient's current vital signs as documented in the patient's EMR.  Last 24 hours: Temp:  [97.7 ?F (36.5 ?C)-98.6 ?F (37 ?C)] 97.7 ?F (36.5 ?C)Pulse:  [59-74] 60Resp:  [18-20] 18BP: (94-111)/(59-65) 111/59SpO2:  [98 %-100 %] 99 % EYES Anicteric sclera, EOMI, PERRLA. Conjunctivae pink. ENT: Oral Cavity is clear no mucosal lesions were seen. No evidence of thrush. Uvula is midline. NECK: No palpable thyroid nodules. Neck Supple. Trachea midline. HEART: Normal S1 and S2. RRR, no murmurs, rubs,  or gallops.  LUNGS: Clear to auscultation. No wheezes or rales. ABDOMEN: Soft and non-tender with positive bowel sounds, no hepatomegaly or splenomegaly. No guarding or rigidity. MUSCULOSKELETAL: No axial skeletal tenderness, edema or clubbing SKIN There are no suspicious lesions, no nodules, or signs of infection. Right wrist question of previous scar vs scratch mark well healed no erythema.  LYMPH NODES: No cervical, supraclavicular, or axillary adenopathy EXTREMITIES: Peripheral pulses intact. No edema.   NEUROLOGICAL: Alert and oriented x3, CN 2-12 intact, muscle strength and function:5/5, DTR: normal  Romberg negative, cerebellar function grossly intact. Gait intact. No cogwheeling or tremor. No clonus.  Breast Exam:  Deferred Rectal Exam:  Deferred Genital Exam:  Deferred ROS - Nausea?PMHx: No acute or chronic pertient issues per patient and chart review PSHx: No acute or chronic pertient issues per patient and chart review?Allergies:  NKDA?Labs:CMP - grossly normalLipids - orderedTSH - grossly normalB12 - orderedCBC - grossly normalA1c - orderedUtox - pending hcg - <1 unremarkable?EKG - 424 QTC, NSR with sinus arrythmiaCOVID - negativeImaging - None recent??Findings:?Emesis - Per RN informed that patient had emesis x1 this morning. Continue to monitor and encourage po intake as tolerated. Lab work indicates no abnormalities. Could consider Lexapro administration this morning as a potential etiology especially if patient did not consume breakfast. ?Manage Psychiatrically ?Wilnette Kales, PA-CSupervising Physician: Dr. Mina Marble, MDLaboratory Results:Lab Results this Admission   Results for orders placed or performed during the hospital encounter of 05/11/19 Comprehensive metabolic panel (Collected: 05/17/2019  9:07 AM)  Collection Time: 05/17/19  9:07 AM Result Value Ref Range  Sodium 135 (L) 136 - 144 mmol/L  Potassium 3.6 3.3 - 5.1 mmol/L  Chloride 99 98 - 107 mmol/L  CO2 26 20 - 30 mmol/L  Anion Gap 10 7 - 17  Glucose 49 (LL) 70 - 100 mg/dL  BUN 14 6 - 20 mg/dL  Creatinine 1.61 0.96 - 1.30 mg/dL  Calcium 9.7 8.8 - 04.5 mg/dL  BUN/Creatinine Ratio 40.9 8.0 - 23.0  Total Protein 7.7 6.6 - 8.7 g/dL  Albumin 4.8 3.6 - 4.9 g/dL  Total Bilirubin 0.2 <=8.1 mg/dL  Alkaline Phosphatase 44 9 - 122 U/L  Alanine Aminotransferase (ALT) 15 10 - 35 U/L  Aspartate Aminotransferase (AST) 32 10 - 35 U/L  Globulin 2.9 g/dL  A/G Ratio 1.7 1.0 - 2.2  eGFR (Afr Amer) >60 >60 mL/min/1.51m2  eGFR (NON African-American) >60 >60 mL/min/1.66m2 Lithium level (Collected: 05/17/2019  7:58 AM)  Collection Time: 05/17/19  7:58 AM Result Value Ref Range  Lithium Lvl 0.92 0.60 - 1.20 mmol/L Urinalysis with culture reflex     ([facility]) (Collected: 05/15/2019  6:39 PM)  Collection Time: 05/15/19  6:39 PM  Specimen: Urine Result Value Ref Range  Clarity, UA Clear Clear  Color, UA Yellow Yellow  Specific Gravity, UA 1.016 1.005 - 1.030  pH, UA 7.0 5.5 - 7.5  Protein, UA Negative Negative-Trace  Glucose, UA Negative Negative  Ketones, UA Negative Negative  Blood, UA Negative Negative  Bilirubin, UA Negative Negative  Leukocytes, UA Positive (A) Negative  Nitrite, UA Negative Negative  Urobilinogen, UA <2.0 <=2.0 EU/dL UA reflex to culture (Collected: 05/15/2019  6:39 PM)  Collection Time: 05/15/19  6:39 PM  Specimen: Urine Result Value Ref Range  Reflex Urine Culture See Comment  Urine microscopic     ([facility]) (Collected: 05/15/2019  6:39 PM)  Collection Time: 05/15/19  6:39 PM Result Value Ref Range  Epithelial Cells Moderate (A) None-Few /LPF  Hyaline Casts, UA 1 0 - 3 /LPF  Bacteria, UA None None-Few /HPF  WBC/HPF, UA 5 0 - 5 /HPF  RBC/HPF, UA 1 0 - 2 /HPF Urine culture (Collected: 05/15/2019  6:39 PM)  Collection Time: 05/15/19  6:39 PM  Specimen: Urine Result Value Ref Range  Urine Culture, Routine 1,000 CFU/mL Beta-Hemolytic Streptococcus Group B (A)  SARS CoV-2 (COVID-19) RNA - [facility]) (Collected: 05/13/2019  4:46 PM)  Collection Time: 05/13/19  4:46 PM  Specimen: Nasopharynx; Viral Result Value Ref Range  SARS-CoV-2 RNA (COVID-19) Negative Negative Toxicology screen, urine     (BH L) (Collected: 05/12/2019  5:12 PM)  Collection Time: 05/12/19  5:12 PM Result Value Ref Range  Alcohol Urine Negative Negative  Amphetamine Screen, Urine Negative Negative  Benzodiazepine Screen, Urine Negative Negative  Cannabinoid Screen, Urine Positive (A) Negative  Opiate Screen, Urine Negative Negative  6-Acetylmorphine Negative Negative  Oxycodone Screen, Urine Negative Negative  Phencyclidine Screen, Urine Negative Negative  Cocaine Metabolites, Ur Negative Negative  Methadone Metabolite Screen, Urine, No Conf. Negative Negative  Barbiturate Screen, Urine Negative Negative  Propoxyphene Negative Negative  Drugs Of Abuse Note See Comment  Hemoglobin A1c (Collected: 05/12/2019  4:44 PM)  Collection Time: 05/12/19  4:44 PM Result Value Ref Range  Hemoglobin A1c 5.1 4.0 - 5.6 %  Estimated Average Glucose mg/dL 875 mg/dL Vitamin I43 (Collected: 05/12/2019  4:44 PM)  Collection Time: 05/12/19  4:44 PM Result Value Ref Range  Vitamin B12 659 232-1,245 pg/mL SARS CoV-2 (COVID-19) RNA-Rapid Admit Test (Collected: 05/11/2019  3:41 PM)  Collection Time: 05/11/19  3:41 PM  Specimen: Nasopharynx; Viral Result Value Ref Range  SARS-CoV-2 RNA (COVID-19) Not Detected Not Detected EKG (Collected: 05/11/2019  3:04 PM)  Collection Time: 05/11/19  3:04 PM Result Value Ref Range  Heart Rate 67 bpm  QRS Duration 90 ms  Q-T Interval 402 ms  QTC Calculation(Bezet) 424 ms  P Axis 11 deg  R Axis 38 deg  T Axis 29 deg  P-R Interval 140 msec  ECG - SEVERITY Borderline ECG severity Comprehensive metabolic panel (Collected: 05/11/2019  3:01 PM)  Collection Time: 05/11/19  3:01 PM Result Value Ref Range  Sodium 140 136 - 144 mmol/L  Potassium 4.0 3.3 - 5.1 mmol/L Chloride 105 98 - 107 mmol/L  CO2 23 20 - 30 mmol/L  Anion Gap 12 7 - 17  Glucose 83 70 - 100 mg/dL  BUN 10 6 - 20 mg/dL  Creatinine 3.29 5.18 - 1.30 mg/dL  Calcium 9.2 8.8 - 84.1 mg/dL  BUN/Creatinine Ratio 66.0 8.0 - 23.0  Total Protein 7.1 6.6 - 8.7 g/dL  Albumin 4.5 3.6 - 4.9 g/dL  Total Bilirubin 0.5 <=6.3 mg/dL  Alkaline Phosphatase 36 9 - 122 U/L  Alanine Aminotransferase (ALT) 10 10 - 35 U/L  Aspartate Aminotransferase (AST) 21 10 - 35 U/L  Globulin 2.6 g/dL  A/G Ratio 1.7 1.0 - 2.2  eGFR (Afr Amer) >60 >60 mL/min/1.95m2  eGFR (NON African-American) >60 >60 mL/min/1.38m2 hCG, quantitative (Collected: 05/11/2019  3:01 PM)  Collection Time: 05/11/19  3:01 PM Result Value Ref Range  hCG, Quantitative <1 See Comment mIU/mL CBC auto differential (Collected: 05/11/2019  3:01 PM)  Collection Time: 05/11/19  3:01 PM Result Value Ref Range  WBC 8.3 4.8 - 10.8 x1000/?L  RBC 4.4 3.5 - 5.5 M/?L  Hemoglobin 12.9 12.0 - 15.0 g/dL  Hematocrit 01.6 01.0 - 48.0 %  MCV 86.8 81.0 - 99.0 fL  MCHC  33.7 33.0 - 37.0 g/dL  RDW-CV 16.1 09.6 - 04.5 %  Platelets 192 120 - 450 x1000/?L  MPV 10.9 8.0 - 12.0 fL  ANC (Abs Neutrophil Count) 5.3 2.2 - 7.2 x 1000/?L  Neutrophils 63.6 45.0 - 90.0 %  Lymphocytes 29.1 10.0 - 50.0 %  Absolute Lymphocyte Count 2.4 0.5 - 5.4 x 1000/?L  Monocytes 5.9 3.0 - 11.0 %  Monocyte Absolute Count 0.5 0.1 - 1.2 x 1000/?L  Eosinophils 0.5 0.0 - 4.0 %  Eosinophil Absolute Count 0.0 0.0 - 0.4 x 1000/?L  Basophil 0.5 0.0 - 2.0 %  Basophil Absolute Count 0.0 0.0 - 0.2 x 1000/?L  Immature Granulocytes 0.4 0.0 - 0.4 %  Absolute Immature Granulocyte Count 0.0 0.0 - 0.4 x 1000/?L  nRBC 0.0 0.0 - 0.0 %  Absolute nRBC 0.0 x 1000/?L  MCH 29.3 25.0 - 35.0 pg TSH (Collected: 05/11/2019  3:01 PM)  Collection Time: 05/11/19  3:01 PM Result Value Ref Range  Thyroid Stimulating Hormone 0.979 See Comment ?IU/mL Lipid panel (Collected: 05/11/2019  3:01 PM)  Collection Time: 05/11/19  3:01 PM Result Value Ref Range  Cholesterol 134 See Comment mg/dL  HDL 52 >=40 mg/dL  Triglycerides 37 See Comment mg/dL  Chol/HDL Ratio 2.6 0.0 - 5.0  LDL Calculated 75 See Comment mg/dL Discharge vitals Temp: 97.5 ?F (36.4 ?C)Pulse: 71Resp: 18BP: 112/75SpO2: 100 %Mental Status ExaminationUpon AdmissionAs per note completed by Dr. Mina Marble, MD on 05/13/2019:General AppearanceHabitus:  ThinGrooming:  Good?MusculoskeletalStrength and Tone: Strength normalGait and Station: Stable gait and stable posture?PsychiatricAttitude: Cooperative and good eye contactPsychomotor Behavior: No psychomotor activation or retardationSpeech: normal rate, volume and prosody   Patient reported mood:  iffyAffect: anxious, dysphoricThought Process: Coherent, logical, goal directedAssociations: NormalThought Content: no auditory hallucinations, no visual hallucinationsSuicidal Ideation: no current suicidal ideation, no current suicidal intent, no planHomicidal Ideation: No homicidal ideation, no planJudgment: limited.Insight: limited.?Cognitive EvaluationOrientation: Oriented to date/time, oriented to place and oriented to person Memory: Recent and remote memory intactFund of Knowledge:  LimitedAbstract Reasoning: Decreased capacity for abstract reasoningUpon Discharge:General AppearanceHabitus:  MediumGrooming:  GoodMusculoskeletalStrength and Tone: Strength normalGait and Station: Stable gait and stable posturePsychiatricAttitude: Cooperative, good eye contact and pleasantPsychomotor Behavior: No psychomotor activation or retardationSpeech: normal rate, volume and prosody   Patient reported mood:  I feel goodAffect: Congruent to reported moodThought Process: Coherent, logical, goal directedAssociations: NormalThought Content: no auditory hallucinations, no command auditory hallucinations, no paranoid delusions, not perseverativeSuicidal Ideation: No current suicidal plan, ideation or intentHomicidal Ideation: No current homicidal ideation, plan or intentJudgment:  GoodInsight:  GoodCognitive EvaluationOrientation: Oriented to person, oriented to place and oriented to date/time Attention and Concentration:  Normal attention and concentrationMemory: Recent and remote memory intactLanguage:  Language intactFund of Knowledge: average.Abstract Reasoning: Normal capacity for abstract reasoningDischarge Condition: stableDischarge Medications:  Medications Ordered Upon Discharge  TAKE these medications    Instructions Indications of Use hydrOXYzine 25 mg tabletCommonly known as: ATARAX Take 1 tablet (25 mg total) by mouth every 4 (four) hours as needed (Anxiety). anxiety lithium 450 mg CR extended release tabletCommonly known as: ESKALITH Take 1 tablet (450 mg total) by mouth 2 (two) times daily with breakfast and dinner. other bipolar disorder QUEtiapine 50 mg Immediate Release tabletCommonly known as: SEROquel Take 3 tablets (150 mg total) by mouth 2 (two) times daily. other bipolar disorder zolpidem 5 mg tabletCommonly known as: AMBIEN Take 1 tablet (5 mg total) by mouth nightly. difficulty falling asleep  Number of antipsychotic medications prescribed at discharge: oneFollow-up Information:Next level of care recommendations:  [id number] [place], [place] scheduled for Monday 05/24/19 at 2 pm via video visits.DispositionDischarge Disposition: Home care or self careElectronically SignedProvider:  Mina Marble, MD3/8/202111:45 AM NOTE: Spent more than 45 minutes to discharge the patient - reviewing medications, answering questions by the patient and providing counseling, securing aftercare arrangements and making a referral to [facility].Patient consent given for video visit:  YesVIDEO TELEHEALTH VISIT:  This clinician is part of the Telehealth program and is conducting this visit in a currently approved location.  For this visit, the clinician and patient were present via an interactive audio and video telecommunications system that permits real-time communications.  The clinician is appropriately licensed in the state of [place] to provider care for this visit.  The visit type for this patient required modifications due to the COVID-19 outbreak.The confidentiality of this record is required under Chapter [place] of the [place] as well as Title 42 of the [employer].  This material shall not be transmitted to anyone without written consent or authorization as provided in (these) statutes.

## 2019-05-24 ENCOUNTER — Inpatient Hospital Stay: Admit: 2019-05-24 | Discharge: 2019-05-24 | Payer: MEDICAID | Primary: Cardiovascular Disease

## 2019-05-24 ENCOUNTER — Encounter: Admit: 2019-05-24 | Payer: PRIVATE HEALTH INSURANCE | Attending: Clinical | Primary: Cardiovascular Disease

## 2019-05-24 ENCOUNTER — Encounter
Admit: 2019-05-24 | Payer: PRIVATE HEALTH INSURANCE | Attending: Psychiatric/Mental Health | Primary: Cardiovascular Disease

## 2019-05-24 DIAGNOSIS — F419 Anxiety disorder, unspecified: Secondary | ICD-10-CM

## 2019-05-24 DIAGNOSIS — F1210 Cannabis abuse, uncomplicated: Secondary | ICD-10-CM

## 2019-05-24 DIAGNOSIS — F32A Depression: Secondary | ICD-10-CM

## 2019-05-24 DIAGNOSIS — F411 Generalized anxiety disorder: Secondary | ICD-10-CM

## 2019-05-24 DIAGNOSIS — R5383 Other fatigue: Secondary | ICD-10-CM

## 2019-05-24 DIAGNOSIS — J020 Streptococcal pharyngitis: Secondary | ICD-10-CM

## 2019-05-24 DIAGNOSIS — F332 Major depressive disorder, recurrent severe without psychotic features: Secondary | ICD-10-CM

## 2019-05-24 DIAGNOSIS — T1491XA Suicide attempt, initial encounter: Secondary | ICD-10-CM

## 2019-05-24 NOTE — Treatment Summary
[facility] Name:  Zoe Scott Phone Number:  [phone] Date:  05/24/2019    Plan Type:  New Name and contact information of the Provider/Clinician working with/reviewing Plan with Client:  Solmon Ice, LCSW The Safety Plan is designed to support you if you have a mental health crisis or suicidal thoughts.  It offers warning signs, internal coping strategies, ideas for distraction and people who can support you during a future time of crisis.  If you have been having thoughts of suicide, you are not alone.  Many people have these at some point in their lives when the pain of living feels larger than the resources to cope with the pain.  While this pain may seem overwhelming and permanent, with time you can overcome it.  Most people do get through this, and the period of crisis eventually passes. If the patient is unable to complete the Safety Plan, please specify reason:        STEP 1 - Warning Signs How will you know that you are in crisis and that the Safety Plan should be used?  What thoughts, feelings, moods, situations, behaviors or images might be ?warning signs? that a crisis might be developing?  Be specific.     Examples:  Thoughts (I?m a failure), moods (depressed), situations (argument with spouse) or images (flashbacks). 1.  racing thoughts 2.  my appettite decreases, isolating a lot 3.  compulsive decisions - picking my face a lot STEP 2 - Internal Coping Strategies What can you do, on your own, to help you cope with the crisis?  Be specific.     Examples:  Deep breathing, exercise, prayer/meditation, watching tv 1.  exercise 2.  talking to a friend 3.    STEP 3 - People and Places for Distraction What social settings may offer healthy distraction?  What people can you spend time with to help you take your mind off your thoughts?     Examples:  Coffee shop, friend?s home, public park 1.  My good friend Shanda Bumps 2.  My good friend Alexis 3.    STEP 4 - Help from Family and Friends Among your family and friends, who could you contact for help during a crisis?  Enter their names and phone numbers (up to 3): Name:  no one at the moment Phone Number:    Name:    Phone Number:    Name:    Phone Number:    STEP 5 - Clinicians and Agencies to Call Which mental health professionals could you contact for help during a crisis?  Enter their names and phone numbers (up to 2): Name:  Louie Casa, APRN - [facility] Medication Provider Phone Number:  [phone] Name:  Yvetta Coder, LCSW - [facility] Social Worker Phone Number:  [phone] You may wish to contact the following agencies:   Suicide Prevention Hotline:   1-800-273-TALK  (608)687-6334)   [place] [facility]:  211   Emergency Services:   911   Crisis Text Line:  Text HOME to [phone] You may also go to the nearest Emergency Department STEP 6 - Making the Environment Safe What can you do to make your environment safer?      Examples:  Remove guns, lock up pills 1.  I feel safe and that I have control, but when I'm really down my grandmother can lock up the sharps so I don't inflict self harm by cutting 2.    The one thing that is most important to  me and worth living for is:My son. Copy permission granted to [facility] by Jeanella Cara, PhD on 07-31-18.
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
## 2019-05-24 NOTE — Care Coordination-Inpatient
This clinician met with patient via telehealth services, following IOP intake evaluation with Jess DiNisco, APRN?to review treatment recommendations, review and finalize treatment start date and?IOP?schedule moving forward, and to review all intake paperwork and answer any questions.???Writer reviewed recommendation to begin IOP level of care,?General Mental Health Track,?3?days a week, 3 hours per day,?Mondays,?Wednesdays?and Thursdays from 10:00?a.m. -?1:00 p.m.?via telehealth zoom group therapy. Patient stated understanding and agreement with such plan and confirmed treatment start date?will be?Wednesday,?05/26/2019.?This Clinical research associate confirmed patient's primary clinician will be?Natalie Cristino, LCSW,?and patient was provided with?clinician?s?contact information.???REACH staff reviewed the following documents with patient:- Patient information and consent for telehealth services- Patient start up instructions?for MyChart and?for REACH Program Group Therapy via Zoom- Group Rules for REACH zoom tele psychiatry- Virtual Group Etiquette- REACH IOP Guidelines and Expectations- Zoom group therapy link with Meeting ID and passcodes for her IOP treatment groups each day (electronic copy was emailed to patient via her MyChart Medical Record messaging system)- Emergency Instructions-[place] Information?reagrding Medical and Psychiatric Advanced Directives- Informational handout on Psychiatric Advanced Directives and Resources(Electronic versions of the above noted information/documents were emailed to patient through his MyChart Messaging System today.?Please see copy of e-mail message below)?Patient provided verbal consent to involve?her mother Zoe Scott [phone]), grandmother Zoe Scott [phone])?and her Primary Care Physician Zoe Felty, APRN - [phone] [facility] [place], [place])?in her?psychiatric care, to coordinate care, to obtain collateral feedback,?and/or obtain treatment and medical records if needed.?Due to COVID-19 this patient was unable to sign ROI's, therefore?she provided verbal consent, and this writer?completed?the ROI's with this information. ROI's scanned into EMR under media tab.?During this meeting?patient?developed a new?safety plan?with?support from this this Clinical research associate.?Patient?was provided access to safety plan through MyChart.?(please see safety?plan?directly below):[facility] HealthMY SAFETY PLAN Name:  Zoe Scott Phone Number:  [phone] Date:  05/24/2019    Plan Type:  New Name and contact information of the Provider/Clinician working with/reviewing Plan with Client:  Solmon Ice, LCSW The Safety Plan is designed to support you if you have a mental health crisis or suicidal thoughts.  It offers warning signs, internal coping strategies, ideas for distraction and people who can support you during a future time of crisis.  If you have been having thoughts of suicide, you are not alone.  Many people have these at some point in their lives when the pain of living feels larger than the resources to cope with the pain.  While this pain may seem overwhelming and permanent, with time you can overcome it.  Most people do get through this, and the period of crisis eventually passes. If the patient is unable to complete the Safety Plan, please specify reason:        STEP 1 - Warning Signs How will you know that you are in crisis and that the Safety Plan should be used?  What thoughts, feelings, moods, situations, behaviors or images might be ?warning signs? that a crisis might be developing?  Be specific.     Examples:  Thoughts (I?m a failure), moods (depressed), situations (argument with spouse) or images (flashbacks). 1.  racing thoughts 2.  my appettite decreases, isolating a lot 3.  compulsive decisions - picking my face a lot STEP 2 - Internal Coping Strategies What can you do, on your own, to help you cope with the crisis?  Be specific.     Examples:  Deep breathing, exercise, prayer/meditation, watching tv 1.  exercise 2.  talking to a friend 3.    STEP 3 -  People and Places for Distraction What social settings may offer healthy distraction?  What people can you spend time with to help you take your mind off your thoughts?     Examples:  Coffee shop, friend?s home, public park 1.  My good friend Zoe Scott 2.  My good friend Zoe Scott 3.    STEP 4 - Help from Family and Friends Among your family and friends, who could you contact for help during a crisis?  Enter their names and phone numbers (up to 3): Name:  no one at the moment Phone Number:    Name:    Phone Number:    Name:    Phone Number:    STEP 5 - Clinicians and Agencies to Call Which mental health professionals could you contact for help during a crisis?  Enter their names and phone numbers (up to 2): Name:  Zoe Casa, APRN - REACH IOP Medication Provider Phone Number:  [phone] Name:  Zoe Coder, LCSW - REACH Social Worker Phone Number:  [phone] You may wish to contact the following agencies:   Suicide Prevention Hotline:   1-800-273-TALK  432-620-2847)   [place] Resources:  211   Emergency Services:   911   Crisis Text Line:  Text HOME to 941-291-1175 You may also go to the nearest Emergency Department STEP 6 - Making the Environment Safe What can you do to make your environment safer?      Examples:  Remove guns, lock up pills 1.  I feel safe and that I have control, but when I'm really down my grandmother can lock up the sharps so I don't inflict self harm by cutting 2.    The one thing that is most important to me and worth living for is:My son. Copy permission granted to [facility] by Zoe Cara, PhD on 07-31-18.The following message below was sent to patient via MyChart Messaging System today 06/17/19:Hi Zoe Scott, It was nice to speak with you following your intake with Jess DiNisco, APRN this afternoon. ?Please review all of the information below, as well as the documents attached. ?As we discussed, you will be admitted into the REACH Intensive Outpatient Program, General Mental Health Treatment Track. ?Please see the information below: Start Date: Wednesday 05/26/19 at 10 AM Schedule: Mondays, Wednesdays, Thursdays from 10 AM - 1 PM Primary Clinician (social worker): Zoe Coder, LCSW Medication Provider: Louie Casa, APRN Zoom Meeting ID's and pass codes for week of 06/17/2019-June 20, 2019 **All meetings for the week will use the same ID and Passcode. ? ? ? ? ? ? ? ? ? ? ? ? Please join each meeting at 9:55 AM so that we can begin group on time.** Meeting ID: [id number] Passcode: [id number] Link: [email or web address] Attached to this message are multiple documents regarding the following topics: Patient consent Patient Telehealth Start-up instructions Group Rules ?? Please call us if you have any questions or concerns. We look forward to working with you. Sincerely, Robley Fries, LCSW Clinical Coordinator [phone] ? ? ? Solmon Ice, LCSW08-Apr-20214:34 PMAddendum:Just prior to completing this conversation, Clinical research associate confirmed patient's current residence. Patient stated she would like her permanent address: in her EMR to remain [address], [place], [place] [address] - which is patient's mother's address. Patient stated, My mother kicked me and my son out of her house last year, and we have been moving around but that is where I want I my mail to go.  Patient stated she and her son are currently residing with  her grandmother Zoe Scott at [address], [place], [place] [address], and patient stated she would like this to be noted in her EMR as her temporary address.  This Clinical research associate has updated patient's demographic tab in her EMR to reflect her request as noted.Solmon Ice, LCSW3/15/20216:31 PM

## 2019-05-25 NOTE — Other
THE [facility] HOSPITAL ADULT REACH [facility] Adult Reach [phone] [address] [address] Number: [phone] Number: [phone] Outpatient Registered Nurse Assessment3/16/2021Fall Risk Screening Have you fallen within the last 6 months?  no Do you use any assist device to ambulate/walk?  no Do you need physical assistance with standing or walking?  no Do you have periods of forgetfulness or do not know where you are at times?  no Nutrition Risk Nutrition Patient Reported Diet/Restrictions/Preferences  regular Current Appetite  good Access to Food  yes Who Prepares Meals  self Use of Supplements  -- [none] Nutrition Support  none History of Eating Disorder  none Health Screening Patient's current sleep habits / sleep patterns   7 hours sleep a night. DFA And I have a 25 year old Does the patient have a Pediatrician/Primary Care Provider  Yes (if yes, see Current Tx Providers section to add details) Was the patient's last physical exam over a year ago  No Was the patient's last medical visit more than six months ago  Yes Is the patient over age 104  No Is the patient/guardian concerned about any medical problem they are not currently being treated for  No Does the patient appear ill or to have an untreated medical condition  No Health Screening recommendations  This patient does not require a physical exam at this time Sleep Patterns Sleep Patterns  Difficulty falling off to sleep, Middle of the night awakening Sleep/Nap during the day  No Eyes/Vision Eyes/Vision  Within normal limits Ears/Hearing Ears/Hearing  Within normal limits For hearing impairment, the patient requires the following:  -- [none] Neurologic Neurologic  Within normal limits Respiratory Respiratory  Within normal limits Cardiac/Circulatory Cardiac/Circulatory  Within normal limits Gastrointestinal Gastrointestinal  Within normal limits Endocrine Endocrine  Within normal limits Genitourinary Genitourinary  Within normal limits Immune System  Within normal limits Musculoskeletal  Within normal limits Skin                                                  Within normal limits Electronically SignedProvider:  3/16/20213:26 PM Vance Gather, RN3/16/20213:27 PM

## 2019-05-26 ENCOUNTER — Inpatient Hospital Stay: Admit: 2019-05-26 | Discharge: 2019-05-26 | Payer: MEDICAID | Primary: Cardiovascular Disease

## 2019-05-26 NOTE — Telephone Encounter
Patient called ahead of program to cancel her IOP appointment 05/26/19, stating she forgot about a Birth to 3 appointment she has with her 25 year old son. Plan for patient to begin IOP treatment Thursday 05/27/19 at 10 am.Fredie Majano, LCSW3/17/202111:29 AM

## 2019-05-27 ENCOUNTER — Inpatient Hospital Stay: Admit: 2019-05-27 | Discharge: 2019-05-27 | Payer: MEDICAID | Primary: Cardiovascular Disease

## 2019-05-27 DIAGNOSIS — F411 Generalized anxiety disorder: Secondary | ICD-10-CM

## 2019-05-27 DIAGNOSIS — F332 Major depressive disorder, recurrent severe without psychotic features: Secondary | ICD-10-CM

## 2019-05-27 DIAGNOSIS — F3189 Other bipolar disorder: Secondary | ICD-10-CM

## 2019-05-27 DIAGNOSIS — F1210 Cannabis abuse, uncomplicated: Secondary | ICD-10-CM

## 2019-05-27 NOTE — Telephone Encounter
Patient arrived to 1st group late (10:14 AM), had her camera off. With in minutes of arrival to IOP patient was not visible and then disconnected from Zoom. Therefore, patient did not attend IOP treatment today as planned and scheduled. This Clinical research associate called patient at patient's identified contact phone number of [phone] twice.  Patient did not answer and patient's voicemail was full. This Clinical research associate sent an SMS notification with this writer's direct phone number of [phone]. Awaiting a call back. Payton Moder Cristino, LCSW3/18/20211:01 PMUpdatedNatalie Cristino, LCSW3/18/20215:43 PM

## 2019-05-31 ENCOUNTER — Inpatient Hospital Stay: Admit: 2019-05-31 | Discharge: 2019-05-31 | Payer: MEDICAID | Primary: Cardiovascular Disease

## 2019-05-31 NOTE — Other
Patient was a no-show no call to IOP treatment today Monday May 31, 2019 at 10:00 a.m.Marland Kitchen  Patient had attempted to begin IOP treatment on Thursday March 18th however she had technical difficulties and was an able to connect to group.  This clinical coordinator contacted patient at her listed telephone number.  Patient thanked Clinical research associate for phone call and apologized for having technology difficulty which prevented her from beginning IOP treatment on Thursday 05/27/19. Patient also stated, I have a two year old son and it's been really crazy this past week.  Patient stated she has since gotten a new phone and that she has child care every Monday, Wed, thursday and would like to ?give it a try to start on Thursday.?  This Clinical research associate offered for patient to start IOP treatment Wednesday 06/02/19 however she stated Thursday 06/03/19 would be better.  Patient stated once she begins on Thursday she is able to commit to every Monday, Wednesday, Thursday IOP treatment from 10:00 a.m. until 1:00 p.m. for a duration of approximately 6 weeks. Patient again Forensic psychologist and ended phone call.Plan for patient to be admitted to IOP and begin treatment 06/03/19 at 10 AM.Evian Salguero, LCSW3/22/20216:49 PM

## 2019-06-02 ENCOUNTER — Ambulatory Visit: Admit: 2019-06-02 | Payer: PRIVATE HEALTH INSURANCE | Primary: Cardiovascular Disease

## 2019-06-03 ENCOUNTER — Inpatient Hospital Stay: Admit: 2019-06-03 | Discharge: 2019-06-03 | Payer: MEDICAID | Primary: Cardiovascular Disease

## 2019-06-03 NOTE — Progress Notes
Patient arrived late to IOP treatment around 10:15 AM. Her face was visible on the screen and she was acknowledged while other group members were completing check -in. Patient disconnected from Zoom IOP within minutes of joining group. [name], PCT called patient while group was in session. Janyth Pupa reported to this Clinical research associate that patient stated she was not sure she wanted to attend IOP but she agreed to log back on for 11:00 AM. This Clinical research associate spoke with patient during break. Patient stated she did not think she wanted to attend IOP treatment but she stated she was willing to attend 2nd and 3rd group today to have a better idea of what groups are like and to make a final decision as to whether or not she wants to attend. Patient was asked to remain logged on to Zoom for 2nd group. Patient appeared to log off. She never arrived to 2nd group. This Clinical research associate called patient at 12:30 PM at her identified contact phone number of [phone]. Patient did not answer and a voicemail message could not be left but an SMS message could be sent. This Clinical research associate sent [phone] as an SMS message. This Clinical research associate did not receive a call back. This Clinical research associate called patient again at 2:45 PM. Again patient did not answer and patient was sent [phone] as an SMS message. Clinical Coordinator Solmon Ice, LCSW and REACH LIP Jess Dinisco,APRN informed. Jamil Armwood Cristino, LCSW3/25/20212:58 PM

## 2019-06-04 NOTE — Other
The following letter has been mailed to patient's listed home address:March 25, 2021[address]aya [address] [place], [place] [address], [place] are sorry to send you a letter in the mail, however we tried to contact you directly via telephone multiple times, and our attempts have been unsuccessful. Following your Intensive Outpatient Program (IOP) intake with Jess DiNisco, APRN on May 24, 2019 you have missed several scheduled IOP treatment days, and we have been unable to connect with you. We are assuming you are no longer interested in IOP services at [facility], therefore you are being discharged from starting program at this time.If you would like to return for IOP services, we will welcome you back for a re-evaluation for admission into the program. If this is your desire, please call the number noted below.Please do not hesitate to contact the REACH program at [phone], with any questions or concerns, or to schedule an evaluation for re-admission into our IOP.  Suicide Prevention Information If you experience thoughts of suicide, please use one of the following resources for help:1.  Call your outpatient psychiatric provider(s), for example, psychiatrist, therapist and/or in-home service.2.  Call 211 and request suicide prevention and crisis intervention.3.  Call the national Prevention Lifeline at (919)789-7195 Len Childs).4.  Text the Crisis Text Line by texting the word HOME to 9629528.  Go to your local hospital Emergency Department. For Additional Questions Please follow up with your primary care physician and/or other specialty physician regarding non-psychiatric medications.We wish you well.Sincerely, Solmon Ice, LCSWClinical CoordinatorPhone: [phone] Jacquese Cassarino, LCSW3/26/20211:50 PM

## 2019-06-07 ENCOUNTER — Ambulatory Visit: Admit: 2019-06-07 | Payer: PRIVATE HEALTH INSURANCE | Primary: Cardiovascular Disease

## 2019-06-09 ENCOUNTER — Ambulatory Visit: Admit: 2019-06-09 | Payer: PRIVATE HEALTH INSURANCE | Primary: Cardiovascular Disease

## 2019-06-10 ENCOUNTER — Ambulatory Visit: Admit: 2019-06-10 | Payer: PRIVATE HEALTH INSURANCE | Primary: Cardiovascular Disease

## 2019-06-10 DIAGNOSIS — F1210 Cannabis abuse, uncomplicated: Secondary | ICD-10-CM

## 2019-06-14 ENCOUNTER — Telehealth: Admit: 2019-06-14 | Payer: PRIVATE HEALTH INSURANCE | Primary: Cardiovascular Disease

## 2019-06-14 ENCOUNTER — Ambulatory Visit: Admit: 2019-06-14 | Payer: PRIVATE HEALTH INSURANCE | Primary: Cardiovascular Disease

## 2019-06-14 ENCOUNTER — Encounter: Admit: 2019-06-14 | Payer: PRIVATE HEALTH INSURANCE | Primary: Cardiovascular Disease

## 2019-06-14 DIAGNOSIS — F1210 Cannabis abuse, uncomplicated: Secondary | ICD-10-CM

## 2019-06-14 DIAGNOSIS — F332 Major depressive disorder, recurrent severe without psychotic features: Secondary | ICD-10-CM

## 2019-06-14 DIAGNOSIS — R5383 Other fatigue: Secondary | ICD-10-CM

## 2019-06-14 NOTE — Progress Notes
Pt called reach to speak with Shanda Bumps Dinisco APRN regarding medication. Pt was transferred to Clinical research associate by front desk. Pt reported she  Felt depressed with no motivation for past week and a half. Pt denied any feelings of suicide or wanting to harm herself. Writer reviewed chart and consulted with Solmon Ice LCSW( coordinator for the adult program.) Writer called Pt at 320pm and informed pt that she had attended 1 group on 06/03/2019 and left  A few minutes after group started. Staff at reach had made several attempts to contact pt by phone and left messages and pt did not respond. Pt reported  I need individual therapy ,I do not want to tell my problems to a group. Writer informed pt if she feels suicidal to contacy 211/911 or go to nearest  ED. Writer informed pt that Reach can not prescribe  Medications for pt because she has not shown up for IOP  Since that 1st group. Pt was given the option to call Victorino Dike Naughton LCSW to  Schedule another intake for  Reach but that would mean attending groups because reach does not provide individual therapy and medication management . Pt reported she had 5 days left of medication and was taking them as prescribed but will run out in 5 days. Writer informed pt she could ask her PCP to prescribe her medications and/or speak withJennifer Naughton  LCSW to schedule another intake appt. Pt  Verbally acknowledged understanding and asked to be transferred to [facility] Naughton's VM. Writer transferred phone call at 330pm. Vance Gather, RN4/5/20213:48 PM

## 2019-06-16 ENCOUNTER — Encounter: Admit: 2019-06-16 | Payer: PRIVATE HEALTH INSURANCE | Attending: Clinical | Primary: Cardiovascular Disease

## 2019-06-16 ENCOUNTER — Ambulatory Visit: Admit: 2019-06-16 | Payer: PRIVATE HEALTH INSURANCE | Primary: Cardiovascular Disease

## 2019-06-16 DIAGNOSIS — F1210 Cannabis abuse, uncomplicated: Secondary | ICD-10-CM

## 2019-06-16 DIAGNOSIS — F332 Major depressive disorder, recurrent severe without psychotic features: Secondary | ICD-10-CM

## 2019-06-16 DIAGNOSIS — F411 Generalized anxiety disorder: Secondary | ICD-10-CM

## 2019-06-16 DIAGNOSIS — R5383 Other fatigue: Secondary | ICD-10-CM

## 2019-06-16 DIAGNOSIS — F3189 Other bipolar disorder: Secondary | ICD-10-CM

## 2019-06-17 ENCOUNTER — Ambulatory Visit: Admit: 2019-06-17 | Payer: PRIVATE HEALTH INSURANCE | Primary: Cardiovascular Disease

## 2019-06-17 ENCOUNTER — Encounter: Admit: 2019-06-17 | Payer: PRIVATE HEALTH INSURANCE | Attending: Clinical | Primary: Cardiovascular Disease

## 2019-06-17 ENCOUNTER — Telehealth
Admit: 2019-06-17 | Payer: PRIVATE HEALTH INSURANCE | Attending: Psychiatric/Mental Health | Primary: Cardiovascular Disease

## 2019-06-17 ENCOUNTER — Encounter: Admit: 2019-06-17 | Payer: PRIVATE HEALTH INSURANCE | Attending: Psychiatry | Primary: Cardiovascular Disease

## 2019-06-17 DIAGNOSIS — F3189 Other bipolar disorder: Secondary | ICD-10-CM

## 2019-06-17 DIAGNOSIS — F411 Generalized anxiety disorder: Secondary | ICD-10-CM

## 2019-06-17 DIAGNOSIS — R5383 Other fatigue: Secondary | ICD-10-CM

## 2019-06-17 DIAGNOSIS — F332 Major depressive disorder, recurrent severe without psychotic features: Secondary | ICD-10-CM

## 2019-06-17 MED ORDER — QUETIAPINE IMMEDIATE RELEASE 50 MG TABLET
50 mg | ORAL_TABLET | Freq: Two times a day (BID) | ORAL | 1 refills | Status: AC
Start: 2019-06-17 — End: 2019-06-18

## 2019-06-17 MED ORDER — LITHIUM CARBONATE ER 450 MG TABLET,EXTENDED RELEASE
450 mg | ORAL_TABLET | Freq: Two times a day (BID) | ORAL | 1 refills | Status: AC
Start: 2019-06-17 — End: 2019-06-18

## 2019-06-17 MED ORDER — ZOLPIDEM 5 MG TABLET
5 mg | ORAL_TABLET | Freq: Every evening | ORAL | 1 refills | Status: AC
Start: 2019-06-17 — End: 2019-06-18

## 2019-06-17 MED ORDER — HYDROXYZINE HCL 25 MG TABLET
25 mg | ORAL_TABLET | ORAL | 1 refills | Status: AC | PRN
Start: 2019-06-17 — End: 2019-06-18

## 2019-06-17 NOTE — Telephone Encounter
Patient contacted the REACH Office today 06/17/19 as a follow up from phone call yesterday evening. Patient reported she followed up with [facility] and thay are not accepting new patients right now for 1:1 treatment. Patient also stated she contacted the [facility] inpatient psychiatric unit and left a message asking if they could refill her medication, but I didn't get a call back.Patient stated she is currently standing in line at [facility] because maybe Dr. Ashley Jacobs from the hospital called in a refill for me. I'm scared because don't have any medication left and I'm starting to feel sick. Patient stated she is not currently feeling suicidal or homicidal, but sick. Patient unable to elaborate, maybe because she is inline in a public area. This Clinical research associate offered to provide patient with an IOP appointment to start IOP treatment at [facility] on Monday 06/21/19, at which time she can engage in group therapy and see a provider. Patient stated, yes please. And please send me the zoom passcodes so I can get into the meeting. This Clinical research associate reviewed safety with patient, including going to the nearest ED or calling 911 if she continues to feel sick or is feeling unsafe in any way, including having any thoughts to hurt or harm herself or anyone else. Patient stated understanding and verbalized agreement to go to the ED if she continues to feel sick and is unable to get her medication.Plan for patient to begin IOP treatment at [facility] on Monday 06/21/19.Phone call ended at this time.This Clinical research associate informed REACH provider Louie Casa, APRN of the above noted information.F/U as needed.Carlyne Keehan, LCSW4/8/20217:07 PM

## 2019-06-17 NOTE — Telephone Encounter
Tele-visit Information You hereby consent to receiving [facility] services via telehealth technologies. You understand that [facility] and its consulting providers offer telehealth services, but that these services do not replace the relationship between you and your primary care doctor. You understand that primary responsibility for your medical care should remain with your local primary care doctor, if you have one. You also understand it is up to me to determine whether or not your needs are appropriate for a telehealth encounter. Do you agree to this consent [or consent on behalf of your minor child]? yesDo I continue to have your consent to disclose records concerning this telehealth interaction to your PCP and/or other providers? yesDo you have any questions before we proceed? yesTelehealth Informed Consent ProvidedThis provider is part of the telehealth program and is conducting this visit at a site within our enrolled clinical location. For this visit the clinician and patient were present via interactive audio & video telecommunications system that permits real-time communications.Family was also present during the telemedicine encounter. [facility] H[facility] ADULT RE[facility] [phone] [address] [place] [address] Number: [phone] Number: [phone] Outpatient Psychiatry MD/LIP Progress Note4/8/2021Start Time:  07:10      End Time:  07:28Session Type:  Medication ManagementAmanda Scott is a 24 y.o., Single, female.SUBJECTIVE Chief Complaint: I ran out of my meds two days ago. Interim History:  24 year old female with past psychiatric history of major depressive disorder, history of suicide attempts, and anxiety disorder referred to REACH adult IOP by [facility] following an inpatient admission for worsening depression and anxiety symptoms. Zoe Scott's REACH intake was on 05/24/19 and she finished a 1/2 of a group and then left the program. Zoe Scott called REACH yesterday stating she had run out of medication. Today she reports feeling sick and very nauseated an lethargic. Her medication list is listed below. She currently denies any suicidal thoughts. She reports sleeping at least six hours per night. She reports that she experiences anger and some days she feels super agitated and gets angry. She reports no drug use except cannabis and denies any other drug or alcohol use. ??Plan: Re-start med and provide Zoe Scott with a week's supply Re-admit to IOPContinue lithium 450mg  twice a dayContinue seroquel 150mg  twice a day Continue hydroxyzine 25mg  every four hours as needed for anxiety Continue zolpidem 5mg  every night ?Draw litihum level Last lithium level on 05/17/19: 0.92??REVIEW OF ALLERGIES/MEDICATIONS/HISTORY I have reviewed the patient's current medications and allergiesI have reviewed with the patient, the risks and benefits of the medications prescribed.OBJECTIVE Review of SystemsDepressedLabsNo new labsCurrent MedicationsCurrent Outpatient Medications Medication Sig ? hydrOXYzine (ATARAX) 25 mg tablet Take 1 tablet (25 mg total) by mouth every 4 (four) hours as needed (Anxiety). ? lithium (ESKALITH) 450 mg CR extended release tablet Take 1 tablet (450 mg total) by mouth 2 (two) times daily with breakfast and dinner. ? QUEtiapine (SEROQUEL) 50 mg Immediate Release tablet Take 3 tablets (150 mg total) by mouth 2 (two) times daily. ? zolpidem (AMBIEN) 5 mg tablet Take 1 tablet (5 mg total) by mouth nightly. No current facility-administered medications for this visit.  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  Additionally, risk assessment from [facility] intake assessment and follow up screenings have been reviewed.  Based on Zoe Scott?s clinical presentation today as well as her self-reported symptom ratings today and over the current course of treatment, I have assessed Zoe Scott to be at unchanged risk of harm to self as compared with prior recent assessments.  Based on this assessment, I have continued current treatment plan including implementation of factors to mitigate risks of harm to self as outlined in the last risk assessmentIntervention(s)Supportive therapy/ psycho-educationLabs/Test/Consults OrderedDraw lithium level next week after re-starting lithium Plan--hydrOXYzine (ATARAX) 25 mg tablet Take 1 tablet (25 mg total) by mouth every 4 (four) hours as needed (Anxiety).-- Lithium (ESKALITH) 450 mg CR extended release tablet Take 1 tablet (450 mg total) by mouth 2 (two) times daily with breakfast and dinner.-- QUEtiapine (SEROQUEL) 50 mg Immediate Release tablet Take 3 tablets (150 mg total) by mouth 2 (two) times daily.-- zolpidem (AMBIEN) 5 mg tablet Take 1 tablet (5 mg total) by mouth nightly.Discussed side effects, pros & cons of treatment, and alternative treatment options. Reviewed safety plan to call the clinic, the National Suicide Prevention Lifeline: 347-525-7553, or  911 to go to the ER if safety is at risk. Not deemed at imminent risk of harm to self/others at this timePatient verbalized understanding and had no additional questions. Electronically Signed:Darrel Baroni, NP 06/17/2019 7:29 PM

## 2019-06-17 NOTE — Telephone Encounter
-----   Message from Solmon Ice, [place] sent at 06/17/2019  6:59 PM EDT -----Regarding: Patient rom 3/15 intakeHi Jess,This is the patient we were just discussing. Her phone number is [phone].We are going to put her in to start IOP in the General Mental Health Track on MOnday 4/12.Please let us know if you would like to follow up on anything for her.Atilano Ina

## 2019-06-17 NOTE — Telephone Encounter
Patient contacted the Reach program office and left message stating she would like a refill on her medications.  This Clinical research associate returned patient's phone call, calling [phone].  There was no answer therefore writer left message requesting call back.Patient called this writer back stating she needs a refill on her medications.  This clinical coordinator reminded patient that she completed her intensive Outpatient program evaluation on March 15th and was scheduled for admission into treatment however patient did not follow through with her IOP treatment.  This Clinical research associate reminded patient that multiple phone calls were made to her however she did not return the calls.  Patient stated, ?Well groups are not for me.  I am not going to be telling my personal business to people.  I just need you to give me my meds!?  This Clinical research associate explained to patient that if she would like [place] to prescribe medication she needs to engage in the intensive outpatient program.  Writer made patient aware that we are happy to accept her back into program if she wishes to engage in treatment, offering patient to begin treatment either tomorrow April 8th or Monday April 12th.  Patient stated, ?no.  I just need you to give me my meds!  I am not going to groups. This is crazy!  I just want 1 on 1.?This Clinical research associate reviewed with patient that the Reach program does not offer individual treatment however there are other individual treatment providers in the community she can call and schedule an intake appointment with.  Patient continued to raise her voice, cursed at this Clinical research associate and stated, ?I am off my meds!  I am done with this [place] bullshit.! It was at this time that patient abruptly disconnected phone call.This writer waited 5 minutes for allow patient some time and then called patient back. Patient was calm and writer offered to discuss patient's options with her regarding treatment, to which patient accepted. This Clinical research associate discussed patient's treatment options:1. Re-engage in REACH IOP, group therapy and medication management x3 days weekly, 3 hours each day for a duration of approximately 6 weeks during which time the team will work on connecting patient to community outpatient providers who will provide patient with 1:1 services.2. Contact a come community providers who offer 1:1 to schedule and intake appointment, including [facility].3. Call her [employer] and obtain information on other community providers who can offer 1:1 medication management and therapy.Patient stated, OK. But Jake at [facility] was the last person to prescribe my medications. Patient stated she was going to call the [facility] inpatient psych unit to ask if they can provide a refill of her medications while she finds a 1:1 provider in the community. Patient also stated she is going to call [facility] as they are open until 8 PM this evening to see if she can get an intake appointment for 1:1 outpatient psychiatric services. This writer advised patient to call back tomorrow 06/17/19 if she would like to re-engage in the REACH IOP treatment for group therapy and medication management. Writer also reviewed safety plan, including calling 911 or going to the nearest Emergency Department if she is experience and Suicidal ideations, homicidal ideations or feeling unsafe in any way. Patient stated understanding thanked Clinical research associate and ended phone call.F/U as needed.Solmon Ice, LCSW4/7/20217:59 PM

## 2019-06-18 ENCOUNTER — Encounter: Admit: 2019-06-18 | Payer: PRIVATE HEALTH INSURANCE | Attending: Psychiatry | Primary: Cardiovascular Disease

## 2019-06-18 DIAGNOSIS — F3189 Other bipolar disorder: Secondary | ICD-10-CM

## 2019-06-18 MED ORDER — LITHIUM CARBONATE ER 450 MG TABLET,EXTENDED RELEASE
450 mg | ORAL_TABLET | Freq: Two times a day (BID) | ORAL | 2 refills | Status: AC
Start: 2019-06-18 — End: 2019-06-21

## 2019-06-18 MED ORDER — ZOLPIDEM 5 MG TABLET
5 mg | ORAL_TABLET | Freq: Every evening | ORAL | 2 refills | Status: AC
Start: 2019-06-18 — End: 2019-06-21

## 2019-06-18 MED ORDER — QUETIAPINE IMMEDIATE RELEASE 50 MG TABLET
50 mg | ORAL_TABLET | Freq: Two times a day (BID) | ORAL | 2 refills | Status: AC
Start: 2019-06-18 — End: 2019-06-21

## 2019-06-18 MED ORDER — HYDROXYZINE HCL 25 MG TABLET
25 mg | ORAL_TABLET | ORAL | 2 refills | Status: AC | PRN
Start: 2019-06-18 — End: 2019-06-21

## 2019-06-21 ENCOUNTER — Inpatient Hospital Stay: Admit: 2019-06-21 | Discharge: 2019-06-21 | Payer: MEDICAID | Primary: Cardiovascular Disease

## 2019-06-21 ENCOUNTER — Encounter: Admit: 2019-06-21 | Payer: PRIVATE HEALTH INSURANCE | Attending: Clinical | Primary: Cardiovascular Disease

## 2019-06-21 ENCOUNTER — Ambulatory Visit: Admit: 2019-06-21 | Payer: PRIVATE HEALTH INSURANCE | Primary: Cardiovascular Disease

## 2019-06-21 DIAGNOSIS — R251 Tremor, unspecified: Secondary | ICD-10-CM

## 2019-06-21 DIAGNOSIS — F332 Major depressive disorder, recurrent severe without psychotic features: Secondary | ICD-10-CM

## 2019-06-21 DIAGNOSIS — F1210 Cannabis abuse, uncomplicated: Secondary | ICD-10-CM

## 2019-06-21 DIAGNOSIS — R5383 Other fatigue: Secondary | ICD-10-CM

## 2019-06-21 MED ORDER — ZOLPIDEM 5 MG TABLET
5 mg | ORAL_TABLET | Freq: Every evening | ORAL | 1 refills | Status: AC
Start: 2019-06-21 — End: 2019-06-28

## 2019-06-21 MED ORDER — QUETIAPINE IMMEDIATE RELEASE 50 MG TABLET
50 mg | ORAL_TABLET | Freq: Two times a day (BID) | ORAL | 1 refills | Status: AC
Start: 2019-06-21 — End: 2019-07-07

## 2019-06-21 MED ORDER — LITHIUM CARBONATE ER 450 MG TABLET,EXTENDED RELEASE
450 mg | ORAL_TABLET | Freq: Two times a day (BID) | ORAL | 1 refills | Status: AC
Start: 2019-06-21 — End: ?

## 2019-06-21 MED ORDER — ZOLPIDEM 5 MG TABLET
5 mg | ORAL_TABLET | Freq: Every evening | ORAL | 1 refills | Status: AC
Start: 2019-06-21 — End: 2019-06-21

## 2019-06-21 MED ORDER — HYDROXYZINE HCL 25 MG TABLET
25 mg | ORAL_TABLET | ORAL | 1 refills | Status: AC | PRN
Start: 2019-06-21 — End: ?

## 2019-06-21 NOTE — Group Note
Group Session:  Group TherapyGroup Start Time:   11:10 AM      End Time:  12:00 PMFacilitators:  Cristino, Dorene Grebe, LCSW; [place], Janyth Pupa, PCT VIDEO TELEHEALTH VISIT, PLATFORM OTHER THAN MYCHART: This clinician is conducting this visit at a site within our enrolled clinical location..  For this visit the clinician and patient were present via interactive audio & video telecommunications system that permits real-time communications, via a platform other than MyChart.Platforms other than MyChart may be used only during the COVID-19 crisis, when MyChart is not an option, and video is required for clinical reasons. Reason for choice of video platform other than MyChart: Currently [facility] has identified Zoom as the appropriate telehealth platform to facilitate IOP Group Therapy.PLEASE NOTE: CONSENT FOR THIS VISIT IS DIFFERENT THAN FOR MYCHART VIDEO VISITS! Verbal consent must be obtained by the clinician or staff prior to starting visit.  Read the Following Statements to Video Visit Participants:  ?(Name of clinician) will be conducting your video visit today. Before we begin, we must obtain your consent for today's visit.Can you confirm your name, date of birth, and state where you (or your child, if applicable) are currently located?By consenting, you understand that today's visit will be conducted through video conferencing technology other than our usual secure service. While we do everything possible to ensure your privacy, we may not be able to guarantee the security of this particular platform. You understand that the visit will not be recorded, and that we will be documenting this visit in our electronic medical record, and that we may disclose records concerning this interaction to other clinicians. You understand that a video visit may limit our ability to diagnose your condition.  You understand that if you are experiencing a medical emergency, you will be directed to dial 9-1-1 as we are not able to connect you directly. You also understand if your condition changes after this visit and you need of further care, you will contact our office. Do you agree to this consent, knowing that you can change your decision at any time? Do you have any other questions before we proceed??Patient consent given for video visit: yes State patient is located in: [facility] clinician is appropriately licensed in the above state to provide care for this visit.Other individuals present during the telehealth encounter and their role/relation: Identified group facilitator(s), support staff as needed, and fellow group participants.Session Focus Connection between feelings, thoughts and behaviorsDevelop skills to manage issues and improve functioningStatus of symptoms / issuesTreatment goal and skill useDaily goal setting Number of Participants 12 Treatment Modality Group Therapy Interventions/Education Assisted patient addressing stressful life situationsAssisted patient with discharge planningAssisted patient with strategies for addressing peer issuesAssisted patient with strategies for managing illness/symptomsEncouraged increased cooperation with othersEncouraged increased initiation in groupsEncouraged patient to increase treatment focusAssisted patient with strategies for addressing family issuesEncouraged acceptance, tolerance and respect for othersEncouraged patient to request as-needed help solving problemsHelped decrease isolation/increase sense of belongingHelped identify feelings, thoughts and behaviors connectionHelped increase awareness of feelings, thoughts and behaviorsHelped patient acquire skills to build self-confidenceHelped patient identify strengthsHelped patient increase ability to gain support from othersHelped patient increase comfort with othersHelped patient increase interpersonal effectivenessHelped patient increase sense of acceptance by othersProvided assistance problem-solving challengesProvided reinforcement for use of adaptive coping skills Attendance            Partial attendance due to MD/APRN Meeting.  Total number of minutes attended:  25Engagement InterventionsNot applicable - patient attended group without difficulty Degree of Participation Minimal Behavior Alert  and Cooperative Speech/Thought Process Coherent and Directed Affect/Mood Anxious and Depressed Insight Fair Judgment Fair Response to Interventions Attentive and Engaged Individualization Group # 2Today in group, discussion of relaxation strategies was continued from last week. Today is patient's 1st IOP treatment day. Other group members provided information about breathing exercises practiced last week.  Group members were presented with educational materials on imagery as a relaxation strategy. Patient was attentive as other group member read.  Group members were invited to participate in guided imagery of an ocean retreat. Patient appeared engaged in the exercise. Patient was taken out of group during imagery exercise so that she could meet with nurse practitioner.   Plan Continue to assist patient with skills to improve functionContinue to assist patient with skills to manage symptomsContinue to reinforce use of positive coping skillsContinue to review and assess symptoms / issuesContinue to review and assess treatment goals / skill useContinue per Plan of Care / Interdisciplinary Plan of Care Worthy Boschert Cristino, LCSW4/12/20215:44 PM

## 2019-06-21 NOTE — Progress Notes
VIDEO TELEHEALTH VISIT, PLATFORM OTHER THAN MYCHART: This clinician is conducting this visit at a site within our enrolled clinical location..  For this visit the clinician and patient were present via interactive audio & video telecommunications system that permits real-time communications, via a platform other than MyChart.Platforms other than MyChart may be used only during the COVID-19 crisis, when MyChart is not an option, and video is required for clinical reasons. Reason for choice of video platform other than MyChart: Patient unable to access MyChart Video secondary to currently being in [employer] via [employer].PLEASE NOTE: CONSENT FOR THIS VISIT IS DIFFERENT THAN FOR MYCHART VIDEO VISITS! Verbal consent must be obtained by the clinician or staff prior to starting visit.  Read the Following Statements to Video Visit Participants:  ?(Name of clinician) will be conducting your video visit today. Before we begin, we must obtain your consent for today's visit.Can you confirm your name, date of birth, and state where you (or your child, if applicable) are currently located?By consenting, you understand that today's visit will be conducted through video conferencing technology other than our usual secure service. While we do everything possible to ensure your privacy, we may not be able to guarantee the security of this particular platform. You understand that the visit will not be recorded, and that we will be documenting this visit in our electronic medical record, and that we may disclose records concerning this interaction to other clinicians. You understand that a video visit may limit our ability to diagnose your condition.  You understand that if you are experiencing a medical emergency, you will be directed to dial 9-1-1 as we are not able to connect you directly. You also understand if your condition changes after this visit and you need of further care, you will contact our office. Do you agree to this consent, knowing that you can change your decision at any time? Do you have any other questions before we proceed??Patient consent given for video visit: yesState patient is located in: [place]The clinician is appropriately licensed in the above state to provide care for this visit.Other individuals present during the telehealth encounter and their role/relation: noneTotal time spent in medical video consultation (required if billing based on time): 25 minutes  Start Time: 11:30  End Time: 11:55[facility] AD[facility] Adult Reach [phone] [address]Bridge[address] [place] [address] Number: [phone] Number: [phone] Outpatient Psychiatry MD/LIP Progress Note4/12/2021Start Time:  11:30      End Time:  11:55Session Type:  Medication ManagementAmanda Dysart is a 24 y.o., Single, female.SUBJECTIVE Chief Complaint: I've been more depressed in the last two weeks. Interim History:  24 year old female with past psychiatric history of bipolar disrder (manic and depressive) and a history of suicide attempts and anxiety disorder referred to REACH adult IOP by [facility] following an inpatient admission for worsening depression and anxiety symptoms at the beginning of March 2021. However, she finished a 1/2 of a group and then left the program. On Thursday, 06/17/2019, [name] called REACH requesting a refill on her medication.  She reported feeling sick and very nauseated and lethargic. She was given a refill and plan to start IOP today, 06/21/19. Caitrin reports feeling more depressed over the last couple of weeks. She reports lower motivation and energy. She reports enjoying activities. She reports no suicidal thoughts or self-harming behaviors. She reports sleeping approximately 6-7 hours per night. She reports living with her grandmother and son, she is the primary caregiver for her son.  Krystianna reports, I have been angry. I start throwing my phone or something like ripping off the shade lamp. She reports no decreased need for sleep, racing thoughts, elevated sense of self. She repots anxiety is a lot manageable. I suffered from extreme high anxiety before. she reports patches of her hair removed from her head due to stress and denies any hair picking. ?She reports using cannabis one time at night and denies any other drug use. ???Plan: Continue IOP Continue lithium 450mg  twice a dayContinue seroquel 150mg  twice a day Continue hydroxyzine 25mg  every four hours as needed for anxiety Continue zolpidem 5mg  every night ?Draw litihum level Last lithium level on 05/17/19: 0.92REVIEW OF ALLERGIES/MEDICATIONS/HISTORY I have reviewed the patient's current medications and allergiesI have reviewed with the patient, the risks and benefits of the medications prescribed.OBJECTIVE Review of SystemsReview of Systems Psychiatric/Behavioral: Positive for dysphoric mood. All other systems reviewed and are negative. LabsNo new labsCurrent MedicationsCurrent Outpatient Medications Medication Sig ? hydrOXYzine (ATARAX) 25 mg tablet Take 1 tablet (25 mg total) by mouth every 4 (four) hours as needed (Anxiety). ? lithium (ESKALITH) 450 mg CR extended release tablet Take 1 tablet (450 mg total) by mouth 2 (two) times daily with breakfast and dinner. ? QUEtiapine (SEROQUEL) 50 mg Immediate Release tablet Take 3 tablets (150 mg total) by mouth 2 (two) times daily. ? zolpidem (AMBIEN) 5 mg tablet Take 1 tablet (5 mg total) by mouth nightly. No current facility-administered medications for this encounter.  Mental Status ExamGeneral AppearanceHabitus:  MediumGrooming:  GoodMusculoskeletalStrength and Tone: Strength normalGait and Station: Stable gait and stable posturePsychiatricAttitude: Cooperative, good eye contact and pleasantPsychomotor Behavior: No psychomotor activation or retardationSpeech: normal rate, volume and prosodyMood: sad   Patient reported mood:  I feel goodAffect: Congruent to reported moodThought Process: Coherent, logical, goal directedAssociations: NormalThought Content: Normal no auditory hallucinations, no command auditory hallucinations, no paranoid delusions, not perseverativeSuicidal Ideation: No current suicidal plan, ideation or intentHomicidal Ideation: No current homicidal ideation, plan or intentJudgment:  GoodInsight:  GoodCognitive EvaluationOrientation: Oriented to person, oriented to place and oriented to date/time Attention and Concentration:  Normal attention and concentrationMemory: Recent and remote memory intactLanguage:  Language intactFund of Knowledge: average.Abstract Reasoning: Normal capacity for abstract reasoningSafety and Risk AssessmentPSY RISK ASSESSMENT SAFE-T WITH C-SSRS C-SSRS: Suicidal Ideation:  Since Last Assessment  WISH TO BE DEAD:  No  CURRENT SUICIDAL THOUGHTS:  No  Have you ever done anything, started to do anything or prepared to do anything to end your life?:  Yes  Was it within the past 3 months?:  NoRisk Assessment:   Access to Lethal Methods:  NoCurrent and Past Psychiatric Diagnoses:    Mood Disorder:  Recurrent/Current  Psychotic Disorder:  No  Alcohol/Substance Abuse Disorders:  Recurrent/Current  PTSD:  No  ADHD:  No  TBI:  No  Cluster B Personality Disorders or Traits:  No  Conduct Problems:  No  Suicide Attempt:  No Prior Attempts  Presenting Symptoms:  Anhedonia:  Yes  Impulsivity:  No  Hopelessness or Despair:  Yes  Anxiety and/or Panic:  No  Insomnia:  No  Command Hallucinations:  No  Psychosis:  No  Self-injurious Behavior:  No  Physical Aggression Toward Others:  No  Violence, Victimization and/or Perpetration:  No  Family History:   Suicide:  No  Suicidal Behavior:  No  Axis I Psychiatric Diagnosis requiring hospitalization:  No  Precipitants / Stressors:   Triggering events leading to humiliation, shame and/or despair:  No  Chronic physical pain  or other acute medical problem:  No  Sexual / Physical Abuse:  No  Substance Intoxication or Withdrawal:  No  Pending Incarceration:  No  Legal Problems:  No  Inadequate Social Supports:  No  Social Isolation:  No  Perceived burden on others:  No  Bullying / Cyberbullying:  No  Media portrayal of suicide:  No  Housing Insecurity:  No  Financial Insecurity:  No  Stressful Life Events:  Yes  Gender / Sexual Identity:  No  Homelessness:  No  Change in Treatment:    Recent inpatient discharge:  No  Change in provider or treatment:  No  Hopeless or dissatisfied with provider or treatment:  No  Non-compliant:  Yes  Not receiving treatment:  NoCurrent and Past Psychiatric Diagnoses:  Mood Disorder:  Recurrent/Current  Psychotic Disorder:  No  Alcohol/Substance Abuse Disorders:  Recurrent/Current  PTSD:  No  ADHD:  No  TBI:  No  Cluster B Personality Disorders or Traits:  No  Conduct Problems:  No  Suicide Attempt:  No Prior AttemptsProtective Factors:   Internal:   Ability to cope with stress:  Yes  Frustration tolerance:  Yes  Fear of death or the actual act of killing self:  Yes  Identifies reasons for living:  Yes  Problem solving skills:  Yes  External:   Responsibility to children:  Yes  Beloved pets:  Yes  Supportive social network of friends or family:  Yes  Positive therapeutic relationships / Effective mental healthcare:  YesRisk to Self - Self-Injurious Behavior:   Current Urges to harm Self:  Yes  Recent Self-Injury:  No  History of Self Injury:  No  Imminent Risk for Self Injury in Community:  Low  Imminent Risk for Self Injury in Facility:  LowRisk to Others:   Current Agitation:  No  Homicidal/Aggressive Ideation:  No  Homicidal/Aggressive Threat/Plan:  No  Recent Violence/Aggression:  NoWithdrawal Risk:   Alcohol / Benzodiazepines / Barbiturates:  Not applicable     Alcohol/Benzodiazepine/Barbiturate Risk for Withdrawal:  Absent  Opioids:  Not applicable     Opioid Risk for Withdrawal:  AbsentMEDICAL DECISION MAKING DiagnosisProblem List         ICD-10-CM   OP/IOP/PHP Psychiatry  MDD (major depressive disorder), recurrent severe, 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 Aneta reports feeling more depressed over the last couple of weeks. Will plan to repeat lithium level and determine treatment plan following completion of lithium level. Will continue with IOP at this time. Currently, no imminent safety concerns. The Self-Report [place] Risk Suicide Severity Rating Scale Since Last Visit was completed by Voncille Lo and reviewed by myself today.  Additionally, risk assessment from [facility] intake assessment and follow up screenings have been reviewed.  Based on [facility]?s clinical presentation today as well as her self-reported symptom ratings today and over the current course of treatment, I have assessed Terra Aveni to be at unchanged risk of harm to self as compared with prior recent assessments.  Based on this assessment, I have continued current treatment plan including implementation  of factors to mitigate risks of harm to self as outlined in the last risk assessmentIntervention(s)Supportive therapy Labs/Test/Consults OrderedLithium Medication ChangesNone PlanContinue lithium 450mg  twice a dayContinue seroquel 150mg  twice a day Continue hydroxyzine 25mg  every four hours as needed for anxiety Continue zolpidem 5mg  every night Continue IOP Discussed side effects, pros & cons of treatment, and alternative treatment options. Reviewed safety plan to call the clinic, the National Suicide Prevention Lifeline: 617-815-6857, or  911 to go to the ER if safety is at risk. Not deemed at imminent risk of harm to self/others at this timePatient verbalized understanding and had no additional questions. Electronically Signed:Adaiah Morken, NP 06/21/2019 11:56 AM

## 2019-06-21 NOTE — Care Coordination-Inpatient
The following message was sent to patient today 2019/07/16 at 7:45 AM via MyChart Messaging system in patient's Electronic MEDICAL RECORD NUMBERHello Zoe Scott,, It was nice to speak with you at the end of last week. As per our discussion, you will return to the [facility] Intensive Outpatient Program, General Mental Health Treatment Track.  Please see the information below: Start Date: Monday 16-Jul-2019 at 10 AM Schedule: Mondays, Wednesdays, Thursdays from 10 AM - 1 PM Primary Clinician (social worker): Yvetta Coder, LCSW Medication Provider: Louie Casa,, APRN Zoom Meeting ID's and pass codes for week of 07-16-2019-2019/07/19 **All meetings for the week will use the same ID and Passcode.                         Please join each meeting at 9:55 AM so that we can begin group on time.** Meeting ID: [id number]: [id number] Link: [email or web address] to this message are multiple documents regarding the following topics: Patient consent Patient Telehealth Start-up instructions Group Rules    Please call us if you have any questions or concerns. We look forward to working with you. Sincerely, Robley Fries, LCSW Clinical Coordinator Tel: [phone] Fax: [phone] Solmon Ice, LCSW2021-05-077:47 AM

## 2019-06-21 NOTE — Group Note
Group Session 3rd:  Group TherapyGroup Start Time:   12:10 PM      End Time:   1:00 PMFacilitators:  Branda Chaudhary, LCSWVIDEO TELEHEALTH VISIT, PLATFORM OTHER THAN MYCHART: This clinician is conducting this visit at a site within our enrolled clinical location..  For this visit the clinician and patient were present via interactive audio & video telecommunications system that permits real-time communications, via a platform other than MyChart.Platforms other than MyChart may be used only during the COVID-19 crisis, when MyChart is not an option, and video is required for clinical reasons. Reason for choice of video platform other than MyChart: Currently [facility] [place] has identified Zoom as the appropriate telehealth platform to facilitate IOP Group Therapy.PLEASE NOTE: CONSENT FOR THIS VISIT IS DIFFERENT THAN FOR MYCHART VIDEO VISITS! Verbal consent must be obtained by the clinician or staff prior to starting visit.  Read the Following Statements to Video Visit Participants:  ?(Name of clinician) will be conducting your video visit today. Before we begin, we must obtain your consent for today's visit.Can you confirm your name, date of birth, and state where you (or your child, if applicable) are currently located?By consenting, you understand that today's visit will be conducted through video conferencing technology other than our usual secure service. While we do everything possible to ensure your privacy, we may not be able to guarantee the security of this particular platform. You understand that the visit will not be recorded, and that we will be documenting this visit in our electronic medical record, and that we may disclose records concerning this interaction to other clinicians. You understand that a video visit may limit our ability to diagnose your condition.  You understand that if you are experiencing a medical emergency, you will be directed to dial 9-1-1 as we are not able to connect you directly. You also understand if your condition changes after this visit and you need of further care, you will contact our office. Do you agree to this consent, knowing that you can change your decision at any time? Do you have any other questions before we proceed??Patient consent given for video visit: yesState patient is located in: [place]: at patient's home addressThe clinician is appropriately licensed in the above state to provide care for this visit.Other individuals present during the telehealth encounter and their role/relation: Identified group facilitator(s), support staff as needed, and fellow group participants.Session Focus Connection between feelings, thoughts and behaviorsDevelop skills to manage issues and improve functioningStatus of symptoms / issuesTreatment goal and skill use Number of Participants 12 Treatment Modality Group Therapy and Psycho-education Interventions/Education Assisted patient addressing stressful life situationsAssisted patient with strategies for managing illness/symptomsEncouraged increased initiation in groupsEncouraged patient to increase treatment focusEncouraged patient to verbalize, rather than enactEncourages patient to provide constructive feedback to othersEncouraged acceptance, tolerance and respect for othersHelped decrease isolation/increase sense of belongingHelped identify feelings, thoughts and behaviors connectionHelped increase awareness of feelings, thoughts and behaviorsHelped patient acquire skills to build self-confidenceHelped patient identify strengthsHelped patient increase ability to gain support from othersHelped patient increase interpersonal effectivenessHelped patient increase sense of acceptance by othersProvided assistance problem-solving challengesProvided positive reinforcement for successesProvided reinforcement for patient contributions to othersProvided reinforcement for use of adaptive coping skills Attendance            AttendedEngagement InterventionsNot applicable - patient attended group without difficulty Degree of Participation Moderate Behavior Alert, Calm and Interactive Speech/Thought Process Directed and Organized Affect/Mood Euthymic and Stable Insight Moderate Judgment Moderate Response to Interventions Attentive, Engaged and Interested Individualization In  today?s third group a quote by psychologist Myer Haff was shared. ?Beneath every behavior there is a feeling.  And beneath each feeling is a need.  And when we meet that need rather than focus on the behavior, we begin to deal with the cause, not the symptom.?  Group members were encouraged to reflect on the quote and share. Patient shared that she struggles to find the energy to take care of her 2yo son. His grandmother takes him for one day/week but she says she has no other help.  Writer encouraged her and group to brainstorm ways to get additional supports in place and to be willing to let people help if they are able.  Towards end of group writer also reviewed a list of concrete, natural ways to increase dopamine in our systems including: regular exercise, sunlight, taking probiotics, increasing protein in diet and decreasing saturated fats. Patient offered sincere sounding congratulations to a peer graduating program today.Patient did not express any suicidal or homicidal thoughts, plan or intent during this group. Plan Continue to assist patient with skills to improve functionContinue to assist patient with skills to manage symptomsContinue to reinforce use of positive coping skillsContinue to review and assess symptoms / issuesContinue to review and assess treatment goals / skill useContinue per Plan of Care / Interdisciplinary Plan of Care Leannah Guse, LCSW4/12/20213:49 PM

## 2019-06-21 NOTE — Group Note
Group Session:  Group TherapyGroup Start Time:   10:00 AM      End Time:  11:00 AMFacilitators:  Cristino, Dorene Grebe, LCSW; [place], Nicholas, PCT VIDEO TELEHEALTH VISIT, PLATFORM OTHER THAN MYCHART: This clinician is conducting this visit at a site within our enrolled clinical location..  For this visit the clinician and patient were present via interactive audio & video telecommunications system that permits real-time communications, via a platform other than MyChart.Platforms other than MyChart may be used only during the COVID-19 crisis, when MyChart is not an option, and video is required for clinical reasons. Reason for choice of video platform other than MyChart: Currently [facility] has identified Zoom as the appropriate telehealth platform to facilitate IOP Group Therapy.PLEASE NOTE: CONSENT FOR THIS VISIT IS DIFFERENT THAN FOR MYCHART VIDEO VISITS! Verbal consent must be obtained by the clinician or staff prior to starting visit.  Read the Following Statements to Video Visit Participants:  ?(Name of clinician) will be conducting your video visit today. Before we begin, we must obtain your consent for today's visit.Can you confirm your name, date of birth, and state where you (or your child, if applicable) are currently located?By consenting, you understand that today's visit will be conducted through video conferencing technology other than our usual secure service. While we do everything possible to ensure your privacy, we may not be able to guarantee the security of this particular platform. You understand that the visit will not be recorded, and that we will be documenting this visit in our electronic medical record, and that we may disclose records concerning this interaction to other clinicians. You understand that a video visit may limit our ability to diagnose your condition.  You understand that if you are experiencing a medical emergency, you will be directed to dial 9-1-1 as we are not able to connect you directly. You also understand if your condition changes after this visit and you need of further care, you will contact our office. Do you agree to this consent, knowing that you can change your decision at any time? Do you have any other questions before we proceed??Patient consent given for video visit: yes State patient is located in: [place] [facility] clinician is appropriately licensed in the above state to provide care for this visit.Other individuals present during the telehealth encounter and their role/relation: Identified group facilitator(s), support staff as needed, and fellow group participants.[place]-Suicide Severity Rating Scale Since Last Contact [place] - Suicide Severity Screen    Most Recent Value Have you wished you were dead or wished you could go to sleep and not wake up?  No Have you actually had any thoughts of killing yourself?   No Have you ever done anything, started to do anything, or prepared to do anything to end your life?   No [none since last assessed at [facility].] [place] Suicide Risk Level  Low Risk  Patient also evaluated by Sharlynn Oliphant Dinisco, APRN during 2nd group today. Session Focus Connection between feelings, thoughts and behaviorsDevelop skills to manage issues and improve functioningStatus of symptoms / issuesTreatment goal and skill useDaily goal setting Number of Participants 12 Treatment Modality Group Therapy Interventions/Education Assisted patient addressing stressful life situationsAssisted patient with discharge planningAssisted patient with strategies for addressing peer issuesAssisted patient with strategies for managing illness/symptomsAssisted patient with terminationEncouraged increased cooperation with othersEncouraged increased initiation in groupsEncouraged patient to increase treatment focusEncouraged patient to verbalize, rather than enactEncourages patient to provide constructive feedback to othersAssisted patient with strategies for addressing family issuesAssisted patient with strategies  for addressing school issuesEncouraged acceptance, tolerance and respect for othersEncouraged appropriate expression of thoughts and feelingsEncouraged improved listening skillsEncouraged patient to request as-needed help solving problemsHelped decrease isolation/increase sense of belongingHelped identify feelings, thoughts and behaviors connectionHelped increase awareness of feelings, thoughts and behaviorsHelped patient acquire skills to build self-confidenceHelped patient identify strengthsHelped patient increase ability to gain support from othersHelped patient increase comfort with othersHelped patient increase interpersonal effectivenessHelped patient increase sense of acceptance by othersProvided assistance problem-solving challengesProvided positive reinforcement for successesProvided reinforcement for patient contributions to othersProvided reinforcement for use of adaptive coping skills Attendance            AttendedEngagement InterventionsNot applicable - patient attended group without difficulty Degree of Participation Moderate Behavior Alert, Cooperative, Interactive and Passive Speech/Thought Process Coherent and Directed Affect/Mood Anxious and Depressed Insight Fair Judgment Fair Response to Interventions Attentive, Engaged, Interested and Receptive Individualization Group #1Check InPatient identified goals for this week as to be on track again....  I want to start feeling more confident of getting out the house...  And eating more. Patient identified the 3 things that went well or she felt grateful for as: 1) my new radio in my car. 2) my son. 3) my family for sticking by my side.  Patient scored her symptoms with a rating scale of 0=none and 10 = highest. Patient rated symptoms as follows:  Depression = 8; Irritability = 8; Rumination = 7, Racing Thoughts = 7; Anxiety = 8; Sleep Problems = 8; Self-Injury = 0; Hallucinations = 0; Suicidal Thoughts = 0; Homicidal Thoughts = 0. Patient identified the skill or skills she will practice with intention before next group session as think about my son and push myself. Patient reported taking medications as prescribed. Patient reported medication concern as Maybe I need to up my dose. Patient meeting with [facility] nurse practitioner today. Plan Continue to assist patient with skills to improve functionContinue to assist patient with skills to manage symptomsContinue to reinforce use of positive coping skillsContinue to review and assess symptoms / issuesContinue to review and assess treatment goals / skill useContinue per Plan of Care / Interdisciplinary Plan of Care Davona Kinoshita Cristino, LCSW4/12/20214:16 PM

## 2019-06-22 NOTE — Care Coordination-Inpatient
This Clinical research associate received a IT sales professional from [name] with [employer] Phone # [phone].  This Clinical research associate called Geralyn Flash back several times as her voicemail box is full. This Clinical research associate spoke to [place] who was seeking to discuss patient's care. This Clinical research associate requested release of information. Geralyn Flash agreed to obtain release of information authorization for [facility].Wilmer Berryhill Cristino, LCSW4/13/202111:10 AM

## 2019-06-23 ENCOUNTER — Encounter: Admit: 2019-06-23 | Payer: PRIVATE HEALTH INSURANCE | Primary: Cardiovascular Disease

## 2019-06-23 ENCOUNTER — Ambulatory Visit: Admit: 2019-06-23 | Payer: PRIVATE HEALTH INSURANCE | Primary: Cardiovascular Disease

## 2019-06-23 ENCOUNTER — Inpatient Hospital Stay: Admit: 2019-06-23 | Discharge: 2019-06-23 | Payer: MEDICAID | Primary: Cardiovascular Disease

## 2019-06-23 DIAGNOSIS — R251 Tremor, unspecified: Secondary | ICD-10-CM

## 2019-06-23 DIAGNOSIS — F411 Generalized anxiety disorder: Secondary | ICD-10-CM

## 2019-06-23 DIAGNOSIS — F1210 Cannabis abuse, uncomplicated: Secondary | ICD-10-CM

## 2019-06-23 DIAGNOSIS — F332 Major depressive disorder, recurrent severe without psychotic features: Secondary | ICD-10-CM

## 2019-06-23 DIAGNOSIS — F3189 Other bipolar disorder: Secondary | ICD-10-CM

## 2019-06-23 NOTE — Group Note
Group Session:  Group TherapyGroup Start Time:   10:00 AM      End Time:  11:00 AMFacilitators:  Cristino, Dorene Grebe, LCSW; [place], Nicholas, PCT VIDEO TELEHEALTH VISIT, PLATFORM OTHER THAN MYCHART: This clinician is conducting this visit at a site within our enrolled clinical location..  For this visit the clinician and patient were present via interactive audio & video telecommunications system that permits real-time communications, via a platform other than MyChart.Platforms other than MyChart may be used only during the COVID-19 crisis, when MyChart is not an option, and video is required for clinical reasons. Reason for choice of video platform other than MyChart: Currently [facility] has identified Zoom as the appropriate telehealth platform to facilitate IOP Group Therapy.PLEASE NOTE: CONSENT FOR THIS VISIT IS DIFFERENT THAN FOR MYCHART VIDEO VISITS! Verbal consent must be obtained by the clinician or staff prior to starting visit.  Read the Following Statements to Video Visit Participants:  ?(Name of clinician) will be conducting your video visit today. Before we begin, we must obtain your consent for today's visit.Can you confirm your name, date of birth, and state where you (or your child, if applicable) are currently located?By consenting, you understand that today's visit will be conducted through video conferencing technology other than our usual secure service. While we do everything possible to ensure your privacy, we may not be able to guarantee the security of this particular platform. You understand that the visit will not be recorded, and that we will be documenting this visit in our electronic medical record, and that we may disclose records concerning this interaction to other clinicians. You understand that a video visit may limit our ability to diagnose your condition.  You understand that if you are experiencing a medical emergency, you will be directed to dial 9-1-1 as we are not able to connect you directly. You also understand if your condition changes after this visit and you need of further care, you will contact our office. Do you agree to this consent, knowing that you can change your decision at any time? Do you have any other questions before we proceed??Patient consent given for video visit: yes State patient is located in: [place] [facility] clinician is appropriately licensed in the above state to provide care for this visit.Other individuals present during the telehealth encounter and their role/relation: Identified group facilitator(s), support staff as needed, and fellow group participants.[place]-Suicide Severity Rating Scale Since Last ContactColumb[place] - Suicide Severity Screen    Most Recent Value Have you wished you were dead or wished you could go to sleep and not wake up?  No Have you actually had any thoughts of killing yourself?   No Have you ever done anything, started to do anything, or prepared to do anything to end your life?   No [place] Suicide Risk Level  Low Risk  Session Focus Connection between feelings, thoughts and behaviorsDevelop skills to manage issues and improve functioningStatus of symptoms / issuesTreatment goal and skill useDaily goal setting Number of Participants 12 Treatment Modality Group Therapy Interventions/Education Assisted patient addressing stressful life situationsAssisted patient with discharge planningAssisted patient with strategies for addressing peer issuesAssisted patient with strategies for managing illness/symptomsAssisted patient with terminationEncouraged increased cooperation with othersEncouraged increased initiation in groupsEncouraged patient to increase treatment focusEncouraged patient to verbalize, rather than enactEncourages patient to provide constructive feedback to othersAssisted patient with strategies for addressing family issuesAssisted patient with strategies for addressing school issuesEncouraged acceptance, tolerance and respect for othersEncouraged appropriate expression of thoughts and feelingsEncouraged improved listening  skillsEncouraged patient to request as-needed help solving problemsHelped decrease isolation/increase sense of belongingHelped identify feelings, thoughts and behaviors connectionHelped increase awareness of feelings, thoughts and behaviorsHelped patient acquire skills to build self-confidenceHelped patient identify strengthsHelped patient increase ability to gain support from othersHelped patient increase comfort with othersHelped patient increase interpersonal effectivenessHelped patient increase sense of acceptance by othersProvided assistance problem-solving challengesProvided positive reinforcement for successesProvided reinforcement for patient contributions to othersProvided reinforcement for use of adaptive coping skills Attendance            AttendedEngagement InterventionsNot applicable - patient attended group without difficulty Degree of Participation Active Behavior Alert, Interactive and Restless Speech/Thought Process Coherent, Directed, Distracted and Relevant Affect/Mood Anxious and Depressed Insight Fair Judgment Fair Response to Interventions Engaged and Receptive Individualization Patient identified the 3 things that went well or she felt grateful for as: 1) I have time to go to the gym. 2) my mom to watch my son for a couple hours. 3) fixing my car's radio.  Patient scored her symptoms with a rating scale of 0=none and 10 = highest.  Patient rated symptoms as follows:  Depression = 6; Irritability = 5; Rumination = 5, Racing Thoughts = 5; Anxiety = 5; Sleep Problems = 5 (Patient reported interrupted sleep because of her son waking up.  Patient reports that once a week she has difficulty falling back to sleep); Self-Injury = 0; Hallucinations = 0; Suicidal Thoughts = 0; Homicidal Thoughts = 0. Patient identified the skill or skills she will practice with intention before next group session getting out possibly going to the park or shopping or to the gym. Patient reported taking medications as prescribed. Patient reported she did not have her Lithium level drawn. Patient stated she will try to have it done tomorrow morning before group. Plan Continue to assist patient with skills to improve functionContinue to assist patient with skills to manage symptomsContinue to reinforce use of positive coping skillsContinue to review and assess symptoms / issuesContinue to review and assess treatment goals / skill useContinue per Plan of Care / Interdisciplinary Plan of Care Bailei Buist Cristino, LCSW4/14/20213:50 PM

## 2019-06-23 NOTE — Group Note
Group Session:  Group TherapyGroup Start Time:   11:10 AM      End Time:  12:00 PMFacilitators:  [name], LCSWVIDEO TELEHEALTH VISIT, PLATFORM OTHER THAN MYCHART: This clinician is conducting this visit at a site within our enrolled clinical location..  For this visit the clinician and patient were present via interactive audio & video telecommunications system that permits real-time communications, via a platform other than MyChart.Platforms other than MyChart may be used only during the COVID-19 crisis, when MyChart is not an option, and video is required for clinical reasons. Reason for choice of video platform other than MyChart: Currently [facility] has identified Zoom as the appropriate telehealth platform to facilitate IOP Group Therapy.PLEASE NOTE: CONSENT FOR THIS VISIT IS DIFFERENT THAN FOR MYCHART VIDEO VISITS! Verbal consent must be obtained by the clinician or staff prior to starting visit.  Read the Following Statements to Video Visit Participants:  ?(Name of clinician) will be conducting your video visit today. Before we begin, we must obtain your consent for today's visit.Can you confirm your name, date of birth, and state where you (or your child, if applicable) are currently located?By consenting, you understand that today's visit will be conducted through video conferencing technology other than our usual secure service. While we do everything possible to ensure your privacy, we may not be able to guarantee the security of this particular platform. You understand that the visit will not be recorded, and that we will be documenting this visit in our electronic medical record, and that we may disclose records concerning this interaction to other clinicians. You understand that a video visit may limit our ability to diagnose your condition.  You understand that if you are experiencing a medical emergency, you will be directed to dial 9-1-1 as we are not able to connect you directly. You also understand if your condition changes after this visit and you need of further care, you will contact our office. Do you agree to this consent, knowing that you can change your decision at any time? Do you have any other questions before we proceed??Patient consent given for video visit: yesState patient is located in: [facility] [place] [facility] clinician is appropriately licensed in the above state to provide care for this visit.Other individuals present during the telehealth encounter and their role/relation: Identified group facilitator(s), support staff as needed, and fellow group participants.Session Focus Connection between feelings, thoughts and behaviorsDevelop skills to manage issues and improve functioningStatus of symptoms / issuesTreatment goal and skill useDaily goal setting Number of Participants 12 Treatment Modality Group Therapy Interventions/Education Assisted patient addressing stressful life situationsAssisted patient with strategies for addressing peer issuesAssisted patient with strategies for managing illness/symptomsEncouraged increased cooperation with othersEncouraged increased initiation in groupsEncouraged patient to increase treatment focusEncouraged patient to verbalize, rather than enactEncourages patient to provide constructive feedback to othersAssisted patient with strategies for addressing family issuesAssisted patient with strategies for addressing school issuesEncouraged acceptance, tolerance and respect for othersEncouraged appropriate expression of thoughts and feelingsEncouraged improved listening skillsEncouraged patient to request as-needed help solving problemsHelped decrease isolation/increase sense of belongingHelped identify feelings, thoughts and behaviors connectionHelped increase awareness of feelings, thoughts and behaviorsHelped patient acquire skills to build self-confidenceHelped patient identify strengthsHelped patient increase ability to gain support from othersHelped patient increase comfort with othersHelped patient increase interpersonal effectivenessHelped patient increase sense of acceptance by othersProvided assistance problem-solving challengesProvided positive reinforcement for successesProvided reinforcement for patient contributions to othersProvided reinforcement for use of adaptive coping skills Attendance  AttendedEngagement InterventionsNot applicable - patient attended group without difficulty Degree of Participation Active Behavior Alert, Cooperative and Interactive Speech/Thought Process Coherent, Directed and Relevant Affect/Mood Anxious and Depressed Insight Fair Judgment Fair Response to Interventions Attentive, Engaged, Interested and Receptive Individualization Summary Note Group 2During Zoom group therapy patient actively participated in the discussion of the sleep cycle and the importance of good sleep hygiene. Good sleep hygiene can improve your physical and mental health and overall quality of life. The group identified the benefits of getting good sleep: increase productivity, energy concentration, frustration tolerance (can deal with things better) and improves mood. The group reviewed tips for improving sleep hygiene such as go to bed when sleepy, develop sleep rituals, get up and do a relaxing activity if can?t fall asleep, don?t take worries to bed, take a hot  shower/bath, have a light snack before bed, avoid caffeine, nicotine  or alcohol, get up and go to bed at the same time, avoid naps, refrain from exercising at least 2 hours before e bed, do not use bedroom  for work, optimize sleep environment, do not watch the clock and use sunlight to set biological clock.Patient read one of the tips from the worksheet shared on the screen to the group. Patient shared that she uses a lavender soap when she takes a shower. She stated it feels soothing and calm before bed.Patient reported not experiencing any current suicidal or homicidal thought, plan or intent. Plan Continue to assist patient with skills to improve functionContinue to assist patient with skills to manage symptomsContinue to reinforce use of positive coping skillsContinue to review and assess symptoms / issuesContinue to review and assess treatment goals / skill useContinue per Plan of Care / Interdisciplinary Plan of Care [name], LCSW4/14/20214:34 PM

## 2019-06-24 ENCOUNTER — Ambulatory Visit: Admit: 2019-06-24 | Payer: PRIVATE HEALTH INSURANCE | Primary: Cardiovascular Disease

## 2019-06-24 ENCOUNTER — Inpatient Hospital Stay: Admit: 2019-06-24 | Discharge: 2019-06-24 | Payer: MEDICAID | Primary: Cardiovascular Disease

## 2019-06-24 DIAGNOSIS — F1210 Cannabis abuse, uncomplicated: Secondary | ICD-10-CM

## 2019-06-24 DIAGNOSIS — F3189 Other bipolar disorder: Secondary | ICD-10-CM

## 2019-06-24 DIAGNOSIS — F411 Generalized anxiety disorder: Secondary | ICD-10-CM

## 2019-06-24 DIAGNOSIS — F332 Major depressive disorder, recurrent severe without psychotic features: Secondary | ICD-10-CM

## 2019-06-24 DIAGNOSIS — R251 Tremor, unspecified: Secondary | ICD-10-CM

## 2019-06-24 NOTE — Group Note
Group Session:  Group TherapyGroup Start Time:   11:10 AM      End Time:  12:00 PMFacilitators:  Daneisha Surges, NPVIDEO TELEHEALTH VISIT, PLATFORM OTHER THAN MYCHART: This clinician is conducting this visit at a site within our enrolled clinical location..  For this visit the clinician and patient were present via interactive audio & video telecommunications system that permits real-time communications, via a platform other than MyChart.Platforms other than MyChart may be used only during the COVID-19 crisis, when MyChart is not an option, and video is required for clinical reasons. Reason for choice of video platform other than MyChart: Currently [facility] has identified Zoom as the appropriate telehealth platform to facilitate IOP Group Therapy.PLEASE NOTE: CONSENT FOR THIS VISIT IS DIFFERENT THAN FOR MYCHART VIDEO VISITS! Verbal consent must be obtained by the clinician or staff prior to starting visit.  Read the Following Statements to Video Visit Participants:  ?(Name of clinician) will be conducting your video visit today. Before we begin, we must obtain your consent for today's visit.Can you confirm your name, date of birth, and state where you (or your child, if applicable) are currently located?By consenting, you understand that today's visit will be conducted through video conferencing technology other than our usual secure service. While we do everything possible to ensure your privacy, we may not be able to guarantee the security of this particular platform. You understand that the visit will not be recorded, and that we will be documenting this visit in our electronic medical record, and that we may disclose records concerning this interaction to other clinicians. You understand that a video visit may limit our ability to diagnose your condition.  You understand that if you are experiencing a medical emergency, you will be directed to dial 9-1-1 as we are not able to connect you directly. You also understand if your condition changes after this visit and you need of further care, you will contact our office. Do you agree to this consent, knowing that you can change your decision at any time? Do you have any other questions before we proceed??Patient consent given for video visit: yesState patient is located in: [place] @ home address The clinician is appropriately licensed in the above state to provide care for this visit.Other individuals present during the telehealth encounter and their role/relation: Identified group facilitator(s), support staff as needed, and fellow group participants. Session Focus Connection between feelings, thoughts and behaviorsDevelop skills to manage issues and improve functioning Number of Participants 12 Treatment Modality Group Therapy Interventions/Education Encouraged appropriate expression of thoughts and feelingsHelped identify feelings, thoughts and behaviors connectionHelped increase awareness of feelings, thoughts and behaviors Attendance            AttendedEngagement InterventionsNot applicable - patient attended group without difficulty Degree of Participation Moderate Behavior Appropriate Speech/Thought Process Coherent Affect/Mood Stable Insight Fair Judgment Fair Response to Interventions Attentive  Individualization Zoe Scott participated in the body sensations and emotions group. She participated in the guided meditations including association between thoughts, emotions, and sensations as well as mindfulness of thoughts. She did not share her personal experience and was receptive to  peers throughout the group.  Plan Continue to assist patient with skills to improve functionContinue to assist patient with skills to manage symptoms

## 2019-06-24 NOTE — Group Note
Group Session:  Group TherapyGroup Start Time:   10:00 AM      End Time:  11:00 AMFacilitators:  Cristino, Dorene Grebe, LCSW; [place], Janyth Pupa, PCTVIDEO TELEHEALTH VISIT, PLATFORM OTHER THAN MYCHART: This clinician is conducting this visit at a site within our enrolled clinical location..  For this visit the clinician and patient were present via interactive audio & video telecommunications system that permits real-time communications, via a platform other than MyChart.Platforms other than MyChart may be used only during the COVID-19 crisis, when MyChart is not an option, and video is required for clinical reasons. Reason for choice of video platform other than MyChart: Currently [facility] has identified Zoom as the appropriate telehealth platform to facilitate IOP Group Therapy.PLEASE NOTE: CONSENT FOR THIS VISIT IS DIFFERENT THAN FOR MYCHART VIDEO VISITS! Verbal consent must be obtained by the clinician or staff prior to starting visit.  Read the Following Statements to Video Visit Participants:  ?(Name of clinician) will be conducting your video visit today. Before we begin, we must obtain your consent for today's visit.Can you confirm your name, date of birth, and state where you (or your child, if applicable) are currently located?By consenting, you understand that today's visit will be conducted through video conferencing technology other than our usual secure service. While we do everything possible to ensure your privacy, we may not be able to guarantee the security of this particular platform. You understand that the visit will not be recorded, and that we will be documenting this visit in our electronic medical record, and that we may disclose records concerning this interaction to other clinicians. You understand that a video visit may limit our ability to diagnose your condition.  You understand that if you are experiencing a medical emergency, you will be directed to dial 9-1-1 as we are not able to connect you directly. You also understand if your condition changes after this visit and you need of further care, you will contact our office. Do you agree to this consent, knowing that you can change your decision at any time? Do you have any other questions before we proceed??Patient consent given for video visit: yesState patient is located in: [place] [facility] clinician is appropriately licensed in the above state to provide care for this visit.[place] SUICIDE SEVERITY RATING SCALE - SINCE LAST CONTACTColumb[place] - Suicide Severity Screen    Most Recent Value Have you wished you were dead or wished you could go to sleep and not wake up?  No Have you actually had any thoughts of killing yourself?   No Have you ever done anything, started to do anything, or prepared to do anything to end your life?   No [patient reports none since last assessed.] [place] Suicide Risk Level  Low Risk  Session Focus Connection between feelings, thoughts and behaviorsDevelop skills to manage issues and improve functioningStatus of symptoms / issuesTreatment goal and skill useDaily goal setting Number of Participants 12 Treatment Modality Group Therapy Interventions/Education Assisted patient addressing stressful life situationsAssisted patient with discharge planningAssisted patient with strategies for addressing peer issuesAssisted patient with strategies for managing illness/symptomsAssisted patient with terminationEncouraged increased cooperation with othersEncouraged increased initiation in groupsEncouraged patient to increase treatment focusEncouraged patient to verbalize, rather than enactEncourages patient to provide constructive feedback to othersAssisted patient with strategies for addressing family issuesAssisted patient with strategies for addressing school issuesEncouraged acceptance, tolerance and respect for othersEncouraged appropriate expression of thoughts and feelingsEncouraged improved listening skillsEncouraged patient to request as-needed help solving problemsHelped decrease isolation/increase sense of belongingHelped identify  feelings, thoughts and behaviors connectionHelped increase awareness of feelings, thoughts and behaviorsHelped patient acquire skills to build self-confidenceHelped patient identify strengthsHelped patient increase ability to gain support from othersHelped patient increase comfort with othersHelped patient increase interpersonal effectivenessHelped patient increase sense of acceptance by othersProvided assistance problem-solving challengesProvided positive reinforcement for successesProvided reinforcement for patient contributions to othersProvided reinforcement for use of adaptive coping skills Attendance            AttendedEngagement InterventionsNot applicable - patient attended group without difficulty Degree of Participation Active Behavior Alert, Cooperative, Guarded and Interactive Speech/Thought Process Coherent, Directed and Relevant Affect/Mood Anxious, Depressed and Irritable Insight Fair Judgment Moderate Response to Interventions Attentive, Engaged, Interested and Receptive Individualization Group #1Check InPatient identified goal for this week as to be on track again....  I want to start feeling more confident of getting out the house...  And eating more.?She reported some slight gains in achieving this goal. Patient has indicated she wants to look into individual therapy and medication management provider as IOP is challenging because she has no one to watch her 68 year old son while she is virtually attending IOP treatment. Patient's son can be heard in the background during group. Patinet had to go off screen once or twice in caring for son. Patient identified the 3 things that went well or she felt grateful for as: 1) nice weather. 2) my mom..  She helped me out with my car. 3) roof over my head.  Patient scored her symptoms with a rating scale of 0=none and 10 = highest.  Patient rated symptoms as follows:  Depression = 6; Irritability = 5; Rumination = 6, Racing Thoughts = 5; Anxiety = 5; Sleep Problems = 0; Self-Injury = 0; Hallucinations = 0; Suicidal Thoughts = 0; Homicidal Thoughts = 0. Patient identified the skill or skills she will practice with intention before next group session better managing things and she elaborated appointments she has, zoom calls, school. Patient reported taking medications as prescribed. Patient reported she was running low on Lithium and Seroquel. This writer informed Vance Gather, RN. Patient has refill waiting for her a [facility] pharmacy since 06/21/19. Patient was informed.  Plan Continue to assist patient with skills to improve functionContinue to assist patient with skills to manage symptomsContinue to reinforce use of positive coping skillsContinue to review and assess symptoms / issuesContinue to review and assess treatment goals / skill useContinue per Plan of Care / Interdisciplinary Plan of Care Corinna Burkman Cristino, LCSW4/15/20215:56 PM

## 2019-06-24 NOTE — Group Note
Group Session:  Group TherapyGroup Start Time:   12:10 PM      End Time:   1:00 PMFacilitators:  Cristino, Dorene Grebe, LCSWVIDEO TELEHEALTH VISIT, PLATFORM OTHER THAN MYCHART: This clinician is conducting this visit at a site within our enrolled clinical location..  For this visit the clinician and patient were present via interactive audio & video telecommunications system that permits real-time communications, via a platform other than MyChart.Platforms other than MyChart may be used only during the COVID-19 crisis, when MyChart is not an option, and video is required for clinical reasons. Reason for choice of video platform other than MyChart: Currently [facility] has identified Zoom as the appropriate telehealth platform to facilitate IOP Group Therapy.PLEASE NOTE: CONSENT FOR THIS VISIT IS DIFFERENT THAN FOR MYCHART VIDEO VISITS! Verbal consent must be obtained by the clinician or staff prior to starting visit.  Read the Following Statements to Video Visit Participants:  ?(Name of clinician) will be conducting your video visit today. Before we begin, we must obtain your consent for today's visit.Can you confirm your name, date of birth, and state where you (or your child, if applicable) are currently located?By consenting, you understand that today's visit will be conducted through video conferencing technology other than our usual secure service. While we do everything possible to ensure your privacy, we may not be able to guarantee the security of this particular platform. You understand that the visit will not be recorded, and that we will be documenting this visit in our electronic medical record, and that we may disclose records concerning this interaction to other clinicians. You understand that a video visit may limit our ability to diagnose your condition.  You understand that if you are experiencing a medical emergency, you will be directed to dial 9-1-1 as we are not able to connect you directly. You also understand if your condition changes after this visit and you need of further care, you will contact our office. Do you agree to this consent, knowing that you can change your decision at any time? Do you have any other questions before we proceed??Patient consent given for video visit: yes State patient is located in: [place] [facility] clinician is appropriately licensed in the above state to provide care for this visit.Other individuals present during the telehealth encounter and their role/relation: Identified group facilitator(s), support staff as needed, and fellow group participants.Session Focus Connection between feelings, thoughts and behaviorsDevelop skills to manage issues and improve functioningStatus of symptoms / issuesTreatment goal and skill use Number of Participants 12 Treatment Modality Group Therapy Interventions/Education Assisted patient addressing stressful life situationsAssisted patient with discharge planningAssisted patient with strategies for addressing peer issuesAssisted patient with strategies for managing illness/symptomsAssisted patient with terminationEncouraged increased cooperation with othersEncouraged increased initiation in groupsEncouraged patient to increase treatment focusEncouraged patient to verbalize, rather than enactEncourages patient to provide constructive feedback to othersAssisted patient with strategies for addressing family issuesAssisted patient with strategies for addressing school issuesEncouraged acceptance, tolerance and respect for othersEncouraged appropriate expression of thoughts and feelingsEncouraged improved listening skillsEncouraged patient to request as-needed help solving problemsHelped decrease isolation/increase sense of belongingHelped identify feelings, thoughts and behaviors connectionHelped increase awareness of feelings, thoughts and behaviorsHelped patient acquire skills to build self-confidenceHelped patient identify strengthsHelped patient increase ability to gain support from othersHelped patient increase comfort with othersHelped patient increase interpersonal effectivenessHelped patient increase sense of acceptance by othersProvided assistance problem-solving challengesProvided positive reinforcement for successesProvided reinforcement for patient contributions to othersProvided reinforcement for use of adaptive coping skills Attendance  AttendedEngagement InterventionsNot applicable - patient attended group without difficulty Degree of Participation Active Behavior Alert, Calm, Cooperative and Interactive Speech/Thought Process Coherent, Directed and Relevant Affect/Mood Anxious, Depressed and Euthymic Insight Moderate Judgment Moderate Response to Interventions Attentive, Engaged, Interested and Receptive Individualization Group #3In preparation for the weekend, group members were given the opportunity to discuss any issues concerning them going into the weekend.  Patient reported no concerns about this weekend.  Patient offered constructive feedback to another group member who shared a concern.  She talked about challenges in relationships related to mental health and also about moving on. Each group member was invited to share weekend plans. Those who did not have plans were encouraged to identify some potential activities to engage in. Safety planning reviewed during group.  Plan Continue to assist patient with skills to improve functionContinue to assist patient with skills to manage symptomsContinue to reinforce use of positive coping skillsContinue to review and assess symptoms / issuesContinue to review and assess treatment goals / skill useContinue per Plan of Care / Interdisciplinary Plan of Care Zoe Scott Cristino, LCSW4/15/20212:59 PM

## 2019-06-24 NOTE — Group Note
Group Session:  Group TherapyGroup Start Time:   12:10 PM      End Time:   1:00 PMFacilitators:  Cristino, Dorene Grebe, LCSW VIDEO TELEHEALTH VISIT, PLATFORM OTHER THAN MYCHART: This clinician is conducting this visit at a site within our enrolled clinical location..  For this visit the clinician and patient were present via interactive audio & video telecommunications system that permits real-time communications, via a platform other than MyChart.Platforms other than MyChart may be used only during the COVID-19 crisis, when MyChart is not an option, and video is required for clinical reasons. Reason for choice of video platform other than MyChart: Currently [facility] has identified Zoom as the appropriate telehealth platform to facilitate IOP Group Therapy.PLEASE NOTE: CONSENT FOR THIS VISIT IS DIFFERENT THAN FOR MYCHART VIDEO VISITS! Verbal consent must be obtained by the clinician or staff prior to starting visit.  Read the Following Statements to Video Visit Participants:  ?(Name of clinician) will be conducting your video visit today. Before we begin, we must obtain your consent for today's visit.Can you confirm your name, date of birth, and state where you (or your child, if applicable) are currently located?By consenting, you understand that today's visit will be conducted through video conferencing technology other than our usual secure service. While we do everything possible to ensure your privacy, we may not be able to guarantee the security of this particular platform. You understand that the visit will not be recorded, and that we will be documenting this visit in our electronic medical record, and that we may disclose records concerning this interaction to other clinicians. You understand that a video visit may limit our ability to diagnose your condition.  You understand that if you are experiencing a medical emergency, you will be directed to dial 9-1-1 as we are not able to connect you directly. You also understand if your condition changes after this visit and you need of further care, you will contact our office. Do you agree to this consent, knowing that you can change your decision at any time? Do you have any other questions before we proceed??Patient consent given for video visit: yes State patient is located in: [place] [facility] clinician is appropriately licensed in the above state to provide care for this visit.Other individuals present during the telehealth encounter and their role/relation: Identified group facilitator(s), support staff as needed, and fellow group participants.Session Focus Connection between feelings, thoughts and behaviorsDevelop skills to manage issues and improve functioningStatus of symptoms / issuesTreatment goal and skill useDaily goal setting Number of Participants 11 Treatment Modality Group Therapy Interventions/Education Assisted patient addressing stressful life situationsAssisted patient with discharge planningAssisted patient with strategies for addressing peer issuesAssisted patient with strategies for managing illness/symptomsAssisted patient with terminationEncouraged increased cooperation with othersEncouraged increased initiation in groupsEncouraged patient to increase treatment focusEncouraged patient to verbalize, rather than enactEncourages patient to provide constructive feedback to othersAssisted patient with strategies for addressing family issuesAssisted patient with strategies for addressing school issuesEncouraged acceptance, tolerance and respect for othersEncouraged appropriate expression of thoughts and feelingsEncouraged improved listening skillsEncouraged patient to request as-needed help solving problemsHelped decrease isolation/increase sense of belongingHelped identify feelings, thoughts and behaviors connectionHelped increase awareness of feelings, thoughts and behaviorsHelped patient acquire skills to build self-confidenceHelped patient identify strengthsHelped patient increase ability to gain support from othersHelped patient increase comfort with othersHelped patient increase interpersonal effectivenessHelped patient increase sense of acceptance by othersProvided assistance problem-solving challengesProvided positive reinforcement for successesProvided reinforcement for patient contributions to othersProvided reinforcement for use of adaptive coping skills  Attendance            AttendedEngagement InterventionsNot applicable - patient attended group without difficulty Degree of Participation Active Behavior Alert, Cooperative and Interactive Speech/Thought Process Coherent, Directed and Relevant Affect/Mood Anxious, Depressed and Irritable Insight Fair Judgment Fair Response to Interventions Attentive, Engaged, Interested and Receptive Individualization Group #3Group facilitator started group by asking if any group members had any issues or concerns they wanted to address. No concerns or issues identified. This Clinical research associate read the reflection of the day ?Wisdom Awaits? followed by a quote about 3 C?s: Choice, Chance, Change.  REACH patients/current group members are receiving a letter via MyChart from [facility] Management Team dated 06/22/19 regarding possible transition from telehealth to in-person visits or a ?hybrid? setting. Group facilitator reviewed the letter with group and offered the opportunity for questions, comments, expression of concerns, and an opportunity to problem-solve any barriers to ongoing treatment with REACH should treatment go to in-person or a hybrid model. Patient appropriately engaged in this group discussion. Patient expressed concern I have no babysitter for my son. Patient reports she will not have any one to care for her son if she needs to come to [facility] office for IOP treatment. Patient indicates she will need to disengage from IOP treatment if she is asked to attend in-person.  Many group members expressed heightened anxiety related to group discussion. The last 5 minutes of group was used to practice a breathing and movement exercise. Patient reported feeling calm and accepting of the information about REACH. Patient reported no additional questions or concerns. Plan Continue to assist patient with skills to improve functionContinue to assist patient with skills to manage symptomsContinue to reinforce use of positive coping skillsContinue to review and assess symptoms / issuesContinue to review and assess treatment goals / skill useContinue per Plan of Care / Interdisciplinary Plan of Care Tereka Thorley Cristino, LCSW4/15/20218:29 AM

## 2019-06-24 NOTE — Treatment Summary
VIDEO TELEHEALTH VISIT, PLATFORM OTHER THAN MYCHART: This clinician is conducting this visit at a site within our enrolled clinical location..  For this visit the clinician and patient were present via interactive audio & video telecommunications system that permits real-time communications, via a platform other than MyChart.Platforms other than MyChart may be used only during the COVID-19 crisis, when MyChart is not an option, and video is required for clinical reasons. Reason for choice of video platform other than MyChart: Patient unable to access MyChart Video secondary to currently being in [employer] via [employer].PLEASE NOTE: CONSENT FOR THIS VISIT IS DIFFERENT THAN FOR MYCHART VIDEO VISITS! Verbal consent must be obtained by the clinician or staff prior to starting visit.  Read the Following Statements to Video Visit Participants:  ?(Name of clinician) will be conducting your video visit today. Before we begin, we must obtain your consent for today's visit.Can you confirm your name, date of birth, and state where you (or your child, if applicable) are currently located?By consenting, you understand that today's visit will be conducted through video conferencing technology other than our usual secure service. While we do everything possible to ensure your privacy, we may not be able to guarantee the security of this particular platform. You understand that the visit will not be recorded, and that we will be documenting this visit in our electronic medical record, and that we may disclose records concerning this interaction to other clinicians. You understand that a video visit may limit our ability to diagnose your condition.  You understand that if you are experiencing a medical emergency, you will be directed to dial 9-1-1 as we are not able to connect you directly. You also understand if your condition changes after this visit and you need of further care, you will contact our office. Do you agree to this consent, knowing that you can change your decision at any time? Do you have any other questions before we proceed??Patient consent given for video visit: yesState patient is located in: [facility] clinician is appropriately licensed in the above state to provide care for this visit.Other individuals present during the telehealth encounter and their role/relation: noneTotal time spent in medical video consultation (required if billing based on time):  10 minutes Start Time: 11:15 a.m.   End Time: 11:25 a.m.TREATMENT PLANNING NOTE: Information to be utilized to co-construct IOP treatment plan within the next week.This Clinical research associate met with patient 1:1 to discuss and co-construct treatment plan.  Patient's current symptoms were reviewed along with discussion related to treatment objectives, goals and interventions. Patient shared the following goals for herself: Be more stable and Be a better Rowena. Patient was asked to elaborate on these goals.  Patient elaborated as follows:Current data:  Patient is able to maintain some routine in her day.  She reports ?right now I have to force myself to do that. She states she is able to me her and her son's basic needs and some household tasks.Goal:  Patient identifies keeping ?routine with my son? including ?feed my son, care for him, take him to the park, go to school, groceries, laundry. Patient identifies she will have more motivation to engage in activities each day.  Patient reports at baseline, ?I feel very motivated.? and ?I am capable of anything. Patient also identifies experiencing decreased anxiety and depression would be a goal.  Patient states she would expect anxiety and depression to be reduced but not eliminated.  Patient reports at baseline, ?never a point when  I experience 0 anxiety and depression, it is always there. She would like the intensity and frequency of the symptoms to be reduced.Patient identified her long-term goal as:  ?Being stable.  Having my own shelter and having a career. This information will be utilized in the co-construction patient's IOP treatment plan within the next week.  Once treatment plan is finalized and agreed upon between patient and [facility] clinicians and treatment team, treatment plan to be found in the plan of care section of epic and under Chart Review [facility] note of Epic.Patient shared during this session that she is not certain she wants to continue in IOP treatment.  Patient reports, ?I do not have a babysitter.?  Patient is reporting it to be challenging trying to manage her 76 year old son wall attending IOP via the zoom sessions.  She states it is difficult to focus on the content of the groups and also pay attention to him and his needs.  Patient cannot identify any alternative care provider at this time.  Patient also reports she is not satisfied with I am getting meds weekly rather than a month supply of meds at a time. Patient states, ?I am trying to find someone that is going to do 1 on 1 with me instead of group. For now patient is agreeing to continue in [facility] IOP.  Patient agrees to maintain communication with [facility] IOP treatment team.  She is agreeable to all the guidelines and expectations of IOP at this time.  Patient agrees she will inform [facility] staff if she will be missing any treatment days and/or if she chooses to disengage from treatment.Ameila Weldon Cristino, [id number] PM

## 2019-06-25 NOTE — Other
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 reports no homicidal thoughts, no homicidal intent, no homicial plan. Patient reports no self-harming thougths and no thoughts to harm anyone else.] Associated Risk Factors Health Information/Screening Health Information/Screening   Performed by Clinician [Performed by RN, on or by the first day of IOP treatment. Please see health screenings PSY OP RN Intake Note.] Pain Assessment Pain Documentation Fall Risk Screening Nutrition Risk Screen Nutrition Health Screening  PSY RISK ASSESSMENT SAFE-T WITH C-SSRS C-SSRS: Suicidal Ideation:  Since Last Visit  WISH TO BE DEAD:  No  CURRENT SUICIDAL THOUGHTS:  No  Have you ever done anything, started to do anything or prepared to do anything to end your life?:  Yes  If yes, please add details:  Overdose in the past. Patient reports no preparatory acts since last assessment at [facility].  Was it within the past 3 months?:  NoMEDICATIONS Current Outpatient Medications Medication Sig ? hydrOXYzine (ATARAX) 25 mg tablet Take 1 tablet (25 mg total) by mouth every 4 (four) hours as needed (Anxiety). ? lithium (ESKALITH) 450 mg CR extended release tablet Take 1 tablet (450 mg total) by mouth 2 (two) times daily with breakfast and dinner. ? QUEtiapine (SEROQUEL) 50 mg Immediate Release tablet Take 3 tablets (150 mg total) by mouth 2 (two) times daily. ? zolpidem (AMBIEN) 5 mg tablet Take 1 tablet (5 mg total) by mouth nightly. No current facility-administered medications for this encounter.  MEDICAL HISTORY Past Medical History: Diagnosis Date ? Anxiety  ? Depression  ? Strep throat  ? Suicide attempt [facility] Code)   April 2020: Ingestion of pills and cut wrists OB History Gravida Para Term Preterm AB Living 0           SAB TAB Ectopic Molar Multiple Live Births             Past Surgical History: Procedure Laterality Date ? DENTAL SURGERY   ? HERNIA REPAIR Right age 70  RLQ area ? HERNIA REPAIR   ? TONSILLECTOMY Bilateral 2013 ? TONSILLECTOMY   Family History Problem Relation Age of Onset ? Depression Father  ? Anxiety disorder Father  ? Schizophrenia Maternal Aunt  Social History Socioeconomic History ? Marital status: Single   Spouse name: Not on file ? Number of children: Not on file ? Years of education: Not on file ? Highest education level: Not on file Occupational History ? Occupation: Unemployed Tobacco Use ? Smoking status: Never Smoker ? Smokeless tobacco: Never Used Substance and Sexual Activity ? Alcohol use: No ? Drug use: No ? Sexual activity: Yes   Partners: Male   Birth control/protection: Condom Relationships ? Social Manufacturing systems engineer on phone: Not on file   Gets together: Not on file   Attends religious service: Not on file   Active member of club or organization: Not on file   Attends meetings of clubs or organizations: Not on file   Relationship status: Not on file ? Intimate partner violence   Fear  of current or ex partner: No   Emotionally abused: No   Physically abused: No   Forced sexual activity: No Social History Narrative  05/11/19: Patient currently living with paternal grandmother and her 32 year old son in [place], [place]. Current open case with DCF from last inpatient hospitalization when she attempted suicide. Patient has high school degree and is currently attending [employer], taking nursing classes.  AllergiesPatient has no known allergies.PSYCHIATRIC  TREATMENT HISTORY Psychiatric Treatment History ? Inpatient Hospitalization Yes [facility] 06/2018 ? Partial Hospitalization No  ? Intensive Outpatient Yes  ? Extended Day Treatment No  ? Outpatient Treatment Yes no current treatment ? Emergency Room Visits No  ? Subacute Treatment No  ? [facility] Inpatient No  ? [facility] 30-Day Evaluation No  ? In-home Behavioral Health Services No  ? Inpatient Substance Abuse Treatment No  ? 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  Cannabis abuse F12.10  Generalized anxiety disorder F41.1  Level of Care/Treatment Track: [facility] IOP AdultFrequency: Three times a weekEstimated duration/length of stay in the program: 2 monthsTreatment Modalities: Pharmacological Management;Group Psychotherapy (individual and family therapy as needed.)Patient Strengths: expresses reasons to live/engage in productive activity;able to express feelings;able to define needs;able to engage others;able to live independently;good pre-morbid functioningPatient and/or Family Stated Short Term Personal Goal(s): Be more stable and Be a better [place].Patient and/or Family Stated Long Term Personal Goal(s): ?Being stable.  Having my own shelter and having a career.Goal of Treatment #1: Patient will present with stable mood and improved level of functioningGoal of Treatment #2: Patient will be able to identify and implement effective coping strategiesGoal of Treatment #3: Monitor response and manage psychiatric medicationsPLAN: Patient admitted to IOP level of care 06/21/19 General Mental Health Track, 3 days a week via Zoom video sessions for further evaluation, assessment, and more intensive treatment. Patient to be provided with medication management evaluation as needed. Patient will engage in a daily group forum where patient can acquire specific skills, strategies, techniques, and interventions to best support patient?s presenting concerns. Patient will receive individual and family therapy as needed. Co-construction of discharge plan ongoing. Electronically SignedProvider:  Yvetta Coder, LCSW4/16/20217:16 PM

## 2019-06-27 ENCOUNTER — Encounter: Admit: 2019-06-27 | Payer: PRIVATE HEALTH INSURANCE | Primary: Cardiovascular Disease

## 2019-06-28 ENCOUNTER — Inpatient Hospital Stay: Admit: 2019-06-28 | Discharge: 2019-06-28 | Payer: MEDICAID | Primary: Cardiovascular Disease

## 2019-06-28 ENCOUNTER — Ambulatory Visit: Admit: 2019-06-28 | Payer: PRIVATE HEALTH INSURANCE | Primary: Cardiovascular Disease

## 2019-06-28 DIAGNOSIS — R5383 Other fatigue: Secondary | ICD-10-CM

## 2019-06-28 DIAGNOSIS — F3189 Other bipolar disorder: Secondary | ICD-10-CM

## 2019-06-28 DIAGNOSIS — R251 Tremor, unspecified: Secondary | ICD-10-CM

## 2019-06-28 DIAGNOSIS — F411 Generalized anxiety disorder: Secondary | ICD-10-CM

## 2019-06-28 DIAGNOSIS — F1210 Cannabis abuse, uncomplicated: Secondary | ICD-10-CM

## 2019-06-28 LAB — LITHIUM LEVEL: BKR LITHIUM LEVEL: 0.74 mmol/L (ref 0.60–1.20)

## 2019-06-28 MED ORDER — ZOLPIDEM 5 MG TABLET
5 mg | ORAL_TABLET | Freq: Every evening | ORAL | 1 refills | Status: AC | PRN
Start: 2019-06-28 — End: 2019-08-11

## 2019-06-28 MED ORDER — LITHIUM CARBONATE ER 300 MG TABLET,EXTENDED RELEASE
300 mg | ORAL_TABLET | Freq: Every evening | ORAL | 1 refills | Status: AC
Start: 2019-06-28 — End: 2019-07-07

## 2019-06-28 NOTE — Group Note
Group Session:  Group TherapyGroup Start Time:   12:10 PM      End Time:   1:00 PMFacilitators:  [name], LCSW VIDEO TELEHEALTH VISIT, PLATFORM OTHER THAN MYCHART: This clinician is conducting this visit at a site within our enrolled clinical location..  For this visit the clinician and patient were present via interactive audio & video telecommunications system that permits real-time communications, via a platform other than MyChart.Platforms other than MyChart may be used only during the COVID-19 crisis, when MyChart is not an option, and video is required for clinical reasons. Reason for choice of video platform other than MyChart: Currently [facility] has identified Zoom as the appropriate telehealth platform to facilitate IOP Group Therapy.PLEASE NOTE: CONSENT FOR THIS VISIT IS DIFFERENT THAN FOR MYCHART VIDEO VISITS! Verbal consent must be obtained by the clinician or staff prior to starting visit.  Read the Following Statements to Video Visit Participants:  ?(Name of clinician) will be conducting your video visit today. Before we begin, we must obtain your consent for today's visit.Can you confirm your name, date of birth, and state where you (or your child, if applicable) are currently located?By consenting, you understand that today's visit will be conducted through video conferencing technology other than our usual secure service. While we do everything possible to ensure your privacy, we may not be able to guarantee the security of this particular platform. You understand that the visit will not be recorded, and that we will be documenting this visit in our electronic medical record, and that we may disclose records concerning this interaction to other clinicians. You understand that a video visit may limit our ability to diagnose your condition.  You understand that if you are experiencing a medical emergency, you will be directed to dial 9-1-1 as we are not able to connect you directly. You also understand if your condition changes after this visit and you need of further care, you will contact our office. Do you agree to this consent, knowing that you can change your decision at any time? Do you have any other questions before we proceed??Patient consent given for video visit: yesState patient is located in: [place] [facility] clinician is appropriately licensed in the above state to provide care for this visit.Other individuals present during the telehealth encounter and their role/relation: Identified group facilitator(s), support staff as needed, and fellow group participants.Session Focus Connection between feelings, thoughts and behaviorsDevelop skills to manage issues and improve functioningStatus of symptoms / issuesTreatment goal and skill useDaily goal setting Number of Participants 6 Treatment Modality Group Therapy Interventions/Education Assisted patient addressing stressful life situationsAssisted patient with strategies for addressing peer issuesAssisted patient with strategies for managing illness/symptomsEncouraged increased cooperation with othersEncouraged increased initiation in groupsEncouraged patient to increase treatment focusEncouraged patient to verbalize, rather than enactEncourages patient to provide constructive feedback to othersAssisted patient with strategies for addressing family issuesAssisted patient with strategies for addressing school issuesEncouraged acceptance, tolerance and respect for othersEncouraged appropriate expression of thoughts and feelingsEncouraged improved listening skillsEncouraged patient to request as-needed help solving problemsHelped decrease isolation/increase sense of belongingHelped identify feelings, thoughts and behaviors connectionHelped increase awareness of feelings, thoughts and behaviorsHelped patient acquire skills to build self-confidenceHelped patient identify strengthsHelped patient increase ability to gain support from othersHelped patient increase comfort with othersHelped patient increase interpersonal effectivenessHelped patient increase sense of acceptance by othersProvided assistance problem-solving challengesProvided positive reinforcement for successesProvided reinforcement for patient contributions to othersProvided reinforcement for use of adaptive coping skills Attendance  Absent due to MD/APRN MeetingEngagement InterventionsPatient did not return to group after APRN meeting Degree of Participation Non-verbal Behavior Calm Speech/Thought Process Barely audible Affect/Mood Depressed Insight Fair Judgment Fair Response to Interventions Disengaged Individualization Patient joined the co occurring group today. Patient attended 5 minutes of  3 rd group and then she met with Ervin Knack, NP for a medication management session   Patient did not return to group after her meeting with the RE[facility] medication provider. Group facilitator will follow up with patient's assigned clinician for follow up. Plan Continue to assist patient with skills to improve functionContinue to assist patient with skills to manage symptomsContinue to reinforce use of positive coping skillsContinue to review and assess symptoms / issuesContinue to review and assess treatment goals / skill useContinue per Plan of Care / Interdisciplinary Plan of Care [name], LCSW4/19/20213:42 PM

## 2019-06-28 NOTE — Care Coordination-Inpatient
This Clinical research associate called patient's DCF social worker [name] at [phone] but she did not answer and her voice mail box is full so a message could not be left. This Clinical research associate called [place] D[facility] office. [phone]. The [facility] office message states that the office is currently closed until further notice.Zoe Scott, LCSW4/19/20212:56 PM

## 2019-06-28 NOTE — Other
Group Session:  Group Therapy?Group Start Time:   10:00 AM      End Time:  11:00 AMFacilitators:  Cindie Crumbly, LCSWVIDEO TELEHEALTH VISIT, PLATFORM OTHER THAN MYCHART: This clinician is conducting this visit at a site within our enrolled clinical location..  For this visit the clinician and patient were present via interactive audio & video telecommunications system that permits real-time communications, via a platform other than MyChart.?Platforms other than MyChart may be used only during the COVID-19 crisis, when MyChart is not an option, and video is required for clinical reasons. ?Reason for choice of video platform other than MyChart: Currently [facility] has identified Zoom as the appropriate telehealth platform to facilitate IOP Group Therapy.?PLEASE NOTE: CONSENT FOR THIS VISIT IS DIFFERENT THAN FOR MYCHART VIDEO VISITS! ?Verbal consent must be obtained by the clinician or staff prior to starting visit.? Read the Following Statements to Video Visit Participants:? ??(Name of clinician) will be conducting your video visit today. Before we begin, we must obtain your consent for today's visit.Can you confirm your name, date of birth, and state where you (or your child, if applicable) are currently located?By consenting, you understand that today's visit will be conducted through video conferencing technology other than our usual secure service. While we do everything possible to ensure your privacy, we may not be able to guarantee the security of this particular platform. You understand that the visit will not be recorded, and that we will be documenting this visit in our electronic medical record, and that we may disclose records concerning this interaction to other clinicians. You understand that a video visit may limit our ability to diagnose your condition.? You understand that if you are experiencing a medical emergency, you will be directed to dial 9-1-1 as we are not able to connect you directly. You also understand if your condition changes after this visit and you need of further care, you will contact our office. Do you agree to this consent, knowing that you can change your decision at any time? Do you have any other questions before we proceed???Patient consent given for video visit: yes?State patient is located in: [place] At home LocationThe clinician is appropriately licensed in the above state to provide care for this visit.??Session Focus? Connection between feelings, thoughts and behaviorsDevelop skills to manage issues and improve functioningStatus of symptoms / issuesTreatment goal and skill useDaily goal setting Number of Participants? 6 Treatment Modality? Group Therapy Interventions/Education? Assisted patient addressing stressful life situationsAssisted patient with discharge planningAssisted patient with strategies for addressing peer issuesAssisted patient with strategies for managing illness/symptomsAssisted patient with terminationEncouraged increased cooperation with othersEncouraged increased initiation in groupsEncouraged patient to increase treatment focusEncouraged patient to verbalize, rather than enactEncourages patient to provide constructive feedback to othersAssisted patient with strategies for addressing family issuesAssisted patient with strategies for addressing school issuesEncouraged acceptance, tolerance and respect for othersEncouraged appropriate expression of thoughts and feelingsEncouraged improved listening skillsEncouraged patient to request as-needed help solving problemsHelped decrease isolation/increase sense of belongingHelped identify feelings, thoughts and behaviors connectionHelped increase awareness of feelings, thoughts and behaviorsHelped patient acquire skills to build self-confidenceHelped patient identify strengthsHelped patient increase ability to gain support from othersHelped patient increase comfort with othersHelped patient increase interpersonal effectivenessHelped patient increase sense of acceptance by othersProvided assistance problem-solving challengesProvided positive reinforcement for successesProvided reinforcement for patient contributions to othersProvided reinforcement for use of adaptive coping skills ?Attendance           ? Attended?Engagement InterventionsNot applicable - patient attended group without difficulty Degree of Participation?  Active Behavior Alert, Cooperative, Guarded and Interactive Speech/Thought Process Coherent, Directed and Relevant Affect/Mood Anxious, Depressed and Irritable Insight Fair Judgment Moderate Response to Interventions? Attentive, Engaged, Interested and Receptive Individualization?  SUMMARY NOTE Group 1:During IOP Group Therapy via ZOOM Patient self-reported on a scale of 0 = None to 10= Highest; patient described their most concerning symptomatology as; depression 8, irritability 7, rumination 6, racing thoughts 6, anxiety 5 and 0 for all other assessed symptomatology.  Patient report was congruent with the patient's observed presentation.  Patient reports goal for the week as:  ?Get medication on track and feel motivated?.  Patient identifies things that went well/grateful for; my son, nice whether he went to the park with him, a roof over my head.  Patient identified purposely working on the following skill; increased smoking marijuana, I need to smile more when I smile more I do feel different. Patient reports to be taking their medication as prescribed and reports no concerns noted at this time.  Patient reports she feels her medications started working well in the beginning and has not worked as well.  Patient identifies she is smoking a lot more marijuana co I do not like to feel the way of feeling I smoke am also spending money when I am sad ago shopping but after I feel guilty and worse for spending the money.  Patient identified a passive thought yesterday and reference to ?not being here any other suicidal thoughts or intentions and completed the full screening.  Patient reports currently to not be experiencing any ideation, intent, or plans in reference to suicidal or homicidal symptomatology. Plan? Continue to assist patient with skills to improve functionContinue to assist patient with skills to manage symptomsContinue to reinforce use of positive coping skillsContinue to review and assess symptoms / issuesContinue to review and assess treatment goals / skill useContinue per Plan of Care / Interdisciplinary Plan of Care [place] - Suicide Severity Screen    Most Recent Value Have you wished you were dead or wished you could go to sleep and not wake up?  Yes Have you actually had any thoughts of killing yourself?   No Have you been thinking about how you might kill yourself?   No Have you had these thoughts and had some intention of acting on them?  No Have you started to work out or worked out the details of how to kill yourself?   No Have you ever done anything, started to do anything, or prepared to do anything to end your life?   No Was this in the past 3 months?  No [place] Suicide Risk Level  Low Risk Cindie Crumbly, LCSW4/19/20215:24 PM

## 2019-06-28 NOTE — Progress Notes
VIDEO TELEHEALTH VISIT, PLATFORM OTHER THAN MYCHART: This clinician is conducting this visit at a site within our enrolled clinical location..  For this visit the clinician and patient were present via interactive audio & video telecommunications system that permits real-time communications, via a platform other than MyChart.Platforms other than MyChart may be used only during the COVID-19 crisis, when MyChart is not an option, and video is required for clinical reasons. Reason for choice of video platform other than MyChart: Patient unable to access MyChart Video secondary to currently being in [employer] via [employer].PLEASE NOTE: CONSENT FOR THIS VISIT IS DIFFERENT THAN FOR MYCHART VIDEO VISITS! Verbal consent must be obtained by the clinician or staff prior to starting visit.  Read the Following Statements to Video Visit Participants:  ?(Name of clinician) will be conducting your video visit today. Before we begin, we must obtain your consent for today's visit.Can you confirm your name, date of birth, and state where you (or your child, if applicable) are currently located?By consenting, you understand that today's visit will be conducted through video conferencing technology other than our usual secure service. While we do everything possible to ensure your privacy, we may not be able to guarantee the security of this particular platform. You understand that the visit will not be recorded, and that we will be documenting this visit in our electronic medical record, and that we may disclose records concerning this interaction to other clinicians. You understand that a video visit may limit our ability to diagnose your condition.  You understand that if you are experiencing a medical emergency, you will be directed to dial 9-1-1 as we are not able to connect you directly. You also understand if your condition changes after this visit and you need of further care, you will contact our office. Do you agree to this consent, knowing that you can change your decision at any time? Do you have any other questions before we proceed??Patient consent given for video visit: yesState patient is located in: [place]The clinician is appropriately licensed in the above state to provide care for this visit.Other individuals present during the telehealth encounter and their role/relation: noneTotal time spent in medical video consultation (required if billing based on time): 24 minutes  Start Time: 12:12  End Time: 12:36[facility] [phone] [address] AvenueBridgeport [place] [address]HYQMV Number: [phone] Number: [phone] Outpatient Psychiatry MD/LIP Progress Note4/19/2021Start Time:  12:12      End Time:  12:36Session Type:  Medication ManagementAmanda Scott is a 24 y.o., Single, female.SUBJECTIVE Chief Complaint: I just feel more depressed. Interim History:  24 year old female with past psychiatric history of bipolar disrder (manic and depressive) and a history of suicide attempts and anxiety disorder referred to [facility] adult IOP by [facility] following an inpatient admission for worsening depression and anxiety symptoms at the beginning of March 2021. However, she finished a 1/2 of a group and then left the program. On Thursday, 06/17/2019, Zoe Scott called REACH requesting a refill on her medication.  She reported feeling sick and very nauseated and lethargic. She was given a refill and started IOP on, 06/21/19. Zoe Scott reports feeling depressed and this is worsening over the last couple weeks. She reports a subjective depressed mood of 6/10 (10 being the worse) with limited interest in activities, social withdrawal, and passive suicidal thoughts. She reports having limited hope and sight for the future. She reports no problems sleeping except when her son wakes up in the  middle of the night. She reports lower appetite. She reports a subjective anxiety rating of 4/10 (10 being the worse) with worrying but not as much as before. She repots engaging with school and completing her assignments and she is almost done. She reports using cannabis more often over the weekend, up to three times a day. Lithium level for 419/21: 0.74Re-order urine tox and pregnancy screen ?Plan: Continue IOP Increase lithium 450mg  in the morning and 750mg  nightly  Continue seroquel 150mg  twice a day Continue hydroxyzine 25mg  every four hours as needed for anxiety Continue zolpidem 5mg  every night ?REVIEW OF ALLERGIES/MEDICATIONS/HISTORY I have reviewed the patient's current medications and allergiesI have reviewed with the patient, the risks and benefits of the medications prescribed.OBJECTIVE Review of SystemsReview of Systems Psychiatric/Behavioral: Positive for dysphoric mood. The patient is nervous/anxious.  All other systems reviewed and are negative. LabsNo new labsCurrent MedicationsCurrent Outpatient Medications Medication Sig ? hydrOXYzine (ATARAX) 25 mg tablet Take 1 tablet (25 mg total) by mouth every 4 (four) hours as needed (Anxiety). ? lithium (ESKALITH) 450 mg CR extended release tablet Take 1 tablet (450 mg total) by mouth 2 (two) times daily with breakfast and dinner. ? QUEtiapine (SEROQUEL) 50 mg Immediate Release tablet Take 3 tablets (150 mg total) by mouth 2 (two) times daily. ? zolpidem (AMBIEN) 5 mg tablet Take 1 tablet (5 mg total) by mouth nightly. No current facility-administered medications for this encounter.  Mental Status ExamGeneral AppearanceHabitus:  MediumGrooming:  GoodMusculoskeletalStrength and Tone: Strength normalGait and Station: Stable gait and stable posturePsychiatricAttitude: Cooperative, good eye contact and pleasantPsychomotor Behavior: No psychomotor activation or retardationSpeech: normal rate, volume and prosodyMood: sad, anxious   Patient reported mood:  I feel goodAffect: Congruent to reported moodThought Process: Coherent, logical, goal directedAssociations: NormalThought Content: Normal no auditory hallucinations, no command auditory hallucinations, no paranoid delusions, not perseverativeSuicidal Ideation: No current suicidal plan, ideation or intentHomicidal Ideation: No current homicidal ideation, plan or intentJudgment:  GoodInsight:  GoodCognitive EvaluationOrientation: Oriented to person, oriented to place and oriented to date/time Attention and Concentration:  Normal attention and concentrationMemory: Recent and remote memory intactLanguage:  Language intactFund of Knowledge: average.Abstract Reasoning: Normal capacity for abstract reasoningSafety and Risk AssessmentPSY RISK ASSESSMENT SAFE-T WITH C-SSRS C-SSRS: Suicidal Ideation:  Since Last Assessment  WISH TO BE DEAD:  Yes  CURRENT SUICIDAL THOUGHTS:  No  SUICIDAL THOUGHTS WITH METHOD:  No  SUICIDAL INTENT WITHOUT SPECIFIC PLAN:  No  INTENT WITH PLAN:  No  Have you ever done anything, started to do anything or prepared to do anything to end your life?:  Yes  Was it within the past 3 months?:  NoSuicidal Ideation Intensity :   FREQUENCY:  Once a week  DURATION:  Fleeting - few seconds or minutes  CONTROLLABILITY:  Easily able to control thoughts  DETERRENTS:  Deterrents definitely stopped you from attempting suicide  REASONS FOR IDEATION:  Does not applyRisk Assessment:   Access to Lethal Methods:  NoCurrent and Past Psychiatric Diagnoses:    Mood Disorder:  Recurrent/Current  Psychotic Disorder:  No  Alcohol/Substance Abuse Disorders:  Recurrent/Current  PTSD:  No  ADHD:  No  TBI:  No  Cluster B Personality Disorders or Traits:  No  Conduct Problems:  No  Suicide Attempt:  No Prior Attempts  Presenting Symptoms:  Anhedonia:  Yes  Impulsivity:  No  Hopelessness or Despair:  Yes  Anxiety and/or Panic:  No  Insomnia:  No  Command Hallucinations:  No  Psychosis:  No  Self-injurious Behavior:  No  Physical Aggression Toward Others:  No  Violence, Victimization and/or Perpetration:  No  Family History:   Suicide:  No  Suicidal Behavior:  No  Axis I Psychiatric Diagnosis requiring hospitalization:  No  Precipitants / Stressors:   Triggering events leading to humiliation, shame and/or despair:  No  Chronic physical pain or other acute medical problem:  No  Sexual / Physical Abuse:  No  Substance Intoxication or Withdrawal:  No  Pending Incarceration:  No  Legal Problems:  No  Inadequate Social Supports:  No  Social Isolation:  No  Perceived burden on others:  No  Bullying / Cyberbullying:  No  Media portrayal of suicide:  No  Housing Insecurity:  No  Financial Insecurity:  No  Stressful Life Events:  Yes  Gender / Sexual Identity:  No  Homelessness:  No  Change in Treatment:    Recent inpatient discharge:  No  Change in provider or treatment:  No  Hopeless or dissatisfied with provider or treatment:  No  Non-compliant:  Yes  Not receiving treatment:  NoCurrent and Past Psychiatric Diagnoses:  Mood Disorder:  Recurrent/Current  Psychotic Disorder:  No  Alcohol/Substance Abuse Disorders:  Recurrent/Current  PTSD:  No  ADHD:  No  TBI:  No  Cluster B Personality Disorders or Traits:  No  Conduct Problems:  No  Suicide Attempt:  No Prior AttemptsProtective Factors:   Internal:   Ability to cope with stress:  Yes  Frustration tolerance:  Yes  Fear of death or the actual act of killing self:  Yes  Identifies reasons for living:  Yes  Problem solving skills:  Yes  External:   Responsibility to children:  Yes  Beloved pets:  Yes  Supportive social network of friends or family:  Yes  Positive therapeutic relationships / Effective mental healthcare:  YesRisk to Self - Self-Injurious Behavior:   Current Urges to harm Self:  Yes  Recent Self-Injury:  No  History of Self Injury:  No  Imminent Risk for Self Injury in Community:  Low  Imminent Risk for Self Injury in Facility:  LowRisk to Others:   Current Agitation:  No  Homicidal/Aggressive Ideation:  No  Homicidal/Aggressive Threat/Plan:  No  Recent Violence/Aggression:  NoWithdrawal Risk:   Alcohol / Benzodiazepines / Barbiturates:  Not applicable     Alcohol/Benzodiazepine/Barbiturate Risk for Withdrawal:  Absent  Opioids:  Not applicable     Opioid Risk for Withdrawal:  AbsentMEDICAL DECISION MAKING DiagnosisProblem List         ICD-10-CM   OP/IOP/PHP Psychiatry  MDD (major depressive disorder), recurrent severe, 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 Taleah reports feeling more depressed. Lithium level at 0.74. Plan to increase by 300mg  nightly for a total of 750mg  nightly.  Will continue with IOP at this time. Currently, no imminent safety concerns. The Self-Report [place] Risk Suicide Severity Rating Scale Since Last Visit was completed by Zoe Scott and reviewed by myself today.  Additionally, risk assessment from [facility] intake assessment and follow up screenings have been reviewed.  Based on  Zoe Scott?s clinical presentation today as well as her self-reported symptom ratings today and over the current course of treatment, I have assessed Zoe Scott to be at unchanged risk of harm to self as compared with prior recent assessments.  Based on this assessment, I have continued current treatment plan including implementation of factors to mitigate risks of harm to self as outlined in the last risk assessmentIntervention(s)Supportive therapy Labs/Test/Consults OrderedUrine tox Pregnancy screen Medication ChangesIncrease lithium 450mg  in AM and 750mg  at night PlanIncrease lithium 450mg  in AM and 750mg  at night Continue seroquel 150mg  twice a day Continue hydroxyzine 25mg  every four hours as needed for anxiety Continue zolpidem 5mg  every night as needed for sleep Continue IOP Discussed side effects, pros & cons of treatment, and alternative treatment options. Reviewed safety plan to call the clinic, the National Suicide Prevention Lifeline: 678-455-5430, or  911 to go to the ER if safety is at risk. Not deemed at imminent risk of harm to self/others at this timePatient verbalized understanding and had no additional questions. Electronically Signed:Aleksi Brummet, NP 06/28/2019 12:36 PM
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
## 2019-06-29 ENCOUNTER — Encounter: Admit: 2019-06-29 | Payer: PRIVATE HEALTH INSURANCE | Attending: Clinical | Primary: Cardiovascular Disease

## 2019-06-29 NOTE — Care Coordination-Inpatient
Due to heightened census in general mental health track, on Monday (06/28/19) patient joined the co-occurring track.  This Clinical research associate contacted patient by phone.  Patient was asked about her response to being in group sessions with Co occurring Track.  Patient reported that it went well and reported no concerns about engaging in groups with this track.  This Clinical research associate asked patient if she would be willing to attend Co occurring groups tomorrow and return to general mental health track on Thursday (07/01/19).  Patient was agreeable.  Patient was sent zoom meeting I D's and pass codes via Mychart message. Patient was informed during this telephone conversation that this writer has made attempts to contact her DCF worker [name]. Patient reports she has not spoken to [place] since she was discharged from the hospital. This writer spoke with patient about received child care assistance through [facility]. Patient states Geralyn Flash told her to call 2-1-1. She went on to say that Geralyn Flash has been working on patient's housing application for a year. This Clinical research associate explained to patient that this Clinical research associate would like to coordinate care with DCF but will need written consent from DCF. Patient states she will let Geralyn Flash know when she hears from her next. This Clinical research associate asked patient if she is providing verbal consent at this time to discuss her care with DCF and specifically with [name]. Patient states, Yes, you have permission. Danean Marner Cristino, LCSW4/20/20215:12 PM

## 2019-06-29 NOTE — Other
REACH Adult Multidisciplinary Team Clinical Rounds Meeting NoteMeeting Date: 06/28/19 STAFF PRESENT: Huntley Estelle, MD - Psychiatrist, Medical DirectorKatherine Bajda, APRN - Nurse PractitionerJessica Tyqwan Pink, APRN - Nurse PractitionerJennifer Naughton, LCSW - Clinical CoordinatorDavid Manson Passey, LCSW - Social WorkKaren Sandoval, LCSW - Social WorkNatalie [place], [place] - Social Celene Skeen, RN, BSN - Registered [place], LCSW - Clinical Reimbursement SpecialistPatient is a 24 year old single female diagnosed with MDD, GAD and Cannabis abuse. Attendance: Patient attended her 1st IOP treatment day in the REACH Program via Zoom video on 06/21/19. She has attended 3 IOP treatment days and attendance has been consistent.    Progress towards goals:    Patient shared the following goals for herself: Be more stable and Be a better [place]. Patient was asked to elaborate on these goals.  Patient elaborated as follows:Current data:  Patient is able to maintain some routine in her day.  She reports ?right now I have to force myself to do that. She states she is able to me her and her son's basic needs and some household tasks.Goal:  Patient identifies keeping ?routine with my son? including ?feed my son, care for him, take him to the park, go to school, groceries, laundry. Patient identifies she will have more motivation to engage in activities each day.  Patient reports at baseline, ?I feel very motivated.? and ?I am capable of anything. Patient also identifies experiencing decreased anxiety and depression would be a goal.  Patient states she would expect anxiety and depression to be reduced but not eliminated.  Patient reports at baseline, ?never a point when I experience 0 anxiety and depression, it is always there. She would like the intensity and frequency of the symptoms to be reduced.Patient scored her symptoms in daily check-in group with a rating scale of 0=none and 10 = highest.  On 06/24/19 Patient rated symptoms as follows:  Depression = 6; Irritability = 5; Rumination = 6, Racing Thoughts = 5; Anxiety = 5; Sleep Problems = 0; Self-Injury = 0; Hallucinations = 0; Suicidal Thoughts = 0; Homicidal Thoughts = 0.  Patient reported last week that she is not sure if she will continue to attend IOP treatment as she does not have childcare while she is in IOP groups, 3 days a week 3 hours each day.  She stated she is trying to find an individual therapist and medication management  [place]. Patient reports taking medications as prescribed and had no medication concerns. Medications prescribed as follows:  lithium 450mg  twice a day, seroquel 150mg  twice a day, hydroxyzine 25mg  every four hours as needed for anxiety, zolpidem 5mg  every night. Patient had Lithium level drawn this morning. Lithium level 0.74 today.  Substance Use:  Patient reports marijuana use once a day, only at night, a couple of puffs at nighttime to get me to sleep.  Patient reports using $60 of marijuana per week.  Patient reports marijuana makes me calm. It will help me evaluate a situation I've done or going to be in.  Patient is receptive to IOP treatment and agrees to try decreasing use while in IOP. Treatment team discussed intermittent urine toxicology screening as needed. New Treatment Goals: N/A  Plan:  Patient admitted to IOP level of care on 06/21/19 General Mental Health Treatment Track  3 days  per week for further assessment, evaluation, medication management and group therapy to stabilize mood and to support and monitor patient's progress. In therapeutic group environment, patient provided with opportunity to acquire specific support, knowledge, skills, strategies  and techniques to address presenting concerns. Individual and family therapy offered as needed. In light of COVID-19 precautions and to safely continue to provide treatment, IOP services are being provided via Zoom video and MyChart video sessions. In-person IOP and/or individual sessions will be contingent upon COVID-19 regulations and the [facility] ambulatory psychiatry operating procedures.   Co-construction of discharge plan ongoing. Patient to meet with Ervin Knack, APRN  or other REACH Licensed Independent Practitioner by 06/28/19 to discuss diagnostic impressions and treatment recommendations. Medications as of 06/29/19:?lithium 450mg  in the morning and 750mg  nightly  seroquel 150mg  twice a day hydroxyzine 25mg  every four hours as needed for anxiety zolpidem 5mg  every night ?Marland Kitchen Tentative d/c date is 4 to 8 weeks from admission to IOP level of care.    Treatment Team Representation:Margarita Maryelizabeth Kaufmann, MD - Psychiatrist, Medical DirectorKatherine Bajda, APRN - Psychiatric Nurse PractitionerJessica Fleur Audino, APRN - Psychiatric Nurse PractitionerJennifer Naughton, LCSW - Clinical CoordinatorNatalie Cristino, LCSW - Social Rema Jasmine, LCSW - Social WorkerKaren Hunter, LCSW - Social WorkerDorleen [place], Charity fundraiser - Assistant Nurse Betsey Holiday, RN, BSN - Registered NurseNicholas [place], BS, PCTNatalie Cristino, LCSW4/19/20215:46 PM

## 2019-06-29 NOTE — Care Coordination-Inpatient
This Clinical research associate called patient's DCF social worker [name] at [phone] but she did not answer and her voice mail box is full so a message could not be left. This Clinical research associate has attempted to contact patient's DCF social worker [name] 7 times since last week. This Clinical research associate called [place] DCF office. [phone]. The [facility] office message states that the office is currently closed until further notice. In an attempt to contact Francia's supervisor, this Clinical research associate called the DCF Careline which directed this Clinical research associate to call 2-1-1. This Clinical research associate looked up a contact phone number for [name] with DCF and obtained phone number [phone],  /tgus writer called the contact [facility] phone number listed for [name] of [phone]. This phone number was a DCF dispatch phone number. The operator was able to look up [name] and provided her supervisor's name as Carlynn Herald and phone number [phone]. At this time, this Clinical research associate has called this phone number 5 times over the last hour and the phone number is a busy signal. Yvetta Coder, LCSW4/20/20213:23 PM

## 2019-06-30 ENCOUNTER — Ambulatory Visit: Admit: 2019-06-30 | Payer: PRIVATE HEALTH INSURANCE | Primary: Cardiovascular Disease

## 2019-06-30 ENCOUNTER — Inpatient Hospital Stay: Admit: 2019-06-30 | Discharge: 2019-06-30 | Payer: MEDICAID | Primary: Cardiovascular Disease

## 2019-06-30 DIAGNOSIS — F1210 Cannabis abuse, uncomplicated: Secondary | ICD-10-CM

## 2019-06-30 DIAGNOSIS — F332 Major depressive disorder, recurrent severe without psychotic features: Secondary | ICD-10-CM

## 2019-06-30 DIAGNOSIS — F3189 Other bipolar disorder: Secondary | ICD-10-CM

## 2019-06-30 DIAGNOSIS — R5383 Other fatigue: Secondary | ICD-10-CM

## 2019-06-30 NOTE — Group Note
Group Session 3:  Group TherapyGroup Start Time:   12:10 PM      End Time:   1:00 PMFacilitators:  Lilyona Richner, LCSWVIDEO TELEHEALTH VISIT, PLATFORM OTHER THAN MYCHART: This clinician is conducting this visit at a site within our enrolled clinical location..  For this visit the clinician and patient were present via interactive audio & video telecommunications system that permits real-time communications, via a platform other than MyChart.Platforms other than MyChart may be used only during the COVID-19 crisis, when MyChart is not an option, and video is required for clinical reasons. Reason for choice of video platform other than MyChart: Currently [facility] has identified Zoom as the appropriate telehealth platform to facilitate IOP Group Therapy.PLEASE NOTE: CONSENT FOR THIS VISIT IS DIFFERENT THAN FOR MYCHART VIDEO VISITS! Verbal consent must be obtained by the clinician or staff prior to starting visit.  Read the Following Statements to Video Visit Participants:  ?(Name of clinician) will be conducting your video visit today. Before we begin, we must obtain your consent for today's visit.Can you confirm your name, date of birth, and state where you (or your child, if applicable) are currently located?By consenting, you understand that today's visit will be conducted through video conferencing technology other than our usual secure service. While we do everything possible to ensure your privacy, we may not be able to guarantee the security of this particular platform. You understand that the visit will not be recorded, and that we will be documenting this visit in our electronic medical record, and that we may disclose records concerning this interaction to other clinicians. You understand that a video visit may limit our ability to diagnose your condition.  You understand that if you are experiencing a medical emergency, you will be directed to dial 9-1-1 as we are not able to connect you directly. You also understand if your condition changes after this visit and you need of further care, you will contact our office. Do you agree to this consent, knowing that you can change your decision at any time? Do you have any other questions before we proceed??Patient consent given for video visit: yesState patient is located in: [place]: at patient's home addressThe clinician is appropriately licensed in the above state to provide care for this visit.Other individuals present during the telehealth encounter and their role/relation: Identified group facilitator(s), support staff as needed, and fellow group participants.Session Focus Connection between feelings, thoughts and behaviorsTreatment goal and skill use Number of Participants 6 Treatment Modality Group Therapy and Psycho-education Interventions/Education Assisted patient addressing stressful life situationsEncouraged increased cooperation with othersEncouraged increased initiation in groupsHelped decrease isolation/increase sense of belongingHelped identify feelings, thoughts and behaviors connectionHelped increase awareness of feelings, thoughts and behaviorsHelped patient acquire skills to build self-confidenceHelped patient identify strengthsHelped patient increase ability to gain support from othersHelped patient increase comfort with othersHelped patient increase interpersonal effectivenessHelped patient increase sense of acceptance by othersProvided assistance problem-solving challengesProvided positive reinforcement for successesProvided reinforcement for patient contributions to othersProvided reinforcement for use of adaptive coping skills Attendance            AttendedEngagement InterventionsNot applicable - patient attended group without difficulty Degree of Participation Active Behavior Cooperative and Oriented Speech/Thought Process Coherent and Focused Affect/Mood Euthymic and Stable Insight Moderate Judgment Moderate Response to Interventions Attentive, Engaged and Interested Individualization In today?s third group the [place]renity Prayer was read and group participants were asked to reflect and writer down a list of what they can change and a list of what they cannot change  and share. Patient shared that she CAN change the people she spends time with.  She said she is trying to interact with people who are stable and honest.  She said she CAN'T help how people relate to her.  Patient shared how she has been on her own for a long time and moved to [place] for a job opportunity that did not prove worth it.  She says she became pregnant and had to return to [place] because she has no family and she couldn't support herself.  Patient also shared that she would like to get a job so she can become independent but is limited in the work she can get that would allow her to pay for daycare.  Writer and peers encouraged her to be patient and wait for an opportunity that will be worthwhile.Patient did not express any suicidal or homicidal thoughts, plan or intent during this group. Plan Continue to assist patient with skills to improve functionContinue to assist patient with skills to manage symptomsContinue to reinforce use of positive coping skillsContinue to review and assess symptoms / issuesContinue to review and assess treatment goals / skill useContinue per Plan of Care / Interdisciplinary Plan of Care Jadien Lehigh, LCSW4/21/20214:58 PM

## 2019-06-30 NOTE — Other
Group Session: ?Group Therapy?Group Start Time:???10:00 AM??????End Time:??11:00 AMFacilitators:??Cindie Crumbly, LCSW?VIDEO TELEHEALTH VISIT, PLATFORM OTHER THAN MYCHART: This clinician is conducting this visit at a site within our enrolled clinical location.Marland Kitchen ?For this visit the clinician and patient were present via interactive audio & video telecommunications system that permits real-time communications, via a platform other than MyChart.?Platforms other than MyChart may be used only during the COVID-19 crisis, when MyChart is not an option, and video is required for clinical reasons. ?Reason for choice of video platform other than MyChart: Currently [facility] has identified Zoom as the appropriate telehealth platform to facilitate IOP Group Therapy.?PLEASE NOTE: CONSENT FOR THIS VISIT IS DIFFERENT THAN FOR MYCHART VIDEO VISITS! ?Verbal consent must be obtained by the clinician or staff prior to starting visit.??Read the Following Statements to Video Visit Participants:????(Name of clinician) will be conducting your video visit today. Before we begin, we must obtain your consent for today's visit.Can you confirm your name, date of birth, and state where you (or your child, if applicable) are currently located?By consenting, you understand that today's visit will be conducted through video conferencing technology other than our usual secure service. While we do everything possible to ensure your privacy, we may not be able to guarantee the security of this particular platform. You understand that the visit will not be recorded, and that we will be documenting this visit in our electronic medical record, and that we may disclose records concerning this interaction to other clinicians. You understand that a video visit may limit our ability to diagnose your condition.??You understand that if you are experiencing a medical emergency, you will be directed to dial 9-1-1 as we are not able to connect you directly. You also understand if your condition changes after this visit and you need of further care, you will contact our office. Do you agree to this consent, knowing that you can change your decision at any time? Do you have any other questions before we proceed???Patient consent given for video visit: yes?State patient is located in: [place] At home LocationThe clinician is appropriately licensed in the above state to provide care for this visit.??Session Focus? Connection between feelings, thoughts and behaviorsDevelop skills to manage issues and improve functioningStatus of symptoms / issuesTreatment goal and skill useDaily goal setting Number of Participants? 4 Treatment Modality? Group Therapy Interventions/Education? Assisted patient addressing stressful life situationsAssisted patient with discharge planningAssisted patient with strategies for addressing peer issuesAssisted patient with strategies for managing illness/symptomsAssisted patient with terminationEncouraged increased cooperation with othersEncouraged increased initiation in groupsEncouraged patient to increase treatment focusEncouraged patient to verbalize, rather than enactEncourages patient to provide constructive feedback to othersAssisted patient with strategies for addressing family issuesAssisted patient with strategies for addressing school issuesEncouraged acceptance, tolerance and respect for othersEncouraged appropriate expression of thoughts and feelingsEncouraged improved listening skillsEncouraged patient to request as-needed help solving problemsHelped decrease isolation/increase sense of belongingHelped identify feelings, thoughts and behaviors connectionHelped increase awareness of feelings, thoughts and behaviorsHelped patient acquire skills to build self-confidenceHelped patient identify strengthsHelped patient increase ability to gain support from othersHelped patient increase comfort with othersHelped patient increase interpersonal effectivenessHelped patient increase sense of acceptance by othersProvided assistance problem-solving challengesProvided positive reinforcement for successesProvided reinforcement for patient contributions to othersProvided reinforcement for use of adaptive coping skills ?Attendance???????????? Attended?Engagement InterventionsNot applicable - patient attended group without difficulty Degree of Participation? Active Behavior Alert, Cooperative, Guarded and Interactive Speech/Thought Process Coherent, Directed and Relevant Affect/Mood Anxious, Depressed and Irritable Insight Fair Judgment Moderate Response to Interventions? Attentive, Engaged, Interested and  Receptive Individualization? ?SUMMARY NOTE Group 2:During IOP Group Therapy via ZOOM Patients were presented the stages of change.  Patients were encouraged to discuss what stage of change they are in and how to work towards the next healthy stage.  Specifically this patient reported ?preparation I know I have a problem I turn to weed to deal with my depression and I know it is a problem attempt to [facility] numb out my feelings and do this often, I need something else to replace it?.  Patient reports it is hard to be happy she puts her son ahead of herself and that 90% her friends are negative and bad influences. Patient reports currently to not be experiencing any ideation, intent, or plans in reference to suicidal or homicidal symptomatology. Plan? Continue to assist patient with skills to improve functionContinue to assist patient with skills to manage symptomsContinue to reinforce use of positive coping skillsContinue to review and assess symptoms / issuesContinue to review and assess treatment goals / skill useContinue per Plan of Care / Interdisciplinary Plan of Care ?? [place] - Suicide Severity Screen? ? Most Recent Value Have you wished you were dead or wished you could go to sleep and not wake up? ?Yes Have you actually had any thoughts of killing yourself?  ?No Have you been thinking about how you might kill yourself?  ?No Have you had these thoughts and had some intention of acting on them? ?No Have you started to work out or worked out the details of how to kill yourself?  ?No Have you ever done anything, started to do anything, or prepared to do anything to end your life?  ?No Was this in the past 3 months? ?No [place] Suicide Risk Level ?Low Risk ??Cindie Crumbly, LCSW4/21/20215:46 PM

## 2019-06-30 NOTE — Progress Notes
Clinical team Ervin Knack APRN, Clinical Coordinator Renea Ee, direct clinician Natalie Christino LCSW, Cindie Crumbly, LCSW to discuss patient's request to transfer tracks.Based on information presented in this meeting by all providers patient will be transferring to the Co occurring disorder track as of today.  Patient reports feeling more accepted and relatable in this track, has an increase used in marijuana and wishes to quit but needs supports. Patient agrees to complete toxicology test to time per week at [facility], and specifically decrease marijuana use towards abstinence as the goal.  Patient reports specifically her goal is to ?have a negative test before leaving. Cindie Crumbly, LCSW4/21/20214:24 PM

## 2019-06-30 NOTE — Telephone Encounter
Patient called front reception staff reported that  last minute if she was asked to go to the pharmacy to get her grandmother's medication.  Patient reported she would be about 10 minutes late to IOP treatment.  Patient did not appear to enter the virtual rating room until about 40 minutes after the 1st group started and was unable to attend until a 2nd group per policy procedure.Cindie Crumbly, LCSW4/21/20215:45 PM

## 2019-06-30 NOTE — Telephone Encounter
In discussing patient's attendance in groups today patient was provided choice of her previous group with the Co occurring disorder group and patient requested to attend the Co occurring disorder group as well as to transfer to this track permanently.  Clinician spoke with patient via telephone after group to further assess.  Patient reports to smoke 100 dollars a week in cannabis for blunts a day of high quality cannabis.  Patient reports this is an increased amount from when she 1st initiated and reported in IOP treatment.  Patient reports she started at 24 years of age but started smoking daily from 22 to 44 years of age.  She reported she stop smoking for 1 year due to the birth of her child.  Patient reports that 90 % of her friends are a negative influence that she has drug cravings that are part of her routine to sleep.  Patient reports she smokes why her son is asleep and prior to bed.  Patient reports that hearing other people's stories and other people struggles with this same issue is helpful to her and makes her grateful that she is not have a worse issue in reference to substance use but feels this is motivating in working on her substance abuse issue.  Clinician called patient has a follow-up after clinical treatment team meeting.  Please see progress note. Clinician spoke very thoroughly about the program requirements and patient agreed to taking to tox screens per week, being open and honest in the group setting, decreasing significantly her marijuana use towards abstinence, and using supports provided as well as increasing her coping skills.  Patient's treatment plan will be amended as it is now identifying cannabis use as an issue.  Clinician spoke concretely about patient's marijuana use pattern and how she could start to decrease to 3 blunts per day and replace with a bath/ shower ritual to take a part of her evening.  Patient reports that she is motivated ?but scared to start.  Clinician provided ego support and discussed internal resources as well as resilience reference to patient dealing with this substance use.  Clinician also incorporated how patient could look differently and identify negative connotation to cannabis for herself and her family.  Patient appears to have therapeutic by in and feels this writer and track will be of assistance to her.Cindie Crumbly, LCSW4/21/20214:32 PM

## 2019-07-01 ENCOUNTER — Encounter: Admit: 2019-07-01 | Payer: PRIVATE HEALTH INSURANCE | Attending: Clinical | Primary: Cardiovascular Disease

## 2019-07-01 ENCOUNTER — Inpatient Hospital Stay: Admit: 2019-07-01 | Discharge: 2019-07-01 | Payer: MEDICAID | Primary: Cardiovascular Disease

## 2019-07-01 ENCOUNTER — Ambulatory Visit: Admit: 2019-07-01 | Payer: PRIVATE HEALTH INSURANCE | Primary: Cardiovascular Disease

## 2019-07-01 DIAGNOSIS — F3189 Other bipolar disorder: Secondary | ICD-10-CM

## 2019-07-01 DIAGNOSIS — F332 Major depressive disorder, recurrent severe without psychotic features: Secondary | ICD-10-CM

## 2019-07-01 DIAGNOSIS — F1210 Cannabis abuse, uncomplicated: Secondary | ICD-10-CM

## 2019-07-01 DIAGNOSIS — F411 Generalized anxiety disorder: Secondary | ICD-10-CM

## 2019-07-01 NOTE — Other
Group Session: ?Group Therapy?Group Start Time:???10:00 AM??????End Time:??11:00 AMFacilitators:??Cindie Crumbly, LCSW?VIDEO TELEHEALTH VISIT, PLATFORM OTHER THAN MYCHART: This clinician is conducting this visit at a site within our enrolled clinical location.Marland Kitchen ?For this visit the clinician and patient were present via interactive audio & video telecommunications system that permits real-time communications, via a platform other than MyChart.?Platforms other than MyChart may be used only during the COVID-19 crisis, when MyChart is not an option, and video is required for clinical reasons. ?Reason for choice of video platform other than MyChart: Currently [facility] has identified Zoom as the appropriate telehealth platform to facilitate IOP Group Therapy.?PLEASE NOTE: CONSENT FOR THIS VISIT IS DIFFERENT THAN FOR MYCHART VIDEO VISITS! ?Verbal consent must be obtained by the clinician or staff prior to starting visit.??Read the Following Statements to Video Visit Participants:????(Name of clinician) will be conducting your video visit today. Before we begin, we must obtain your consent for today's visit.Can you confirm your name, date of birth, and state where you (or your child, if applicable) are currently located?By consenting, you understand that today's visit will be conducted through video conferencing technology other than our usual secure service. While we do everything possible to ensure your privacy, we may not be able to guarantee the security of this particular platform. You understand that the visit will not be recorded, and that we will be documenting this visit in our electronic medical record, and that we may disclose records concerning this interaction to other clinicians. You understand that a video visit may limit our ability to diagnose your condition.??You understand that if you are experiencing a medical emergency, you will be directed to dial 9-1-1 as we are not able to connect you directly. You also understand if your condition changes after this visit and you need of further care, you will contact our office. Do you agree to this consent, knowing that you can change your decision at any time? Do you have any other questions before we proceed???Patient consent given for video visit: yes?State patient is located in: [place] At home?LocationThe clinician is appropriately licensed in the above state to provide care for this visit.??Session Focus? Connection between feelings, thoughts and behaviorsDevelop skills to manage issues and improve functioningStatus of symptoms / issuesTreatment goal and skill useDaily goal setting Number of Participants? 8 Treatment Modality? Group Therapy Interventions/Education? Assisted patient addressing stressful life situationsAssisted patient with discharge planningAssisted patient with strategies for addressing peer issuesAssisted patient with strategies for managing illness/symptomsAssisted patient with terminationEncouraged increased cooperation with othersEncouraged increased initiation in groupsEncouraged patient to increase treatment focusEncouraged patient to verbalize, rather than enactEncourages patient to provide constructive feedback to othersAssisted patient with strategies for addressing family issuesAssisted patient with strategies for addressing school issuesEncouraged acceptance, tolerance and respect for othersEncouraged appropriate expression of thoughts and feelingsEncouraged improved listening skillsEncouraged patient to request as-needed help solving problemsHelped decrease isolation/increase sense of belongingHelped identify feelings, thoughts and behaviors connectionHelped increase awareness of feelings, thoughts and behaviorsHelped patient acquire skills to build self-confidenceHelped patient identify strengthsHelped patient increase ability to gain support from othersHelped patient increase comfort with othersHelped patient increase interpersonal effectivenessHelped patient increase sense of acceptance by othersProvided assistance problem-solving challengesProvided positive reinforcement for successesProvided reinforcement for patient contributions to othersProvided reinforcement for use of adaptive coping skills ?Attendance???????????? Attended?Engagement InterventionsNot applicable - patient attended group without difficulty Degree of Participation? Active Behavior Alert, Cooperative, Guarded and Interactive Speech/Thought Process Coherent, Directed and Relevant Affect/Mood Anxious, Depressed and Irritable Insight Fair Judgment Moderate Response to Interventions? Attentive, Engaged, Interested and Receptive  Individualization? ?SUMMARY NOTE Group 1:During IOP Group Therapy via ZOOM Patient self-reported on a scale of 0 = None to 10= Highest; patient described their most concerning symptomatology as; depression 5, irritability 6, rumination 6, racing thoughts 7, anxiety 5, drug cravings 7 and 0 for all other assessed symptomatology.  Patient report was congruent with the patient's observed presentation.  Patient reports goal for the week as:  Partially achieved as she feels more motivated in treatment.  Patient identifies things that went well/grateful for; went to the gym I am going to be going a lot if I can find a babysitter.  Patient identified purposely working on the following skill; more activity, gym, finding distractions. Patient reports to be taking their medication as prescribed and reports no concerns noted at this time.  Patient reports she is trying to find more things to do reported that or the issue she is struggling is ?dealing with a toxic person in my life?.  Patient reports with feedback that she know she needs to completely and this relationship.  Patient reports currently to not be experiencing any ideation, intent, or plans in reference to suicidal or homicidal symptomatology.? Plan? Continue to assist patient with skills to improve functionContinue to assist patient with skills to manage symptomsContinue to reinforce use of positive coping skillsContinue to review and assess symptoms / issuesContinue to review and assess treatment goals / skill useContinue per Plan of Care / Interdisciplinary Plan of Care ??? ? [place] - Suicide Severity Screen? ? Most Recent Value Have you wished you were dead or wished you could go to sleep and not wake up? ?No Have you actually had any thoughts of killing yourself?? ?No Have you been thinking about how you might kill yourself?  ?No Have you had these thoughts and had some intention of acting on them? ?No Have you started to work out or worked out the details of how to kill yourself?  ?No Have you ever done anything, started to do anything, or prepared to do anything to end your life?  ?No Was this in the past 3 months? ?No [place] Suicide Risk Level ?Low Risk ??Cindie Crumbly, LCSW4/22/20215:45 PM

## 2019-07-01 NOTE — Group Note
Group Session 3:  Group TherapyGroup Start Time:   12:10 PM      End Time:   1:00 PMFacilitators:  Elnora Quizon, LCSWVIDEO TELEHEALTH VISIT, PLATFORM OTHER THAN MYCHART: This clinician is conducting this visit at a site within our enrolled clinical location..  For this visit the clinician and patient were present via interactive audio & video telecommunications system that permits real-time communications, via a platform other than MyChart.Platforms other than MyChart may be used only during the COVID-19 crisis, when MyChart is not an option, and video is required for clinical reasons. Reason for choice of video platform other than MyChart: Currently [facility] has identified Zoom as the appropriate telehealth platform to facilitate IOP Group Therapy.PLEASE NOTE: CONSENT FOR THIS VISIT IS DIFFERENT THAN FOR MYCHART VIDEO VISITS! Verbal consent must be obtained by the clinician or staff prior to starting visit.  Read the Following Statements to Video Visit Participants:  ?(Name of clinician) will be conducting your video visit today. Before we begin, we must obtain your consent for today's visit.Can you confirm your name, date of birth, and state where you (or your child, if applicable) are currently located?By consenting, you understand that today's visit will be conducted through video conferencing technology other than our usual secure service. While we do everything possible to ensure your privacy, we may not be able to guarantee the security of this particular platform. You understand that the visit will not be recorded, and that we will be documenting this visit in our electronic medical record, and that we may disclose records concerning this interaction to other clinicians. You understand that a video visit may limit our ability to diagnose your condition.  You understand that if you are experiencing a medical emergency, you will be directed to dial 9-1-1 as we are not able to connect you directly. You also understand if your condition changes after this visit and you need of further care, you will contact our office. Do you agree to this consent, knowing that you can change your decision at any time? Do you have any other questions before we proceed??Patient consent given for video visit: yesState patient is located in: [place]: at patient's home addressThe clinician is appropriately licensed in the above state to provide care for this visit.Other individuals present during the telehealth encounter and their role/relation: Identified group facilitator(s), support staff as needed, and fellow group participants.Session Focus Connection between feelings, thoughts and behaviorsStatus of symptoms / issuesTreatment goal and skill use Number of Participants 5 Treatment Modality Group Therapy Interventions/Education Assisted patient addressing stressful life situationsAssisted patient with strategies for managing illness/symptomsEncouraged patient to increase treatment focusAssisted patient with strategies for addressing family issuesHelped decrease isolation/increase sense of belongingHelped identify feelings, thoughts and behaviors connectionHelped increase awareness of feelings, thoughts and behaviorsHelped patient acquire skills to build self-confidenceHelped patient identify strengthsHelped patient increase ability to gain support from othersHelped patient increase comfort with othersHelped patient increase interpersonal effectivenessHelped patient increase sense of acceptance by othersProvided assistance problem-solving challengesProvided positive reinforcement for successesProvided reinforcement for patient contributions to othersProvided reinforcement for use of adaptive coping skills Attendance            AttendedEngagement InterventionsNot applicable - patient attended group without difficulty Degree of Participation Moderate Behavior Alert, Calm, Oriented and Shy Speech/Thought Process Coherent, Focused and Relevant Affect/Mood Anxious, Euthymic and Stable Insight Limited Judgment Limited Response to Interventions Engaged and Interested Individualization Today?s third group focused on weekend planning.  Patient was quiet and patient while others shared.  Patient shared  that her mother is coming tomorrow to take her son in the afternoon so she can get stuff done.  She seemed anxious when detailing how she wanted to do laundry, grocery shop, clean and study all in time her mother would be caring for her son. She said the rest of the weekend she'd spent with her mother and mother's friends.  When asked if patient planned on using marijuana this weekend she said she'd try not to. She said she wouldn't be tempted when socializing as (she) never smokes when (I'm) socializing.  She said she'll be tempted if she starts feeling anxious about all the things she wants to accomplish.Patient did not express any suicidal or homicidal thoughts, plan or intent during this group. Plan Continue to assist patient with skills to improve functionContinue to assist patient with skills to manage symptomsContinue to reinforce use of positive coping skillsContinue to review and assess symptoms / issuesContinue to review and assess treatment goals / skill useContinue per Plan of Care / Interdisciplinary Plan of Care Zoe Scott, LCSW4/22/20215:22 PM

## 2019-07-01 NOTE — Other
Group Session: ?Group Therapy?Group Start Time:???11:10 AM??????End Time:??12:00 PMFacilitators:??Cindie Crumbly, LCSW?VIDEO TELEHEALTH VISIT, PLATFORM OTHER THAN MYCHART: This clinician is conducting this visit at a site within our enrolled clinical location.Marland Kitchen ?For this visit the clinician and patient were present via interactive audio & video telecommunications system that permits real-time communications, via a platform other than MyChart.?Platforms other than MyChart may be used only during the COVID-19 crisis, when MyChart is not an option, and video is required for clinical reasons. ?Reason for choice of video platform other than MyChart: Currently [facility] has identified Zoom as the appropriate telehealth platform to facilitate IOP Group Therapy.?PLEASE NOTE: CONSENT FOR THIS VISIT IS DIFFERENT THAN FOR MYCHART VIDEO VISITS! ?Verbal consent must be obtained by the clinician or staff prior to starting visit.??Read the Following Statements to Video Visit Participants:????(Name of clinician) will be conducting your video visit today. Before we begin, we must obtain your consent for today's visit.Can you confirm your name, date of birth, and state where you (or your child, if applicable) are currently located?By consenting, you understand that today's visit will be conducted through video conferencing technology other than our usual secure service. While we do everything possible to ensure your privacy, we may not be able to guarantee the security of this particular platform. You understand that the visit will not be recorded, and that we will be documenting this visit in our electronic medical record, and that we may disclose records concerning this interaction to other clinicians. You understand that a video visit may limit our ability to diagnose your condition.??You understand that if you are experiencing a medical emergency, you will be directed to dial 9-1-1 as we are not able to connect you directly. You also understand if your condition changes after this visit and you need of further care, you will contact our office. Do you agree to this consent, knowing that you can change your decision at any time? Do you have any other questions before we proceed???Patient consent given for video visit: yes?State patient is located in: [place] At home?LocationThe clinician is appropriately licensed in the above state to provide care for this visit.??Session Focus? Connection between feelings, thoughts and behaviorsDevelop skills to manage issues and improve functioningStatus of symptoms / issuesTreatment goal and skill useDaily goal setting Number of Participants? 8 Treatment Modality? Group Therapy Interventions/Education? Assisted patient addressing stressful life situationsAssisted patient with discharge planningAssisted patient with strategies for addressing peer issuesAssisted patient with strategies for managing illness/symptomsAssisted patient with terminationEncouraged increased cooperation with othersEncouraged increased initiation in groupsEncouraged patient to increase treatment focusEncouraged patient to verbalize, rather than enactEncourages patient to provide constructive feedback to othersAssisted patient with strategies for addressing family issuesAssisted patient with strategies for addressing school issuesEncouraged acceptance, tolerance and respect for othersEncouraged appropriate expression of thoughts and feelingsEncouraged improved listening skillsEncouraged patient to request as-needed help solving problemsHelped decrease isolation/increase sense of belongingHelped identify feelings, thoughts and behaviors connectionHelped increase awareness of feelings, thoughts and behaviorsHelped patient acquire skills to build self-confidenceHelped patient identify strengthsHelped patient increase ability to gain support from othersHelped patient increase comfort with othersHelped patient increase interpersonal effectivenessHelped patient increase sense of acceptance by othersProvided assistance problem-solving challengesProvided positive reinforcement for successesProvided reinforcement for patient contributions to othersProvided reinforcement for use of adaptive coping skills ?Attendance???????????? Attended?Engagement InterventionsNot applicable - patient attended group without difficulty Degree of Participation? Active Behavior Alert, Cooperative, Guarded and Interactive Speech/Thought Process Coherent, Directed and Relevant Affect/Mood Anxious, Depressed and Irritable Insight Fair Judgment Moderate Response to Interventions? Attentive, Engaged, Interested and Receptive  Individualization? SUMMARY NOTE Group 2:During IOP Group Therapy via ZOOM Patients presented handout on sleep hygiene encouraged to think of each aspect and how they could improve their overall sleep hygiene.  Clinician also highlighted why sleep is important, the benefits the proper amount and structured sleep schedule.  Specifically this patient reported ?change association with my bed it's hard because I do not have a lot of space?.  Patient also identifies needing a relaxation ritual prior to bed other than smoking marijuana.  Patient reports currently to not be experiencing any ideation, intent, or plans in reference to suicidal or homicidal symptomatology.? Plan? Continue to assist patient with skills to improve functionContinue to assist patient with skills to manage symptomsContinue to reinforce use of positive coping skillsContinue to review and assess symptoms / issuesContinue to review and assess treatment goals / skill useContinue per Plan of Care / Interdisciplinary Plan of Care ??Cindie Crumbly, LCSW4/22/20215:47 PM

## 2019-07-05 ENCOUNTER — Inpatient Hospital Stay: Admit: 2019-07-05 | Discharge: 2019-07-05 | Payer: MEDICAID | Primary: Cardiovascular Disease

## 2019-07-05 ENCOUNTER — Ambulatory Visit: Admit: 2019-07-05 | Payer: PRIVATE HEALTH INSURANCE | Primary: Cardiovascular Disease

## 2019-07-05 DIAGNOSIS — R5383 Other fatigue: Secondary | ICD-10-CM

## 2019-07-05 DIAGNOSIS — F332 Major depressive disorder, recurrent severe without psychotic features: Secondary | ICD-10-CM

## 2019-07-05 DIAGNOSIS — F3189 Other bipolar disorder: Secondary | ICD-10-CM

## 2019-07-05 DIAGNOSIS — F411 Generalized anxiety disorder: Secondary | ICD-10-CM

## 2019-07-05 DIAGNOSIS — F1210 Cannabis abuse, uncomplicated: Secondary | ICD-10-CM

## 2019-07-05 DIAGNOSIS — R251 Tremor, unspecified: Secondary | ICD-10-CM

## 2019-07-05 NOTE — Other
Group Session:  Group Therapy Group Start Time:   11:10 AM      End Time:  12:00 PMFacilitators:  Cindie Crumbly, LCSW; [place], [place], PCTVIDEO TELEHEALTH VISIT, PLATFORM OTHER THAN MYCHART: This clinician is conducting this visit at a site within our enrolled clinical location.Marland Kitchen ?For this visit the clinician and patient were present via interactive audio & video telecommunications system that permits real-time communications, via a platform other than MyChart.?Platforms other than MyChart may be used only during the COVID-19 crisis, when MyChart is not an option, and video is required for clinical reasons. ?Reason for choice of video platform other than MyChart: Currently [facility] has identified Zoom as the appropriate telehealth platform to facilitate IOP Group Therapy.?PLEASE NOTE: CONSENT FOR THIS VISIT IS DIFFERENT THAN FOR MYCHART VIDEO VISITS! ?Verbal consent must be obtained by the clinician or staff prior to starting visit.??Read the Following Statements to Video Visit Participants:????(Name of clinician) will be conducting your video visit today. Before we begin, we must obtain your consent for today's visit.Can you confirm your name, date of birth, and state where you (or your child, if applicable) are currently located?By consenting, you understand that today's visit will be conducted through video conferencing technology other than our usual secure service. While we do everything possible to ensure your privacy, we may not be able to guarantee the security of this particular platform. You understand that the visit will not be recorded, and that we will be documenting this visit in our electronic medical record, and that we may disclose records concerning this interaction to other clinicians. You understand that a video visit may limit our ability to diagnose your condition.??You understand that if you are experiencing a medical emergency, you will be directed to dial 9-1-1 as we are not able to connect you directly. You also understand if your condition changes after this visit and you need of further care, you will contact our office. Do you agree to this consent, knowing that you can change your decision at any time? Do you have any other questions before we proceed???Patient consent given for video visit: yes?State patient is located in: [place] At home?LocationThe clinician is appropriately licensed in the above state to provide care for this visit.??Session Focus? Connection between feelings, thoughts and behaviorsDevelop skills to manage issues and improve functioningStatus of symptoms / issuesTreatment goal and skill useDaily goal setting Number of Participants? 8 Treatment Modality? Group Therapy Interventions/Education? Assisted patient addressing stressful life situationsAssisted patient with discharge planningAssisted patient with strategies for addressing peer issuesAssisted patient with strategies for managing illness/symptomsAssisted patient with terminationEncouraged increased cooperation with othersEncouraged increased initiation in groupsEncouraged patient to increase treatment focusEncouraged patient to verbalize, rather than enactEncourages patient to provide constructive feedback to othersAssisted patient with strategies for addressing family issuesAssisted patient with strategies for addressing school issuesEncouraged acceptance, tolerance and respect for othersEncouraged appropriate expression of thoughts and feelingsEncouraged improved listening skillsEncouraged patient to request as-needed help solving problemsHelped decrease isolation/increase sense of belongingHelped identify feelings, thoughts and behaviors connectionHelped increase awareness of feelings, thoughts and behaviorsHelped patient acquire skills to build self-confidenceHelped patient identify strengthsHelped patient increase ability to gain support from othersHelped patient increase comfort with othersHelped patient increase interpersonal effectivenessHelped patient increase sense of acceptance by othersProvided assistance problem-solving challengesProvided positive reinforcement for successesProvided reinforcement for patient contributions to othersProvided reinforcement for use of adaptive coping skills ?Attendance???????????? Attended?Engagement InterventionsNot applicable - patient attended group without difficulty Degree of Participation? Active Behavior Alert, Cooperative, Guarded and Interactive Speech/Thought Process Coherent, Directed and  Relevant Affect/Mood Anxious, Depressed and Irritable Insight Fair Judgment Moderate Response to Interventions? Attentive, Engaged, Interested and Receptive Individualization? SUMMARY NOTE Group 2:During IOP Group Therapy via ZOOM Patient was presented a verbalized handout on self talk in motivation specifically highlighting thoughts that hinder and thoughts that may help.  Clinician specifically requested of patient's what thoughts they have that hinder and how they could be replaced with thoughts that are more helpful.  Specifically this patient reported thoughts that hinder ?ugly, not capable of doing anything right, waste of space?.  Patient reports that she reinforces these thoughts on her own but that her mother was miserable as a childhood also reinforce these things in a toxic relationship in her full words she used.  Patient had difficulty verbalizing replacement thoughts and feelings but was productive in the group reported that she understood how the concept is helpful to change the way she talks to herself.  Patient was also receptive to positive feedback from peers.  Patient reports currently to not be experiencing any ideation, intent, or plans in reference to suicidal or homicidal symptomatology.? Plan? Continue to assist patient with skills to improve functionContinue to assist patient with skills to manage symptomsContinue to reinforce use of positive coping skillsContinue to review and assess symptoms / issuesContinue to review and assess treatment goals / skill useContinue per Plan of Care / Interdisciplinary Plan of Care ??Cindie Crumbly, LCSW4/26/20215:50 PM

## 2019-07-05 NOTE — Other
Group Session: ?Group Therapy?Group Start Time:???10:00 AM??????End Time:??11:00 AMFacilitators:??Cindie Crumbly, LCSW?VIDEO TELEHEALTH VISIT, PLATFORM OTHER THAN MYCHART: This clinician is conducting this visit at a site within our enrolled clinical location.Marland Kitchen ?For this visit the clinician and patient were present via interactive audio & video telecommunications system that permits real-time communications, via a platform other than MyChart.?Platforms other than MyChart may be used only during the COVID-19 crisis, when MyChart is not an option, and video is required for clinical reasons. ?Reason for choice of video platform other than MyChart: Currently [facility] has identified Zoom as the appropriate telehealth platform to facilitate IOP Group Therapy.?PLEASE NOTE: CONSENT FOR THIS VISIT IS DIFFERENT THAN FOR MYCHART VIDEO VISITS! ?Verbal consent must be obtained by the clinician or staff prior to starting visit.??Read the Following Statements to Video Visit Participants:????(Name of clinician) will be conducting your video visit today. Before we begin, we must obtain your consent for today's visit.Can you confirm your name, date of birth, and state where you (or your child, if applicable) are currently located?By consenting, you understand that today's visit will be conducted through video conferencing technology other than our usual secure service. While we do everything possible to ensure your privacy, we may not be able to guarantee the security of this particular platform. You understand that the visit will not be recorded, and that we will be documenting this visit in our electronic medical record, and that we may disclose records concerning this interaction to other clinicians. You understand that a video visit may limit our ability to diagnose your condition.??You understand that if you are experiencing a medical emergency, you will be directed to dial 9-1-1 as we are not able to connect you directly. You also understand if your condition changes after this visit and you need of further care, you will contact our office. Do you agree to this consent, knowing that you can change your decision at any time? Do you have any other questions before we proceed???Patient consent given for video visit: yes?State patient is located in: [place] At home?LocationThe clinician is appropriately licensed in the above state to provide care for this visit.??Session Focus? Connection between feelings, thoughts and behaviorsDevelop skills to manage issues and improve functioningStatus of symptoms / issuesTreatment goal and skill useDaily goal setting Number of Participants? 8 Treatment Modality? Group Therapy Interventions/Education? Assisted patient addressing stressful life situationsAssisted patient with discharge planningAssisted patient with strategies for addressing peer issuesAssisted patient with strategies for managing illness/symptomsAssisted patient with terminationEncouraged increased cooperation with othersEncouraged increased initiation in groupsEncouraged patient to increase treatment focusEncouraged patient to verbalize, rather than enactEncourages patient to provide constructive feedback to othersAssisted patient with strategies for addressing family issuesAssisted patient with strategies for addressing school issuesEncouraged acceptance, tolerance and respect for othersEncouraged appropriate expression of thoughts and feelingsEncouraged improved listening skillsEncouraged patient to request as-needed help solving problemsHelped decrease isolation/increase sense of belongingHelped identify feelings, thoughts and behaviors connectionHelped increase awareness of feelings, thoughts and behaviorsHelped patient acquire skills to build self-confidenceHelped patient identify strengthsHelped patient increase ability to gain support from othersHelped patient increase comfort with othersHelped patient increase interpersonal effectivenessHelped patient increase sense of acceptance by othersProvided assistance problem-solving challengesProvided positive reinforcement for successesProvided reinforcement for patient contributions to othersProvided reinforcement for use of adaptive coping skills ?Attendance???????????? Attended?Engagement InterventionsNot applicable - patient attended group without difficulty Degree of Participation? Active Behavior Alert, Cooperative, Guarded and Interactive Speech/Thought Process Coherent, Directed and Relevant Affect/Mood Anxious, Depressed and Irritable Insight Fair Judgment Moderate Response to Interventions? Attentive, Engaged, Interested and Receptive  Individualization? ?SUMMARY NOTE Group 1:During IOP Group Therapy via ZOOM Patient self-reported on a scale of 0 = None to 10= Highest; patient described their most concerning symptomatology as; depression 5, irritability 5, rumination 5, racing thoughts 5, anxiety 4, sleep problems 4.5, drug cravings 6 and 0 for all other assessed symptomatology.  Patient report was congruent with the patient's observed presentation.  Patient reports goal for the week as:  Be more happy and get out of the house more.  Patient identifies things that went well/grateful for; my son, being healthy, new beginnings in new starts.  Patient identified purposely working on the following skill; outside more and ?find that the more I decrease marijuana the more motivated I become?. Patient reports to be taking their medication as prescribed and reports no concerns noted at this time. Patient reports currently to not be experiencing any ideation, intent, or plans in reference to suicidal or homicidal symptomatology.? Plan? Continue to assist patient with skills to improve functionContinue to assist patient with skills to manage symptomsContinue to reinforce use of positive coping skillsContinue to review and assess symptoms / issuesContinue to review and assess treatment goals / skill useContinue per Plan of Care / Interdisciplinary Plan of Care ??? ? [place] - Suicide Severity Screen? ? Most Recent Value Have you wished you were dead or wished you could go to sleep and not wake up? ?Yes Have you actually had any thoughts of killing yourself?? ?No Have you been thinking about how you might kill yourself?  ?No Have you had these thoughts and had some intention of acting on them? ?No Have you started to work out or worked out the details of how to kill yourself?  ?No Have you ever done anything, started to do anything, or prepared to do anything to end your life?  ?No Was this in the past 3 months? ?No [place] Suicide Risk Level ?Low Risk ??Cindie Crumbly, LCSW4/26/20215:49 PM

## 2019-07-05 NOTE — Group Note
Group Session:  Group TherapyGroup Start Time:   12:10 PM      End Time:   1:00 PMFacilitators:  Arvind Mexicano, Shanda Bumps, NP; [place], [place], PCTVIDEO TELEHEALTH VISIT, PLATFORM OTHER THAN MYCHART: This clinician is conducting this visit at a site within our enrolled clinical location..  For this visit the clinician and patient were present via interactive audio & video telecommunications system that permits real-time communications, via a platform other than MyChart.Platforms other than MyChart may be used only during the COVID-19 crisis, when MyChart is not an option, and video is required for clinical reasons. Reason for choice of video platform other than MyChart: Currently [facility] has identified Zoom as the appropriate telehealth platform to facilitate IOP Group Therapy.PLEASE NOTE: CONSENT FOR THIS VISIT IS DIFFERENT THAN FOR MYCHART VIDEO VISITS! Verbal consent must be obtained by the clinician or staff prior to starting visit.  Read the Following Statements to Video Visit Participants:  ?(Name of clinician) will be conducting your video visit today. Before we begin, we must obtain your consent for today's visit.Can you confirm your name, date of birth, and state where you (or your child, if applicable) are currently located?By consenting, you understand that today's visit will be conducted through video conferencing technology other than our usual secure service. While we do everything possible to ensure your privacy, we may not be able to guarantee the security of this particular platform. You understand that the visit will not be recorded, and that we will be documenting this visit in our electronic medical record, and that we may disclose records concerning this interaction to other clinicians. You understand that a video visit may limit our ability to diagnose your condition.  You understand that if you are experiencing a medical emergency, you will be directed to dial 9-1-1 as we are not able to connect you directly. You also understand if your condition changes after this visit and you need of further care, you will contact our office. Do you agree to this consent, knowing that you can change your decision at any time? Do you have any other questions before we proceed??Patient consent given for video visit: yesState patient is located in: [place] @ home address The clinician is appropriately licensed in the above state to provide care for this visit.Other individuals present during the telehealth encounter and their role/relation: Identified group facilitator(s), support staff as needed, and fellow group participants. Session Focus Connection between feelings, thoughts and behaviorsDevelop skills to manage issues and improve functioning Number of Participants 7 Treatment Modality Group Therapy Interventions/Education Helped identify feelings, thoughts and behaviors connectionHelped increase awareness of feelings, thoughts and behaviorsHelped patient increase ability to gain support from others Attendance            AttendedEngagement InterventionsNot applicable - patient attended group without difficulty Degree of Participation Moderate Behavior Appropriate and Cooperative Speech/Thought Process Coherent and Organized Affect/Mood Stable Insight Good Judgment Good Response to Interventions Attentive and Engaged Individualization Mazel participated in the anger regulation and need identification group. She actively engaged in the didactic session and then participated in the writing exercise to identify anger, other emotions, and needs. She was attentive to her peers throughout the session.   Plan Continue to assist patient with skills to improve functionContinue to assist patient with skills to manage symptoms

## 2019-07-07 ENCOUNTER — Inpatient Hospital Stay: Admit: 2019-07-07 | Discharge: 2019-07-07 | Payer: MEDICAID | Primary: Cardiovascular Disease

## 2019-07-07 ENCOUNTER — Ambulatory Visit: Admit: 2019-07-07 | Payer: PRIVATE HEALTH INSURANCE | Primary: Cardiovascular Disease

## 2019-07-07 DIAGNOSIS — F332 Major depressive disorder, recurrent severe without psychotic features: Secondary | ICD-10-CM

## 2019-07-07 DIAGNOSIS — F411 Generalized anxiety disorder: Secondary | ICD-10-CM

## 2019-07-07 DIAGNOSIS — F3189 Other bipolar disorder: Secondary | ICD-10-CM

## 2019-07-07 DIAGNOSIS — R5383 Other fatigue: Secondary | ICD-10-CM

## 2019-07-07 MED ORDER — QUETIAPINE IMMEDIATE RELEASE 100 MG TABLET
100 mg | ORAL_TABLET | 1 refills | Status: AC
Start: 2019-07-07 — End: 2019-07-12

## 2019-07-07 MED ORDER — CARIPRAZINE 1.5 MG CAPSULE
1.5 mg | ORAL_CAPSULE | Freq: Every day | ORAL | 1 refills | Status: AC
Start: 2019-07-07 — End: 2019-07-15

## 2019-07-07 MED ORDER — LITHIUM CARBONATE ER 300 MG TABLET,EXTENDED RELEASE
300 mg | ORAL_TABLET | Freq: Every evening | ORAL | 1 refills | Status: AC
Start: 2019-07-07 — End: 2019-07-12

## 2019-07-07 NOTE — Other
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  Cannabis abuse F12.10  Generalized anxiety disorder F41.1  R/O Bipolar DisorderLevel of Care/Treatment Track: [facility] IOP AdultFrequency: Three times a weekEstimated duration/length of stay in the program: 2 monthsTreatment Modalities: Pharmacological Management;Group Psychotherapy (individual and family therapy as needed.)Patient Strengths: expresses reasons to live/engage in productive activity;able to express feelings;able to define needs;able to engage others;able to live independently;good pre-morbid functioningPatient and/or Family Stated Short Term Personal Goal(s): Be more stable and Be a better Zoe Scott.Patient and/or Family Stated Long Term Personal Goal(s): ?Being stable.  Having my own shelter and having a career.Goal of Treatment #1: Patient will present with stable mood and improved level of functioningGoal of Treatment #2: Patient will be able to identify and implement effective coping strategiesGoal of Treatment #3: Monitor response and manage psychiatric medicationsThe patient did participate in the development of this Treatment Plan.Active Multidisciplinary Problems: INTERDISCIPLINARY PROBLEM LISTAnxiety:  ActiveDescriptive Behaviors:  Patient is a 24 year old single female diagnosed with GAD.Patient's current symptoms were reviewed along with discussion related to treatment objectives, goals and interventions. Patient shared the following goals for herself: Be more stable and Be a better Zoe Scott. Patient was asked to elaborate on these goals.  Patient elaborated as follows:Current data:  Patient is able to maintain some routine in her day.  She reports right now I have to force myself to do that. She states she is able to me her and her son's basic needs and some household tasks.Goal:  Patient identifies keeping routine with my son including feed my son, care for him, take him to the park, go to school, groceries, laundry. Patient identifies she will have more motivation to engage in activities each day.  Patient reports at baseline, I feel very motivated. and I am capable of anything. Patient also identifies experiencing decreased anxiety and depression would be a goal.  Patient states she would expect anxiety and depression to be reduced but not eliminated.  Patient reports at baseline, never a point when I experience 0 anxiety and depression, it is always there. She would like the intensity and frequency of the symptoms to be reduced.Patient scored her symptoms in daily check-in group with a rating scale of 0=none and 10 = highest.  On 06/24/19 Patient rated symptoms as follows:  Depression = 6; Irritability = 5; Rumination = 6, Racing Thoughts = 5; Anxiety = 5; Sleep Problems = 0; Self-Injury = 0; Hallucinations = 0; Suicidal Thoughts = 0; Homicidal Thoughts = 0.Patient identified her long-term goal as:  Being stable.  Having my own shelter and having a career. Patient Stated Goal(s):  : Be more stable and Be a better Zoe Scott.Long Term Goal(s):  Demonstrated use of positive/appropriate coping mechanisms and Prevent recurrence/exacerbation of symptoms     Target Date:  6/15/21Short Term Goal/Objective:  Patient will report/demonstrate a decrease in frequency and intensity of anxiety as evidenced by? patient self-report of anxiety symptom rating being at 2 or lower using a scale in which 0=none and 1 0=highest.      Target Date:  07/26/19     Status:  NewShort Term Goal/Objective:  Patient will return to baseline level of functioning as evidenced by? staff observation and patient self-report of being motivated to engage in daily responsiblities like feed my son, care for him, take him to the park, go to school,  groceries, laundry.     Target Date:  07/26/19     Status:  NewIntervention/Frequency:  Provide counseling and emotional support during IOP therapeutic groups, 3 days per week, 9 groups per week, 50 minutes each group, and individual and family therapy as needed.Intervention/Frequency:  Facilitate goal setting during group therapy, 3 groups per week, minimum of 50 minutes each group.Intervention/Frequency:  Encourage identification/practice/mastery of adaptive coping skills during CBT and/or DBT 2 groups per week, 50 minutes each group.Intervention/Frequency:  Encourage processing and discussion of the thoughts, feelings and Behaviors reported in daily check in session  During Group Therapy sessions, 3 per week, 50 minutes each.Intervention/Frequency:  Psychiatric Evaluation and/or medication management with LIP to review diagnostic impressions and specific treatment recommendations during bi-weekly, 15-30 minutes, or as needed via in-person, video and/or telephone sessionMood Alteration:  ActiveDescriptive Behaviors:  Patient is a 24 year old single female diagnosed with MDD recurrent severe, without psychosis.Patient's current symptoms were reviewed along with discussion related to treatment objectives, goals and interventions. Patient shared the following goals for herself: Be more stable and Be a better Zoe Scott. Patient was asked to elaborate on these goals.  Patient elaborated as follows:Current data:  Patient is able to maintain some routine in her day.  She reports right now I have to force myself to do that. She states she is able to me her and her son's basic needs and some household tasks.Goal:  Patient identifies keeping routine with my son including feed my son, care for him, take him to the park, go to school, groceries, laundry. Patient identifies she will have more motivation to engage in activities each day.  Patient reports at baseline, I feel very motivated. and I am capable of anything. Patient also identifies experiencing decreased anxiety and depression would be a goal.  Patient states she would expect anxiety and depression to be reduced but not eliminated.  Patient reports at baseline, never a point when I experience 0 anxiety and depression, it is always there. She would like the intensity and frequency of the symptoms to be reduced.Patient scored her symptoms in daily check-in group with a rating scale of 0=none and 10 = highest.  On 06/24/19 Patient rated symptoms as follows:  Depression = 6; Irritability = 5; Rumination = 6, Racing Thoughts = 5; Anxiety = 5; Sleep Problems = 0; Self-Injury = 0; Hallucinations = 0; Suicidal Thoughts = 0; Homicidal Thoughts = 0.Patient identified her long-term goal as:  Being stable.  Having my own shelter and having a career. Patient Stated Goal(s):  : Be more stable and Be a better [place].Long Term Goal(s):  Demonstration of mood stability, Prevent recurrence/exacerbation of symptoms and Demonstrated use of positive/appropriate coping mechanisms     Target Date:  6/15/21Short Term Goal/Objective:  Patient will report/demonstrate a decrease in severity and/or frequency of mood alteration as evidenced by ... Patient self-report of depression rating being at a 3 or lower using a scale in which 0=none and 10=highest.      Target Date: 07/26/19     Status:  NewShort Term Goal/Objective:  Patient will return to baseline level of functioning as evidenced by? staff observation and patient self-report of being motivated to engage in daily responsiblities like feed my son, care for him, take him to the park, go to school, groceries, laundry.     Target Date: 07/26/19     Status:  NewShort Term Goal/Objective:  Patient will identify/practice/demonstrate the use of adaptive coping skills as evidenced by ... Staff observation  and patient self-report of being able to utilize 2 or more adaptive coping strategies daily like discuss with family/friend, exercise, activities, praying, using therapy     Target Date: 07/26/19     Status:  NewIntervention/Frequency:  Provide counseling and emotional support during IOP therapeutic groups, 3 days per week, 9 groups per week, 50 minutes each group, and individual and family therapy as needed.Intervention/Frequency:  Monitor for/address symptoms of altered mood during group therapy, 3 groups per week, minimum of 50 minutes each group.Intervention/Frequency:  Encourage identification/practice/mastery of adaptive coping skills during CBT and/or DBT 2 groups per week, 50 minutes each group.Intervention/Frequency:  Encourage processing and discussion of the thoughts, feelings and Behaviors reported in daily check in session  During Group Therapy sessions, 3 per week, 50 minutes each.Intervention/Frequency:  Psychiatric Evaluation and/or medication management with [facility] to review diagnostic impressions and specific treatment recommendations during bi-weekly, 15-30 minutes, or as needed via in-person, video and/or telephone sessionSubstance Use / Abuse:  ActiveDescriptive Behaviors:  Update:Patient is requesting to attend the Co-occurring disorder group as well as to transfer to this track permanently.  Clinician spoke with patient via telephone after group to further assess.  Patient reports to smoke 100 dollars a week in cannabis for blunts a day of high quality cannabis.  Patient reports this is an increased amount from when she 1st initiated and reported in IOP treatment.  Per recent meeting with APRN; She reports using cannabis more often over the weekend, up to three times a day. Patient reports she started at 24 years of age but started smoking daily from 15 to 75 years of age.  She reported she stop smoking for 1 year due to the birth of her child.  Patient reports that 90 % of her friends are a negative influence that she has drug cravings that are part of her routine to sleep.  Patient reports she smokes why her son is asleep and prior to bed.  Patient reports that hearing other people's stories and other people struggles with this same issue is helpful to her and makes her grateful that she is not have a worse issue in reference to substance use but feels this is motivating in working on her substance abuse issue.  Based on information presented in this meeting by all providers; patient will be transferring to the Co-occurring disorder track as of today.  Treatment plan is being updated. Patient reports feeling more accepted and relatable in this track, has an increase used in marijuana and wishes to quit but needs supports. Patient agrees to complete toxicology test to time per week at [facility], and specifically decrease marijuana use towards abstinence as the goal.  Patient reports specifically her goal is to have a negative test before leaving.Original report: Patient is a 24 year old female diagnosed with Cannabis abuse,Patient reports marijuana use once a day, only at night, a couple of puffs at nighttime to get me to sleep.  Patient reports using $60 of marijuana per week.  Patient reports marijuana makes me calm. It will help me evaluate a situation I've done or going to be in.  Patient is receptive to IOP treatment and agrees to try decreasing use while in IOP.Patient Stated Goal(s):  : Be more stable and Be a better [place].Long Term Goal(s):  Re-establish sobriety, identify connection between psychiatric symptoms and substance abuse and develop an active relapse prevention plan and Demonstrated use of positive/appropriate coping mechanisms     Target Date:  6/15/21Short Term Goal/Objective:  Patient will identify/practice/demonstrate the use of adaptive coping skills as evidenced by ? staff observation and patient self-report of being able to utilize 2 or more adaptive coping strategies daily like discuss with family/friend, exercise, activities, praying, using therapy     Target Date: 07/26/19     Status:  NewShort Term Goal/Objective:  Patient will report a decrease in substance use as evidenced by ? Patient self-report and negative toxicology screening weekly.      Target Date: 07/26/19     Status:  NewShort Term Goal/Objective: Patient will identify/practice/demonstrate the use of adaptive coping skills as evidenced by ? during CBT and/or DBT 2 groups per week, 50 minutes each group.    Target Date:     Status: Intervention/Frequency:  Facilitate goal setting during group therapy, 3 groups per week, minimum of 50 minutes each group.Intervention/Frequency:  Encourage processing and discussion of the thoughts, feelings and Behaviors reported in daily check in session  During Group Therapy sessions, 3 per week, 50 minutes each.Intervention/Frequency:  Psychiatric Evaluation and/or medication management with [facility]IP to review diagnostic impressions and specific treatment recommendations during bi-weekly, 15-30 minutes, or as needed via in-person, video and/or telephone sessionSuicide Risk:  ActiveDescriptive Behaviors:  Zoe Scott was screened using the [place] Suicide Severity Rating Scale on intake.  Based on this screening a suicide risk assessment using clinical judgement and evidence-based guidelines was completed to comprehensively consider her acute and chronic risks of harm to self and others.  At this time, my assessment is that the patient is at high baseline chronic risk for harm to self.  Zoe Scott's acute risk is assessed to be low.  Concerning acute risk factors include history of SA, depressed mood, psychosocial factors (pandemic).  The patient has some protective factors which diminish risk including ability to cope with stress, religious beliefs, fear of death, reasons for living, problem solving skills and engaged in school. Based on this assessment the patient is felt to be appropriate for IOP level of care during which the treatment team will seek to further mitigate risk of harm to self via psychotropic medication prescription, engagement in intensive group therapy to develop and practice skills to help reduce distress, close clinical follow up with frequent reassessments of safety and treatment response, providing resources for crisis intervention supports including 211, the National Suicide Prevention Lifeline, [facility] adult IOP #, taking steps to remove or reduce access to means of self-harm or harm to others, providing relapse prevention support to reduce risks from drug or alcohol use, supporting patient in developing a plan to manage acute stressors, analysis of chain of events leading to and consequences of current suicidal/self-injurious/homicidal ideation and behaviors, identifying and then working with patient to remove or remediate prompting events for suicide, self-harm or violence, identifying supports and plan for patient to contact supports when in crisis, helping patient to generate feelings of hope and reasons for living, challenging maladaptive beliefs related to suicide, self injury or violence, establishing positive rapport with provider, emphatically telling patient not to commit suicide, self-injury or violence and developing or reviewing existing crisis plan.  Patient endorses being able to utilize safety plan that she developed with [facility] clinician. Patient Stated Goal(s):  : Be more stable and Be a better [place].Long Term Goal(s):  Re-establish safety to self and others and Prevent recurrence/exacerbation of symptoms     Target Date:  6/15/21Short Term Goal/Objective:  Patient will comply with prescribed treatment and safety planning as evidenced by. . . Patient actively engage in productive  conversations daily and/or as needed around suicide risk factors as well as protective factors.Discussions could include the following: specific warning signs that a crisis may be developing,internal coping strategies,people and social settings that provide a distraction,people whom the patient can directly ask for help,professionals or agencies the patient can contact during a crisis; including 211 and 911.And making the patient's immediate environment safe.     Target Date: 07/26/19     Status:  NewShort Term Goal/Objective:  Patient will express feelings and needs to others as evidenced by. . . Staff observation and patient self-report of verbalizing stressors and asking for help daily or as needed.     Target Date:  07/26/19     Status:  NewShort Term Goal/Objective:  Patient will identify/practice/demonstrate the use of adaptive coping skills as evidenced by. . . Staff observation and patient self-report of being able to utilize 2 or more adaptive coping strategies daily like discuss with family/friend, exercise, activities, praying, using therapy     Target Date:  07/26/19     Status:  NewShort Term Goal/Objective:  Patient will refrain from acting on self-destructive impulses as evidenced by. . . Staff observation and patient self-report of no impulsive and/or self-destructive/self-injurious behavior daily including substance use.     Target Date:  07/26/19     Status:  NewIntervention/Frequency:  Provide safe environment during IOP therapeutic groups, 3 days per week, 9 groups per week, 50 minutes each group, and individual and family therapy as needed.Intervention/Frequency:  Encourage identification/practice/mastery of adaptive coping skills during CBT and/or DBT 2 groups per week, 50 minutes each group.Intervention/Frequency:  Encourage processing and discussion of the thoughts, feelings and Behaviors reported in daily check in session  During Group Therapy sessions, 3 per week, 50 minutes each.Intervention/Frequency:  Psychiatric Evaluation and/or medication management with LIP to review diagnostic impressions and specific treatment recommendations during bi-weekly, 15-30 minutes, or as needed via in-person, video and/or telephone sessionOther Assessed Need(s) (Medical/Psychosocial)(List any other assessed needs that were identified in the assessment of the patient that are NOT included in the Interdisciplinary Problem List noted above.  Indicate what the INTERVENTION, REFERRAL or DEFERRAL will be for each of the other Assessed Needs identified (e.g. name of PCP, other medical provider, therapist, community resource, etc.)No other assessed need identifiedAny and All medical conditions noted in the patient?s Problem List in the EMR have been reviewed by the REACH MD/APRN and referred/deferred to the appropriate LIP for care.Other assessed Psychosocial Needs were not reported during time of Intake Evaluation and co-construction of ITP development.  Multidisciplinary Team will continue to provide ongoing assessment and make recommendations and or referrals as needed.Medical / Psychological Assessments: Medications:Assessment of medication side effects and/or adverse medication reactions is ongoing     Current Outpatient Medications Medication Sig ? hydrOXYzine (ATARAX) 25 mg tablet Take 1 tablet (25 mg total) by mouth every 4 (four) hours as needed (Anxiety). ? lithium (ESKALITH) 450 mg CR extended release tablet Take 1 tablet (450 mg total) by mouth 2 (two) times daily with breakfast and dinner. ? QUEtiapine (SEROQUEL) 50 mg Immediate Release tablet Take 3 tablets (150 mg total) by mouth 2 (two) times daily. ? zolpidem (AMBIEN) 5 mg tablet Take 1 tablet (5 mg total) by mouth nightly. No current facility-administered medications for this encounter.  Consult / Referral Made:No Consults or Referrals made at this timeNo other assessed needs at this time. Clinician will continue to evaluate and assess other needs throughout this course of treatment.Discharge Planning: Anticipated Discharge Date: 6-8  weeks from date of admissionAnticipated Discharge Level of Care/ Disposition:Outpatient TherapyOutpatient Medication ManagementPlan: Patient admitted to IOP level of care on 06/21/19 General Mental Health Treatment Track on June 29, 2019 patient transferred to Co-occurring Track  3 days  per week for further assessment, evaluation, medication management and group therapy to stabilize mood and to support and monitor patient's progress. In therapeutic group environment, patient provided with opportunity to acquire specific support, knowledge, skills, strategies and techniques to address presenting concerns. Individual and family therapy offered as needed. In light of COVID-19 precautions and to safely continue to provide treatment, IOP services are being provided via Zoom video and MyChart video sessions. In-person IOP and/or individual sessions will be contingent upon COVID-19 regulations and the [facility] ambulatory psychiatry operating procedures.   Co-construction of discharge plan ongoing.Treatment Team Representation:Margarita Maryelizabeth Kaufmann, MD - Psychiatrist, Medical DirectorKatherine Geneva, APRN - Psychiatric Nurse PractitionerJessica Dinisco, APRN - Psychiatric Nurse PractitionerJennifer Naughton, LCSW - Clinical CoordinatorNatalie Cristino, LCSW - Social Edd Arbour, [place] - Social WorkKaren Lafe, [place] - Social Celene Skeen, Charity fundraiser, BSN - Registered Alver Sorrow, RN - Assistant Nurse ManagerNicholas [place], BS, PCTNatalie Cristino, LCSW4/19/20212:51 [facility] Bartholome Bill, LCSW4/22/20219:10 AM

## 2019-07-07 NOTE — Other
Group Session: ?Group Therapy?Group Start Time: ??11:10 AM??????End Time: ?12:00 PMFacilitators:  Cindie Crumbly, LCSW; [place], [place], PCTVIDEO TELEHEALTH VISIT, PLATFORM OTHER THAN MYCHART: This clinician is conducting this visit at a site within our enrolled clinical location.Marland Kitchen ?For this visit the clinician and patient were present via interactive audio & video telecommunications system that permits real-time communications, via a platform other than MyChart.?Platforms other than MyChart may be used only during the COVID-19 crisis, when MyChart is not an option, and video is required for clinical reasons. ?Reason for choice of video platform other than MyChart: Currently [facility] has identified Zoom as the appropriate telehealth platform to facilitate IOP Group Therapy.?PLEASE NOTE: CONSENT FOR THIS VISIT IS DIFFERENT THAN FOR MYCHART VIDEO VISITS! ?Verbal consent must be obtained by the clinician or staff prior to starting visit.??Read the Following Statements to Video Visit Participants:????(Name of clinician) will be conducting your video visit today. Before we begin, we must obtain your consent for today's visit.Can you confirm your name, date of birth, and state where you (or your child, if applicable) are currently located?By consenting, you understand that today's visit will be conducted through video conferencing technology other than our usual secure service. While we do everything possible to ensure your privacy, we may not be able to guarantee the security of this particular platform. You understand that the visit will not be recorded, and that we will be documenting this visit in our electronic medical record, and that we may disclose records concerning this interaction to other clinicians. You understand that a video visit may limit our ability to diagnose your condition.??You understand that if you are experiencing a medical emergency, you will be directed to dial 9-1-1 as we are not able to connect you directly. You also understand if your condition changes after this visit and you need of further care, you will contact our office. Do you agree to this consent, knowing that you can change your decision at any time? Do you have any other questions before we proceed???Patient consent given for video visit: yes?State patient is located in: [place] At home?LocationThe clinician is appropriately licensed in the above state to provide care for this visit.??Session Focus? Connection between feelings, thoughts and behaviorsDevelop skills to manage issues and improve functioningStatus of symptoms / issuesTreatment goal and skill useDaily goal setting Number of Participants? 7 Treatment Modality? Group Therapy Interventions/Education? Assisted patient addressing stressful life situationsAssisted patient with discharge planningAssisted patient with strategies for addressing peer issuesAssisted patient with strategies for managing illness/symptomsAssisted patient with terminationEncouraged increased cooperation with othersEncouraged increased initiation in groupsEncouraged patient to increase treatment focusEncouraged patient to verbalize, rather than enactEncourages patient to provide constructive feedback to othersAssisted patient with strategies for addressing family issuesAssisted patient with strategies for addressing school issuesEncouraged acceptance, tolerance and respect for othersEncouraged appropriate expression of thoughts and feelingsEncouraged improved listening skillsEncouraged patient to request as-needed help solving problemsHelped decrease isolation/increase sense of belongingHelped identify feelings, thoughts and behaviors connectionHelped increase awareness of feelings, thoughts and behaviorsHelped patient acquire skills to build self-confidenceHelped patient identify strengthsHelped patient increase ability to gain support from othersHelped patient increase comfort with othersHelped patient increase interpersonal effectivenessHelped patient increase sense of acceptance by othersProvided assistance problem-solving challengesProvided positive reinforcement for successesProvided reinforcement for patient contributions to othersProvided reinforcement for use of adaptive coping skills ?Attendance???????????? Partial attendance due to meeting with APRN.  Total minutes attended: 74?Engagement InterventionsNot applicable - patient attended group without difficulty Degree of Participation? Active Behavior Alert, Cooperative, Guarded and Interactive Speech/Thought Process Coherent, Directed and  Relevant Affect/Mood Anxious, Depressed and Irritable Insight Fair Judgment Moderate Response to Interventions? Attentive, Engaged, Interested and Receptive Individualization? SUMMARY NOTE Group 2:During IOP Group Therapy via ZOOM Patients or presented handout on ?excuses not to recover?.  Clinician then presented what patient's reasons they have to recover and framed it in a reference of working on goals and treatment goal attainment.  Specifically this patient reported ?I Wanna life without habit?.  Patient was open honest that she likes to feel numb to herself feelings and emotions by recognizes that this is not the way she wants to parent ?it does not go hand in hand?. Patient reports currently to not be experiencing any ideation, intent, or plans in reference to suicidal or homicidal symptomatology.? Plan? Continue to assist patient with skills to improve functionContinue to assist patient with skills to manage symptomsContinue to reinforce use of positive coping skillsContinue to review and assess symptoms / issuesContinue to review and assess treatment goals / skill useContinue per Plan of Care / Interdisciplinary Plan of Care ??Cindie Crumbly, LCSW4/28/20215:35 PM

## 2019-07-07 NOTE — Group Note
Group Session 3:  Group TherapyGroup Start Time:   12:10 PM      End Time:   1:00 PMFacilitators:  Mackinze Criado, LCSWVIDEO TELEHEALTH VISIT, PLATFORM OTHER THAN MYCHART: This clinician is conducting this visit at a site within our enrolled clinical location..  For this visit the clinician and patient were present via interactive audio & video telecommunications system that permits real-time communications, via a platform other than MyChart.Platforms other than MyChart may be used only during the COVID-19 crisis, when MyChart is not an option, and video is required for clinical reasons. Reason for choice of video platform other than MyChart: Currently [facility] has identified Zoom as the appropriate telehealth platform to facilitate IOP Group Therapy.PLEASE NOTE: CONSENT FOR THIS VISIT IS DIFFERENT THAN FOR MYCHART VIDEO VISITS! Verbal consent must be obtained by the clinician or staff prior to starting visit.  Read the Following Statements to Video Visit Participants:  ?(Name of clinician) will be conducting your video visit today. Before we begin, we must obtain your consent for today's visit.Can you confirm your name, date of birth, and state where you (or your child, if applicable) are currently located?By consenting, you understand that today's visit will be conducted through video conferencing technology other than our usual secure service. While we do everything possible to ensure your privacy, we may not be able to guarantee the security of this particular platform. You understand that the visit will not be recorded, and that we will be documenting this visit in our electronic medical record, and that we may disclose records concerning this interaction to other clinicians. You understand that a video visit may limit our ability to diagnose your condition.  You understand that if you are experiencing a medical emergency, you will be directed to dial 9-1-1 as we are not able to connect you directly. You also understand if your condition changes after this visit and you need of further care, you will contact our office. Do you agree to this consent, knowing that you can change your decision at any time? Do you have any other questions before we proceed??Patient consent given for video visit: yesState patient is located in: [place]: at patient's home addressThe clinician is appropriately licensed in the above state to provide care for this visit.Other individuals present during the telehealth encounter and their role/relation: Identified group facilitator(s), support staff as needed, and fellow group participants.Session Focus Connection between feelings, thoughts and behaviorsDevelop skills to manage issues and improve functioningTreatment goal and skill use Number of Participants 7 Treatment Modality Psycho-education Interventions/Education Assisted patient addressing stressful life situationsAssisted patient with strategies for addressing peer issuesEncouraged increased cooperation with othersEncouraged increased initiation in groupsEncouraged improved listening skillsHelped decrease isolation/increase sense of belongingHelped identify feelings, thoughts and behaviors connectionHelped increase awareness of feelings, thoughts and behaviorsHelped patient acquire skills to build self-confidenceHelped patient identify strengthsHelped patient increase ability to gain support from othersHelped patient increase comfort with othersHelped patient increase interpersonal effectivenessHelped patient increase sense of acceptance by othersProvided assistance problem-solving challengesProvided positive reinforcement for successesProvided reinforcement for patient contributions to othersProvided reinforcement for use of adaptive coping skills Attendance            AttendedEngagement InterventionsNot applicable - patient attended group without difficulty Degree of Participation Active Behavior Calm, Cooperative and Oriented Speech/Thought Process Coherent, Directed and Relevant Affect/Mood Euthymic and Stable Insight Poor Judgment Fair Response to Interventions Attentive and Interested Individualization Today?s third group was conducted virtually through a [employer].  Group members were asked to think of what  advice they would give to their childhood self and to ask is there any think they?d do differently if they could. Patient was patiently waiting and actively listening until she was the last to share.  Patient said she would tell her younger self that she is worthy of recognition and believing in herself.Patient did not express any suicidal or homicidal thoughts, plan or intent during this group. Plan Continue to assist patient with skills to improve functionContinue to assist patient with skills to manage symptomsContinue to reinforce use of positive coping skillsContinue to review and assess symptoms / issuesContinue to review and assess treatment goals / skill useContinue per Plan of Care / Interdisciplinary Plan of Care Zoe Scott, LCSW4/28/20214:46 PM

## 2019-07-07 NOTE — Other
Group Session: ?Group Therapy?Group Start Time:???10:00 AM??????End Time:??11:00 AMFacilitators:??Cindie Crumbly, LCSW?VIDEO TELEHEALTH VISIT, PLATFORM OTHER THAN MYCHART: This clinician is conducting this visit at a site within our enrolled clinical location.Marland Kitchen ?For this visit the clinician and patient were present via interactive audio & video telecommunications system that permits real-time communications, via a platform other than MyChart.?Platforms other than MyChart may be used only during the COVID-19 crisis, when MyChart is not an option, and video is required for clinical reasons. ?Reason for choice of video platform other than MyChart: Currently [facility] has identified Zoom as the appropriate telehealth platform to facilitate IOP Group Therapy.?PLEASE NOTE: CONSENT FOR THIS VISIT IS DIFFERENT THAN FOR MYCHART VIDEO VISITS! ?Verbal consent must be obtained by the clinician or staff prior to starting visit.??Read the Following Statements to Video Visit Participants:????(Name of clinician) will be conducting your video visit today. Before we begin, we must obtain your consent for today's visit.Can you confirm your name, date of birth, and state where you (or your child, if applicable) are currently located?By consenting, you understand that today's visit will be conducted through video conferencing technology other than our usual secure service. While we do everything possible to ensure your privacy, we may not be able to guarantee the security of this particular platform. You understand that the visit will not be recorded, and that we will be documenting this visit in our electronic medical record, and that we may disclose records concerning this interaction to other clinicians. You understand that a video visit may limit our ability to diagnose your condition.??You understand that if you are experiencing a medical emergency, you will be directed to dial 9-1-1 as we are not able to connect you directly. You also understand if your condition changes after this visit and you need of further care, you will contact our office. Do you agree to this consent, knowing that you can change your decision at any time? Do you have any other questions before we proceed???Patient consent given for video visit: yes?State patient is located in:  At home?LocationThe clinician is appropriately licensed in the above state to provide care for this visit.??Session Focus? Connection between feelings, thoughts and behaviorsDevelop skills to manage issues and improve functioningStatus of symptoms / issuesTreatment goal and skill useDaily goal setting Number of Participants? 7 Treatment Modality? Group Therapy Interventions/Education? Assisted patient addressing stressful life situationsAssisted patient with discharge planningAssisted patient with strategies for addressing peer issuesAssisted patient with strategies for managing illness/symptomsAssisted patient with terminationEncouraged increased cooperation with othersEncouraged increased initiation in groupsEncouraged patient to increase treatment focusEncouraged patient to verbalize, rather than enactEncourages patient to provide constructive feedback to othersAssisted patient with strategies for addressing family issuesAssisted patient with strategies for addressing school issuesEncouraged acceptance, tolerance and respect for othersEncouraged appropriate expression of thoughts and feelingsEncouraged improved listening skillsEncouraged patient to request as-needed help solving problemsHelped decrease isolation/increase sense of belongingHelped identify feelings, thoughts and behaviors connectionHelped increase awareness of feelings, thoughts and behaviorsHelped patient acquire skills to build self-confidenceHelped patient identify strengthsHelped patient increase ability to gain support from othersHelped patient increase comfort with othersHelped patient increase interpersonal effectivenessHelped patient increase sense of acceptance by othersProvided assistance problem-solving challengesProvided positive reinforcement for successesProvided reinforcement for patient contributions to othersProvided reinforcement for use of adaptive coping skills ?Attendance???????????? Attended?Engagement InterventionsNot applicable - patient attended group without difficulty Degree of Participation? Active Behavior Alert, Cooperative, Guarded and Interactive Speech/Thought Process Coherent, Directed and Relevant Affect/Mood Anxious, Depressed and Irritable Insight Fair Judgment Moderate Response to Interventions? Attentive, Engaged, Interested and Receptive  Individualization? ?SUMMARY NOTE Group 1:During IOP Group Therapy via ZOOM Patient self-reported on a scale of 0 = None to 10= Highest; patient described their most concerning symptomatology as; depression 7, irritability 6, rumination 6, racing thoughts 6, anxiety 5, sleep problems 4.5, drug cravings 6 and 0 for all other assessed symptomatology.  Patient report was congruent with the patient's observed presentation.  Patient identifies things that went well/grateful for; my grandmother, roof over my head, my car, and school.  Patient identified purposely working on the following skill; decreasing cannabis to once a day as opposed to 4, getting outside more. Patient reports to be taking their medication as prescribed and reports no concerns noted at this time.  Patient reports difficulty with her depression and ?repetition at the same routine?.  Patient reports she would like to get out of the house and go to the gym more has difficulty finding a babysitter.  Patient reports currently to not be experiencing any ideation, intent, or plans in reference to suicidal or homicidal symptomatology.? Plan? Continue to assist patient with skills to improve functionContinue to assist patient with skills to manage symptomsContinue to reinforce use of positive coping skillsContinue to review and assess symptoms / issuesContinue to review and assess treatment goals / skill useContinue per Plan of Care / Interdisciplinary Plan of Care ??? ? [place] - Suicide Severity Screen? ? Most Recent Value Have you wished you were dead or wished you could go to sleep and not wake up? ?No Have you actually had any thoughts of killing yourself?? ?No Have you been thinking about how you might kill yourself?  ?No Have you had these thoughts and had some intention of acting on them? ?No Have you started to work out or worked out the details of how to kill yourself?  ?No Have you ever done anything, started to do anything, or prepared to do anything to end your life?  ?No Was this in the past 3 months? ?No [place] Suicide Risk Level ?Low Risk ??Cindie Crumbly, LCSW4/28/20215:33 PM

## 2019-07-07 NOTE — Progress Notes
VIDEO TELEHEALTH VISIT, PLATFORM OTHER THAN MYCHART: This clinician is conducting this visit at a site within our enrolled clinical location..  For this visit the clinician and patient were present via interactive audio & video telecommunications system that permits real-time communications, via a platform other than MyChart.Platforms other than MyChart may be used only during the COVID-19 crisis, when MyChart is not an option, and video is required for clinical reasons. Reason for choice of video platform other than MyChart: Patient unable to access MyChart Video secondary to currently being in [employer] via [employer].PLEASE NOTE: CONSENT FOR THIS VISIT IS DIFFERENT THAN FOR MYCHART VIDEO VISITS! Verbal consent must be obtained by the clinician or staff prior to starting visit.  Read the Following Statements to Video Visit Participants:  ?(Name of clinician) will be conducting your video visit today. Before we begin, we must obtain your consent for today's visit.Can you confirm your name, date of birth, and state where you (or your child, if applicable) are currently located?By consenting, you understand that today's visit will be conducted through video conferencing technology other than our usual secure service. While we do everything possible to ensure your privacy, we may not be able to guarantee the security of this particular platform. You understand that the visit will not be recorded, and that we will be documenting this visit in our electronic medical record, and that we may disclose records concerning this interaction to other clinicians. You understand that a video visit may limit our ability to diagnose your condition.  You understand that if you are experiencing a medical emergency, you will be directed to dial 9-1-1 as we are not able to connect you directly. You also understand if your condition changes after this visit and you need of further care, you will contact our office. Do you agree to this consent, knowing that you can change your decision at any time? Do you have any other questions before we proceed??Patient consent given for video visit: yesState patient is located in: [place]The clinician is appropriately licensed in the above state to provide care for this visit.Other individuals present during the telehealth encounter and their role/relation: noneTotal time spent in medical video consultation (required if billing based on time): 16 minutes  Start Time: 11:35  End Time: 11:51[facility] [phone] [address]Bridge[address] [place] [address] Number: [phone] Number: [phone] Outpatient Psychiatry MD/LIP Progress Note4/28/2021Start Time:  11:35      End Time:  11:51Session Type:  Medication ManagementAmanda Gubler is a 24 y.o., Single, female.SUBJECTIVE Chief Complaint: I just feel more depressed. Interim History:  24 year old female with past psychiatric history of bipolar disrder (manic and depressive) and a history of suicide attempts and anxiety disorder referred to [facility] adult IOP by [facility] following an inpatient admission for worsening depression and anxiety symptoms at the beginning of March 2021. However, she finished a 1/2 of a group and then left the program. On Thursday, 06/17/2019, [name] called [facility]ACH requesting a refill on her medication.  She reported feeling sick and very nauseated and lethargic. She was given a refill and started IOP on, 06/21/19. Inaara reports I feel depressed - a 6.5/10 (10 being the worst depressed mood) most of the time. She reports a decreased interest in activities, withdrawing socially, feelings of hopelessness, and doesn't want to wake up. She reports receiving approximately 7 hours of sleep per night and a decreased appetite from her baseline. Chika reports anxiety and when she  feels anxiety she smokes. It helps her calm down from mini panic attacks. She reports cutting down from four blunts per day to two blunts per day. Lithium was increased during her last session and reports no changes with mood. Jamiesha reports worsening tremor since increasing lithium. Plan to continue lithium as prescribed and repeat lithium level. Will start cross taper from seroquel to vraylar. Follow up next week. Lithium level for 419/21: 0.74Repeat lithium level?Plan: Continue IOP Start vraylar 1.5mg  daily Continue lithium 450mg  in the morning and 750mg  nightly  Decrease seroquel 100mg  in the AM and continue 150mg  at night Continue hydroxyzine 25mg  every four hours as needed for anxiety Continue zolpidem 5mg  every night ?REVIEW OF ALLERGIES/MEDICATIONS/HISTORY I have reviewed the patient's current medications and allergiesI have reviewed with the patient, the risks and benefits of the medications prescribed.OBJECTIVE Review of SystemsReview of Systems Psychiatric/Behavioral: Positive for dysphoric mood. The patient is nervous/anxious.  All other systems reviewed and are negative. LabsNo new labsCurrent MedicationsCurrent Outpatient Medications Medication Sig ? lithium carbonate (LITHOBID CR) 300 mg CR extended release tablet Take 1 tablet (300 mg total) by mouth nightly. Take with 450mg  nightly for a total of 750mg  nightly ? QUEtiapine (SEROQUEL) 50 mg Immediate Release tablet Take 3 tablets (150 mg total) by mouth 2 (two) times daily. ? zolpidem (AMBIEN) 5 mg tablet Take 1 tablet (5 mg total) by mouth nightly as needed for sleep. No current facility-administered medications for this encounter.  Mental Status ExamGeneral AppearanceHabitus:  MediumGrooming:  GoodMusculoskeletalStrength and Tone: Strength normalGait and Station: Stable gait and stable posturePsychiatricAttitude: Cooperative, good eye contact and pleasantPsychomotor Behavior: No psychomotor activation or retardationSpeech: normal rate, volume and prosodyMood: sad, anxious   Patient reported mood:  I feel goodAffect: Congruent to reported moodThought Process: Coherent, logical, goal directedAssociations: NormalThought Content: Normal no auditory hallucinations, no command auditory hallucinations, no paranoid delusions, not perseverativeSuicidal Ideation: No current suicidal plan, ideation or intentHomicidal Ideation: No current homicidal ideation, plan or intentJudgment:  GoodInsight:  GoodCognitive EvaluationOrientation: Oriented to person, oriented to place and oriented to date/time Attention and Concentration:  Normal attention and concentrationMemory: Recent and remote memory intactLanguage:  Language intactFund of Knowledge: average.Abstract Reasoning: Normal capacity for abstract reasoningSafety and Risk AssessmentPSY RISK ASSESSMENT SAFE-T WITH C-SSRS C-SSRS: Suicidal Ideation:  Since Last Assessment  WISH TO BE DEAD:  Yes  CURRENT SUICIDAL THOUGHTS:  No  SUICIDAL THOUGHTS WITH METHOD:  No  SUICIDAL INTENT WITHOUT SPECIFIC PLAN:  No  INTENT WITH PLAN:  No  Have you ever done anything, started to do anything or prepared to do anything to end your life?:  Yes  Was it within the past 3 months?:  NoSuicidal Ideation Intensity :   FREQUENCY:  Once a week  DURATION:  Fleeting - few seconds or minutes  CONTROLLABILITY:  Easily able to control thoughts  DETERRENTS:  Deterrents definitely stopped you from attempting suicide  REASONS FOR IDEATION:  Does not applyRisk Assessment:   Access to Lethal Methods:  NoCurrent and Past Psychiatric Diagnoses:    Mood Disorder:  Recurrent/Current  Psychotic Disorder:  No  Alcohol/Substance Abuse Disorders:  Recurrent/Current  PTSD:  No  ADHD:  No  TBI: No  Cluster B Personality Disorders or Traits:  No  Conduct Problems:  No  Suicide Attempt:  No Prior Attempts  Presenting Symptoms:  Anhedonia:  Yes  Impulsivity:  No  Hopelessness or Despair:  Yes  Anxiety and/or Panic:  No  Insomnia:  No  Command Hallucinations:  No  Psychosis:  No  Self-injurious Behavior:  No  Physical Aggression Toward Others:  No  Violence, Victimization and/or Perpetration:  No  Family History:   Suicide:  No  Suicidal Behavior:  No  Axis I Psychiatric Diagnosis requiring hospitalization:  No  Precipitants / Stressors:   Triggering events leading to humiliation, shame and/or despair:  No  Chronic physical pain or other acute medical problem:  No  Sexual / Physical Abuse:  No  Substance Intoxication or Withdrawal:  No  Pending Incarceration:  No  Legal Problems:  No  Inadequate Social Supports:  No  Social Isolation:  No  Perceived burden on others:  No  Bullying / Cyberbullying:  No  Media portrayal of suicide:  No  Housing Insecurity:  No  Financial Insecurity:  No  Stressful Life Events:  Yes  Gender / Sexual Identity:  No  Homelessness:  No  Change in Treatment:    Recent inpatient discharge:  No  Change in provider or treatment:  No  Hopeless or dissatisfied with provider or treatment:  No  Non-compliant:  Yes  Not receiving treatment:  NoCurrent and Past Psychiatric Diagnoses:  Mood Disorder:  Recurrent/Current  Psychotic Disorder:  No  Alcohol/Substance Abuse Disorders:  Recurrent/Current  PTSD:  No  ADHD:  No  TBI:  No  Cluster B Personality Disorders or Traits:  No  Conduct Problems:  No  Suicide Attempt:  No Prior AttemptsProtective Factors:   Internal:   Ability to cope with stress:  Yes  Frustration tolerance:  Yes  Fear of death or the actual act of killing self:  Yes  Identifies reasons for living:  Yes  Problem solving skills:  Yes  External:   Responsibility to children:  Yes  Beloved pets:  Yes  Supportive social network of friends or family:  Yes  Positive therapeutic relationships / Effective mental healthcare:  YesRisk to Self - Self-Injurious Behavior:   Current Urges to harm Self:  Yes  Recent Self-Injury:  No  History of Self Injury:  No  Imminent Risk for Self Injury in Community:  Low  Imminent Risk for Self Injury in Facility:  LowRisk to Others:   Current Agitation:  No  Homicidal/Aggressive Ideation:  No  Homicidal/Aggressive Threat/Plan:  No  Recent Violence/Aggression:  NoWithdrawal Risk:   Alcohol / Benzodiazepines / Barbiturates:  Not applicable     Alcohol/Benzodiazepine/Barbiturate Risk for Withdrawal:  Absent  Opioids:  Not applicable     Opioid Risk for Withdrawal:  AbsentMEDICAL DECISION MAKING DiagnosisProblem List         ICD-10-CM   OP/IOP/PHP Psychiatry  MDD (major depressive disorder), recurrent severe, 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 Floree reports feeling more depressed. Lithium was increased during her last session and reports no changes with mood. Katya reports worsening tremor since increasing lithium. Plan to continue lithium as prescribed and repeat lithium level. Will start cross taper from seroquel to vraylar. Follow up next week. Will continue with IOP at this time. Currently, no imminent safety concerns. The Self-Report [place] Risk Suicide Severity Rating Scale Since Last  Visit was completed by Voncille Lo and reviewed by myself today.  Additionally, risk assessment from [facility] intake assessment and follow up screenings have been reviewed.  Based on Goddess Delbuono?s clinical presentation today as well as her self-reported symptom ratings today and over the current course of treatment, I have assessed Kahlie Deutscher to be at unchanged risk of harm to self as compared with prior recent assessments.  Based on this assessment, I have continued current treatment plan including implementation of factors to mitigate risks of harm to self as outlined in the last risk assessmentIntervention(s)Supportive therapy Labs/Test/Consults OrderedRepeat lithium Medication ChangesStart vraylar 1.5mg  dailyDecrease seroquel to 100mg  in Am and 150mg  at night  Plan Start vraylar 1.5mg  dailyDecrease seroquel to 100mg  in Am and 150mg  at night  Continue lithium 450mg  in AM and 750mg  at nightContinue hydroxyzine 25mg  every four hours as needed for anxiety Continue zolpidem 5mg  every night as needed for sleep Continue IOP Discussed side effects, pros & cons of treatment, and alternative treatment options. Reviewed safety plan to call the clinic, the National Suicide Prevention Lifeline: 302 628 1301, or  911 to go to the ER if safety is at risk. Not deemed at imminent risk of harm to self/others at this timePatient verbalized understanding and had no additional questions. Electronically Signed:Samariah Hokenson, NP 07/07/2019 11:52 AM

## 2019-07-08 ENCOUNTER — Encounter: Admit: 2019-07-08 | Payer: PRIVATE HEALTH INSURANCE | Attending: Clinical | Primary: Cardiovascular Disease

## 2019-07-08 ENCOUNTER — Inpatient Hospital Stay: Admit: 2019-07-08 | Discharge: 2019-07-08 | Payer: MEDICAID | Primary: Cardiovascular Disease

## 2019-07-08 ENCOUNTER — Ambulatory Visit: Admit: 2019-07-08 | Payer: PRIVATE HEALTH INSURANCE | Primary: Cardiovascular Disease

## 2019-07-08 DIAGNOSIS — R251 Tremor, unspecified: Secondary | ICD-10-CM

## 2019-07-08 DIAGNOSIS — F411 Generalized anxiety disorder: Secondary | ICD-10-CM

## 2019-07-08 DIAGNOSIS — F332 Major depressive disorder, recurrent severe without psychotic features: Secondary | ICD-10-CM

## 2019-07-08 DIAGNOSIS — R5383 Other fatigue: Secondary | ICD-10-CM

## 2019-07-08 DIAGNOSIS — F3189 Other bipolar disorder: Secondary | ICD-10-CM

## 2019-07-08 NOTE — Telephone Encounter
Patient presented in IOP treatment on screen but was unable to verbalize due to some technical issue.  Patient presented in a car appeared to be moving but was in the passenger seat.  Clinician reported the patient could not participate with other people in the area listening as it was against the rule in relation to confidentiality.  Clinician reported he would put patient in the waiting room and that she could request a log back in when she was at her home location or another confidential area.  Patient then logged out and did not attempt to log back in.  Clinician called patient on break to encourage her to return to group if she was in a confidential location and patient's phone has a off the hook beeping noise.  Clinician called emergency contact patient's mother who abruptly said she is not here and gave this Clinical research associate and alternative phone number which has no voicemail set up therefore we could not make contact with patient.  No follow-up at this time.  Clinician will send message via my chart to patient would log in information for next week.Cindie Crumbly, LCSW4/29/202111:17 AM

## 2019-07-09 DIAGNOSIS — F3189 Other bipolar disorder: Secondary | ICD-10-CM

## 2019-07-09 DIAGNOSIS — F1210 Cannabis abuse, uncomplicated: Secondary | ICD-10-CM

## 2019-07-09 DIAGNOSIS — R251 Tremor, unspecified: Secondary | ICD-10-CM

## 2019-07-09 DIAGNOSIS — F332 Major depressive disorder, recurrent severe without psychotic features: Secondary | ICD-10-CM

## 2019-07-09 DIAGNOSIS — F411 Generalized anxiety disorder: Secondary | ICD-10-CM

## 2019-07-09 DIAGNOSIS — R5383 Other fatigue: Secondary | ICD-10-CM

## 2019-07-12 ENCOUNTER — Inpatient Hospital Stay: Admit: 2019-07-12 | Discharge: 2019-07-12 | Payer: MEDICAID | Primary: Cardiovascular Disease

## 2019-07-12 ENCOUNTER — Ambulatory Visit: Admit: 2019-07-12 | Payer: PRIVATE HEALTH INSURANCE | Primary: Cardiovascular Disease

## 2019-07-12 ENCOUNTER — Ambulatory Visit: Payer: BC Managed Care – PPO | Attending: Family Medicine | Admitting: Family Medicine

## 2019-07-12 ENCOUNTER — Other Ambulatory Visit: Payer: Self-pay

## 2019-07-12 ENCOUNTER — Telehealth (HOSPITAL_BASED_OUTPATIENT_CLINIC_OR_DEPARTMENT_OTHER): Payer: Self-pay

## 2019-07-12 DIAGNOSIS — F1210 Cannabis abuse, uncomplicated: Secondary | ICD-10-CM

## 2019-07-12 DIAGNOSIS — F3189 Other bipolar disorder: Secondary | ICD-10-CM

## 2019-07-12 DIAGNOSIS — F411 Generalized anxiety disorder: Secondary | ICD-10-CM

## 2019-07-12 DIAGNOSIS — R5383 Other fatigue: Secondary | ICD-10-CM

## 2019-07-12 MED ORDER — LITHIUM CARBONATE ER 300 MG TABLET,EXTENDED RELEASE
300 mg | ORAL_TABLET | Freq: Two times a day (BID) | ORAL | 1 refills | Status: AC
Start: 2019-07-12 — End: 2019-07-12

## 2019-07-12 MED ORDER — QUETIAPINE IMMEDIATE RELEASE 100 MG TABLET
100 mg | ORAL_TABLET | Freq: Every evening | ORAL | 1 refills | Status: AC
Start: 2019-07-12 — End: 2019-08-05

## 2019-07-12 MED ORDER — LITHIUM CARBONATE ER 300 MG TABLET,EXTENDED RELEASE
300 mg | ORAL_TABLET | Freq: Two times a day (BID) | ORAL | 1 refills | Status: AC
Start: 2019-07-12 — End: 2019-07-20

## 2019-07-12 MED ORDER — MUPIROCIN 2 % EX OINT
TOPICAL_OINTMENT | Freq: Two times a day (BID) | CUTANEOUS | 0 refills | Status: DC
Start: 2019-07-12 — End: 2021-08-22

## 2019-07-12 NOTE — Telephone Encounter (Signed)
Screening for possible COVID:    1. In the past 14 days, has the patient been exposed to [place] or told to quarantine because of a COVID exposure?  No   2. In the past 14 days, has the patient had new or worsening (for chronic symptoms) fever, cough, shortness of breath, body aches, runny nose, sore throat, nausea, diarrhea, or lack of smell/taste? No   3. In the past 14 days, has the patient had COVID or suspected COVID?  No    Dawn Merritt, 07/12/2019

## 2019-07-12 NOTE — Progress Notes (Signed)
[facility]  Office Visit Note   Patient presents with:  Physical    Subjective:   Dawn Merritt is a 24 year old female patient who presents today for umbilical odor    Hx of abscess a few years ago - need abx and drainage/packing  Seems like beginning of similar symptoms  Sweat, odor, drainage for the last week - increased cleaning but end of the day it seems to accumulate  No pain currently, that was late presenting symptom in the past    ROS: no fevers or chills    She reports that she is a non-smoker but has been exposed to tobacco smoke. She has never used smokeless tobacco.    Objective:   BP 127/84 (Site: LA, Position: Sitting, Cuff Size: Lrg)    Pulse 101    Wt 98 kg (216 lb)    LMP 07/11/2019    SpO2 98%    BMI 37.08 kg/m   General: alert, appears stated age and cooperative   Skin: no surrounding erythema on abdomen, mild patch of erythema within umbilicus, tender to palpation only at umbilical recess - no abd pain, small amount of white discharge      Assessment & Plan:      Diagnosis:  Infection of umbilical stump   Orders Placed This Encounter      Wound Culture and Gram Stain      mupirocin (BACTROBAN) 2 % ointment          No granuloma or obvious skin involvement   Culture sent of discharge, advised clean and dry during the day and avoid digging with fingers   Topical antibiotic given hx    Jeananne Rama, MD  07/12/2019

## 2019-07-12 NOTE — Other
Group Session:  Group Therapy Group Start Time:   12:10 PM      End Time:  1:00 PMFacilitators:  Cindie Crumbly, LCSWVIDEO TELEHEALTH VISIT, PLATFORM OTHER THAN MYCHART: This clinician is conducting this visit at a site within our enrolled clinical location.Marland Kitchen ?For this visit the clinician and patient were present via interactive audio & video telecommunications system that permits real-time communications, via a platform other than MyChart.?Platforms other than MyChart may be used only during the COVID-19 crisis, when MyChart is not an option, and video is required for clinical reasons. ?Reason for choice of video platform other than MyChart: Currently [facility] has identified Zoom as the appropriate telehealth platform to facilitate IOP Group Therapy.?PLEASE NOTE: CONSENT FOR THIS VISIT IS DIFFERENT THAN FOR MYCHART VIDEO VISITS! ?Verbal consent must be obtained by the clinician or staff prior to starting visit.??Read the Following Statements to Video Visit Participants:????(Name of clinician) will be conducting your video visit today. Before we begin, we must obtain your consent for today's visit.Can you confirm your name, date of birth, and state where you (or your child, if applicable) are currently located?By consenting, you understand that today's visit will be conducted through video conferencing technology other than our usual secure service. While we do everything possible to ensure your privacy, we may not be able to guarantee the security of this particular platform. You understand that the visit will not be recorded, and that we will be documenting this visit in our electronic medical record, and that we may disclose records concerning this interaction to other clinicians. You understand that a video visit may limit our ability to diagnose your condition.??You understand that if you are experiencing a medical emergency, you will be directed to dial 9-1-1 as we are not able to connect you directly. You also understand if your condition changes after this visit and you need of further care, you will contact our office. Do you agree to this consent, knowing that you can change your decision at any time? Do you have any other questions before we proceed???Patient consent given for video visit: yes?State patient is located in: [place] At home?LocationThe clinician is appropriately licensed in the above state to provide care for this visit.??Session Focus? Connection between feelings, thoughts and behaviorsDevelop skills to manage issues and improve functioningStatus of symptoms / issuesTreatment goal and skill useDaily goal setting Number of Participants? 6 Treatment Modality? Group Therapy Interventions/Education? Assisted patient addressing stressful life situationsAssisted patient with discharge planningAssisted patient with strategies for addressing peer issuesAssisted patient with strategies for managing illness/symptomsAssisted patient with terminationEncouraged increased cooperation with othersEncouraged increased initiation in groupsEncouraged patient to increase treatment focusEncouraged patient to verbalize, rather than enactEncourages patient to provide constructive feedback to othersAssisted patient with strategies for addressing family issuesAssisted patient with strategies for addressing school issuesEncouraged acceptance, tolerance and respect for othersEncouraged appropriate expression of thoughts and feelingsEncouraged improved listening skillsEncouraged patient to request as-needed help solving problemsHelped decrease isolation/increase sense of belongingHelped identify feelings, thoughts and behaviors connectionHelped increase awareness of feelings, thoughts and behaviorsHelped patient acquire skills to build self-confidenceHelped patient identify strengthsHelped patient increase ability to gain support from othersHelped patient increase comfort with othersHelped patient increase interpersonal effectivenessHelped patient increase sense of acceptance by othersProvided assistance problem-solving challengesProvided positive reinforcement for successesProvided reinforcement for patient contributions to othersProvided reinforcement for use of adaptive coping skills ?Attendance???????????? Attended?Engagement InterventionsNot applicable - patient attended group without difficulty Degree of Participation? Active Behavior Alert, Cooperative, Guarded and Interactive Speech/Thought Process Coherent, Directed and Relevant Affect/Mood Anxious,  Depressed and Irritable Insight Fair Judgment Moderate Response to Interventions? Attentive, Engaged, Interested and Receptive Individualization? ?SUMMARY NOTE Group 3:During IOP Group Therapy via ZOOM Patients were presented information on ?fair fighting rules?; encouraging patient is to think about their feelings in relation to the conflict, discussed 1 issue at a time, what language use and not use, how to take turns, used time-out as needed, and come to a compromise or understanding.  Specifically this patient reported questions in relation to the topic and a relationship she may try to restart with an ex-boyfriend.  Patient questioned how you could regain trust and work on trust.  Addition patient reported that she is focusing on ?worry about my son and not to rush for someone? taking time and not be impulsive in her relationship decisions.  Patient was encouraged to work on herself 1st to better her ability to manage relationships.  Patient reports currently to not be experiencing any ideation, intent, or plans in reference to suicidal or homicidal symptomatology.? Plan? Continue to assist patient with skills to improve functionContinue to assist patient with skills to manage symptomsContinue to reinforce use of positive coping skillsContinue to review and assess symptoms / issuesContinue to review and assess treatment goals / skill useContinue per Plan of Care / Interdisciplinary Plan of Care ?Cindie Crumbly, LCSW5/3/20215:21 PM

## 2019-07-12 NOTE — Progress Notes
VIDEO TELEHEALTH VISIT, PLATFORM OTHER THAN MYCHART: This clinician is conducting this visit at a site within our enrolled clinical location..  For this visit the clinician and patient were present via interactive audio & video telecommunications system that permits real-time communications, via a platform other than MyChart.Platforms other than MyChart may be used only during the COVID-19 crisis, when MyChart is not an option, and video is required for clinical reasons. Reason for choice of video platform other than MyChart: Patient unable to access MyChart Video secondary to currently being in [employer] via [employer].PLEASE NOTE: CONSENT FOR THIS VISIT IS DIFFERENT THAN FOR MYCHART VIDEO VISITS! Verbal consent must be obtained by the clinician or staff prior to starting visit.  Read the Following Statements to Video Visit Participants:  ?(Name of clinician) will be conducting your video visit today. Before we begin, we must obtain your consent for today's visit.Can you confirm your name, date of birth, and state where you (or your child, if applicable) are currently located?By consenting, you understand that today's visit will be conducted through video conferencing technology other than our usual secure service. While we do everything possible to ensure your privacy, we may not be able to guarantee the security of this particular platform. You understand that the visit will not be recorded, and that we will be documenting this visit in our electronic medical record, and that we may disclose records concerning this interaction to other clinicians. You understand that a video visit may limit our ability to diagnose your condition.  You understand that if you are experiencing a medical emergency, you will be directed to dial 9-1-1 as we are not able to connect you directly. You also understand if your condition changes after this visit and you need of further care, you will contact our office. Do you agree to this consent, knowing that you can change your decision at any time? Do you have any other questions before we proceed??Patient consent given for video visit: yesState patient is located in: [place]The clinician is appropriately licensed in the above state to provide care for this visit.Other individuals present during the telehealth encounter and their role/relation: noneTotal time spent in medical video consultation (required if billing based on time): 23 minutes  Start Time: 11:05  End Time: 11:28[facility] [phone] [address]Bridge[address] [place] [address]QMVHQ Number: [phone] Number: [phone] Outpatient Psychiatry MD/LIP Progress Note5/3/2021Start Time:  11:05      End Time:  11:28Session Type:  Medication ManagementAmanda Mattson is a 24 y.o., Single, female.SUBJECTIVE Chief Complaint: I didn't get all my medications filled. Interim History:  24 year old female with past psychiatric history of bipolar disrder (manic and depressive) and a history of suicide attempts and anxiety disorder referred to [facility] adult IOP by [facility] following an inpatient admission for worsening depression and anxiety symptoms at the beginning of March 2021. However, she finished a 1/2 of a group and then left the program. On Thursday, 06/17/2019, [name] called [facility] requesting a refill on her medication.  She reported feeling sick and very nauseated and lethargic. She was given a refill and started IOP on, 06/21/19. Tannia reports, I didn't have all of my  Medications and the pharmacy only gave me 300mg  of lithium and not the other 450mg . (She is prescribed 750mg  at night and is only now taking 300mg  since Thursday). She reports taking seroquel 150mg  in the morning and night. During the last session the plan was to begin  decreasing seroquel and start vraylar. Nyjai has not started [employer]. Domingue reports feeling depressed most of the time, rating a subjective depressed mood of 7/10 (10 being the worst) most of the time. She reports having Moments of excitement that last only a few minutes but then feel depressed and anxious again. She reports no agitation or irritability and that this week it has been under control. She reports, sleep is okay but this week it is really light. I wake up for an hour at night and then go back to sleep. She reports, in the morning it is hard to get out of bed because I am so tired. She reports no suicidal thoughts. Over the weekend she went to the park with her son and went to the gym. Kinslea reports a subjective anxiety rating of 6/10 (10 being the worse) with worrying and feelings of tension. She reports no panic attacks since the last visit. She reports using less cannabis (decreased from 4 blunts per day to 2 blunts per day).   Plan to continue lithium 300mg  twice a day and repeat lithium level at the end of the week. Will start cross taper from seroquel to vraylar but will start vraylar first prior to tapering down seroquel. Follow up next week. Lithium level for 419/21: 0.74Repeat lithium level to be done on 07/15/19?Plan: Continue IOP Start vraylar 1.5mg  daily Continue lithium 300mg  in the morning and 300mg  nightly  Continue seroquel 150mg  in the AM and continue 150mg  at night (plan to decrease next week). Continue hydroxyzine 25mg  every four hours as needed for anxiety Continue zolpidem 5mg  every night ?REVIEW OF ALLERGIES/MEDICATIONS/HISTORY I have reviewed the patient's current medications and allergiesI have reviewed with the patient, the risks and benefits of the medications prescribed.OBJECTIVE Review of SystemsReview of Systems Psychiatric/Behavioral: Positive for dysphoric mood. The patient is nervous/anxious.  All other systems reviewed and are negative. LabsNo new labsCurrent MedicationsCurrent Outpatient Medications Medication Sig ? cariprazine 1.5 mg Cap Take 1 capsule (1.5 mg total) by mouth daily. ? lithium carbonate (LITHOBID CR) 300 mg CR extended release tablet Take 1 tablet (300 mg total) by mouth nightly. Take with 450mg  nightly for a total of 750mg  nightly ? QUEtiapine (SEROQUEL) 100 mg Immediate Release tablet Take 100mg  by mouth every morning and 150mg  by mouth every night ? zolpidem (AMBIEN) 5 mg tablet Take 1 tablet (5 mg total) by mouth nightly as needed for sleep. No current facility-administered medications for this encounter.  Mental Status ExamGeneral AppearanceHabitus:  MediumGrooming:  GoodMusculoskeletalStrength and Tone: Strength normalGait and Station: Stable gait and stable posturePsychiatricAttitude: Cooperative, good eye contact and pleasantPsychomotor Behavior: No psychomotor activation or retardationSpeech: normal rate, volume and prosodyMood: sad, anxious   Patient reported mood:  I feel goodAffect: Congruent to reported moodThought Process: Coherent, logical, goal directedAssociations: NormalThought Content: Normal no auditory hallucinations, no command auditory hallucinations, no paranoid delusions, not perseverativeSuicidal Ideation: No current suicidal plan, ideation or intentHomicidal Ideation: No current homicidal ideation, plan or intentJudgment:  GoodInsight:  GoodCognitive EvaluationOrientation: Oriented to person, oriented to place and oriented to date/time Attention and Concentration:  Normal attention and concentrationMemory: Recent and remote memory intactLanguage:  Language intactFund of Knowledge: average.Abstract Reasoning: Normal capacity for abstract reasoningSafety and Risk AssessmentPSY RISK ASSESSMENT SAFE-T WITH C-SSRS C-SSRS: Suicidal Ideation:  Since Last Assessment  WISH TO BE DEAD:  No  CURRENT SUICIDAL THOUGHTS:  No  SUICIDAL THOUGHTS WITH METHOD:  No  SUICIDAL INTENT WITHOUT SPECIFIC PLAN:  No  INTENT WITH PLAN:  No  Have  you ever done anything, started to do anything or prepared to do anything to end your life?:  Yes  Was it within the past 3 months?:  NoSuicidal Ideation Intensity :   FREQUENCY:  Once a week  DURATION:  Fleeting - few seconds or minutes  CONTROLLABILITY:  Easily able to control thoughts  DETERRENTS:  Deterrents definitely stopped you from attempting suicide  REASONS FOR IDEATION:  Does not applyRisk Assessment:   Access to Lethal Methods:  NoCurrent and Past Psychiatric Diagnoses:    Mood Disorder:  Recurrent/Current  Psychotic Disorder:  No  Alcohol/Substance Abuse Disorders:  Recurrent/Current  PTSD:  No  ADHD:  No  TBI:  No  Cluster B Personality Disorders or Traits:  No  Conduct Problems:  No  Suicide Attempt:  No Prior Attempts  Presenting Symptoms:  Anhedonia:  Yes  Impulsivity:  No  Hopelessness or Despair:  Yes  Anxiety and/or Panic:  No  Insomnia:  No  Command Hallucinations:  No  Psychosis:  No  Self-injurious Behavior:  No  Physical Aggression Toward Others:  No  Violence, Victimization and/or Perpetration:  No  Family History:   Suicide:  No  Suicidal Behavior:  No  Axis I Psychiatric Diagnosis requiring hospitalization:  No  Precipitants / Stressors:   Triggering events leading to humiliation, shame and/or despair:  No  Chronic physical pain or other acute medical problem:  No  Sexual / Physical Abuse:  No  Substance Intoxication or Withdrawal:  No  Pending Incarceration:  No  Legal Problems:  No  Inadequate Social Supports:  No  Social Isolation:  No  Perceived burden on others:  No  Bullying / Cyberbullying:  No  Media portrayal of suicide:  No  Housing Insecurity:  No  Financial Insecurity:  No  Stressful Life Events: Yes  Gender / Sexual Identity:  No  Homelessness:  No  Change in Treatment:    Recent inpatient discharge:  No  Change in provider or treatment:  No  Hopeless or dissatisfied with provider or treatment:  No  Non-compliant:  Yes  Not receiving treatment:  NoCurrent and Past Psychiatric Diagnoses:  Mood Disorder:  Recurrent/Current  Psychotic Disorder:  No  Alcohol/Substance Abuse Disorders:  Recurrent/Current  PTSD:  No  ADHD:  No  TBI:  No  Cluster B Personality Disorders or Traits:  No  Conduct Problems:  No  Suicide Attempt:  No Prior AttemptsProtective Factors:   Internal:   Ability to cope with stress:  Yes  Frustration tolerance:  Yes  Fear of death or the actual act of killing self:  Yes  Identifies reasons for living:  Yes  Problem solving skills:  Yes  External:   Responsibility to children:  Yes  Beloved pets:  Yes  Supportive social network of friends or family:  Yes  Positive therapeutic relationships / Effective mental healthcare:  YesRisk to Self - Self-Injurious Behavior:   Current Urges to harm Self:  Yes  Recent Self-Injury:  No  History of Self Injury:  No  Imminent Risk for Self Injury in Community:  Low  Imminent Risk for Self Injury in Facility:  LowRisk to Others:   Current Agitation:  No  Homicidal/Aggressive Ideation:  No  Homicidal/Aggressive Threat/Plan:  No  Recent Violence/Aggression:  NoWithdrawal Risk:   Alcohol / Benzodiazepines / Barbiturates:  Not applicable     Alcohol/Benzodiazepine/Barbiturate Risk for Withdrawal:  Absent  Opioids:  Not applicable     Opioid Risk for Withdrawal:  AbsentMEDICAL DECISION  MAKING DiagnosisProblem List         ICD-10-CM   OP/IOP/PHP Psychiatry  MDD (major depressive disorder), recurrent severe, 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 Shruthi reports feeling no changes in her mood and over the weekend not having enough medication so decreased her amount of lithium (now taking 300mg  twice a day). She has not started vraylar. Will start vraylar today, continue with seroquel 150mg  twice a day (she did not decrease this dosage yet), continue lithium 300mg  twice a day, repeat lithium lab on Thursday this week, and follow-up at the end of the week. Will continue with IOP at this time. Currently, no imminent safety concerns. The Self-Report [place] Risk Suicide Severity Rating Scale Since Last Visit was completed by Voncille Lo and reviewed by myself today.  Additionally, risk assessment from [facility] intake assessment and follow up screenings have been reviewed.  Based on Olivene Mcmorris?s clinical presentation today as well as her self-reported symptom ratings today and over the current course of treatment, I have assessed Moesha Sarchet to be at unchanged risk of harm to self as compared with prior recent assessments.  Based on this assessment, I have continued current treatment plan including implementation of factors to mitigate risks of harm to self as outlined in the last risk assessmentIntervention(s)Supportive therapy Labs/Test/Consults OrderedRepeat lithium Medication ChangesStart vraylar 1.5mg  daily  PlanStart vraylar 1.5mg  daily Continue lithium 300mg  in the morning and 300mg  nightly  Continue seroquel 150mg  in the AM and continue 150mg  at night (plan to decrease next week). Continue hydroxyzine 25mg  every four hours as needed for anxiety Continue zolpidem 5mg  every night ? Discussed side effects, pros & cons of treatment, and alternative treatment options. Reviewed safety plan to call the clinic, the National Suicide Prevention Lifeline: 409-570-0123, or  911 to go to the ER if safety is at risk. Not deemed at imminent risk of harm to self/others at this timePatient verbalized understanding and had no additional questions. Electronically Signed:Keonte Daubenspeck, NP 07/12/2019 11:28 AM

## 2019-07-12 NOTE — Other
Group Session: ?Group Therapy?Group Start Time:???10:00 AM??????End Time:??11:00 AMFacilitators:??Cindie Crumbly, LCSW?VIDEO TELEHEALTH VISIT, PLATFORM OTHER THAN MYCHART: This clinician is conducting this visit at a site within our enrolled clinical location.Marland Kitchen ?For this visit the clinician and patient were present via interactive audio & video telecommunications system that permits real-time communications, via a platform other than MyChart.?Platforms other than MyChart may be used only during the COVID-19 crisis, when MyChart is not an option, and video is required for clinical reasons. ?Reason for choice of video platform other than MyChart: Currently [facility] has identified Zoom as the appropriate telehealth platform to facilitate IOP Group Therapy.?PLEASE NOTE: CONSENT FOR THIS VISIT IS DIFFERENT THAN FOR MYCHART VIDEO VISITS! ?Verbal consent must be obtained by the clinician or staff prior to starting visit.??Read the Following Statements to Video Visit Participants:????(Name of clinician) will be conducting your video visit today. Before we begin, we must obtain your consent for today's visit.Can you confirm your name, date of birth, and state where you (or your child, if applicable) are currently located?By consenting, you understand that today's visit will be conducted through video conferencing technology other than our usual secure service. While we do everything possible to ensure your privacy, we may not be able to guarantee the security of this particular platform. You understand that the visit will not be recorded, and that we will be documenting this visit in our electronic medical record, and that we may disclose records concerning this interaction to other clinicians. You understand that a video visit may limit our ability to diagnose your condition.??You understand that if you are experiencing a medical emergency, you will be directed to dial 9-1-1 as we are not able to connect you directly. You also understand if your condition changes after this visit and you need of further care, you will contact our office. Do you agree to this consent, knowing that you can change your decision at any time? Do you have any other questions before we proceed???Patient consent given for video visit: yes?State patient is located in: [place] At home?LocationThe clinician is appropriately licensed in the above state to provide care for this visit.??Session Focus? Connection between feelings, thoughts and behaviorsDevelop skills to manage issues and improve functioningStatus of symptoms / issuesTreatment goal and skill useDaily goal setting Number of Participants? 6 Treatment Modality? Group Therapy Interventions/Education? Assisted patient addressing stressful life situationsAssisted patient with discharge planningAssisted patient with strategies for addressing peer issuesAssisted patient with strategies for managing illness/symptomsAssisted patient with terminationEncouraged increased cooperation with othersEncouraged increased initiation in groupsEncouraged patient to increase treatment focusEncouraged patient to verbalize, rather than enactEncourages patient to provide constructive feedback to othersAssisted patient with strategies for addressing family issuesAssisted patient with strategies for addressing school issuesEncouraged acceptance, tolerance and respect for othersEncouraged appropriate expression of thoughts and feelingsEncouraged improved listening skillsEncouraged patient to request as-needed help solving problemsHelped decrease isolation/increase sense of belongingHelped identify feelings, thoughts and behaviors connectionHelped increase awareness of feelings, thoughts and behaviorsHelped patient acquire skills to build self-confidenceHelped patient identify strengthsHelped patient increase ability to gain support from othersHelped patient increase comfort with othersHelped patient increase interpersonal effectivenessHelped patient increase sense of acceptance by othersProvided assistance problem-solving challengesProvided positive reinforcement for successesProvided reinforcement for patient contributions to othersProvided reinforcement for use of adaptive coping skills ?Attendance???????????? Attended?Engagement InterventionsNot applicable - patient attended group without difficulty Degree of Participation? Active Behavior Alert, Cooperative, Guarded and Interactive Speech/Thought Process Coherent, Directed and Relevant Affect/Mood Anxious, Depressed and Irritable Insight Fair Judgment Moderate Response to Interventions? Attentive, Engaged, Interested and Receptive  Individualization? ?SUMMARY NOTE Group 1:During IOP Group Therapy via ZOOM Patient self-reported on a scale of 0 = None to 10= Highest; patient described their most concerning symptomatology as; depression 5, irritability 4, rumination 6, racing thoughts 5, anxiety 5, sleep problems 6, drug cravings 5 and 0 for all other assessed symptomatology.  Patient report was congruent with the patient's observed presentation.  Patient reports goal for the week as:  Increased motivation and organized myself better.  Patient identifies things that went well/grateful for; proud of myself for sticking with the gym and I feel good after doing it.  Patient identified purposely working on the following skill; increased exercise feeling unmotivated and overwhelmed with her work in school load. Patient reports to be taking their medication as prescribed although ?I am confused with all the changes so I am not really sure?; patient seeing APRN today and reports no concerns noted at this time. Patient reports currently to not be experiencing any ideation, intent, or plans in reference to suicidal or homicidal symptomatology.? Plan? Continue to assist patient with skills to improve functionContinue to assist patient with skills to manage symptomsContinue to reinforce use of positive coping skillsContinue to review and assess symptoms / issuesContinue to review and assess treatment goals / skill useContinue per Plan of Care / Interdisciplinary Plan of Care ??? ? [place] - Suicide Severity Screen? ? Most Recent Value Have you wished you were dead or wished you could go to sleep and not wake up? ?No Have you actually had any thoughts of killing yourself?? ?No Have you been thinking about how you might kill yourself?  ?No Have you had these thoughts and had some intention of acting on them? ?No Have you started to work out or worked out the details of how to kill yourself?  ?No Have you ever done anything, started to do anything, or prepared to do anything to end your life?  ?No Was this in the past 3 months? ?No [place] Suicide Risk Level ?Low Risk ??Cindie Crumbly, LCSW5/3/20215:20 PM

## 2019-07-13 ENCOUNTER — Encounter: Admit: 2019-07-13 | Payer: PRIVATE HEALTH INSURANCE | Primary: Cardiovascular Disease

## 2019-07-13 ENCOUNTER — Encounter: Admit: 2019-07-13 | Payer: PRIVATE HEALTH INSURANCE | Attending: Clinical | Primary: Cardiovascular Disease

## 2019-07-13 ENCOUNTER — Encounter: Admit: 2019-07-13 | Payer: PRIVATE HEALTH INSURANCE | Attending: Psychiatry | Primary: Cardiovascular Disease
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
## 2019-07-13 NOTE — Progress Notes
Pt called at 10am and reported to writer that pharmacy did not dispense lithium CR 450mg  tablets so since Friday 07/09/2019 pt has been taking lithium IR 300mg  twice daily instead if lithium CR 450mg  in the am and lithium CR 450mg  plus lithium IR 300mg  in pm. For a total dose of 450mg  in the am and 750mg  daily in the pm.   Pt reported she was going [facility]. Clinical research associate informed pt that Clinical research associate would contact APRN and call pt back later today. Pt acknowledged understanding. Then writer informed Shanda Bumps Dinisco APRN and Dr. Maryelizabeth Kaufmann at 1030am. Shanda Bumps Dinisco APRN informed writer that she discussed the plan for medication in depth with pt yesterday. (see progress note for details 07/12/2019 at 1108am) Shanda Bumps Dinisco APRN recommended that pt receive visiting nursing services for medication administration since pt has not been taking medications as ordered and discussed with pt yesterday. Lavonia Drafts LCSW will follow up and obtain with permission from pt for visiting nurses and discuss a schedule for visits that does not conflict with pt's schedule, Vance Gather, RN5/4/20211:08 PM

## 2019-07-13 NOTE — Progress Notes
Writer faxed referral for daily visiting nurses in the am with a pre-pour for evenings to [facility]. [phone]). [facility] called [facility] [phone] spoke with Toney Reil( front desk) daisy reported she told Gene walsh to expect referral and asked if start of care can be tomorrow.  Vance Gather, RN5/4/20211:41 PM

## 2019-07-13 NOTE — Care Coordination-Inpatient
Clinician spoke with patient about the benefits of daily visiting nurse and patient agreed this would be helpful to her to have a daily visiting nurse to help her organized, take correctly and understand her medication regiment.  Clinician triaged phone call to RN to follow up with patient on explaining her medication regiment again to her today as patient continues to be confused and to follow-up with making the nursing order for daily visiting nurse with [facility].Cindie Crumbly, LCSW5/4/20211:28 PM

## 2019-07-14 ENCOUNTER — Ambulatory Visit: Admit: 2019-07-14 | Payer: PRIVATE HEALTH INSURANCE | Primary: Cardiovascular Disease

## 2019-07-14 ENCOUNTER — Inpatient Hospital Stay: Admit: 2019-07-14 | Discharge: 2019-07-14 | Payer: MEDICAID | Primary: Cardiovascular Disease

## 2019-07-14 DIAGNOSIS — F332 Major depressive disorder, recurrent severe without psychotic features: Secondary | ICD-10-CM

## 2019-07-14 DIAGNOSIS — F1210 Cannabis abuse, uncomplicated: Secondary | ICD-10-CM

## 2019-07-14 DIAGNOSIS — F3189 Other bipolar disorder: Secondary | ICD-10-CM

## 2019-07-14 DIAGNOSIS — R5383 Other fatigue: Secondary | ICD-10-CM

## 2019-07-14 DIAGNOSIS — F411 Generalized anxiety disorder: Secondary | ICD-10-CM

## 2019-07-14 LAB — LITHIUM LEVEL: LITHIUM LEVEL: 0.6 mmol/L (ref 0.6–1.2)

## 2019-07-14 NOTE — Telephone Encounter
Clinician received a call from visiting nurse Cleopatra Cedar [phone]; completed intake with patient will be seeing her once per week.  He reported he has some questions/confusion about patient's medication regiment specifically in relation to lithium 450 and whether or not to leave patient with Ambien as needed; he had concern for previous SI.  This Clinical research associate reported this was out of his scope of practice and also Clinical research associate was not necessarily clear on medication and will triage to RN to call him in the a.m. tomorrow.  Clinician requested some information on what the home look like, in relation to individuals living there and in general the condition of the environment.  No direct concerns noted.  Clinician gave minimal clinical information on patient and thanked him for his service.  Follow-up tomorrow with RN.Cindie Crumbly, LCSW5/5/20214:56 PM
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
## 2019-07-14 NOTE — Other
Group Session: ?Group Therapy?Group Start Time:???10:00 AM??????End Time:??11:00 AMFacilitators:??Cindie Crumbly, LCSW?VIDEO TELEHEALTH VISIT, PLATFORM OTHER THAN MYCHART: This clinician is conducting this visit at a site within our enrolled clinical location.Marland Kitchen ?For this visit the clinician and patient were present via interactive audio & video telecommunications system that permits real-time communications, via a platform other than MyChart.?Platforms other than MyChart may be used only during the COVID-19 crisis, when MyChart is not an option, and video is required for clinical reasons. ?Reason for choice of video platform other than MyChart: Currently [facility] has identified Zoom as the appropriate telehealth platform to facilitate IOP Group Therapy.?PLEASE NOTE: CONSENT FOR THIS VISIT IS DIFFERENT THAN FOR MYCHART VIDEO VISITS! ?Verbal consent must be obtained by the clinician or staff prior to starting visit.??Read the Following Statements to Video Visit Participants:????(Name of clinician) will be conducting your video visit today. Before we begin, we must obtain your consent for today's visit.Can you confirm your name, date of birth, and state where you (or your child, if applicable) are currently located?By consenting, you understand that today's visit will be conducted through video conferencing technology other than our usual secure service. While we do everything possible to ensure your privacy, we may not be able to guarantee the security of this particular platform. You understand that the visit will not be recorded, and that we will be documenting this visit in our electronic medical record, and that we may disclose records concerning this interaction to other clinicians. You understand that a video visit may limit our ability to diagnose your condition.??You understand that if you are experiencing a medical emergency, you will be directed to dial 9-1-1 as we are not able to connect you directly. You also understand if your condition changes after this visit and you need of further care, you will contact our office. Do you agree to this consent, knowing that you can change your decision at any time? Do you have any other questions before we proceed???Patient consent given for video visit: yes?State patient is located in: [place] At home?LocationThe clinician is appropriately licensed in the above state to provide care for this visit.??Session Focus? Connection between feelings, thoughts and behaviorsDevelop skills to manage issues and improve functioningStatus of symptoms / issuesTreatment goal and skill useDaily goal setting Number of Participants? 7 Treatment Modality? Group Therapy Interventions/Education? Assisted patient addressing stressful life situationsAssisted patient with discharge planningAssisted patient with strategies for addressing peer issuesAssisted patient with strategies for managing illness/symptomsAssisted patient with terminationEncouraged increased cooperation with othersEncouraged increased initiation in groupsEncouraged patient to increase treatment focusEncouraged patient to verbalize, rather than enactEncourages patient to provide constructive feedback to othersAssisted patient with strategies for addressing family issuesAssisted patient with strategies for addressing school issuesEncouraged acceptance, tolerance and respect for othersEncouraged appropriate expression of thoughts and feelingsEncouraged improved listening skillsEncouraged patient to request as-needed help solving problemsHelped decrease isolation/increase sense of belongingHelped identify feelings, thoughts and behaviors connectionHelped increase awareness of feelings, thoughts and behaviorsHelped patient acquire skills to build self-confidenceHelped patient identify strengthsHelped patient increase ability to gain support from othersHelped patient increase comfort with othersHelped patient increase interpersonal effectivenessHelped patient increase sense of acceptance by othersProvided assistance problem-solving challengesProvided positive reinforcement for successesProvided reinforcement for patient contributions to othersProvided reinforcement for use of adaptive coping skills ?Attendance???????????? Attended?Engagement InterventionsNot applicable - patient attended group without difficulty Degree of Participation? Active Behavior Alert, Cooperative, Guarded and Interactive Speech/Thought Process Coherent, Directed and Relevant Affect/Mood Anxious, Depressed and Irritable Insight Fair Judgment Moderate Response to Interventions? Attentive, Engaged, Interested and Receptive  Individualization? ?SUMMARY NOTE Group 1:During IOP Group Therapy via ZOOM Patient self-reported on a scale of 0 = None to 10= Highest; patient described their most concerning symptomatology as; depression 6 irritability 4 and 0 for all other assessed symptomatology.  Patient report was congruent with the patient's observed presentation.  Patient identifies things that went well/grateful for; schools almost on, my bald spot is going away, my mom said yes to letting me use her backyard for my son's birthday party.  Patient identified purposely working on the following skill; accepting a visiting nurse to help her with her medication, activation, patient question whether she should possibly switch to CBD as opposed to cannabis.  Clinician did not endorse either but encouraged patient to continue to work on discontinuing cannabis and having her psychiatric medication adjusted.  Patient reports when she stops smoking for almost the whole day she becomes highly irritable and this Clinical research associate discussed withdrawal effects as well as normalized patient's experience. Patient reports to be taking their medication as prescribed and reports no concerns noted at this time. Patient reports currently to not be experiencing any ideation, intent, or plans in reference to suicidal or homicidal symptomatology.? Plan? Continue to assist patient with skills to improve functionContinue to assist patient with skills to manage symptomsContinue to reinforce use of positive coping skillsContinue to review and assess symptoms / issuesContinue to review and assess treatment goals / skill useContinue per Plan of Care / Interdisciplinary Plan of Care ??? ? [place] - Suicide Severity Screen? ? Most Recent Value Have you wished you were dead or wished you could go to sleep and not wake up? ?No Have you actually had any thoughts of killing yourself?? ?No Have you been thinking about how you might kill yourself?  ?No Have you had these thoughts and had some intention of acting on them? ?No Have you started to work out or worked out the details of how to kill yourself?  ?No Have you ever done anything, started to do anything, or prepared to do anything to end your life?  ?No Was this in the past 3 months? ?No [place] Suicide Risk Level ?Low Risk ??Cindie Crumbly, LCSW5/5/20215:37 PM
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
## 2019-07-14 NOTE — Group Note
Group Session:  Group TherapyGroup Start Time:   11:10 AM      End Time:  12:00 PMFacilitators:  Sharece Fleischhacker, Shanda Bumps, NP; [place], Nicholas, PCTVIDEO TELEHEALTH VISIT, PLATFORM OTHER THAN MYCHART: This clinician is conducting this visit at a site within our enrolled clinical location..  For this visit the clinician and patient were present via interactive audio & video telecommunications system that permits real-time communications, via a platform other than MyChart.Platforms other than MyChart may be used only during the COVID-19 crisis, when MyChart is not an option, and video is required for clinical reasons. Reason for choice of video platform other than MyChart: Currently [facility] has identified Zoom as the appropriate telehealth platform to facilitate IOP Group Therapy.PLEASE NOTE: CONSENT FOR THIS VISIT IS DIFFERENT THAN FOR MYCHART VIDEO VISITS! Verbal consent must be obtained by the clinician or staff prior to starting visit.  Read the Following Statements to Video Visit Participants:  ?(Name of clinician) will be conducting your video visit today. Before we begin, we must obtain your consent for today's visit.Can you confirm your name, date of birth, and state where you (or your child, if applicable) are currently located?By consenting, you understand that today's visit will be conducted through video conferencing technology other than our usual secure service. While we do everything possible to ensure your privacy, we may not be able to guarantee the security of this particular platform. You understand that the visit will not be recorded, and that we will be documenting this visit in our electronic medical record, and that we may disclose records concerning this interaction to other clinicians. You understand that a video visit may limit our ability to diagnose your condition.  You understand that if you are experiencing a medical emergency, you will be directed to dial 9-1-1 as we are not able to connect you directly. You also understand if your condition changes after this visit and you need of further care, you will contact our office. Do you agree to this consent, knowing that you can change your decision at any time? Do you have any other questions before we proceed??Patient consent given for video visit: yesState patient is located in: [place] @ home address The clinician is appropriately licensed in the above state to provide care for this visit.Other individuals present during the telehealth encounter and their role/relation: Identified group facilitator(s), support staff as needed, and fellow group participants. Session Focus Connection between feelings, thoughts and behaviorsDevelop skills to manage issues and improve functioning Number of Participants 7 Treatment Modality Group Therapy Interventions/Education Assisted patient with strategies for managing illness/symptomsHelped increase awareness of feelings, thoughts and behaviorsProvided reinforcement for use of adaptive coping skills Attendance            AttendedEngagement InterventionsNot applicable - patient attended group without difficulty Degree of Participation Active Behavior Appropriate and Cooperative Speech/Thought Process Coherent and Organized Affect/Mood Anxious/Tired  Insight Good Judgment Good Response to Interventions Attentive, Engaged and Receptive Individualization Hadassa engaged in the mindful movement, mindful breathing, and mindful guided meditation group. She reported experiencing calmness after the group and gained awareness into how smoking impairs her breathing. She was cooperative and attentive throughout the group.  Plan Continue to assist patient with skills to improve functionContinue to assist patient with skills to manage symptoms

## 2019-07-15 ENCOUNTER — Inpatient Hospital Stay: Admit: 2019-07-15 | Discharge: 2019-07-15 | Payer: MEDICAID | Primary: Cardiovascular Disease

## 2019-07-15 ENCOUNTER — Ambulatory Visit: Admit: 2019-07-15 | Payer: PRIVATE HEALTH INSURANCE | Primary: Cardiovascular Disease

## 2019-07-15 DIAGNOSIS — F1210 Cannabis abuse, uncomplicated: Secondary | ICD-10-CM

## 2019-07-15 DIAGNOSIS — F3189 Other bipolar disorder: Secondary | ICD-10-CM

## 2019-07-15 DIAGNOSIS — F411 Generalized anxiety disorder: Secondary | ICD-10-CM

## 2019-07-15 LAB — WOUND CULTURE AND GRAM STAIN

## 2019-07-15 MED ORDER — VRAYLAR 3 MG CAPSULE
3 mg | ORAL_CAPSULE | Freq: Every day | ORAL | 1 refills | Status: AC
Start: 2019-07-15 — End: 2019-07-15

## 2019-07-15 MED ORDER — VRAYLAR 3 MG CAPSULE
3 mg | ORAL_CAPSULE | Freq: Every day | ORAL | 1 refills | Status: AC
Start: 2019-07-15 — End: 2019-07-30

## 2019-07-15 NOTE — Group Note
Group Session:  Group TherapyGroup Start Time:   11:10 AM      End Time:  12:00 PMFacilitators:  Cristino, Dorene Grebe, LCSW; [place], Janyth Pupa, PCT VIDEO TELEHEALTH VISIT, PLATFORM OTHER THAN MYCHART: This clinician is conducting this visit at a site within our enrolled clinical location..  For this visit the clinician and patient were present via interactive audio & video telecommunications system that permits real-time communications, via a platform other than MyChart.Platforms other than MyChart may be used only during the COVID-19 crisis, when MyChart is not an option, and video is required for clinical reasons. Reason for choice of video platform other than MyChart: Currently [facility] has identified Zoom as the appropriate telehealth platform to facilitate IOP Group Therapy.PLEASE NOTE: CONSENT FOR THIS VISIT IS DIFFERENT THAN FOR MYCHART VIDEO VISITS! Verbal consent must be obtained by the clinician or staff prior to starting visit.  Read the Following Statements to Video Visit Participants:  ?(Name of clinician) will be conducting your video visit today. Before we begin, we must obtain your consent for today's visit.Can you confirm your name, date of birth, and state where you (or your child, if applicable) are currently located?By consenting, you understand that today's visit will be conducted through video conferencing technology other than our usual secure service. While we do everything possible to ensure your privacy, we may not be able to guarantee the security of this particular platform. You understand that the visit will not be recorded, and that we will be documenting this visit in our electronic medical record, and that we may disclose records concerning this interaction to other clinicians. You understand that a video visit may limit our ability to diagnose your condition.  You understand that if you are experiencing a medical emergency, you will be directed to dial 9-1-1 as we are not able to connect you directly. You also understand if your condition changes after this visit and you need of further care, you will contact our office. Do you agree to this consent, knowing that you can change your decision at any time? Do you have any other questions before we proceed??Patient consent given for video visit: yes State patient is located in:  At [facility] clinician is appropriately licensed in the above state to provide care for this visit.Other individuals present during the telehealth encounter and their role/relation: Identified group facilitator(s), support staff as needed, and fellow group participants.Session Focus Connection between feelings, thoughts and behaviorsDevelop skills to manage issues and improve functioningStatus of symptoms / issuesTreatment goal and skill use Number of Participants 8 Treatment Modality Group Therapy Interventions/Education Assisted patient with strategies for managing illness/symptomsEncouraged increased cooperation with othersEncouraged increased initiation in groupsEncouraged patient to increase treatment focusEncourages patient to provide constructive feedback to othersEncouraged acceptance, tolerance and respect for othersEncouraged appropriate expression of thoughts and feelingsEncouraged improved listening skillsEncouraged patient to request as-needed help solving problemsHelped decrease isolation/increase sense of belongingHelped identify feelings, thoughts and behaviors connectionHelped increase awareness of feelings, thoughts and behaviorsHelped patient acquire skills to build self-confidenceHelped patient identify strengthsHelped patient increase ability to gain support from othersHelped patient increase comfort with othersHelped patient increase interpersonal effectivenessHelped patient increase sense of acceptance by othersProvided assistance problem-solving challengesProvided positive reinforcement for successesProvided reinforcement for patient contributions to othersProvided reinforcement for use of adaptive coping skills Attendance            AttendedEngagement InterventionsNot applicable - patient attended group without difficulty Degree of Participation Moderate Behavior Alert, Cooperative and Interactive Speech/Thought Process Coherent and Directed Affect/Mood Euthymic Insight Fair  Judgment Moderate and Fair Response to Interventions Attentive, Engaged, Interested and Receptive Individualization Group # 2 VisualizationPatient participated in a Visualization exercise. Patient appeared engaged during the guided visualization. When processing the activity, the patient reported she was able to mentally follow the directives and create the visualization of safe and peaceful environment/surroundings and of a future self.   Patient was vague about what she saw for her future self. She reported  she thought about son's future and How I can better it. She stated envisioned him (son) grown up with me.... not relying on anybody.  Plan Continue to assist patient with skills to improve functionContinue to assist patient with skills to manage symptomsContinue to reinforce use of positive coping skillsContinue to review and assess symptoms / issuesContinue to review and assess treatment goals / skill useContinue per Plan of Care / Interdisciplinary Plan of Care Eileen Croswell Cristino, LCSW5/6/20216:00 PM

## 2019-07-15 NOTE — Progress Notes
VIDEO TELEHEALTH VISIT, PLATFORM OTHER THAN MYCHART: This clinician is conducting this visit at a site within our enrolled clinical location..  For this visit the clinician and patient were present via interactive audio & video telecommunications system that permits real-time communications, via a platform other than MyChart.Platforms other than MyChart may be used only during the COVID-19 crisis, when MyChart is not an option, and video is required for clinical reasons. Reason for choice of video platform other than MyChart: Patient unable to access MyChart Video secondary to currently being in [employer] via [employer].PLEASE NOTE: CONSENT FOR THIS VISIT IS DIFFERENT THAN FOR MYCHART VIDEO VISITS! Verbal consent must be obtained by the clinician or staff prior to starting visit.  Read the Following Statements to Video Visit Participants:  ?(Name of clinician) will be conducting your video visit today. Before we begin, we must obtain your consent for today's visit.Can you confirm your name, date of birth, and state where you (or your child, if applicable) are currently located?By consenting, you understand that today's visit will be conducted through video conferencing technology other than our usual secure service. While we do everything possible to ensure your privacy, we may not be able to guarantee the security of this particular platform. You understand that the visit will not be recorded, and that we will be documenting this visit in our electronic medical record, and that we may disclose records concerning this interaction to other clinicians. You understand that a video visit may limit our ability to diagnose your condition.  You understand that if you are experiencing a medical emergency, you will be directed to dial 9-1-1 as we are not able to connect you directly. You also understand if your condition changes after this visit and you need of further care, you will contact our office. Do you agree to this consent, knowing that you can change your decision at any time? Do you have any other questions before we proceed??Patient consent given for video visit: yesState patient is located in: [place] @ home address The clinician is appropriately licensed in the above state to provide care for this visit.Other individuals present during the telehealth encounter and their role/relation: noneTotal time spent in medical video consultation (required if billing based on time): 30 minutes  Start Time: 10:20  End Time: 10:50[facility] [phone] [address]Bridge[address]  [id number] Number: [phone] Number: [phone] Outpatient Psychiatry MD/LIP Progress Note5/6/2021Start Time:  10:20      End Time:  10:50Session Type:  Medication ManagementAmanda Kazlauskas is a 24 y.o., Single, female.SUBJECTIVE Chief Complaint: I Interim History:  24 year old female with past psychiatric history of bipolar disrder (manic and depressive) and a history of suicide attempts and anxiety disorder referred to [facility] adult IOP by [facility] following an inpatient admission for worsening depression and anxiety symptoms at the beginning of March 2021. However, she finished a 1/2 of a group and then left the program. On Thursday, 06/17/2019, [name] called [facility] requesting a refill on her medication.  She reported feeling sick and very nauseated and lethargic. She was given a refill and started IOP on, 06/21/19. Aretta was last seen and started on vraylar for mood stability. In between sessions Maykayla experienced a significant stressor at home (requiring Stephenie to provide CPR for a man who was unconscious) in the middle of the night. Sheridyn reports experiencing more sadness and anxiety regarding her future. She reports having an urge to cut yesterday (the day after  the event) but did not cut. Nariya reports no suicidal thoughts at this time. She reports desire to find housing for herself and is considering finding a shelter. She reports a subjective depressed mood of 6/10 (10 being the worst) most of the time. She reports continuing to engage with home and get it all done. She reports anxiety when she is in public but otherwise is coping. Jolie reports using less cannabis and only one blunt a day. Plan to continue lithium 300mg  twice a day and repeat lithium level at the end of the week. Will start cross taper from seroquel to vraylar but will start vraylar first prior to tapering down seroquel. Follow up next week. Lithium level for 07/15/19: 0.6?Plan: Continue IOP Increase vraylar 3mg  daily Decrease seroquel 100mg  in the morning and night Continue lithium 300mg  in the morning and 300mg  nightly  Continue hydroxyzine 25mg  every four hours as needed for anxiety Continue zolpidem 5mg  every night as needed for sleep. ?REVIEW OF ALLERGIES/MEDICATIONS/HISTORY I have reviewed the patient's current medications and allergiesI have reviewed with the patient, the risks and benefits of the medications prescribed.OBJECTIVE Review of SystemsReview of Systems Psychiatric/Behavioral: Positive for dysphoric mood. The patient is nervous/anxious.  All other systems reviewed and are negative. LabsNo new labsCurrent MedicationsCurrent Outpatient Medications Medication Sig ? cariprazine 1.5 mg Cap Take 1 capsule (1.5 mg total) by mouth daily. ? lithium carbonate (LITHOBID CR) 300 mg CR extended release tablet Take 1 tablet (300 mg total) by mouth 2 (two) times daily. ? QUEtiapine (SEROQUEL) 100 mg Immediate Release tablet Take 1.5 tablets (150 mg total) by mouth 2 (two) times daily. ? zolpidem (AMBIEN) 5 mg tablet Take 1 tablet (5 mg total) by mouth nightly as needed for sleep. No current facility-administered medications for this encounter.  Mental Status ExamGeneral AppearanceHabitus:  MediumGrooming:  GoodMusculoskeletalStrength and Tone: Strength normalGait and Station: Stable gait and stable posturePsychiatricAttitude: Cooperative, good eye contact and pleasantPsychomotor Behavior: No psychomotor activation or retardationSpeech: normal rate, volume and prosodyMood: sad, discouraged   Patient reported mood:  I feel goodAffect: Congruent to reported moodThought Process: Coherent, logical, goal directedAssociations: NormalThought Content: Normal no auditory hallucinations, no command auditory hallucinations, no paranoid delusions, not perseverativeSuicidal Ideation: No current suicidal plan, ideation or intentHomicidal Ideation: No current homicidal ideation, plan or intentJudgment:  GoodInsight:  GoodCognitive EvaluationOrientation: Oriented to person, oriented to place and oriented to date/time Attention and Concentration:  Normal attention and concentrationMemory: Recent and remote memory intactLanguage:  Language intactFund of Knowledge: average.Abstract Reasoning: Normal capacity for abstract reasoningSafety and Risk AssessmentPSY RISK ASSESSMENT SAFE-T WITH C-SSRS C-SSRS: Suicidal Ideation:  Since Last Assessment  WISH TO BE DEAD:  No  CURRENT SUICIDAL THOUGHTS:  No  SUICIDAL THOUGHTS WITH METHOD:  No  SUICIDAL INTENT WITHOUT SPECIFIC PLAN:  No  INTENT WITH PLAN:  No  Have you ever done anything, started to do anything or prepared to do anything to end your life?:  Yes  Was it within the past 3 months?:  NoSuicidal Ideation Intensity :   FREQUENCY:  Once a week  DURATION:  Fleeting - few seconds or minutes  CONTROLLABILITY:  Easily able to control thoughts  DETERRENTS:  Deterrents definitely stopped you from attempting suicide  REASONS FOR IDEATION:  Does not applyRisk Assessment:   Access to Lethal Methods:  NoCurrent and Past Psychiatric Diagnoses:    Mood Disorder:  Recurrent/Current  Psychotic Disorder:  No  Alcohol/Substance Abuse Disorders:  Recurrent/Current  PTSD:  No  ADHD:  No  TBI:  No  Cluster B Personality Disorders or Traits:  No  Conduct Problems:  No  Suicide Attempt:  No Prior Attempts  Presenting Symptoms:  Anhedonia:  Yes  Impulsivity:  No  Hopelessness or Despair:  Yes  Anxiety and/or Panic:  No  Insomnia:  No  Command Hallucinations:  No  Psychosis:  No  Self-injurious Behavior:  No  Physical Aggression Toward Others:  No  Violence, Victimization and/or Perpetration:  No  Family History:   Suicide:  No  Suicidal Behavior:  No  Axis I Psychiatric Diagnosis requiring hospitalization:  No  Precipitants / Stressors:   Triggering events leading to humiliation, shame and/or despair:  No  Chronic physical pain or other acute medical problem:  No  Sexual / Physical Abuse:  No  Substance Intoxication or Withdrawal:  No  Pending Incarceration:  No  Legal Problems:  No  Inadequate Social Supports:  No  Social Isolation:  No  Perceived burden on others:  No  Bullying / Cyberbullying:  No  Media portrayal of suicide:  No  Housing Insecurity:  No  Financial Insecurity:  No  Stressful Life Events:  Yes  Gender / Sexual Identity:  No  Homelessness:  No  Change in Treatment:    Recent inpatient discharge:  No  Change in provider or treatment:  No  Hopeless or dissatisfied with provider or treatment:  No  Non-compliant:  Yes  Not receiving treatment:  NoCurrent and Past Psychiatric Diagnoses:  Mood Disorder:  Recurrent/Current  Psychotic Disorder:  No  Alcohol/Substance Abuse Disorders:  Recurrent/Current  PTSD:  No  ADHD:  No  TBI:  No  Cluster B Personality Disorders or Traits:  No  Conduct Problems:  No  Suicide Attempt:  No Prior AttemptsProtective Factors:   Internal:   Ability to cope with stress:  Yes  Frustration tolerance:  Yes  Fear of death or the actual act of killing self:  Yes  Identifies reasons for living:  Yes  Problem solving skills:  Yes  External:   Responsibility to children:  Yes  Beloved pets:  Yes  Supportive social network of friends or family:  Yes  Positive therapeutic relationships / Effective mental healthcare:  YesRisk to Self - Self-Injurious Behavior:   Current Urges to harm Self:  Yes  Recent Self-Injury:  No  History of Self Injury:  No  Imminent Risk for Self Injury in Community:  Low  Imminent Risk for Self Injury in Facility:  LowRisk to Others:   Current Agitation:  No  Homicidal/Aggressive Ideation:  No  Homicidal/Aggressive Threat/Plan:  No  Recent Violence/Aggression:  NoWithdrawal Risk:   Alcohol / Benzodiazepines / Barbiturates:  Not applicable     Alcohol/Benzodiazepine/Barbiturate Risk for Withdrawal:  Absent  Opioids:  Not applicable     Opioid Risk for Withdrawal:  AbsentMEDICAL DECISION MAKING DiagnosisProblem List         ICD-10-CM   OP/IOP/PHP Psychiatry  MDD (major depressive disorder), recurrent severe, 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 Nelsie reports no side effects from vraylar and is tolerating it well.  Her moods continue to feel depressed and anxious. Plan to continue cross taper from seroquel to vraylar. Will continue lithium 300mg  twice a day.  Will continue with IOP at this time. Currently, no imminent safety concerns. The Self-Report [place] Risk Suicide Severity Rating Scale Since Last Visit was completed by Voncille Lo and reviewed by myself today.  Additionally, risk assessment from [facility] intake assessment and follow up screenings have been reviewed.  Based on Arnette Neault?s clinical presentation today as well as her self-reported symptom ratings today and over the current course of treatment, I have assessed Brentney Goldbach to be at unchanged risk of harm to self as compared with prior recent assessments.  Based on this assessment, I have continued current treatment plan including implementation of factors to mitigate risks of harm to self as outlined in the last risk assessmentIntervention(s)Supportive therapy Labs/Test/Consults OrderedRepeat lithium Medication ChangesIncrease vraylar 3mg  daily Decrease seroquel 100mg  in the morning and night   PlanPlan: Continue IOP Increase vraylar 3mg  daily Decrease seroquel 100mg  in the morning and night Continue lithium 300mg  in the morning and 300mg  nightly  Continue hydroxyzine 25mg  every four hours as needed for anxiety Continue zolpidem 5mg  every night as needed for sleep. ?Discussed side effects, pros & cons of treatment, and alternative treatment options. Reviewed safety plan to call the clinic, the National Suicide Prevention Lifeline: (364)469-2993, or  911 to go to the ER if safety is at risk. Not deemed at imminent risk of harm to self/others at this timePatient verbalized understanding and had no additional questions. Electronically Signed:Tanaysha Alkins, NP 07/15/2019 10:52 AM

## 2019-07-15 NOTE — Other
Group Session:  Group Therapy Group Start Time:   12:10 PM      End Time:  1:00 PMFacilitators:  Cindie Crumbly, LCSWVIDEO TELEHEALTH VISIT, PLATFORM OTHER THAN MYCHART: This clinician is conducting this visit at a site within our enrolled clinical location.Marland Kitchen ?For this visit the clinician and patient were present via interactive audio & video telecommunications system that permits real-time communications, via a platform other than MyChart.?Platforms other than MyChart may be used only during the COVID-19 crisis, when MyChart is not an option, and video is required for clinical reasons. ?Reason for choice of video platform other than MyChart: Currently  [facility] has identified Zoom as the appropriate telehealth platform to facilitate IOP Group Therapy.?PLEASE NOTE: CONSENT FOR THIS VISIT IS DIFFERENT THAN FOR MYCHART VIDEO VISITS! ?Verbal consent must be obtained by the clinician or staff prior to starting visit.??Read the Following Statements to Video Visit Participants:????(Name of clinician) will be conducting your video visit today. Before we begin, we must obtain your consent for today's visit.Can you confirm your name, date of birth, and state where you (or your child, if applicable) are currently located?By consenting, you understand that today's visit will be conducted through video conferencing technology other than our usual secure service. While we do everything possible to ensure your privacy, we may not be able to guarantee the security of this particular platform. You understand that the visit will not be recorded, and that we will be documenting this visit in our electronic medical record, and that we may disclose records concerning this interaction to other clinicians. You understand that a video visit may limit our ability to diagnose your condition.??You understand that if you are experiencing a medical emergency, you will be directed to dial 9-1-1 as we are not able to connect you directly. You also understand if your condition changes after this visit and you need of further care, you will contact our office. Do you agree to this consent, knowing that you can change your decision at any time? Do you have any other questions before we proceed???Patient consent given for video visit: yes?State patient is located in: [place] At home?LocationThe clinician is appropriately licensed in the above state to provide care for this visit.??Session Focus? Connection between feelings, thoughts and behaviorsDevelop skills to manage issues and improve functioningStatus of symptoms / issuesTreatment goal and skill useDaily goal setting Number of Participants? 8 Treatment Modality? Group Therapy Interventions/Education? Assisted patient addressing stressful life situationsAssisted patient with discharge planningAssisted patient with strategies for addressing peer issuesAssisted patient with strategies for managing illness/symptomsAssisted patient with terminationEncouraged increased cooperation with othersEncouraged increased initiation in groupsEncouraged patient to increase treatment focusEncouraged patient to verbalize, rather than enactEncourages patient to provide constructive feedback to othersAssisted patient with strategies for addressing family issuesAssisted patient with strategies for addressing school issuesEncouraged acceptance, tolerance and respect for othersEncouraged appropriate expression of thoughts and feelingsEncouraged improved listening skillsEncouraged patient to request as-needed help solving problemsHelped decrease isolation/increase sense of belongingHelped identify feelings, thoughts and behaviors connectionHelped increase awareness of feelings, thoughts and behaviorsHelped patient acquire skills to build self-confidenceHelped patient identify strengthsHelped patient increase ability to gain support from othersHelped patient increase comfort with othersHelped patient increase interpersonal effectivenessHelped patient increase sense of acceptance by othersProvided assistance problem-solving challengesProvided positive reinforcement for successesProvided reinforcement for patient contributions to othersProvided reinforcement for use of adaptive coping skills ?Attendance???????????? AttendedEngagement InterventionsNot applicable - patient attended group without difficulty Degree of Participation? Active Behavior Alert, Cooperative, Guarded and Interactive Speech/Thought Process Coherent, Directed and Relevant Affect/Mood Anxious,  Depressed and Irritable Insight Fair Judgment Moderate Response to Interventions? Attentive, Engaged, Interested and Receptive Individualization? SUMMARY NOTE Group 3:During IOP Group Therapy via ZOOM Patients discussed weekend planning.  Clinician advised patient's when, how to use a safety plan, and individualized their safety plans.  Patient prompted to discuss any anticipated conflicts or concerns they had about their weekend and prompted patient is to give and receive feedback on how they may work on resolving or staying safe in dealing with them highlighting utilizing coping skills learned in IOP.  Specifically this patient reported ?going to the park with my son we walk daily and going to go to the craft store and make some mother's Day things, has some ideas.  Patient also reported her mother will be taking her son this evening so she will get to catch up household responsibilities like laundry and grocery shopping.  Patient also then talked about the joys of parenting her son and how it is therapeutic to play with him and do activities with them ?things I never did as a kid?.  Patient also discussed ?getting better at handling emotions as certain times I can hold back the urge to want to fight?.  Patient was given positive feedback and her actions and behaviors are supported by the medication but that she is doing a giving her ego support in credit in behavioral change.  Patient reports currently to not be experiencing any ideation, intent, or plans in reference to suicidal or homicidal symptomatology.? Plan? Continue to assist patient with skills to improve functionContinue to assist patient with skills to manage symptomsContinue to reinforce use of positive coping skillsContinue to review and assess symptoms / issuesContinue to review and assess treatment goals / skill useContinue per Plan of Care / Interdisciplinary Plan of Care ?Cindie Crumbly, LCSW5/6/20212:42 PM

## 2019-07-15 NOTE — Telephone Encounter
Writer was instructed by Bertram Denver APRN via inbox to update the medication orders with the visiting nurse Sylvester Harder RN. [phone]. Writer copied and pasted updated orders to this note and Reviewed  them with Tawanna Cooler RN(See following orders)Increase vraylar 3mg  daily Decrease seroquel 100mg  in the morning and night Continue lithium 300mg  in the morning and 300mg  nightly ? Continue hydroxyzine 25mg  every four hours as needed for anxiety Continue zolpidem 5mg  every night as needed for sleep.  Tawanna Cooler acknowledged understanding.Vance Gather, RN5/6/202111:15 AM

## 2019-07-15 NOTE — Other
Group Session: ?Group Therapy?Group Start Time:???10:00 AM??????End Time:??11:00 AMFacilitators:??Cindie Crumbly, LCSW?VIDEO TELEHEALTH VISIT, PLATFORM OTHER THAN MYCHART: This clinician is conducting this visit at a site within our enrolled clinical location.Marland Kitchen ?For this visit the clinician and patient were present via interactive audio & video telecommunications system that permits real-time communications, via a platform other than MyChart.?Platforms other than MyChart may be used only during the COVID-19 crisis, when MyChart is not an option, and video is required for clinical reasons. ?Reason for choice of video platform other than MyChart: Currently [facility] has identified Zoom as the appropriate telehealth platform to facilitate IOP Group Therapy.?PLEASE NOTE: CONSENT FOR THIS VISIT IS DIFFERENT THAN FOR MYCHART VIDEO VISITS! ?Verbal consent must be obtained by the clinician or staff prior to starting visit.??Read the Following Statements to Video Visit Participants:????(Name of clinician) will be conducting your video visit today. Before we begin, we must obtain your consent for today's visit.Can you confirm your name, date of birth, and state where you (or your child, if applicable) are currently located?By consenting, you understand that today's visit will be conducted through video conferencing technology other than our usual secure service. While we do everything possible to ensure your privacy, we may not be able to guarantee the security of this particular platform. You understand that the visit will not be recorded, and that we will be documenting this visit in our electronic medical record, and that we may disclose records concerning this interaction to other clinicians. You understand that a video visit may limit our ability to diagnose your condition.??You understand that if you are experiencing a medical emergency, you will be directed to dial 9-1-1 as we are not able to connect you directly. You also understand if your condition changes after this visit and you need of further care, you will contact our office. Do you agree to this consent, knowing that you can change your decision at any time? Do you have any other questions before we proceed???Patient consent given for video visit: yes?State patient is located in: [place] At home?LocationThe clinician is appropriately licensed in the above state to provide care for this visit.??Session Focus? Connection between feelings, thoughts and behaviorsDevelop skills to manage issues and improve functioningStatus of symptoms / issuesTreatment goal and skill useDaily goal setting Number of Participants? 8 Treatment Modality? Group Therapy Interventions/Education? Assisted patient addressing stressful life situationsAssisted patient with discharge planningAssisted patient with strategies for addressing peer issuesAssisted patient with strategies for managing illness/symptomsAssisted patient with terminationEncouraged increased cooperation with othersEncouraged increased initiation in groupsEncouraged patient to increase treatment focusEncouraged patient to verbalize, rather than enactEncourages patient to provide constructive feedback to othersAssisted patient with strategies for addressing family issuesAssisted patient with strategies for addressing school issuesEncouraged acceptance, tolerance and respect for othersEncouraged appropriate expression of thoughts and feelingsEncouraged improved listening skillsEncouraged patient to request as-needed help solving problemsHelped decrease isolation/increase sense of belongingHelped identify feelings, thoughts and behaviors connectionHelped increase awareness of feelings, thoughts and behaviorsHelped patient acquire skills to build self-confidenceHelped patient identify strengthsHelped patient increase ability to gain support from othersHelped patient increase comfort with othersHelped patient increase interpersonal effectivenessHelped patient increase sense of acceptance by othersProvided assistance problem-solving challengesProvided positive reinforcement for successesProvided reinforcement for patient contributions to othersProvided reinforcement for use of adaptive coping skills ?Attendance???????????? Partial attendance due to meeting with APRN and not return to group/technical difficulty.  Total minutes attended: 30Engagement InterventionsNot applicable - patient attended group without difficulty Degree of Participation? Active Behavior Alert, Cooperative, Guarded and Interactive Speech/Thought Process Coherent, Directed and Relevant  Affect/Mood Anxious, Depressed and Irritable Insight Fair Judgment Moderate Response to Interventions? Attentive, Engaged, Interested and Receptive Individualization? ?SUMMARY NOTE Group 1:During IOP Group Therapy via ZOOM Patient presented appropriately in the beginning of the group and after reporting no to the [place] suicide rating scale and stating that she is at her home location patient then saw APRN.  Patient did not return to group after meeting with APRN and this writer attempted to call patient and her phone presents with a busy signal.  Clinician unable to leave message or contact patient at this time. Patient reports currently to not be experiencing any ideation, intent, or plans in reference to suicidal or homicidal symptomatology.? Plan? Continue to assist patient with skills to improve functionContinue to assist patient with skills to manage symptomsContinue to reinforce use of positive coping skillsContinue to review and assess symptoms / issuesContinue to review and assess treatment goals / skill useContinue per Plan of Care / Interdisciplinary Plan of Care ??C-SSRS Since Last Visit [place] - Suicide Severity Screen    Most Recent Value Have you wished you were dead or wished you could go to sleep and not wake up?  No Have you actually had any thoughts of killing yourself?   No Have you been thinking about how you might kill yourself?   No [place] Suicide Risk Level  Low Risk ??Cindie Crumbly, LCSW5/6/20212:40 PM

## 2019-07-16 ENCOUNTER — Encounter (HOSPITAL_BASED_OUTPATIENT_CLINIC_OR_DEPARTMENT_OTHER): Payer: Self-pay | Admitting: Family Medicine

## 2019-07-19 ENCOUNTER — Encounter: Admit: 2019-07-19 | Payer: PRIVATE HEALTH INSURANCE | Attending: Psychiatry | Primary: Cardiovascular Disease

## 2019-07-19 ENCOUNTER — Ambulatory Visit: Admit: 2019-07-19 | Payer: PRIVATE HEALTH INSURANCE | Primary: Cardiovascular Disease

## 2019-07-19 ENCOUNTER — Inpatient Hospital Stay: Admit: 2019-07-19 | Discharge: 2019-07-19 | Payer: MEDICAID | Primary: Cardiovascular Disease

## 2019-07-19 DIAGNOSIS — F411 Generalized anxiety disorder: Secondary | ICD-10-CM

## 2019-07-19 DIAGNOSIS — R251 Tremor, unspecified: Secondary | ICD-10-CM

## 2019-07-19 DIAGNOSIS — F332 Major depressive disorder, recurrent severe without psychotic features: Secondary | ICD-10-CM

## 2019-07-19 DIAGNOSIS — F3189 Other bipolar disorder: Secondary | ICD-10-CM

## 2019-07-19 DIAGNOSIS — R5383 Other fatigue: Secondary | ICD-10-CM

## 2019-07-19 NOTE — Other
Patient was a no call no-show for IOP treatment this writer attempted to call patient later in the day to discuss her absence.  Patient's phone has an error message and reports that the call cannot be connected; some type pre recorded message.Cindie Crumbly, LCSW5/10/20214:56 PM

## 2019-07-19 NOTE — Telephone Encounter
Hi, This request should have gone to you and I thought it was recently addressed. Please let me know if there is anything that I can do.

## 2019-07-20 ENCOUNTER — Encounter: Admit: 2019-07-20 | Payer: PRIVATE HEALTH INSURANCE | Primary: Cardiovascular Disease

## 2019-07-20 MED ORDER — LITHIUM CARBONATE ER 300 MG TABLET,EXTENDED RELEASE
300 mg | ORAL_TABLET | Freq: Two times a day (BID) | ORAL | 1 refills | Status: AC
Start: 2019-07-20 — End: 2019-07-22

## 2019-07-21 ENCOUNTER — Inpatient Hospital Stay: Admit: 2019-07-21 | Discharge: 2019-07-21 | Payer: MEDICAID | Primary: Cardiovascular Disease

## 2019-07-21 ENCOUNTER — Ambulatory Visit: Admit: 2019-07-21 | Payer: PRIVATE HEALTH INSURANCE | Primary: Cardiovascular Disease

## 2019-07-21 DIAGNOSIS — F332 Major depressive disorder, recurrent severe without psychotic features: Secondary | ICD-10-CM

## 2019-07-21 DIAGNOSIS — F1210 Cannabis abuse, uncomplicated: Secondary | ICD-10-CM

## 2019-07-21 DIAGNOSIS — F411 Generalized anxiety disorder: Secondary | ICD-10-CM

## 2019-07-21 DIAGNOSIS — R251 Tremor, unspecified: Secondary | ICD-10-CM

## 2019-07-21 DIAGNOSIS — F3189 Other bipolar disorder: Secondary | ICD-10-CM

## 2019-07-21 NOTE — Other
Group Session: ?Group Therapy?Group Start Time:???10:00 AM??????End Time:??11:00 AMFacilitators:??Cindie Crumbly, LCSW?VIDEO TELEHEALTH VISIT, PLATFORM OTHER THAN MYCHART: This clinician is conducting this visit at a site within our enrolled clinical location.Marland Kitchen ?For this visit the clinician and patient were present via interactive audio & video telecommunications system that permits real-time communications, via a platform other than MyChart.?Platforms other than MyChart may be used only during the COVID-19 crisis, when MyChart is not an option, and video is required for clinical reasons. ?Reason for choice of video platform other than MyChart: Currently [facility] has identified Zoom as the appropriate telehealth platform to facilitate IOP Group Therapy.?PLEASE NOTE: CONSENT FOR THIS VISIT IS DIFFERENT THAN FOR MYCHART VIDEO VISITS! ?Verbal consent must be obtained by the clinician or staff prior to starting visit.??Read the Following Statements to Video Visit Participants:????(Name of clinician) will be conducting your video visit today. Before we begin, we must obtain your consent for today's visit.Can you confirm your name, date of birth, and state where you (or your child, if applicable) are currently located?By consenting, you understand that today's visit will be conducted through video conferencing technology other than our usual secure service. While we do everything possible to ensure your privacy, we may not be able to guarantee the security of this particular platform. You understand that the visit will not be recorded, and that we will be documenting this visit in our electronic medical record, and that we may disclose records concerning this interaction to other clinicians. You understand that a video visit may limit our ability to diagnose your condition.??You understand that if you are experiencing a medical emergency, you will be directed to dial 9-1-1 as we are not able to connect you directly. You also understand if your condition changes after this visit and you need of further care, you will contact our office. Do you agree to this consent, knowing that you can change your decision at any time? Do you have any other questions before we proceed???Patient consent given for video visit: yes?State patient is located in: [place] At home?LocationThe clinician is appropriately licensed in the above state to provide care for this visit.??Session Focus? Connection between feelings, thoughts and behaviorsDevelop skills to manage issues and improve functioningStatus of symptoms / issuesTreatment goal and skill useDaily goal setting Number of Participants? 8 Treatment Modality? Group Therapy Interventions/Education? Assisted patient addressing stressful life situationsAssisted patient with discharge planningAssisted patient with strategies for addressing peer issuesAssisted patient with strategies for managing illness/symptomsAssisted patient with terminationEncouraged increased cooperation with othersEncouraged increased initiation in groupsEncouraged patient to increase treatment focusEncouraged patient to verbalize, rather than enactEncourages patient to provide constructive feedback to othersAssisted patient with strategies for addressing family issuesAssisted patient with strategies for addressing school issuesEncouraged acceptance, tolerance and respect for othersEncouraged appropriate expression of thoughts and feelingsEncouraged improved listening skillsEncouraged patient to request as-needed help solving problemsHelped decrease isolation/increase sense of belongingHelped identify feelings, thoughts and behaviors connectionHelped increase awareness of feelings, thoughts and behaviorsHelped patient acquire skills to build self-confidenceHelped patient identify strengthsHelped patient increase ability to gain support from othersHelped patient increase comfort with othersHelped patient increase interpersonal effectivenessHelped patient increase sense of acceptance by othersProvided assistance problem-solving challengesProvided positive reinforcement for successesProvided reinforcement for patient contributions to othersProvided reinforcement for use of adaptive coping skills ?Attendance???????????? Attended?Engagement InterventionsNot applicable - patient attended group without difficulty Degree of Participation? Active Behavior Alert, Cooperative, and Interactive Speech/Thought Process Coherent, Directed and Relevant Affect/Mood Anxious, Depressed and Irritable Insight Moderate Judgment Moderate Response to Interventions? Attentive, Engaged, Interested and Receptive Individualization? ?  SUMMARY NOTE Group 1:Patient reported she missed Mondays treatment due to meeting her nurse, and then things got disorganized and had too much going on.  Clinician reported he must here from patient if she is not coming to IOP treatment to call and cancel or least follow-up with this Clinical research associate.  During IOP Group Therapy via ZOOM Patient self-reported on a scale of 0 = None to 10= Highest; patient described their most concerning symptomatology as; depression 5, irritability 5, rumination 3, racing thoughts 2, anxiety 2, sleep problems 4, drug cravings 6 and 0 for all other assessed symptomatology.  Patient report was congruent with the patient's observed presentation.  Patient reports goal for the week as:  ?Be more organized decrease clutter?.  Patient identifies things that went well/grateful for; my mom having my son's birthday party in her yard, homework is done for finals.  Patient identified purposely working on the following skill;  a motivated for my career decreasing the quantity of [place] marijuana smoke; smoking the same amount of times per day but less amount of marijuana in each sitting.  Patient also identified taking her time and connecting with her ex-boyfriend which she previously identified as toxic but is not questioning if her mental health had a large part to do with the dysfunction within the relationship.  Patient reports to be taking their medication as prescribed and reports no concerns noted at this time. Patient reports currently to not be experiencing any ideation, intent, or plans in reference to suicidal or homicidal symptomatology.? Plan? Continue to assist patient with skills to improve functionContinue to assist patient with skills to manage symptomsContinue to reinforce use of positive coping skillsContinue to review and assess symptoms / issuesContinue to review and assess treatment goals / skill useContinue per Plan of Care / Interdisciplinary Plan of Care ??C-SSRS Since Last Visit? ? [place] - Suicide Severity Screen? ? Most Recent Value Have you wished you were dead or wished you could go to sleep and not wake up? ?No Have you actually had any thoughts of killing yourself?  ?No Have you been thinking about how you might kill yourself?  ?No [place] Suicide Risk Level ?Low Risk ??Cindie Crumbly, LCSW5/12/20215:31 PM

## 2019-07-21 NOTE — Group Note
Group Session:  Group TherapyGroup Start Time:   12:10 AM      End Time:   1:00 PMFacilitators:  Cristino, Dorene Grebe, LCSW VIDEO TELEHEALTH VISIT, PLATFORM OTHER THAN MYCHART: This clinician is conducting this visit at a site within our enrolled clinical location..  For this visit the clinician and patient were present via interactive audio & video telecommunications system that permits real-time communications, via a platform other than MyChart.Platforms other than MyChart may be used only during the COVID-19 crisis, when MyChart is not an option, and video is required for clinical reasons. Reason for choice of video platform other than MyChart: Currently [facility] has identified Zoom as the appropriate telehealth platform to facilitate IOP Group Therapy.PLEASE NOTE: CONSENT FOR THIS VISIT IS DIFFERENT THAN FOR MYCHART VIDEO VISITS! Verbal consent must be obtained by the clinician or staff prior to starting visit.  Read the Following Statements to Video Visit Participants:  ?(Name of clinician) will be conducting your video visit today. Before we begin, we must obtain your consent for today's visit.Can you confirm your name, date of birth, and state where you (or your child, if applicable) are currently located?By consenting, you understand that today's visit will be conducted through video conferencing technology other than our usual secure service. While we do everything possible to ensure your privacy, we may not be able to guarantee the security of this particular platform. You understand that the visit will not be recorded, and that we will be documenting this visit in our electronic medical record, and that we may disclose records concerning this interaction to other clinicians. You understand that a video visit may limit our ability to diagnose your condition.  You understand that if you are experiencing a medical emergency, you will be directed to dial 9-1-1 as we are not able to connect you directly. You also understand if your condition changes after this visit and you need of further care, you will contact our office. Do you agree to this consent, knowing that you can change your decision at any time? Do you have any other questions before we proceed??Patient consent given for video visit: yes State patient is located in: [facility] clinician is appropriately licensed in the above state to provide care for this visit.Other individuals present during the telehealth encounter and their role/relation: Identified group facilitator(s), support staff as needed, and fellow group participants.Session Focus Connection between feelings, thoughts and behaviorsDevelop skills to manage issues and improve functioningStatus of symptoms / issuesTreatment goal and skill useDaily goal setting Number of Participants 8 Treatment Modality Group Therapy Interventions/Education Assisted patient addressing stressful life situationsAssisted patient with strategies for addressing peer issuesAssisted patient with strategies for managing illness/symptomsEncouraged increased cooperation with othersEncouraged increased initiation in groupsEncouraged patient to increase treatment focusEncouraged patient to verbalize, rather than enactEncourages patient to provide constructive feedback to othersAssisted patient with strategies for addressing family issuesAssisted patient with strategies for addressing school issuesEncouraged acceptance, tolerance and respect for othersEncouraged appropriate expression of thoughts and feelingsEncouraged improved listening skillsEncouraged patient to request as-needed help solving problemsHelped decrease isolation/increase sense of belongingHelped identify feelings, thoughts and behaviors connectionHelped increase awareness of feelings, thoughts and behaviorsHelped patient acquire skills to build self-confidenceHelped patient identify strengthsHelped patient increase ability to gain support from othersHelped patient increase comfort with othersHelped patient increase interpersonal effectivenessHelped patient increase sense of acceptance by othersProvided assistance problem-solving challengesProvided positive reinforcement for successesProvided reinforcement for patient contributions to othersProvided reinforcement for use of adaptive coping skills Attendance  AttendedEngagement InterventionsNot applicable - patient attended group without difficulty Degree of Participation Moderate Behavior Alert, Cooperative, Interactive and Passive Speech/Thought Process Coherent, Directed and Relevant Affect/Mood Euthymic Insight Fair Judgment Moderate and Fair Response to Interventions Attentive, Interested and Receptive Individualization Group #3This group offered education on internal sense of control. The handout was titled How to increase sense of control. The handout explained that having an internal senses of control, as opposed to feeling that external forces determine ones fate, can leading to feeling happier and less stressed. Group discussed how one can feel empowered by recognizes what one can control and distinguish between those factors we can control and those we cannot. The handout offered that to improve one?s locus of control and empower oneself, one can use the following: Be aware that you have a choice, Review your options, Ask for ideas (from those you trust), Choose what?s best for you, and Remember your choices.  Patient chose not to take a turn reading from the handout. Patient was attentive to others who read and shared.  Patient offered personal experience. Patient was able to identify something she has control over today and how she can exercise that control.   Plan Continue to assist patient with skills to improve functionContinue to assist patient with skills to manage symptomsContinue to reinforce use of positive coping skillsContinue to review and assess symptoms / issuesContinue to review and assess treatment goals / skill useContinue per Plan of Care / Interdisciplinary Plan of Care Zoe Scott Cristino, LCSW5/12/20213:14 PM
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
## 2019-07-22 ENCOUNTER — Inpatient Hospital Stay: Admit: 2019-07-22 | Discharge: 2019-07-22 | Payer: MEDICAID | Primary: Cardiovascular Disease

## 2019-07-22 ENCOUNTER — Encounter: Admit: 2019-07-22 | Payer: PRIVATE HEALTH INSURANCE | Attending: Clinical | Primary: Cardiovascular Disease

## 2019-07-22 ENCOUNTER — Ambulatory Visit: Admit: 2019-07-22 | Payer: PRIVATE HEALTH INSURANCE | Primary: Cardiovascular Disease

## 2019-07-22 DIAGNOSIS — F411 Generalized anxiety disorder: Secondary | ICD-10-CM

## 2019-07-22 DIAGNOSIS — F1210 Cannabis abuse, uncomplicated: Secondary | ICD-10-CM

## 2019-07-22 DIAGNOSIS — F332 Major depressive disorder, recurrent severe without psychotic features: Secondary | ICD-10-CM

## 2019-07-22 DIAGNOSIS — R5383 Other fatigue: Secondary | ICD-10-CM

## 2019-07-22 DIAGNOSIS — F3189 Other bipolar disorder: Secondary | ICD-10-CM

## 2019-07-22 MED ORDER — LITHIUM CARBONATE ER 300 MG TABLET,EXTENDED RELEASE
300 mg | ORAL_TABLET | 1 refills | Status: AC
Start: 2019-07-22 — End: 2019-07-29

## 2019-07-22 NOTE — Other
Group Session: ?Group Therapy?Group Start Time:???10:00 AM??????End Time:??11:00 AMFacilitators:??Cindie Crumbly, LCSW?VIDEO TELEHEALTH VISIT, PLATFORM OTHER THAN MYCHART: This clinician is conducting this visit at a site within our enrolled clinical location.Marland Kitchen ?For this visit the clinician and patient were present via interactive audio & video telecommunications system that permits real-time communications, via a platform other than MyChart.?Platforms other than MyChart may be used only during the COVID-19 crisis, when MyChart is not an option, and video is required for clinical reasons. ?Reason for choice of video platform other than MyChart: Currently [facility] has identified Zoom as the appropriate telehealth platform to facilitate IOP Group Therapy.?PLEASE NOTE: CONSENT FOR THIS VISIT IS DIFFERENT THAN FOR MYCHART VIDEO VISITS! ?Verbal consent must be obtained by the clinician or staff prior to starting visit.??Read the Following Statements to Video Visit Participants:????(Name of clinician) will be conducting your video visit today. Before we begin, we must obtain your consent for today's visit.Can you confirm your name, date of birth, and state where you (or your child, if applicable) are currently located?By consenting, you understand that today's visit will be conducted through video conferencing technology other than our usual secure service. While we do everything possible to ensure your privacy, we may not be able to guarantee the security of this particular platform. You understand that the visit will not be recorded, and that we will be documenting this visit in our electronic medical record, and that we may disclose records concerning this interaction to other clinicians. You understand that a video visit may limit our ability to diagnose your condition.??You understand that if you are experiencing a medical emergency, you will be directed to dial 9-1-1 as we are not able to connect you directly. You also understand if your condition changes after this visit and you need of further care, you will contact our office. Do you agree to this consent, knowing that you can change your decision at any time? Do you have any other questions before we proceed???Patient consent given for video visit: yes?State patient is located in: [place] At home?LocationThe clinician is appropriately licensed in the above state to provide care for this visit.??Session Focus? Connection between feelings, thoughts and behaviorsDevelop skills to manage issues and improve functioningStatus of symptoms / issuesTreatment goal and skill useDaily goal setting Number of Participants? 9 Treatment Modality? Group Therapy Interventions/Education? Assisted patient addressing stressful life situationsAssisted patient with discharge planningAssisted patient with strategies for addressing peer issuesAssisted patient with strategies for managing illness/symptomsAssisted patient with terminationEncouraged increased cooperation with othersEncouraged increased initiation in groupsEncouraged patient to increase treatment focusEncouraged patient to verbalize, rather than enactEncourages patient to provide constructive feedback to othersAssisted patient with strategies for addressing family issuesAssisted patient with strategies for addressing school issuesEncouraged acceptance, tolerance and respect for othersEncouraged appropriate expression of thoughts and feelingsEncouraged improved listening skillsEncouraged patient to request as-needed help solving problemsHelped decrease isolation/increase sense of belongingHelped identify feelings, thoughts and behaviors connectionHelped increase awareness of feelings, thoughts and behaviorsHelped patient acquire skills to build self-confidenceHelped patient identify strengthsHelped patient increase ability to gain support from othersHelped patient increase comfort with othersHelped patient increase interpersonal effectivenessHelped patient increase sense of acceptance by othersProvided assistance problem-solving challengesProvided positive reinforcement for successesProvided reinforcement for patient contributions to othersProvided reinforcement for use of adaptive coping skills ?Attendance???????????? Attended?Engagement InterventionsNot applicable - patient attended group without difficulty Degree of Participation? Active Behavior Alert, Cooperative, and Interactive Speech/Thought Process Coherent, Directed and Relevant Affect/Mood Anxious, Depressed and Irritable Insight Moderate Judgment Moderate Response to Interventions? Attentive, Engaged, Interested and Receptive Individualization?  SUMMARY NOTE Group 1:During IOP Group Therapy via ZOOM Patient self-reported on a scale of 0 = None to 10= Highest; patient described their most concerning symptomatology as; depression 5, irritability 4, rumination 3, racing thoughts 3, anxiety 3, sleep problems 3, drug cravings 5 and 0 for all other assessed symptomatology.  Patient report was congruent with the patient's observed presentation.  Patient reports goal for the week as:  Not fully achieved as she continues to think about organizing her space and decreasing clutter in her home.  Patient identifies things that went well/grateful for; family, car, my son.  Patient identified purposely working on the following skill; the weather, my son spending time with him in doing things. Patient reports to be taking their medication as prescribed and reports no concerns noted at this time.  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  Clinician encouraged patient to think about how much money she will save if she discontinues cannabis.  Patient reports currently to not be experiencing any ideation, intent, or plans in reference to suicidal or homicidal symptomatology.? Plan? Continue to assist patient with skills to improve functionContinue to assist patient with skills to manage symptomsContinue to reinforce use of positive coping skillsContinue to review and assess symptoms / issuesContinue to review and assess treatment goals / skill useContinue per Plan of Care / Interdisciplinary Plan of Care ??C-SSRS Since Last Visit? ? [place] - Suicide Severity Screen? ? Most Recent Value Have you wished you were dead or wished you could go to sleep and not wake up? ?No Have you actually had any thoughts of killing yourself?  ?No Have you been thinking about how you might kill yourself?  ?No [place] Suicide Risk Level ?Low Risk ??Cindie Crumbly, LCSW5/13/20216:16 PM

## 2019-07-22 NOTE — Telephone Encounter
Clinician called patient is it appears she logged out after the 2nd group meeting with the APRN and did not log back in for the 3rd group.  Clinician attempted to call patient to connect with her and discuss her attendance as it appears she did this in the past and patient's phone continues to go to a busy signal we were unable to leave a message.  This Clinical research associate sent a message via my chart for patient to call him directly.  By end of business day patient had not return call to this Clinical research associate.  Patient appears to not have an active phone at this time.Cindie Crumbly, LCSW5/13/20216:13 PM

## 2019-07-22 NOTE — Other
Patient did not return to IOP treatment after meeting with APRN.  Please see note for follow-up telephone communication with patient.Cindie Crumbly, LCSW5/13/20216:16 PM

## 2019-07-22 NOTE — Progress Notes
VIDEO TELEHEALTH VISIT, PLATFORM OTHER THAN MYCHART: This clinician is conducting this visit at a site within our enrolled clinical location..  For this visit the clinician and patient were present via interactive audio & video telecommunications system that permits real-time communications, via a platform other than MyChart.Platforms other than MyChart may be used only during the COVID-19 crisis, when MyChart is not an option, and video is required for clinical reasons. Reason for choice of video platform other than MyChart: Patient unable to access MyChart Video secondary to currently being in [employer] via [employer].PLEASE NOTE: CONSENT FOR THIS VISIT IS DIFFERENT THAN FOR MYCHART VIDEO VISITS! Verbal consent must be obtained by the clinician or staff prior to starting visit.  Read the Following Statements to Video Visit Participants:  ?(Name of clinician) will be conducting your video visit today. Before we begin, we must obtain your consent for today's visit.Can you confirm your name, date of birth, and state where you (or your child, if applicable) are currently located?By consenting, you understand that today's visit will be conducted through video conferencing technology other than our usual secure service. While we do everything possible to ensure your privacy, we may not be able to guarantee the security of this particular platform. You understand that the visit will not be recorded, and that we will be documenting this visit in our electronic medical record, and that we may disclose records concerning this interaction to other clinicians. You understand that a video visit may limit our ability to diagnose your condition.  You understand that if you are experiencing a medical emergency, you will be directed to dial 9-1-1 as we are not able to connect you directly. You also understand if your condition changes after this visit and you need of further care, you will contact our office. Do you agree to this consent, knowing that you can change your decision at any time? Do you have any other questions before we proceed??Patient consent given for video visit: yesState patient is located in: [place] @ home address The clinician is appropriately licensed in the above state to provide care for this visit.Other individuals present during the telehealth encounter and their role/relation: noneTotal time spent in medical video consultation (required if billing based on time): 17 minutes  Start Time: 11:40  End Time: 11:57[facility] [phone] [address] AvenueBridgeport [place] [address]BMWUX Number: [id number] Number: [phone] Outpatient Psychiatry MD/LIP Progress Note5/13/2021Start Time:  11:40      End Time:  11:57Session Type:  Medication ManagementAmanda Scott is a 24 y.o., Single, female.SUBJECTIVE Chief Complaint: I'm feeling a little more motivated. Interim History:  24 year old female with past psychiatric history of bipolar disrder (manic and depressive) and a history of suicide attempts and anxiety disorder referred to [facility] adult IOP by [facility] following an inpatient admission for worsening depression and anxiety symptoms at the beginning of March 2021. However, she finished a 1/2 of a group and then left the program. On Thursday, 06/17/2019, Zoe Scott called [facility] requesting a refill on her medication.  She reported feeling sick and very nauseated and lethargic. She was given a refill and started IOP on, 06/21/19. Zoe Scott was last seen and increased to vraylar 3mg  daily, continued lithium 300mg  twice a day, seroquel 100mg  every morning and night, and zolpidem 5mg  nightly. Zoe Scott reports doing pretty good and having more motivation. She reports, I want to do things but my body isn't letting me. She reports feeling fatigued in the  day and needing a nap. She reports a subjective depressed mood of 6/10 (10 being the worst) most of the time. She reports interest in activities but physically having a hard time doing them. Zoe Scott reports passive, fleeting suicidal thoughts that are easier to control (less intense and less frequent). Zoe Scott reports less irritability overall. She reports having urges to get a piercing and spend money she reports no pressured speech, no racing thoughts, no agitation, no increased sexual activity, no increased impulsive behaviors, no grandiosity. Zoe Scott reports sleeping up up to 7 hours of sleep per night. She reports a subjective anxiety rating of 5-7/10 (10 being the worst). She reports panic attacks have not been in a while. Plan: Continue IOPIncrease lithium 300mg  in the morning and 600mg  Decrease seroquel 100mg  at night Continue vraylar 3mg  (change to nightly) Continue zolpidem 5mg  nightly Continue hydroxyzine 25mg  every four hours as needed for anxiety ?REVIEW OF ALLERGIES/MEDICATIONS/HISTORY I have reviewed the patient's current medications and allergiesI have reviewed with the patient, the risks and benefits of the medications prescribed.OBJECTIVE Review of SystemsReview of Systems Psychiatric/Behavioral: Positive for dysphoric mood. The patient is nervous/anxious.  All other systems reviewed and are negative. LabsNo new labsCurrent MedicationsCurrent Outpatient Medications Medication Sig ? cariprazine (VRAYLAR) 3 mg Cap Take 1 capsule (3 mg total) by mouth daily. ? lithium carbonate (LITHOBID CR) 300 mg CR extended release tablet Take 1 tablet (300 mg total) by mouth 2 (two) times daily. ? QUEtiapine (SEROQUEL) 100 mg Immediate Release tablet Take 100 mg by mouth 2 (two) times daily. ? zolpidem (AMBIEN) 5 mg tablet Take 1 tablet (5 mg total) by mouth nightly as needed for sleep. No current facility-administered medications for this encounter.  Mental Status ExamGeneral AppearanceHabitus:  MediumGrooming:  GoodMusculoskeletalStrength and Tone: Strength normalGait and Station: Stable gait and stable posturePsychiatricAttitude: Cooperative, good eye contact and pleasantPsychomotor Behavior: No psychomotor activation or retardationSpeech: normal rate, volume and prosodyMood: Normal not discouraged   Patient reported mood:  I feel goodAffect: Congruent to reported moodThought Process: Coherent, logical, goal directedAssociations: NormalThought Content: Normal no auditory hallucinations, no command auditory hallucinations, no paranoid delusions, not perseverativeSuicidal Ideation: No current suicidal plan, ideation or intentHomicidal Ideation: No current homicidal ideation, plan or intentJudgment:  GoodInsight:  GoodCognitive EvaluationOrientation: Oriented to person, oriented to place and oriented to date/time Attention and Concentration:  Normal attention and concentrationMemory: Recent and remote memory intactLanguage:  Language intactFund of Knowledge: average.Abstract Reasoning: Normal capacity for abstract reasoningSafety and Risk AssessmentPSY RISK ASSESSMENT SAFE-T WITH C-SSRS C-SSRS: Suicidal Ideation:  Since Last Assessment  WISH TO BE DEAD:  Yes  CURRENT SUICIDAL THOUGHTS:  No  SUICIDAL THOUGHTS WITH METHOD:  No  SUICIDAL INTENT WITHOUT SPECIFIC PLAN:  No  INTENT WITH PLAN:  No  Have you ever done anything, started to do anything or prepared to do anything to end your life?:  Yes  Was it within the past 3 months?:  NoSuicidal Ideation Intensity :   FREQUENCY:  2-5 times a week  DURATION:  Fleeting - few seconds or minutes  CONTROLLABILITY:  Easily able to control thoughts  DETERRENTS:  Deterrents definitely stopped you from attempting suicide  REASONS FOR IDEATION:  Does not applyRisk Assessment:   Access to Lethal Methods:  NoCurrent and Past Psychiatric Diagnoses:    Mood Disorder:  Recurrent/Current  Psychotic Disorder:  No  Alcohol/Substance Abuse Disorders:  Recurrent/Current  PTSD:  No  ADHD:  No  TBI:  No  Cluster B Personality Disorders or Traits:  No  Conduct  Problems:  No  Suicide Attempt:  No Prior Attempts  Presenting Symptoms:  Anhedonia:  Yes  Impulsivity:  No  Hopelessness or Despair:  Yes  Anxiety and/or Panic:  No  Insomnia:  No  Command Hallucinations:  No  Psychosis:  No  Self-injurious Behavior:  No  Physical Aggression Toward Others:  No  Violence, Victimization and/or Perpetration:  No  Family History:   Suicide:  No  Suicidal Behavior:  No  Axis I Psychiatric Diagnosis requiring hospitalization:  No  Precipitants / Stressors:   Triggering events leading to humiliation, shame and/or despair:  No  Chronic physical pain or other acute medical problem:  No  Sexual / Physical Abuse:  No  Substance Intoxication or Withdrawal:  No  Pending Incarceration:  No  Legal Problems:  No  Inadequate Social Supports:  No  Social Isolation:  No  Perceived burden on others:  No  Bullying / Cyberbullying:  No  Media portrayal of suicide:  No  Housing Insecurity:  No  Financial Insecurity:  No  Stressful Life Events:  Yes  Gender / Sexual Identity:  No  Homelessness:  No  Change in Treatment:    Recent inpatient discharge:  No  Change in provider or treatment:  No  Hopeless or dissatisfied with provider or treatment:  No  Non-compliant:  Yes  Not receiving treatment:  NoCurrent and Past Psychiatric Diagnoses:  Mood Disorder:  Recurrent/Current  Psychotic Disorder:  No  Alcohol/Substance Abuse Disorders:  Recurrent/Current  PTSD:  No  ADHD:  No  TBI:  No  Cluster B Personality Disorders or Traits:  No  Conduct Problems:  No  Suicide Attempt:  No Prior AttemptsProtective Factors:   Internal:   Ability to cope with stress:  Yes  Frustration tolerance:  Yes  Fear of death or the actual act of killing self:  Yes  Identifies reasons for living:  Yes  Problem solving skills:  Yes  External:   Responsibility to children:  Yes  Beloved pets:  Yes  Supportive social network of friends or family:  Yes  Positive therapeutic relationships / Effective mental healthcare:  YesRisk to Self - Self-Injurious Behavior:   Current Urges to harm Self:  Yes  Recent Self-Injury:  No  History of Self Injury:  No  Imminent Risk for Self Injury in Community:  Low  Imminent Risk for Self Injury in Facility:  LowRisk to Others:   Current Agitation:  No  Homicidal/Aggressive Ideation:  No  Homicidal/Aggressive Threat/Plan:  No  Recent Violence/Aggression:  NoWithdrawal Risk:   Alcohol / Benzodiazepines / Barbiturates:  Not applicable     Alcohol/Benzodiazepine/Barbiturate Risk for Withdrawal:  Absent  Opioids:  Not applicable     Opioid Risk for Withdrawal:  AbsentMEDICAL DECISION MAKING DiagnosisProblem List         ICD-10-CM   OP/IOP/PHP Psychiatry  MDD (major depressive disorder), recurrent severe, 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 Patrecia reports taking all her medications as prescribed and feeling more motivated and having no suicidal ideation. Will continue with IOP at this time.  Currently, no imminent safety concerns. The Self-Report [place] Risk Suicide Severity Rating Scale Since Last Visit was completed by Voncille Lo and reviewed by myself today.  Additionally, risk assessment from [facility] intake assessment and follow up screenings have been reviewed.  Based on Lidia Mckown?s clinical presentation today as well as her self-reported symptom ratings today and over the current course of treatment, I have assessed Anuoluwapo Mefferd to be at unchanged risk of harm to self as compared with prior recent assessments.  Based on this assessment, I have continued current treatment plan including implementation of factors to mitigate risks of harm to self as outlined in the last risk assessmentIntervention(s)Supportive therapy Labs/Test/Consults OrderedRepeat lithium Medication ChangesIncrease vraylar 3mg  daily Decrease seroquel 100mg  in the morning and night   PlanContinue IOPIncrease lithium 300mg  in the morning and 600mg  Decrease seroquel 100mg  at night Continue vraylar 3mg  (change to nightly) Continue zolpidem 5mg  nightly Continue hydroxyzine 25mg  every four hours as needed for anxiety Discussed side effects, pros & cons of treatment, and alternative treatment options. Reviewed safety plan to call the clinic, the National Suicide Prevention Lifeline: 480-758-7274, or  911 to go to the ER if safety is at risk. Not deemed at imminent risk of harm to self/others at this timePatient verbalized understanding and had no additional questions. Electronically Signed:Nealy Karapetian, NP 07/22/2019 11:58 AM

## 2019-07-22 NOTE — Telephone Encounter
Writer called Michiel Cowboy LPN to discuss medication changes. Writer spoke with LPN at [id number] lithium 300mg  in the morning and 600mg  in the eveDecrease seroquel to 100mg  at night ( D/C am Seroquel).Lizette acknowledged understanding and reported she was on the way to the pt's house now. Vance Gather, RN5/13/202112:38 PM

## 2019-07-26 ENCOUNTER — Inpatient Hospital Stay: Admit: 2019-07-26 | Discharge: 2019-07-26 | Payer: MEDICAID | Primary: Cardiovascular Disease

## 2019-07-26 DIAGNOSIS — R5383 Other fatigue: Secondary | ICD-10-CM

## 2019-07-26 DIAGNOSIS — F411 Generalized anxiety disorder: Secondary | ICD-10-CM

## 2019-07-26 DIAGNOSIS — F3189 Other bipolar disorder: Secondary | ICD-10-CM

## 2019-07-26 NOTE — Other
Patient di not attend IOP treatment today. She did not call to cancel. [name], PCT called patient during first group but the phone had a busy signal and he was unable to leave a message.[name], LCSW5/17/20215:23 PM

## 2019-07-26 NOTE — Other
STAFF PRESENT:  Huntley Estelle, MD - Psychiatrist, Medical DirectorKatherine Bajda, APRN - Psychiatric Nurse PractitionerJess Orvill Coulthard, APRN - Psychiatric Nurse PractitionerJennifer Naughton, LCSW - Clinical CoordinatorNatalie Cristino, LCSW - Social WorkKaren [place], [place] - Social Celene Skeen, Charity fundraiser, BSN - Registered [employer], LCSW- Reimbursement SpecialistAttendance: Patient attending IOP level of care, Co-occurring Disorder/General Mental Health track, x 3 days weekly. Patient has attended 13 days. Patient's attendance has been inconsistent and she was a no show on 07/19/2019 and then left early 07/22/2019 after seeing APRN; patient's phone seems to be not working correctly often when this Clinical research associate calls he get pre reported messages/busy signal and unable to leave a voicemail.Progress towards goals: Patient has made minimal progress towards her treatment goals. Patient has continued to meet with Jess Aleck Locklin, APRN for ongoing medication evaluation and management as well as to discuss diagnostic impressions and specific treatment recommendations. Medication changes were made in response to Patient?s mood liability; lithium was increased and APRN will start cross taper from Seroquel to Vraylar.  Patient has accepted a visiting nurse 1 time daily for medication administration and assistance.  Patient continues to struggle with cannabis use cutting down from four blunts per day to two blunts per day; originally at 7 to 8 in addition to decreasing the amount of cannabis within each blunt per her report.  Patient reports she continues to maintain some routine in her day.  Patient does identify increase level of anxiety as she has decreases cannabis use.  Patient identifies alternative coping skills and has increased her level of physical activation going to the gym as many times as possible when I have a babysitter.  Patient identifies anxiety and mood instability as her most challenging symptoms.  Based on symptom rating scale patient identifies out of 10 being the worst and 0 being none depression 5, irritability 5, rumination 3, racing thoughts 2, anxiety 2, sleep problems 4, drug cravings 6. Patient's scores have decreased slightly since her last clinical rounds review.  Patient identifies challenges at times in balancing her life, childcare, completing college classes and finals but does appear to have brighter affect less irritability.Substance Use: Patient reporting active cannabis use at this time at this time.  Most recent urine toxicology screen conducted was positive for cannabis.  Patient appears to be taking urine toxicology screens inconsistently as her last toxicology screen was taken 07/13/2019. Patient's order is to complete 2 urine tox screens per week.New Treatment Goals: N/APlan:Continue in IOP, x3 days per week to monitor and support mood stability, and support and monitor sobriety. In addition to enhance knowledge and use of coping strategies, and continue to work toward mood stabilization. Continue treatment plan and stabilization including psycho education on diagnosis, medication management with psychiatric LIP. Safety plan reviewed regularly and patient is aware to call 911 or go to nearest ED if symptoms worsen. Behavioral and mental health needs that remain to be met include evaluation of medication trials and ongoing medication management, developing emotional regulation, increasing distress tolerance and setting-up recovery and relapse prevention support. Clinician meeting in an ongoing nature with patient 1:1 to review discharge planning, termination, and progress in treatment.-Patient is scheduled to meet with Jess Tanika Bracco, APRN today 07/26/19 for medication management follow-up regarding medication changes and titrations.Current Medication List:Lithium 300mg  in the morning and 600mg  Seroquel 100mg  at night Vraylar 3mg  (change to nightly) Zolpidem 5mg  nightly Hydroxyzine 25mg  every four hours as needed for anxiety ?Treatment Discussion Representation:Margarita Maryelizabeth Kaufmann, MD - Psychiatrist, Medical DirectorKatherine Grace Bushy, APRN - Psychiatric Nurse PractitionerJess  Dicy Smigel, APRN - Psychiatric Nurse PractitionerJennifer Naughton, LCSW - Clinical CoordinatorNatalie Cristino, LCSW - Social Edd Arbour, LCSW - Social WorkKaren [place], [place] - Social Celene Skeen, Charity fundraiser, BSN - Technical sales engineer, LCSW - Social WorkNatalie [place], [place] - Clinical Reimbursement SpecialistNick [place], [place] - Psych Tech [employer], LCSW5/17/202110:30 AM

## 2019-07-26 NOTE — Other
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  Cannabis abuse F12.10  Generalized anxiety disorder F41.1  R/O Bipolar DisorderLevel of Care/Treatment Track: [facility] IOP AdultFrequency: Three times a weekEstimated duration/length of stay in the program: 2 monthsTreatment Modalities: Pharmacological Management;Group Psychotherapy (individual and family therapy as needed.)Patient Strengths: expresses reasons to live/engage in productive activity;able to express feelings;able to define needs;able to engage others;able to live independently;good pre-morbid functioningPatient and/or Family Stated Short Term Personal Goal(s): Be more stable and Be a better [place].Patient and/or Family Stated Long Term Personal Goal(s): ?Being stable.  Having my own shelter and having a career.Goal of Treatment #1: Patient will present with stable mood and improved level of functioningGoal of Treatment #2: Patient will be able to identify and implement effective coping strategiesGoal of Treatment #3: Monitor response and manage psychiatric medicationsThe patient did participate in the development of this Treatment Plan.Active Multidisciplinary Problems: INTERDISCIPLINARY PROBLEM LISTAnxiety:  ActiveDescriptive Behaviors:  Patient is a 24 year old single female diagnosed with GAD.Patient's current symptoms were reviewed along with discussion related to treatment objectives, goals and interventions. Current data:  Patient reports she continues to able to maintain some routine in her day.  Patient does identify increase level of anxiety when she decreases cannabis use.  Patient identifies alternative coping skills and has increased her level of physical activation going to the gym as many times as possible when I have a babysitter.  Patient identifies anxiety and mood instability as her most challenging symptoms.  She states she is able to me her and her son's basic needs and some household tasks.Goal:  Patient identifies keeping routine with my son including feed my son, care for him, take him to the park, go to school, groceries, laundry. Patient identifies she will have more motivation to engage in activities each day.  Patient reports at baseline, I feel very motivated. and I am capable of anything. Patient also identifies experiencing decreased anxiety and depression would be a goal.  Patient states she would expect anxiety and depression to be reduced but not eliminated.  Patient reports at baseline, never a point when I experience 0 anxiety and depression, it is always there. She would like the intensity and frequency of the symptoms to be reduced.  Patient appears to be making moderate progress towards this goal and is also relying on her mood stabilizing.Patient scored her symptoms in daily check-in group with a rating scale of 0=none and 10 = highest.  Most recently depression 6, irritability 4, rumination 6, racing thoughts 3, anxiety 6, sleep problems 6, drug cravings 5.Patient identified her long-term goal as:  Being stable.  Having my own shelter and having a career.Patient Stated Goal(s):   Be more stable and Be a better [place].Long Term Goal(s):  Demonstrated use of positive/appropriate coping mechanisms and Prevent recurrence/exacerbation of symptoms     Target Date:  6/15/21Short Term Goal/Objective:  Patient will report/demonstrate a decrease in frequency and intensity of anxiety as evidenced by? patient self-report of anxiety symptom rating being at 2 or lower using a scale in which 0=none and 1 0=highest.      Target Date:  08/20/2019     Status:  Partially achievedShort Term Goal/Objective:  Patient will return to baseline level of functioning as evidenced by? staff observation and patient self-report of being motivated to engage in daily responsiblities like feed  my son, care for him, take him to the park, go to school, groceries, laundry.     Target Date:  08/20/2019     Status:  Achieved and ongoingIntervention/Frequency:  Provide counseling and emotional support during IOP therapeutic groups, 3 days per week, 9 groups per week, 50 minutes each group, and individual and family therapy as needed.Intervention/Frequency:  Facilitate goal setting during group therapy, 3 groups per week, minimum of 50 minutes each group.Intervention/Frequency:  Encourage identification/practice/mastery of adaptive coping skills during CBT and/or DBT 2 groups per week, 50 minutes each group.Intervention/Frequency:  Encourage processing and discussion of the thoughts, feelings and Behaviors reported in daily check in session  During Group Therapy sessions, 3 per week, 50 minutes each.Intervention/Frequency:  Psychiatric Evaluation and/or medication management with LIP to review diagnostic impressions and specific treatment recommendations during bi-weekly, 15-30 minutes, or as needed via in-person, video and/or telephone sessionMood Alteration:  ActiveDescriptive Behaviors:  Patient is a 24 year old single female diagnosed with MDD recurrent severe, without psychosis.Patient's current symptoms were reviewed along with discussion related to treatment objectives, goals and interventions.  Patient identifies a more stable mood since attending IOP treatment at starting psychiatric medication; Vraylar, Seroquel, lithium, Ambien as needed is patient's current medication regiment.  Patient is ongoing medication titration schedule and also has accepted a visiting nurse daily to help her coordinate medication administration and the changes that are ongoing with medication dosages.  Patient also identifies some mood instability when decreasing cannabis use but is ongoing in her goal of discontinuing cannabis.Goal:  Patient identifies keeping routine with my son including feed my son, care for him, take him to the park, go to school, groceries, laundry. Patient identifies she will have more motivation to engage in activities each day.  Patient reports at baseline, I feel very motivated. and I am capable of anything. Patient also identifies experiencing decreased anxiety and depression would be a goal.  Patient states she would expect anxiety and depression to be reduced but not eliminated.  Patient reports at baseline, never a point when I experience 0 anxiety and depression, it is always there. She would like the intensity and frequency of the symptoms to be reduced.Patient scored her symptoms in daily check-in group with a rating scale of 0=none and 10 = highest.  Most recently depression 6, irritability 4, rumination 6, racing thoughts 3, anxiety 6, sleep problems 6, drug cravings 5.Patient identified her long-term goal as:  Being stable.  Having my own shelter and having a career. Patient Stated Goal(s):  : Be more stable and Be a better [place].Long Term Goal(s):  Demonstration of mood stability, Prevent recurrence/exacerbation of symptoms and Demonstrated use of positive/appropriate coping mechanisms     Target Date:  6/15/21Short Term Goal/Objective:  Patient will report/demonstrate a decrease in severity and/or frequency of mood alteration as evidenced by ... Patient self-report of depression rating being at a 3 or lower using a scale in which 0=none and 10=highest.      Target Date: 08/20/2019     Status:  Minimal progressShort Term Goal/Objective:  Patient will return to baseline level of functioning as evidenced by? staff observation and patient self-report of being motivated to engage in daily responsiblities like feed my son, care for him, take him to the park, go to school, groceries, laundry.     Target Date: 08/20/2019     Status:  Partially achievedShort Term Goal/Objective:  Patient will identify/practice/demonstrate the use of adaptive coping skills as evidenced by ... Staff observation  and patient self-report of being able to utilize 2 or more adaptive coping strategies daily like discuss with family/friend, exercise, activities, praying, using therapy     Target Date: 08/20/2019     Status:  Mostly achievedIntervention/Frequency:  Provide counseling and emotional support during IOP therapeutic groups, 3 days per week, 9 groups per week, 50 minutes each group, and individual and family therapy as needed.Intervention/Frequency:  Monitor for/address symptoms of altered mood during group therapy, 3 groups per week, minimum of 50 minutes each group.Intervention/Frequency:  Encourage identification/practice/mastery of adaptive coping skills during CBT and/or DBT 2 groups per week, 50 minutes each group.Intervention/Frequency:  Encourage processing and discussion of the thoughts, feelings and Behaviors reported in daily check in session  During Group Therapy sessions, 3 per week, 50 minutes each.Intervention/Frequency:  Psychiatric Evaluation and/or medication management with LIP to review diagnostic impressions and specific treatment recommendations during bi-weekly, 15-30 minutes, or as needed via in-person, video and/or telephone sessionSubstance Use / Abuse:  ActiveDescriptive Behaviors:  Update: Patient has transitioned well into the Co-occurring disorder group.  This was request by patient as she felt this better met her needs and addressed part of her presenting problem of cannabis use.  Patient reports to significant decreasing cannabis use from 7 to 8 marijuana blunts per day down to 2 to 4 per day.  Patient does report that her cannabis use amount is contingent upon her mood which is also unstable at this time. Patient is attempting to develop alternative a replacement coping skills but at times has difficulty with the amount of stressors in her life.  Patient previously reported to smoke 100 dollars a week in cannabis for blunts a day of high quality cannabis.  Patient reports this is an increased amount from when she 1st initiated and reported in IOP treatment.  Per recent meeting with APRN; She reports using cannabis more often over the weekend, up to three times a day. Patient reports she started at 24 years of age but started smoking daily from 24 to 40 years of age.  She reported she stop smoking for 1 year due to the birth of her child.  Patient reports that 90 % of her friends are a negative influence that she has drug cravings that are part of her routine to sleep.  Patient reports she smokes why her son is asleep and prior to bed.  Patient reports that hearing other people's stories and other people struggles with this same issue is helpful to her and makes her grateful that she is not have a worse issue in reference to substance use but feels this is motivating in working on her substance abuse issue.  Patient is completing toxicology test 2 [facility] per week [facility], and specifically decrease cannabis use towards abstinence as the goal.  Patient reports specifically her goal is to have a negative test before leaving.Patient Stated Goal(s):  Be more stable and Be a better [place].Long Term Goal(s):  Re-establish sobriety, identify connection between psychiatric symptoms and substance abuse and develop an active relapse prevention plan and Demonstrated use of positive/appropriate coping mechanisms     Target Date:  6/15/21Short Term Goal/Objective:  Patient will identify/practice/demonstrate the use of adaptive coping skills as evidenced by ? staff observation and patient self-report of being able to utilize 2 or more adaptive coping strategies daily like discuss with family/friend, exercise, activities, praying, using therapy     Target Date: 08/20/2019     Status:  Mostly achievedShort Term Goal/Objective:  Patient  will report a decrease in substance use as evidenced by ? Patient self-report and negative toxicology screening weekly.      Target Date: 08/20/2019     Status:  Partially achievedIntervention/Frequency:  Facilitate goal setting during group therapy, 3 groups per week, minimum of 50 minutes each group.Intervention/Frequency:  Encourage processing and discussion of the thoughts, feelings and Behaviors reported in daily check in session  During Group Therapy sessions, 3 per week, 50 minutes each.Intervention/Frequency:  Psychiatric Evaluation and/or medication management with LIP to review diagnostic impressions and specific treatment recommendations during bi-weekly, 15-30 minutes, or as needed via in-person, video and/or telephone sessionSuicide Risk:  ActiveDescriptive Behaviors:  Gabriana Wilmott was screened using the [place] Suicide Severity Rating Scale on intake.  Based on this screening a suicide risk assessment using clinical judgement and evidence-based guidelines was completed to comprehensively consider her acute and chronic risks of harm to self and others.  At this time, my assessment is that the patient is at high baseline chronic risk for harm to self.  Maurissa Kalis's acute risk is assessed to be low.  Concerning acute risk factors include history of SA, depressed mood, psychosocial factors (pandemic).  The patient has some protective factors which diminish risk including ability to cope with stress, religious beliefs, fear of death, reasons for living, problem solving skills and engaged in school. Based on this assessment the patient is felt to be appropriate for IOP level of care during which the treatment team will seek to further mitigate risk of harm to self via psychotropic medication prescription, engagement in intensive group therapy to develop and practice skills to help reduce distress, close clinical follow up with frequent reassessments of safety and treatment response, providing resources for crisis intervention supports including 211, the National Suicide Prevention Lifeline, [facility] adult IOP #, taking steps to remove or reduce access to means of self-harm or harm to others, providing relapse prevention support to reduce risks from drug or alcohol use, supporting patient in developing a plan to manage acute stressors, analysis of chain of events leading to and consequences of current suicidal/self-injurious/homicidal ideation and behaviors, identifying and then working with patient to remove or remediate prompting events for suicide, self-harm or violence, identifying supports and plan for patient to contact supports when in crisis, helping patient to generate feelings of hope and reasons for living, challenging maladaptive beliefs related to suicide, self injury or violence, establishing positive rapport with provider, emphatically telling patient not to commit suicide, self-injury or violence and developing or reviewing existing crisis plan.  Patient endorses being able to utilize safety plan that she developed with REACH clinician. Since initiating IOP treatment patient has had several incidences of reporting passive suicide ideation however her [place] suicide severity rating Scale has not changed in risk assessment level.  Patient reports to these coping skills to actively disengage this type of thinking and reports them to be passive, momentary, quickly redirect herself.Patient Stated Goal(s):  : Be more stable and Be a better [place].Long Term Goal(s):  Re-establish safety to self and others and Prevent recurrence/exacerbation of symptoms     Target Date:  6/15/21Short Term Goal/Objective:  Patient will comply with prescribed treatment and safety planning as evidenced by. . . Patient actively engage in productive conversations daily and/or as needed around suicide risk factors as well as protective factors.Discussions could include the following: specific warning signs that a crisis may be developing,internal coping strategies,people and social settings that provide a distraction,people whom the patient can directly ask for  help,professionals or agencies the patient can contact during a crisis; including 211 and 911.And making the patient's immediate environment safe.     Target Date: 08/20/2019     Status:  Achieved and ongoingShort Term Goal/Objective:  Patient will express feelings and needs to others as evidenced by. . . Staff observation and patient self-report of verbalizing stressors and asking for help daily or as needed.     Target Date:  08/20/2019     Status:  Achieved and ongoingShort Term Goal/Objective:  Patient will identify/practice/demonstrate the use of adaptive coping skills as evidenced by. . . Staff observation and patient self-report of being able to utilize 2 or more adaptive coping strategies daily like discuss with family/friend, exercise, activities, praying, using therapy     Target Date:  08/20/2019     Status:  Mostly achievedShort Term Goal/Objective:  Patient will refrain from acting on self-destructive impulses as evidenced by. . . Staff observation and patient self-report of no impulsive and/or self-destructive/self-injurious behavior daily including substance use.     Target Date:  08/20/2019     Status:  Achieved and ongoingIntervention/Frequency:  Provide safe environment during IOP therapeutic groups, 3 days per week, 9 groups per week, 50 minutes each group, and individual and family therapy as needed.Intervention/Frequency:  Encourage identification/practice/mastery of adaptive coping skills during CBT and/or DBT 2 groups per week, 50 minutes each group.Intervention/Frequency:  Encourage processing and discussion of the thoughts, feelings and Behaviors reported in daily check in session  During Group Therapy sessions, 3 per week, 50 minutes each.Intervention/Frequency:  Psychiatric Evaluation and/or medication management with LIP to review diagnostic impressions and specific treatment recommendations during bi-weekly, 15-30 minutes, or as needed via in-person, video and/or telephone sessionOther Assessed Need(s) (Medical/Psychosocial)(List any other assessed needs that were identified in the assessment of the patient that are NOT included in the Interdisciplinary Problem List noted above.  Indicate what the INTERVENTION, REFERRAL or DEFERRAL will be for each of the other Assessed Needs identified (e.g. name of PCP, other medical provider, therapist, community resource, etc.)No other assessed need identifiedAny and All medical conditions noted in the patient?s Problem List in the EMR have been reviewed by the REACH MD/APRN and referred/deferred to the appropriate LIP for care.Other assessed Psychosocial Needs were not reported during time of Intake Evaluation and co-construction of ITP development.  Multidisciplinary Team will continue to provide ongoing assessment and make recommendations and or referrals as needed.Medical / Psychological Assessments: Medications:Assessment of medication side effects and/or adverse medication reactions is ongoing     Current Outpatient Medications Medication Sig ? cariprazine (VRAYLAR) 3 mg Cap Take 1 capsule (3 mg total) by mouth daily. ? lithium carbonate (LITHOBID CR) 300 mg CR extended release tablet Take 1 tablet (300 mg total) by mouth 2 (two) times daily. ? QUEtiapine (SEROQUEL) 100 mg Immediate Release tablet Take 100 mg by mouth 2 (two) times daily. ? zolpidem (AMBIEN) 5 mg tablet Take 1 tablet (5 mg total) by mouth nightly as needed for sleep. No current facility-administered medications for this visit.  Consult / Referral Made:No Consults or Referrals made at this timeNo other assessed needs at this time. Clinician will continue to evaluate and assess other needs throughout this course of treatment.Discharge Planning: Anticipated Discharge Date: 6-8 weeks from date of admissionAnticipated Discharge Level of Care/ Disposition:Outpatient TherapyOutpatient Medication ManagementPlan: Patient admitted to IOP level of care on 06/21/19 General Mental Health Treatment Track on June 29, 2019 patient transferred to Co-occurring Track  3 days  per week for  further assessment, evaluation, medication management and group therapy to stabilize mood and to support and monitor patient's progress. In therapeutic group environment, patient provided with opportunity to acquire specific support, knowledge, skills, strategies and techniques to address presenting concerns. Individual and family therapy offered as needed. In light of COVID-19 precautions and to safely continue to provide treatment, IOP services are being provided via Zoom video and MyChart video sessions. In-person IOP and/or individual sessions will be contingent upon COVID-19 regulations and the [facility] ambulatory psychiatry operating procedures.   Co-construction of discharge plan ongoing.Cindie Crumbly, LCSW5/13/20218:02 AM

## 2019-07-27 ENCOUNTER — Encounter: Admit: 2019-07-27 | Payer: PRIVATE HEALTH INSURANCE | Primary: Cardiovascular Disease

## 2019-07-27 NOTE — Progress Notes
Writer was called by and spoke with pt's visiting nurse Michiel Cowboy LPN. Lizette LPN reported that during am visit today pt reported feeling manic, hyper and not sleeping X 2 days. Then LPN informed writer pt said sheslept 6 hours during the day yesterday. When writer inquired Lizette  LPN reported pt did not appear to be hyper and there are nocurrent safety issues. Pt informed Lizette LPN that yesterday she had her nose pierced. Writer hung up and reported above to [employer] and Dr. Maryelizabeth Kaufmann at 1pm. Then writer callled [employer] LPN back and informed her that pt did not show up for program yesterday and she was scheduled to see provider. Pt left IOP early last Thursday and staff made multiple attempts to contact pt by phone last week and today. Writer asked lizette to call pt and instruct her to obtain a LL tomorrow 07/28/2019 am before administering am medication. Writer asked Lizette  LPN to visit about 915/930am tomorrow and instruct pt to attend IOP via tele health. Lpn was instructed to inform pt that pt is not complying with IOP guidelines and there is a possibility of discharge from program if this behavior continues. Lizette LPN reported that she will call pt and instruct pt regarding above information. Writer thanked LPN for the update.5/18/20212:02 PM Vance Gather, RN

## 2019-07-28 ENCOUNTER — Inpatient Hospital Stay: Admit: 2019-07-28 | Discharge: 2019-07-28 | Payer: MEDICAID | Primary: Cardiovascular Disease

## 2019-07-28 DIAGNOSIS — R5383 Other fatigue: Secondary | ICD-10-CM

## 2019-07-28 DIAGNOSIS — F332 Major depressive disorder, recurrent severe without psychotic features: Secondary | ICD-10-CM

## 2019-07-28 DIAGNOSIS — F1210 Cannabis abuse, uncomplicated: Secondary | ICD-10-CM

## 2019-07-28 NOTE — Group Note
Group Session:  Group TherapyGroup Start Time:   10:00 AM      End Time:  11:00 AMFacilitators:  [name], LCSW VIDEO TELEHEALTH VISIT, PLATFORM OTHER THAN MYCHART: This clinician is conducting this visit at a site within our enrolled clinical location..  For this visit the clinician and patient were present via interactive audio & video telecommunications system that permits real-time communications, via a platform other than MyChart.Platforms other than MyChart may be used only during the COVID-19 crisis, when MyChart is not an option, and video is required for clinical reasons. Reason for choice of video platform other than MyChart: Currently [facility] has identified Zoom as the appropriate telehealth platform to facilitate IOP Group Therapy.PLEASE NOTE: CONSENT FOR THIS VISIT IS DIFFERENT THAN FOR MYCHART VIDEO VISITS! Verbal consent must be obtained by the clinician or staff prior to starting visit.  Read the Following Statements to Video Visit Participants:  ?(Name of clinician) will be conducting your video visit today. Before we begin, we must obtain your consent for today's visit.Can you confirm your name, date of birth, and state where you (or your child, if applicable) are currently located?By consenting, you understand that today's visit will be conducted through video conferencing technology other than our usual secure service. While we do everything possible to ensure your privacy, we may not be able to guarantee the security of this particular platform. You understand that the visit will not be recorded, and that we will be documenting this visit in our electronic medical record, and that we may disclose records concerning this interaction to other clinicians. You understand that a video visit may limit our ability to diagnose your condition.  You understand that if you are experiencing a medical emergency, you will be directed to dial 9-1-1 as we are not able to connect you directly. You also understand if your condition changes after this visit and you need of further care, you will contact our office. Do you agree to this consent, knowing that you can change your decision at any time? Do you have any other questions before we proceed??Patient consent given for video visit: yesState patient is located in: [place] [facility] clinician is appropriately licensed in the above state to provide care for this visit.Other individuals present during the telehealth encounter and their role/relation: Identified group facilitator(s), support staff as needed, and fellow group participants.Session Focus Connection between feelings, thoughts and behaviorsDevelop skills to manage issues and improve functioningStatus of symptoms / issuesTreatment goal and skill useDaily goal setting Number of Participants 6 Treatment Modality Group Therapy Interventions/Education Assisted patient addressing stressful life situationsAssisted patient with strategies for addressing peer issuesAssisted patient with strategies for managing illness/symptomsEncouraged increased cooperation with othersEncouraged increased initiation in groupsEncouraged patient to increase treatment focusEncouraged patient to verbalize, rather than enactEncourages patient to provide constructive feedback to othersAssisted patient with strategies for addressing family issuesAssisted patient with strategies for addressing school issuesEncouraged acceptance, tolerance and respect for othersEncouraged appropriate expression of thoughts and feelingsEncouraged improved listening skillsEncouraged patient to request as-needed help solving problemsHelped decrease isolation/increase sense of belongingHelped identify feelings, thoughts and behaviors connectionHelped increase awareness of feelings, thoughts and behaviorsHelped patient acquire skills to build self-confidenceHelped patient identify strengthsHelped patient increase ability to gain support from othersHelped patient increase comfort with othersHelped patient increase interpersonal effectivenessHelped patient increase sense of acceptance by othersProvided assistance problem-solving challengesProvided positive reinforcement for successesProvided reinforcement for patient contributions to othersProvided reinforcement for use of adaptive coping skills Attendance  AttendedEngagement InterventionsNot applicable - patient attended group without difficulty Degree of Participation Active Behavior Alert, Cooperative, Interactive and Oriented Speech/Thought Process Coherent and Relevant Affect/Mood Anxious, Depressed and Full range Insight Fair Judgment Moderate and Fair Response to Interventions Attentive, Engaged, Interested and Receptive Individualization SUMMARY NOTE Group 1:During IOP Group Therapy via ZOOM Patient self-reported on a scale of 0 = None to 10= Highest; patient described their most concerning symptomatology as; depression 4, irritability 4, rumination 5, racing thoughts 0, anxiety 6, sleep problems 3, drug cravings 6 and 0 for all other assessed symptomatology.  Patient report was congruent with the patient's observed presentation.  Patient reports goal for the week as:Focus on being more organized. I also want plan vacation with my son.  Patient identifies things that went well/grateful for;  family, car, my son.  Patient identified purposely working on the following skill; the beautiful  weather,  having  a birthday party for my son this weekend Patient stated she is axonis about the party seeing a lot of people. Group facilitator suggested that she can excuse herself and take a break when needed.  Patient reports to be taking their medication as prescribed and reports no concerns noted at this time.  Patient reports currently to not be experiencing any ideation, intent, or plans in reference to suicidal or homicidal symptomatology Plan Continue to assist patient with skills to improve functionContinue to assist patient with skills to manage symptomsContinue to reinforce use of positive coping skillsContinue to review and assess symptoms / issuesContinue to review and assess treatment goals / skill useContinue per Plan of Care / Interdisciplinary Plan of Care [place] - Suicide Severity Screen    Most Recent Value Have you wished you were dead or wished you could go to sleep and not wake up?  No Have you actually had any thoughts of killing yourself?   No Have you ever done anything, started to do anything, or prepared to do anything to end your life?   No [place] Suicide Risk Level  Low Risk  The Self-Report [place] Risk Suicide Severity Rating Scale Since Last Visit was completed by Voncille Lo and reviewed by myself today.  Additionally, risk assessment from [facility] intake assessment and follow up screenings have been reviewed.  Based on Alizae Willmore?s clinical presentation today as well as her self-reported symptom ratings today and over the current course of treatment, I have assessed Bentley Haralson to be at unchanged risk of harm to self as compared with prior recent assessments.  Based on this assessment, I have continued current treatment plan including implementation of factors to mitigate risks of harm to self as outlined in the last risk assessmentKaren Abb Gobert, LCSW5/19/20214:04 PM

## 2019-07-28 NOTE — Group Note
Group Session 2:  Group TherapyGroup Start Time:   11:10 AM      End Time:  12:00 PMFacilitators:  Jourdain Guay, LCSWVIDEO TELEHEALTH VISIT, PLATFORM OTHER THAN MYCHART: This clinician is conducting this visit at a site within our enrolled clinical location..  For this visit the clinician and patient were present via interactive audio & video telecommunications system that permits real-time communications, via a platform other than MyChart.Platforms other than MyChart may be used only during the COVID-19 crisis, when MyChart is not an option, and video is required for clinical reasons. Reason for choice of video platform other than MyChart: Currently [facility] has identified Zoom as the appropriate telehealth platform to facilitate IOP Group Therapy.PLEASE NOTE: CONSENT FOR THIS VISIT IS DIFFERENT THAN FOR MYCHART VIDEO VISITS! Verbal consent must be obtained by the clinician or staff prior to starting visit.  Read the Following Statements to Video Visit Participants:  ?(Name of clinician) will be conducting your video visit today. Before we begin, we must obtain your consent for today's visit.Can you confirm your name, date of birth, and state where you (or your child, if applicable) are currently located?By consenting, you understand that today's visit will be conducted through video conferencing technology other than our usual secure service. While we do everything possible to ensure your privacy, we may not be able to guarantee the security of this particular platform. You understand that the visit will not be recorded, and that we will be documenting this visit in our electronic medical record, and that we may disclose records concerning this interaction to other clinicians. You understand that a video visit may limit our ability to diagnose your condition.  You understand that if you are experiencing a medical emergency, you will be directed to dial 9-1-1 as we are not able to connect you directly. You also understand if your condition changes after this visit and you need of further care, you will contact our office. Do you agree to this consent, knowing that you can change your decision at any time? Do you have any other questions before we proceed??Patient consent given for video visit: yesState patient is located in: [place]: at patient's home addressThe clinician is appropriately licensed in the above state to provide care for this visit.Other individuals present during the telehealth encounter and their role/relation: Identified group facilitator(s), support staff as needed, and fellow group participants.Session Focus Connection between feelings, thoughts and behaviorsDevelop skills to manage issues and improve functioningTreatment goal and skill use Number of Participants 6 Treatment Modality Group Therapy and Psycho-education Interventions/Education Assisted patient addressing stressful life situationsAssisted patient with discharge planningHelped decrease isolation/increase sense of belongingHelped identify feelings, thoughts and behaviors connectionHelped increase awareness of feelings, thoughts and behaviorsHelped patient acquire skills to build self-confidenceHelped patient identify strengthsHelped patient increase ability to gain support from othersHelped patient increase comfort with othersHelped patient increase interpersonal effectivenessHelped patient increase sense of acceptance by othersProvided assistance problem-solving challengesProvided positive reinforcement for successesProvided reinforcement for patient contributions to othersProvided reinforcement for use of adaptive coping skills Attendance            AttendedEngagement InterventionsNot applicable - patient attended group without difficulty Degree of Participation Moderate and off screen at times due to needing to supervise her toddler son. Behavior Alert, Calm and Interactive Speech/Thought Process Organized and Relevant Affect/Mood Euthymic and Stable Insight Fair Judgment Moderate Response to Interventions Attentive and Interested Individualization Today?s third group was conducted virtually through a ZOOM.  Today?s group evolved into a follow up from Monday?s  groups in which forgiveness was discussed at length and also the multiple elements of life that are interconnected was presented.Patient ws distracted by her son at times but still abel to join in group discussion.  Patient shared how she is noticing that she is not reacting as aggressively as she normally would.  She said she's been trying to think and take a moment before responding.  Patient said, I have anger issues, and spoke of times when she'd become physically aggressive.  Patient also shared how she has very limited assistance in caring for her son as his father and entire side of his family is not interested in being involved in his life.  Patient received support and encouragement from peers. She expressed gratitude for the group and thanked group for helping her.Patient did not express any suicidal or homicidal thoughts, plan or intent during this group. Plan Continue to assist patient with skills to improve functionContinue to assist patient with skills to manage symptomsContinue to reinforce use of positive coping skillsContinue to review and assess symptoms / issuesContinue to review and assess treatment goals / skill useContinue per Plan of Care / Interdisciplinary Plan of Care Massa Pe, LCSW5/19/20212:49 PM

## 2019-07-29 ENCOUNTER — Inpatient Hospital Stay: Admit: 2019-07-29 | Discharge: 2019-07-29 | Payer: MEDICAID | Primary: Cardiovascular Disease

## 2019-07-29 DIAGNOSIS — F3189 Other bipolar disorder: Secondary | ICD-10-CM

## 2019-07-29 DIAGNOSIS — R5383 Other fatigue: Secondary | ICD-10-CM

## 2019-07-29 DIAGNOSIS — F411 Generalized anxiety disorder: Secondary | ICD-10-CM

## 2019-07-29 MED ORDER — LITHIUM CARBONATE ER 300 MG TABLET,EXTENDED RELEASE
300 mg | ORAL_TABLET | 1 refills | Status: AC
Start: 2019-07-29 — End: 2019-07-30

## 2019-07-29 NOTE — Group Note
Group Session:  Group TherapyGroup Start Time:   11:10 AM      End Time:  12:00 PMFacilitators:  Cristino, Dorene Grebe, LCSW VIDEO TELEHEALTH VISIT, PLATFORM OTHER THAN MYCHART: This clinician is conducting this visit at a site within our enrolled clinical location..  For this visit the clinician and patient were present via interactive audio & video telecommunications system that permits real-time communications, via a platform other than MyChart.Platforms other than MyChart may be used only during the COVID-19 crisis, when MyChart is not an option, and video is required for clinical reasons. Reason for choice of video platform other than MyChart: Currently [facility] has identified Zoom as the appropriate telehealth platform to facilitate IOP Group Therapy.PLEASE NOTE: CONSENT FOR THIS VISIT IS DIFFERENT THAN FOR MYCHART VIDEO VISITS! Verbal consent must be obtained by the clinician or staff prior to starting visit.  Read the Following Statements to Video Visit Participants:  ?(Name of clinician) will be conducting your video visit today. Before we begin, we must obtain your consent for today's visit.Can you confirm your name, date of birth, and state where you (or your child, if applicable) are currently located?By consenting, you understand that today's visit will be conducted through video conferencing technology other than our usual secure service. While we do everything possible to ensure your privacy, we may not be able to guarantee the security of this particular platform. You understand that the visit will not be recorded, and that we will be documenting this visit in our electronic medical record, and that we may disclose records concerning this interaction to other clinicians. You understand that a video visit may limit our ability to diagnose your condition.  You understand that if you are experiencing a medical emergency, you will be directed to dial 9-1-1 as we are not able to connect you directly. You also understand if your condition changes after this visit and you need of further care, you will contact our office. Do you agree to this consent, knowing that you can change your decision at any time? Do you have any other questions before we proceed??Patient consent given for video visit: yes State patient is located in: [place] [facility] clinician is appropriately licensed in the above state to provide care for this visit.Other individuals present during the telehealth encounter and their role/relation: Identified group facilitator(s), support staff as needed, and fellow group participants. Session Focus Connection between feelings, thoughts and behaviorsDevelop skills to manage issues and improve functioningStatus of symptoms / issuesTreatment goal and skill useDaily goal setting Number of Participants 7 Treatment Modality Group Therapy Interventions/Education Assisted patient addressing stressful life situationsAssisted patient with discharge planningAssisted patient with strategies for addressing peer issuesAssisted patient with strategies for managing illness/symptomsAssisted patient with terminationEncouraged increased cooperation with othersEncouraged increased initiation in groupsEncouraged patient to increase treatment focusEncouraged patient to verbalize, rather than enactEncourages patient to provide constructive feedback to othersAssisted patient with strategies for addressing family issuesAssisted patient with strategies for addressing school issuesEncouraged acceptance, tolerance and respect for othersEncouraged appropriate expression of thoughts and feelingsEncouraged improved listening skillsEncouraged patient to request as-needed help solving problemsHelped decrease isolation/increase sense of belongingHelped identify feelings, thoughts and behaviors connectionHelped increase awareness of feelings, thoughts and behaviorsHelped patient acquire skills to build self-confidenceHelped patient identify strengthsHelped patient increase ability to gain support from othersHelped patient increase comfort with othersHelped patient increase interpersonal effectivenessHelped patient increase sense of acceptance by othersProvided assistance problem-solving challengesProvided positive reinforcement for successesProvided reinforcement for patient contributions to othersProvided reinforcement for use of adaptive coping skills  Attendance            AttendedEngagement InterventionsNot applicable - patient attended group without difficulty Degree of Participation Active Behavior Alert, Cooperative and Interactive Speech/Thought Process Coherent, Directed and Relevant Affect/Mood Euthymic and Full range Insight Moderate Judgment Moderate Response to Interventions Attentive, Engaged, Interested and Receptive Individualization Group# 2Group members were given the opportunity to discuss any concerns or to review anything previously discussed in group. One group member brought up the practice of mindfulness as he had attempted to practice the skill yesterday. There was a brief review and discussion of mindfulness. Group then discussed the week in review / thinking about the week that is ending. Group members were given specific questions to answer and discuss. For each question group members went around providing responses.  The following questions were posed to all group members: What was something you learned this week? And/or Is there anything this week that you wish you had done differently? If yes, what did you take from that experience? Patient stated there was nothing she would have done differently this week. Barriers were discussed yesterday. Patient reported learning from the group. Patient reported she was able to take some action and got her car detailed. Patient asked the group facilitator if she can use alcohol  at a bachelorette party in July.  Recommendation was made to patient to remain abstinent from substances including alcohol.  Another group member explained to patient her regret for times that group member was not sober. Patient was receptive to feedback from others.  Plan Continue to assist patient with skills to improve functionContinue to assist patient with skills to manage symptomsContinue to reinforce use of positive coping skillsContinue to review and assess symptoms / issuesContinue to review and assess treatment goals / skill useContinue per Plan of Care / Interdisciplinary Plan of Care Damarys Speir Cristino, LCSW5/20/20215:02 PM

## 2019-07-29 NOTE — Progress Notes
VIDEO TELEHEALTH VISIT, PLATFORM OTHER THAN MYCHART: This clinician is conducting this visit at a site within our enrolled clinical location..  For this visit the clinician and patient were present via interactive audio & video telecommunications system that permits real-time communications, via a platform other than MyChart.Platforms other than MyChart may be used only during the COVID-19 crisis, when MyChart is not an option, and video is required for clinical reasons. Reason for choice of video platform other than MyChart: Patient unable to access MyChart Video secondary to currently being in [employer] via [employer].PLEASE NOTE: CONSENT FOR THIS VISIT IS DIFFERENT THAN FOR MYCHART VIDEO VISITS! Verbal consent must be obtained by the clinician or staff prior to starting visit.  Read the Following Statements to Video Visit Participants:  ?(Name of clinician) will be conducting your video visit today. Before we begin, we must obtain your consent for today's visit.Can you confirm your name, date of birth, and state where you (or your child, if applicable) are currently located?By consenting, you understand that today's visit will be conducted through video conferencing technology other than our usual secure service. While we do everything possible to ensure your privacy, we may not be able to guarantee the security of this particular platform. You understand that the visit will not be recorded, and that we will be documenting this visit in our electronic medical record, and that we may disclose records concerning this interaction to other clinicians. You understand that a video visit may limit our ability to diagnose your condition.  You understand that if you are experiencing a medical emergency, you will be directed to dial 9-1-1 as we are not able to connect you directly. You also understand if your condition changes after this visit and you need of further care, you will contact our office. Do you agree to this consent, knowing that you can change your decision at any time? Do you have any other questions before we proceed??Patient consent given for video visit: yesState patient is located in: [place] @ home address The clinician is appropriately licensed in the above state to provide care for this visit.Other individuals present during the telehealth encounter and their role/relation: noneTotal time spent in medical video consultation (required if billing based on time): 14 minutes  Start Time: 10:36  End Time: 10:50[facility] Adult Reach [phone] [address] AvenueBridgeport  [id number] Number: [phone] Number: [phone] Outpatient Psychiatry MD/LIP Progress Note5/20/2021Start Time:  10:36      End Time:  10:50Session Type:  Medication ManagementAmanda Scott is a 24 y.o., Single, female.SUBJECTIVE Chief Complaint: I couldn't sleep for two nights.  Interim History:  24 year old female with past psychiatric history of bipolar disrder (manic and depressive) and a history of suicide attempts and anxiety disorder referred to REACH adult IOP by [facility] following an inpatient admission for worsening depression and anxiety symptoms at the beginning of March 2021. However, she finished a 1/2 of a group and then left the program. On Thursday, 06/17/2019, Zoe Scott called REACH requesting a refill on her medication.  She reported feeling sick and very nauseated and lethargic. She was given a refill and started IOP on, 06/21/19. Zoe Scott was last seen and continued on vraylar 3mg  daily & zolpidem 5mg  nightly; increased lithium 300mg  in AM and 600mg  in PM, decreased seroquel 100mg  every night and night, and . Zoe Scott reports having difficulty sleeping for three days this week. She repots, I felt off from the 17th-18th. I couldn't sit  down. I was more restless, buying more items, and got my nose pierced. She reports that her moods are up and down. She reports no suicidal thoughts since the last visit. Zoe Scott reports finishing school for the semester last week. Zoe Scott reports applying for a jobs but has difficulty because no child care for her son. She reports sleeping better last night and more stability in her moods. She reprots no impulses to purchase items or increase sexual activity. She reports worrying but it's been less. currently, no imminent safety concerns. Plan: Continue IOPDraw lithium level Continue  lithium 300mg  in the morning and 600mg  Continue seroquel 100mg  at night Continue vraylar 3mg  Continue zolpidem 5mg  nightly Continue hydroxyzine 25mg  every four hours as needed for anxiety ?REVIEW OF ALLERGIES/MEDICATIONS/HISTORY I have reviewed the patient's current medications and allergiesI have reviewed with the patient, the risks and benefits of the medications prescribed.OBJECTIVE Review of SystemsReview of Systems Psychiatric/Behavioral: Positive for dysphoric mood. The patient is nervous/anxious.  All other systems reviewed and are negative. LabsNo new labsCurrent MedicationsCurrent Outpatient Medications Medication Sig ? cariprazine (VRAYLAR) 3 mg Cap Take 1 capsule (3 mg total) by mouth daily. ? lithium carbonate (LITHOBID CR) 300 mg CR extended release tablet Take 1 tablet orally every morning and 2 tablets orally every night ? QUEtiapine (SEROQUEL) 100 mg Immediate Release tablet Take 100 mg by mouth nightly. ? zolpidem (AMBIEN) 5 mg tablet Take 1 tablet (5 mg total) by mouth nightly as needed for sleep. No current facility-administered medications for this encounter.  Mental Status ExamGeneral AppearanceHabitus:  MediumGrooming:  GoodMusculoskeletalStrength and Tone: Strength normalGait and Station: Stable gait and stable posturePsychiatricAttitude: Cooperative, good eye contact and pleasantPsychomotor Behavior: No psychomotor activation or retardationSpeech: normal rate, volume and prosodyMood: Normal anxious, not discouraged   Patient reported mood:  I feel goodAffect: Congruent to reported moodThought Process: Coherent, logical, goal directedAssociations: NormalThought Content: Normal no auditory hallucinations, no command auditory hallucinations, no paranoid delusions, not perseverativeSuicidal Ideation: No current suicidal plan, ideation or intentHomicidal Ideation: No current homicidal ideation, plan or intentJudgment:  GoodInsight:  GoodCognitive EvaluationOrientation: Oriented to person, oriented to place and oriented to date/time Attention and Concentration:  Normal attention and concentrationMemory: Recent and remote memory intactLanguage:  Language intactFund of Knowledge: average.Abstract Reasoning: Normal capacity for abstract reasoningSafety and Risk AssessmentPSY RISK ASSESSMENT SAFE-T WITH C-SSRS C-SSRS: Suicidal Ideation:  Since Last Assessment  WISH TO BE DEAD:  No  CURRENT SUICIDAL THOUGHTS:  No  SUICIDAL THOUGHTS WITH METHOD:  No  SUICIDAL INTENT WITHOUT SPECIFIC PLAN:  No  INTENT WITH PLAN:  No  Have you ever done anything, started to do anything or prepared to do anything to end your life?:  Yes  Was it within the past 3 months?:  NoSuicidal Ideation Intensity :   FREQUENCY:  2-5 times a week  DURATION:  Fleeting - few seconds or minutes  CONTROLLABILITY:  Easily able to control thoughts  DETERRENTS:  Deterrents definitely stopped you from attempting suicide  REASONS FOR IDEATION:  Does not applyRisk Assessment:   Access to Lethal Methods:  NoCurrent and Past Psychiatric Diagnoses:    Mood Disorder:  Recurrent/Current  Psychotic Disorder:  No  Alcohol/Substance Abuse Disorders:  Recurrent/Current  PTSD: No  ADHD:  No  TBI:  No  Cluster B Personality Disorders or Traits:  No  Conduct Problems:  No  Suicide Attempt:  No Prior Attempts  Presenting Symptoms:  Anhedonia:  Yes  Impulsivity:  No  Hopelessness or Despair:  No  Anxiety and/or Panic:  No  Insomnia:  No  Command Hallucinations:  No  Psychosis:  No  Self-injurious Behavior:  No  Physical Aggression Toward Others:  No  Violence, Victimization and/or Perpetration:  No  Family History:   Suicide:  No  Suicidal Behavior:  No  Axis I Psychiatric Diagnosis requiring hospitalization:  No  Precipitants / Stressors:   Triggering events leading to humiliation, shame and/or despair:  No  Chronic physical pain or other acute medical problem:  No  Sexual / Physical Abuse:  No  Substance Intoxication or Withdrawal:  No  Pending Incarceration:  No  Legal Problems:  No  Inadequate Social Supports:  No  Social Isolation:  No  Perceived burden on others:  No  Bullying / Cyberbullying:  No  Media portrayal of suicide:  No  Housing Insecurity:  No  Financial Insecurity:  No  Stressful Life Events:  Yes  Gender / Sexual Identity:  No  Homelessness:  No  Change in Treatment:    Recent inpatient discharge:  No  Change in provider or treatment:  No  Hopeless or dissatisfied with provider or treatment:  No  Non-compliant:  Yes  Not receiving treatment:  NoCurrent and Past Psychiatric Diagnoses:  Mood Disorder:  Recurrent/Current  Psychotic Disorder:  No  Alcohol/Substance Abuse Disorders:  Recurrent/Current  PTSD:  No  ADHD:  No  TBI:  No  Cluster B Personality Disorders or Traits:  No  Conduct Problems:  No  Suicide Attempt:  No Prior AttemptsProtective Factors:   Internal:   Ability to cope with stress:  Yes  Frustration tolerance:  Yes  Fear of death or the actual act of killing self:  Yes  Identifies reasons for living:  Yes  Problem solving skills:  Yes External:   Responsibility to children:  Yes  Beloved pets:  Yes  Supportive social network of friends or family:  Yes  Positive therapeutic relationships / Effective mental healthcare:  YesRisk to Self - Self-Injurious Behavior:   Current Urges to harm Self:  Yes  Recent Self-Injury:  No  History of Self Injury:  No  Imminent Risk for Self Injury in Community:  Low  Imminent Risk for Self Injury in Facility:  LowRisk to Others:   Current Agitation:  No  Homicidal/Aggressive Ideation:  No  Homicidal/Aggressive Threat/Plan:  No  Recent Violence/Aggression:  NoWithdrawal Risk:   Alcohol / Benzodiazepines / Barbiturates:  Not applicable     Alcohol/Benzodiazepine/Barbiturate Risk for Withdrawal:  Absent  Opioids:  Not applicable     Opioid Risk for Withdrawal:  AbsentMEDICAL DECISION MAKING DiagnosisProblem List         ICD-10-CM   OP/IOP/PHP Psychiatry  MDD (major depressive disorder), recurrent severe, 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 Zoe Scott reports taking all her medications as prescribed and feeling more motivated and having no suicidal ideation.  Will continue with IOP at this time. Currently, no imminent safety concerns. The Self-Report [place] Risk Suicide Severity Rating Scale Since Last Visit was completed by Zoe Scott and reviewed by myself today.  Additionally, risk assessment from [facility]  [facility]'s intake assessment and follow up screenings have been reviewed.  Based on Zoe Scott?s clinical presentation today as well as her self-reported symptom ratings today and over the current course of treatment, I have assessed Zoe Scott to be at unchanged risk of harm to self as compared with prior recent assessments.  Based on this assessment, I have continued current treatment plan including implementation of factors to mitigate risks of harm to self as outlined in the last risk assessmentIntervention(s)Supportive therapy Labs/Test/Consults OrderedRepeat lithium Medication ChangesNone today   PlanContinue IOPIncrease lithium 300mg  in the morning and 600mg  Decrease seroquel 100mg  at night Continue vraylar 3mg  (change to nightly) Continue zolpidem 5mg  nightly Continue hydroxyzine 25mg  every four hours as needed for anxiety Discussed side effects, pros & cons of treatment, and alternative treatment options. Reviewed safety plan to call the clinic, the National Suicide Prevention Lifeline: 858-762-3273, or  911 to go to the ER if safety is at risk. Not deemed at imminent risk of harm to self/others at this timePatient verbalized understanding and had no additional questions. Electronically Signed:Joushua Dugar, NP 07/29/2019 10:54 AM

## 2019-07-29 NOTE — Care Coordination-Inpatient
Wednesday Jul 28, 2019:On Wednesday Jul 28, 2019 patient's [facility] worker Rae Roam left a voicemail message for patient's primary clinician Onalee Hua brown, LCSW.  Due to Onalee Hua brown being out of the office this week and returning until Monday Aug 02, 2019 this Baldwin Jamaica then contacted this clinical coordinator and left voicemail message requesting call back stating, ?DCF is involved with Xaniyah and were trying to close the case but we need an update and would like to talk to you before closing the case.?  Who missed Baldwin Jamaica left the following number for her to be called back on, [phone].On May 19th 2021 this writer contacted Mr. Baldwin Jamaica at 11:30 a.m.Marland Kitchen  Miss Baldwin Jamaica requested a progress update however did not have a signed consent for release of information.  The REACH program also does not have a signed consent from patient to coordinate care or release information to [facility] therefore this writer requested either patient go to the [facility] office or the REACH program office to sign a consent for release of information, or requested that patient be a part this phone call.  This Clinical research associate stated that fluid a 3 way phone conference call with miss Baldwin Jamaica and patient today May 19th any time between now and 1:00 p.m.Marland Kitchen or after for p.m..  Miss Theadora Rama and stated that she would attempt to get patient on phone and call this Clinical research associate back.(This Clinical research associate was able to consult with the treatment team for progress updates regarding patient, and the following information was gathered:- At the start of treatment, patient was guarded, passive, withdrawn, with irritable and labile mood. Patient is now demonstrating improved engagement and actively participating in the therapeutic groups. Patient appears interested and motivated. Patient is observed frequently processing her thoughts and feelings and addressing her concerns in the group environment, being receptive to staff and peer feedback.- Concerns include patient's difficulty with tolerating and managing the juggle between caring for her 35 year old son on her own, and fully engaging in her mental health treatment as she is frequently having to care for and tend for child during IOP treatment, resulting in patient having to either not attend treatment, leave early or turn her camera on/off frequently (treatment is via zoom virtual telehealth forum).- Recommendation from REACH treatment team for DCF to arrange and coordinate child care for patient and her son before they close the case. )Thursday Jul 29, 2019:As of the morning of Thursday Jul 29, 2019 this writer had not heard back from miss [place] from [facility] therefore this Clinical research associate called miss Baldwin Jamaica back at approximately10:15 AM [phone] and left voicemail message again requesting a 3 way conference call with herself and patient.  This Clinical research associate left message that Clinical research associate is available today May 20th at 1:00 p.m. to 2:00 p.m. or after 3:00 p.m.At approximately 12 PM This Clinical research associate received a return voicemail message from Mr. Baldwin Jamaica stating that she contacted patient this morning and patient stated she would be available for a 1:00 p.m. phone call.  However this Clinical research associate received another voicemail message from Pincus Large later this afternoon stating that when miss Baldwin Jamaica contacted patient at 1:00 p.m. she did not answer and had to leave a voicemail message.  Mr. Baldwin Jamaica stated that she had not heard back from patient therefore requested this conference phone call take place on Monday Aug 02, 2019 when patient's clinician [place] Shi LCSW returns to the office.This Clinical research associate will follow-up with patient's primary clinician Eureka Shi, LCSW when he returns on 08/02/19.Deontra Pereyra, LCSW5/20/20215:34 PM

## 2019-07-29 NOTE — Group Note
Group Session 1:  Group TherapyVIDEO TELEHEALTH VISIT, PLATFORM OTHER THAN MYCHART: This clinician is conducting this visit at a site within our enrolled clinical location..  For this visit the clinician and patient were present via interactive audio & video telecommunications system that permits real-time communications, via a platform other than MyChart.Platforms other than MyChart may be used only during the COVID-19 crisis, when MyChart is not an option, and video is required for clinical reasons. Reason for choice of video platform other than MyChart: Currently [facility] has identified Zoom as the appropriate telehealth platform to facilitate IOP Group Therapy.PLEASE NOTE: CONSENT FOR THIS VISIT IS DIFFERENT THAN FOR MYCHART VIDEO VISITS! Verbal consent must be obtained by the clinician or staff prior to starting visit.  Read the Following Statements to Video Visit Participants:  ?(Name of clinician) will be conducting your video visit today. Before we begin, we must obtain your consent for today's visit.Can you confirm your name, date of birth, and state where you (or your child, if applicable) are currently located?By consenting, you understand that today's visit will be conducted through video conferencing technology other than our usual secure service. While we do everything possible to ensure your privacy, we may not be able to guarantee the security of this particular platform. You understand that the visit will not be recorded, and that we will be documenting this visit in our electronic medical record, and that we may disclose records concerning this interaction to other clinicians. You understand that a video visit may limit our ability to diagnose your condition.  You understand that if you are experiencing a medical emergency, you will be directed to dial 9-1-1 as we are not able to connect you directly. You also understand if your condition changes after this visit and you need of further care, you will contact our office. Do you agree to this consent, knowing that you can change your decision at any time? Do you have any other questions before we proceed??Patient consent given for video visit: yesState patient is located in: [place]The clinician is appropriately licensed in the above state to provide care for this visit.Other individuals present during the telehealth encounter and their role/relation: Identified group facilitator(s), support staff as needed, and fellow group participants.Group Start Time:   10:00 AM      End Time:  11:00 AMFacilitators:  Dorothea Ogle, LCSW; Naughton, Victorino Dike, LCSWSession Focus Connection between feelings, thoughts and behaviorsDevelop skills to manage issues and improve functioningStatus of symptoms / issuesTreatment goal and skill useDaily goal setting Number of Participants 7 Treatment Modality Group Therapy Interventions/Education Assisted patient addressing stressful life situationsAssisted patient with discharge planningAssisted patient with strategies for addressing peer issuesAssisted patient with strategies for managing illness/symptomsAssisted patient with terminationEncouraged increased cooperation with othersEncouraged increased initiation in groupsEncouraged patient to increase treatment focusEncouraged patient to verbalize, rather than enactEncourages patient to provide constructive feedback to othersAssisted patient with strategies for addressing family issuesEncouraged acceptance, tolerance and respect for othersEncouraged appropriate expression of thoughts and feelingsEncouraged improved listening skillsEncouraged patient to request as-needed help solving problemsHelped decrease isolation/increase sense of belongingHelped identify feelings, thoughts and behaviors connectionHelped increase awareness of feelings, thoughts and behaviorsHelped patient acquire skills to build self-confidenceHelped patient identify strengthsHelped patient increase ability to gain support from othersHelped patient increase comfort with othersHelped patient increase interpersonal effectivenessHelped patient increase sense of acceptance by othersProvided assistance problem-solving challengesProvided positive reinforcement for successesProvided reinforcement for patient contributions to othersProvided reinforcement for use of adaptive coping skills Attendance  Partial attendance due to MD/APRN Meeting.  Total number of minutes attended:  36Engagement InterventionsNot applicable - patient attended group without difficulty Degree of Participation Moderate Behavior Alert and Appropriate Speech/Thought Process Coherent and Focused Affect/Mood Euthymic Insight Moderate Judgment Moderate Response to Interventions Attentive and Receptive Individualization Patient participated in this group therapy session via zoom telehealth video conferencing. Patient/Guardian confirmed being at their home location and provided verbal consent for engagement in treatment via telehealth video conferencing for this group session. During this session, group processed check-in sheet and C-SSRS. Patient self-identified current symptomatology on a scale from 0 - None to 10 - highest. Patient reported depression as 5, irritability as 4, rumination as 3, racing thoughts as 2, anxiety as 4, drug craving as 4, and all other assessed symptomatology as 0.  Patient processed trying to manage anxiety when she is in public spaces and reports that exercise has been helpful with this. Patient reports she has been going to the gym regularly. Patient described feeling grateful for her mom babysitting her son tonight to give her some free time. Patient identified plan to catch up on laundry and things around the home. Patient also reported feeling grateful for her health and the nice weather. Patient shared weekend plans that she is looking forward to, including her son's birthday party. Patient reports that she is not currently experiencing any suicidal or homicidal ideation, plan, or intent. [place] - Suicide Severity Screen    Most Recent Value Have you wished you were dead or wished you could go to sleep and not wake up?  No Have you actually had any thoughts of killing yourself?   No Have you ever done anything, started to do anything, or prepared to do anything to end your life?   No [place] Suicide Risk Level  Low Risk   Plan Continue to assist patient with skills to improve functionContinue to assist patient with skills to manage symptomsContinue to reinforce use of positive coping skillsContinue to review and assess symptoms / issuesContinue to review and assess treatment goals / skill useContinue per Plan of Care / Interdisciplinary Plan of Care Dorothea Ogle, LCSW5/20/202111:47 AM

## 2019-07-30 ENCOUNTER — Encounter: Admit: 2019-07-30 | Payer: PRIVATE HEALTH INSURANCE | Attending: Psychiatry | Primary: Cardiovascular Disease

## 2019-07-30 ENCOUNTER — Encounter
Admit: 2019-07-30 | Payer: PRIVATE HEALTH INSURANCE | Attending: Psychiatric/Mental Health | Primary: Cardiovascular Disease

## 2019-07-30 ENCOUNTER — Inpatient Hospital Stay: Admit: 2019-07-30 | Discharge: 2019-07-30 | Payer: MEDICAID | Primary: Cardiovascular Disease

## 2019-07-30 ENCOUNTER — Encounter: Admit: 2019-07-30 | Payer: PRIVATE HEALTH INSURANCE | Primary: Cardiovascular Disease

## 2019-07-30 DIAGNOSIS — F3189 Other bipolar disorder: Secondary | ICD-10-CM

## 2019-07-30 DIAGNOSIS — R5383 Other fatigue: Secondary | ICD-10-CM

## 2019-07-30 DIAGNOSIS — F1210 Cannabis abuse, uncomplicated: Secondary | ICD-10-CM

## 2019-07-30 DIAGNOSIS — F411 Generalized anxiety disorder: Secondary | ICD-10-CM

## 2019-07-30 LAB — LITHIUM LEVEL: BKR LITHIUM LEVEL: 2 mmol/L — CR (ref 0.60–1.20)

## 2019-07-30 MED ORDER — LITHIUM CARBONATE ER 300 MG TABLET,EXTENDED RELEASE
300 mg | ORAL_TABLET | Freq: Every morning | ORAL | 1 refills | Status: AC
Start: 2019-07-30 — End: 2019-08-11

## 2019-07-30 MED ORDER — CARIPRAZINE 4.5 MG CAPSULE
4.5 mg | ORAL_CAPSULE | Freq: Every day | ORAL | 1 refills | Status: AC
Start: 2019-07-30 — End: 2019-09-14

## 2019-07-30 NOTE — Group Note
Group Session:  Group TherapyGroup Start Time:   12:10 PM      End Time:   1:00 PMFacilitators:  [name], LCSWVIDEO TELEHEALTH VISIT, PLATFORM OTHER THAN MYCHART: This clinician is conducting this visit at a site within our enrolled clinical location..  For this visit the clinician and patient were present via interactive audio & video telecommunications system that permits real-time communications, via a platform other than MyChart.Platforms other than MyChart may be used only during the COVID-19 crisis, when MyChart is not an option, and video is required for clinical reasons. Reason for choice of video platform other than MyChart: Currently [facility] has identified Zoom as the appropriate telehealth platform to facilitate IOP Group Therapy.PLEASE NOTE: CONSENT FOR THIS VISIT IS DIFFERENT THAN FOR MYCHART VIDEO VISITS! Verbal consent must be obtained by the clinician or staff prior to starting visit.  Read the Following Statements to Video Visit Participants:  ?(Name of clinician) will be conducting your video visit today. Before we begin, we must obtain your consent for today's visit.Can you confirm your name, date of birth, and state where you (or your child, if applicable) are currently located?By consenting, you understand that today's visit will be conducted through video conferencing technology other than our usual secure service. While we do everything possible to ensure your privacy, we may not be able to guarantee the security of this particular platform. You understand that the visit will not be recorded, and that we will be documenting this visit in our electronic medical record, and that we may disclose records concerning this interaction to other clinicians. You understand that a video visit may limit our ability to diagnose your condition.  You understand that if you are experiencing a medical emergency, you will be directed to dial 9-1-1 as we are not able to connect you directly. You also understand if your condition changes after this visit and you need of further care, you will contact our office. Do you agree to this consent, knowing that you can change your decision at any time? Do you have any other questions before we proceed??Patient consent given for video visit: yesState patient is located in: [facility] clinician is appropriately licensed in the above state to provide care for this visit.Other individuals present during the telehealth encounter and their role/relation: Identified group facilitator(s), support staff as needed, and fellow group participants.Session Focus Connection between feelings, thoughts and behaviorsDevelop skills to manage issues and improve functioningStatus of symptoms / issuesTreatment goal and skill useDaily goal setting Number of Participants 7 Treatment Modality Group Therapy Interventions/Education Assisted patient addressing stressful life situationsAssisted patient with strategies for addressing peer issuesAssisted patient with strategies for managing illness/symptomsEncouraged increased cooperation with othersEncouraged increased initiation in groupsEncouraged patient to increase treatment focusEncouraged patient to verbalize, rather than enactEncourages patient to provide constructive feedback to othersAssisted patient with strategies for addressing family issuesAssisted patient with strategies for addressing school issuesEncouraged acceptance, tolerance and respect for othersEncouraged appropriate expression of thoughts and feelingsEncouraged improved listening skillsEncouraged patient to request as-needed help solving problemsHelped decrease isolation/increase sense of belongingHelped identify feelings, thoughts and behaviors connectionHelped increase awareness of feelings, thoughts and behaviorsHelped patient acquire skills to build self-confidenceHelped patient identify strengthsHelped patient increase ability to gain support from othersHelped patient increase comfort with othersHelped patient increase interpersonal effectivenessHelped patient increase sense of acceptance by othersProvided assistance problem-solving challengesProvided positive reinforcement for successesProvided reinforcement for patient contributions to othersProvided reinforcement for use of adaptive coping skills Attendance  AttendedEngagement InterventionsNot applicable - patient attended group without difficulty Degree of Participation Active Behavior Alert, Calm, Cooperative, Interactive and Oriented Speech/Thought Process Coherent and Relevant Affect/Mood Euthymic and Full range Insight Moderate Judgment Moderate Response to Interventions Attentive, Engaged, Interested and Receptive Individualization SUMMARY NOTE Group 3:During IOP Group Therapy via ZOOM Patients discussed weekend planning.  Clinician advised patient's when, how to use a safety plan, and individualized their safety plans. Patient denied any anticipated conflicts or concerns for the weekend. Patient stated she is preparing for her son's birthday party this weekend.  Patient reports currently to not be experiencing any ideation, intent, or plans in reference to suicidal or homicidal symptomatology. Plan Continue to assist patient with skills to improve functionContinue to assist patient with skills to manage symptomsContinue to reinforce use of positive coping skillsContinue to review and assess symptoms / issuesContinue to review and assess treatment goals / skill useContinue per Plan of Care / Interdisciplinary Plan of Care [name], LCSW5/20/20215:20 PM

## 2019-07-30 NOTE — Progress Notes
Zoe Scott's lithium level for today, 07/30/19 is 2.0. This Clinical research associate and the nurse called Zoe Scott this morning. She reported feeling fatigued but denied any other symptoms. She reports no blurred vision, upset stomach, confusion, or diarrhea. Zoe Scott reports no use of advil, motrin, or any other NSAID in the last 30 days. Earlier this week Zoe Scott reported decreased need for sleep, increased impulsivity, more racing thoughts, and increased restlessness. She currently reports none of these symptoms. Zoe Scott to hold next two dosages of lithium. Discussed symptoms of lithium toxicity (blurred vision, diarrhea, upset stomach, shaking, confusion) and to go to the ER in case these occur. She verbalized understanding. Zoe Scott to drink water a cup of water every hour. She verbalized understanding. Plan to increase Vraylar to prevent any mania/depression now that lithium dosage is reduced.Talked with nurse who will call visiting nurse and review this plan. Course of lithium treatment since Zoe Scott started with [facility]. Her initial intake completed on 05/24/19. Prescribed lithium 450mg  twice a day and lithium level from 05/17/19: 0.92. Lithium level was ordered but Zoe Scott did not have this drawn. Zoe Scott was a no call/no show for several treatment days and discharged from the program 06/04/19. On 06/17/19 Zoe Scott called [facility] on 06/17/19 asking for a refill on her medication. She agreed to re-start the program and refills were provided with plan to follow-up the next week. This Clinical research associate met with Zoe Scott on 06/17/19, prescribed medication, and ordered another lithium level. On 06/21/19 this Clinical research associate met with Zoe Scott. Zoe Scott did not have her lithium level drawn. Encouraged Zoe Scott to have this complete. No changes to medications were done at this time. Lithium dosage continued at 450mg  twice a day. 06/28/19: This Clinical research associate met with Zoe Scott. Zoe Scott reported feeling more depressed with suicidal thoughts and decreased functioning. Lithium level on 06/28/19: 0.74. Increased lithium to 450mg  in AM and 750mg  nightly. Plan to meet with Zoe Scott the following week. 4/28/21[name] reported no change in depressed mood. Lithium continued at 450mg  in AM and 750mg  in PM. Lithium level was ordered. 5/3/21[name] did not have litihium level drawn. During this day's session she reported missing several dosages of lithium and only taking lithium 300mg  twice a day. She reported ongoing depressed mood. Reviewed plan and advised Zoe Scott to have lithium level drawn. She agreed to this plan. Started vraylar 1.5mg  daily to help stabilize mood. 07/13/19: A visiting nurse was ordered to visit Zoe Scott daily. 5/6/21[name] had lithium level drawn and level was 0.6. She continued on lithium 300mg  twice a day. Vraylar was increased to 3mg  daily. 5/13/21[name] reported feeling more depressed as well as increased impulsivity and suicidal thoughts. Lithium was increased to lithium 300mg  in Am and 600mg  at night.Plan to follow-up with Zoe Scott next week and have lithium level drawn. 07/26/19: patient did not attend IOP. No call/No show. She was scheduled to meet with this provider and have lithium level ordered. 07/29/19: Met with Zoe Scott who reported more restlessnes, impulsivity, spending, and up and down moods. Continued lithium at 300mg  and 600mg . Ordered lithium level and advised/encouraged Zoe Scott to have lithium level drawn. 07/30/19: Zoe Scott's lithium level 2.0 on lithium 300mg  in AM and 600mg  in PM. Advised Zoe Scott to hold next two dosages of lithium. Discussed symptoms of lithium toxicity (blurred vision, diarrhea, upset stomach, shaking, confusion) and to go to the ER in case these occur. She verbalized understanding. Zoe Scott to drink water a cup of water every hour. She verbalized understanding. Plan to increase Vraylar to prevent any mania/depression now that lithium  dosage is reduced. Electronically Signed:Brenisha Tsui, NP 07/30/2019 11:10 AM

## 2019-07-30 NOTE — Progress Notes
Writer was asked by Dr. Maryelizabeth Kaufmann to contact pt because her lithium level was critically high (2.0). Writer attempted to call pt at 940am. No answer writer left a VM to Regulatory affairs officer due to critically high Lithium level. Writer then called Lizette LPN who reported [facility]30am and took am lithium 300mg  at 930am today. Lizette lpn reported Pt  did not exhibit any s/sxs of toxicity pt looked fine this am. Writer spoke with pt's mother and explained situation . Mother reported she would call pt and have her call writer at [facility].Mother reported she spoke with pt this am and pt seemed fine. Writer received a phone call from pt at 1015am. Pt reported she felt lethargic the past 2 days and denied N/V , unsteady gait  and / or blurred vision. Writer informed pt to stop lithium until further notice and to drink water every hour to flush lithium out of her system. Pt acknowledged understanding.  Writer spoke with Shanda Bumps Dinisco APRN and informed her of above information at 1030am. Jessica Dinisco APRN  Changed medication orders and reported she will speak with pt today and give the following instructions.1) stop taking lithium until further notice,2) [facility] on Monday am 08/02/2019 and repeat Lithium level. Increase vraylar to 4.5mg  po daily starting tomorrow 07/31/2019. Writer called lizette LPN  At [id number] and informed LPN of above changes. Lizette LPN verbally acknowledged understanding. Vance Gather, RN5/21/202111:05 AM

## 2019-07-30 NOTE — Telephone Encounter
Called Tove and informed her to call this writer's office line: [phone] if she has concerns or if any symptoms of lithium toxicity occur. Discussed side effects, pros & cons of treatment, and alternative treatment options. Reviewed safety plan to call the clinic, the National Suicide Prevention Lifeline: (928) 083-3321, or  911 to go to the ER if safety is at risk. Not deemed at imminent risk of harm to self/others at this timePatient verbalized understanding and had no additional questions. Electronically Signed:Shrihan Putt, NP 07/30/2019 11:47 AM

## 2019-08-02 ENCOUNTER — Inpatient Hospital Stay: Admit: 2019-08-02 | Discharge: 2019-08-02 | Payer: MEDICAID | Primary: Cardiovascular Disease

## 2019-08-02 DIAGNOSIS — F1210 Cannabis abuse, uncomplicated: Secondary | ICD-10-CM

## 2019-08-02 DIAGNOSIS — R5383 Other fatigue: Secondary | ICD-10-CM

## 2019-08-02 DIAGNOSIS — F332 Major depressive disorder, recurrent severe without psychotic features: Secondary | ICD-10-CM

## 2019-08-02 LAB — LITHIUM LEVEL: BKR LITHIUM LEVEL: 0.24 mmol/L — ABNORMAL LOW (ref 0.60–1.20)

## 2019-08-02 MED ORDER — BENZTROPINE 1 MG TABLET
1 mg | ORAL_TABLET | Freq: Every evening | ORAL | 1 refills | Status: AC
Start: 2019-08-02 — End: 2019-08-05

## 2019-08-02 NOTE — Telephone Encounter
Writer called Michiel Cowboy LPN. Writer instructed Lizette LPN to  Restart lithium CR 300mg  tablets 2 tablets (total 600mg ) po daily in the am. D/C PM dose. Start cogentin 1mg  po at bedtime. Nurse to pre pour  Cogentin so pt may self administer. Lizette LPN acknowledged understanding. Vance Gather, RN5/24/20212:46 PM
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
## 2019-08-02 NOTE — Telephone Encounter
Clinician called patient's DCF worker F. Baldwin [place] [phone] and received voicemail left a message with his direct line requesting return call.  This Clinical research associate reported we received a release of information signed by the patient in the mail and thanked her for having patient complete this.  Clinician reported he was free unavailable to talk and provided his contact information for return call when she was free to communicate about patient's case.  Clinician to follow up ongoing as needed to make contact and coordinate care with DCF.Cindie Crumbly, LCSW5/24/20212:36 PM

## 2019-08-02 NOTE — Progress Notes
Called [place] [address] and she reported feeling  a lot better than the day before. Zoe Scott and she reports not receiving her scheduled lithium dosage yesterday or this morning. She reports, the nurse said it was to be determined. [name] reports sleeping well at night but yesterday she was restless. She reports no suicidal thoughts. She reports improved irritability and the groups advice is helping. [name] reports feeling more jittery and restless. she reports, it's annoying. I can't do anything without being shaky. Plan: Have nurse call visiting nurse and ask to administer oral lithium as scheduled: -- Lithium CR 600mg  in AM. -- Continue vraylar 4.5mg  daily -- Continue ambien 5mg  nightly -- Continue seroquel 100mg  nightly - Add cogentin 1mg  nightly (for EPS) -- Lithium level ordered. [name] will have this drawn today. Electronically Signed:Karen Huhta, NP 08/02/2019 2:30 PM

## 2019-08-02 NOTE — Group Note
Group Session 2:  Group TherapyGroup Start Time:   11:10 AM      End Time:  12:00 PMFacilitators:  Hyacinth Marcelli, LCSWVIDEO TELEHEALTH VISIT, PLATFORM OTHER THAN MYCHART: This clinician is conducting this visit at a site within our enrolled clinical location..  For this visit the clinician and patient were present via interactive audio & video telecommunications system that permits real-time communications, via a platform other than MyChart.Platforms other than MyChart may be used only during the COVID-19 crisis, when MyChart is not an option, and video is required for clinical reasons. Reason for choice of video platform other than MyChart: Currently [facility] has identified Zoom as the appropriate telehealth platform to facilitate IOP Group Therapy.PLEASE NOTE: CONSENT FOR THIS VISIT IS DIFFERENT THAN FOR MYCHART VIDEO VISITS! Verbal consent must be obtained by the clinician or staff prior to starting visit.  Read the Following Statements to Video Visit Participants:  ?(Name of clinician) will be conducting your video visit today. Before we begin, we must obtain your consent for today's visit.Can you confirm your name, date of birth, and state where you (or your child, if applicable) are currently located?By consenting, you understand that today's visit will be conducted through video conferencing technology other than our usual secure service. While we do everything possible to ensure your privacy, we may not be able to guarantee the security of this particular platform. You understand that the visit will not be recorded, and that we will be documenting this visit in our electronic medical record, and that we may disclose records concerning this interaction to other clinicians. You understand that a video visit may limit our ability to diagnose your condition.  You understand that if you are experiencing a medical emergency, you will be directed to dial 9-1-1 as we are not able to connect you directly. You also understand if your condition changes after this visit and you need of further care, you will contact our office. Do you agree to this consent, knowing that you can change your decision at any time? Do you have any other questions before we proceed??Patient consent given for video visit: yesState patient is located in: [place]: at patient's home addressThe clinician is appropriately licensed in the above state to provide care for this visit.Other individuals present during the telehealth encounter and their role/relation: Identified group facilitator(s), support staff as needed, and fellow group participants.Session Focus Connection between feelings, thoughts and behaviorsDevelop skills to manage issues and improve functioningStatus of symptoms / issues Number of Participants 6 Treatment Modality Group Therapy Interventions/Education Assisted patient addressing stressful life situationsAssisted patient with strategies for managing illness/symptomsEncouraged increased cooperation with othersEncouraged increased initiation in groupsEncourages patient to provide constructive feedback to othersEncouraged acceptance, tolerance and respect for othersHelped increase awareness of feelings, thoughts and behaviorsHelped patient acquire skills to build self-confidenceHelped patient identify strengthsHelped patient increase ability to gain support from othersHelped patient increase comfort with othersHelped patient increase interpersonal effectivenessHelped patient increase sense of acceptance by othersProvided assistance problem-solving challengesProvided positive reinforcement for successesProvided reinforcement for patient contributions to othersProvided reinforcement for use of adaptive coping skills Attendance            Partial attendance due to MD/APRN Meeting.  Total number of minutes attended:  32Engagement InterventionsNot applicable - patient attended group without difficulty Degree of Participation Moderate Behavior Appropriate, Cooperative and Oriented Speech/Thought Process Organized and Relevant Affect/Mood Anxious and Stable Insight Moderate Judgment Moderate Response to Interventions Attentive, Interested and Receptive Individualization Today?s second group was conducted virtually through a  ZOOM meeting and had participants share in detail more about how they managed their feelings, emotional discomfort and/or any urges they felt over the weekend. Patient joined group late after having met with APRN.  Patient shared that she struggles with managing her anxiety and continues to use marijuana.  Patient said she has reduced her use significantly and seemed proud to share her upcoming lab results will reflect her decreased use. Patient asked for feedback from group about ways to intake less marijuana by using different methods of kinds of smoking options.  One peer responded that weaning is just a way to avoid the need to stop completely.  Patient seemed anxious and in agreement.  She stated that she can't remember that last day I didn't smoke weed.  Writer and grouped offered support and encouraged her to stop using while in IOP so as to be able to get support and reflect in groups.  Patient did not express any suicidal or homicidal thoughts, plan or intent during this group. Plan Continue to assist patient with skills to improve functionContinue to assist patient with skills to manage symptomsContinue to reinforce use of positive coping skillsContinue to review and assess symptoms / issuesContinue to review and assess treatment goals / skill useContinue per Plan of Care / Interdisciplinary Plan of Care Sobia Karger, LCSW5/24/20218:58 AM

## 2019-08-03 NOTE — Telephone Encounter
Clinician called leaving second voicemail message for [facility] worker F. Baldwin [place] [phone] and received voicemail left a message with his direct line requesting return call.  This Clinical research associate reported we received a release of information signed by the patient in the mail and thanked her for having patient complete this.  Clinician reported he was free unavailable to talk and provided his contact information for return call when she was free to communicate about patient's case.  Clinician to follow up ongoing as needed to make contact and coordinate care with DCF.Cindie Crumbly, LCSW5/25/202110:57 AM

## 2019-08-04 ENCOUNTER — Inpatient Hospital Stay: Admit: 2019-08-04 | Discharge: 2019-08-04 | Payer: MEDICAID | Primary: Cardiovascular Disease

## 2019-08-04 DIAGNOSIS — F3189 Other bipolar disorder: Secondary | ICD-10-CM

## 2019-08-04 DIAGNOSIS — F411 Generalized anxiety disorder: Secondary | ICD-10-CM

## 2019-08-04 DIAGNOSIS — F332 Major depressive disorder, recurrent severe without psychotic features: Secondary | ICD-10-CM

## 2019-08-04 DIAGNOSIS — F1210 Cannabis abuse, uncomplicated: Secondary | ICD-10-CM

## 2019-08-04 NOTE — Telephone Encounter
Clinician spoke with patient about attendance and participation in treatment.  Patient reported she had a cough last night and was very hot in her house and she had difficulty sleeping.  Patient denied any other symptomatology.  Clinician spoke about DCF contact his concern if patient does not complete the program successfully.  Clinician discussed the patient must return phone calls, attend consistently, and follow all program requirements and guidelines.  Clinician reported the patient has almost completed her treatment and he would like to see her successful.  Patient also agree she wants to be successful at completing treatment.  This Clinical research associate reported that if she works towards successful completion to discharge plan will be set up with a private medication management provider who also does counseling in her office so the patient can keep her appointments as opposed to a clinic location that quickly closes cases when patient miss 1 or 2 appointments.  Clinician reports that she cannot miss appointments with this private provider however he believes she will be more successful in this venue than others in the community.  Clinician also reported that if her case closes for noncompliance this writer will report that to [facility] and that they will most likely reopen her case will return in her life; patient has a 1 year open DCF case.  Clinician reported his concerns with her marijuana use which has increased since the last toxicology screen.  Clinician also discussed with her people, places and things and their effect on her goals and priorities.  Clinician encouraged patient to think of her child and what will be the best future for him which must 1st be her taking care of her mental health and substance needs to provide the best care and modeling possible so that he may have a different childhood experience than she had.  Patient was receptive to this type feedback as it was ego support and concrete and clear. Clinician reported that if patient no call no-shows or leaves early for another treatment day she is at risk being discharged for noncompliance/non adherence to treatment recommendations.  Clinician reported if that is the case no discharge plan will be set up for and she will be on her own to figure out where she will follow up for medication management.  Clinician reiterated he wants to see patient complete successfully and do well in her life and that he would assist her in any way possible that she needs to 'put in the work'.  Patient reports she will follow above-mentioned recommendations.  Clinician to follow up with LIP and clinical coordinator ongoing as needed.Cindie Crumbly, LCSW5/26/20216:20 PM

## 2019-08-04 NOTE — Telephone Encounter
Clinician called attempted to leave 3rd voicemail message for [facility] worker F.?Baldwin [place] [phone] and received voicemail with a message stating that her voicemail was full in a message could not be left.  Clinician to follow up ongoing as needed to make contact and coordinate care with DCF.Cindie Crumbly, LCSW5/26/20218:29 AM

## 2019-08-04 NOTE — Other
Patient was a no call no-show for IOP treatment today.  Reception staff called patient to inquire about her absence and she reported not physically feeling well.  She was requested to leave message for this writer and which patient stated ?not feeling well physically was up all night and so was my son so I was not feeling well enough to attend today please give me a call.  Patient reported she would be in attendance for IOP treatment for her next scheduled day tomorrow.At 1:30 p.m. this writer attempted to call patient and was forwarded to a voicemail.  Clinician left message requesting patient return call to this writer to discuss attendance.  Clinician to follow up with patient imminent.Cindie Crumbly, LCSW5/26/20211:35 PM

## 2019-08-04 NOTE — Telephone Encounter
DCF worker?F.?Baldwin Jamaica [phone] made contact with this writer thing clinician presented clinical information on patient's progress in treatment, difficulties with child care but patient is juggling tasks does appear to attend to her child appropriately.  In addition clinician discussed patient's diagnosis, medications, recent lithium toxicity, and need for ongoing care to address her medication titration.  Clinician also discussed ongoing co construction of discharge in recommending med management and individual counseling with a strong focus on recommending patient keep connected to medication management provider as this is paramount in keeping her mood stable her life in order.  Discussed patient's presentation on medication and level of stability at this time compared to intake.  DCF shared their clinical feedback as well as experiences with patient's family.  Clinician agreed that patient sounds better managed in her grandmother's home than her mother patient has a conflictual relationship with.  DCF request letter with above-mentioned information can towards discharging patient from their care.  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  Clinician discussed conflicts with timing of program and jobs but the patient could freely get a job on the weekend or outside of IOP treatment hours.  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  She also reported the patient reports she stays up into the evening after child goes to sleep to complete her school homework and this Clinical research associate verified the patient's child is quite active on the screen which also impacts patient's full participation in IOP treatment.  Overall patient seems to be managing and balancing life well on her current medication regiment and appears to be stable working towards discharge.  Clinician to follow up with DCF via sending letter calling leaving a message that the letter was faxed to the main office as worker is working virtually and in-person.  No follow-up with DCF expected at this time other than letter.Cindie Crumbly, LCSW5/26/20219:35 AM

## 2019-08-05 ENCOUNTER — Inpatient Hospital Stay: Admit: 2019-08-05 | Discharge: 2019-08-05 | Payer: MEDICAID | Primary: Cardiovascular Disease

## 2019-08-05 DIAGNOSIS — R5383 Other fatigue: Secondary | ICD-10-CM

## 2019-08-05 MED ORDER — QUETIAPINE IMMEDIATE RELEASE 100 MG TABLET
100 mg | ORAL_TABLET | Freq: Every evening | ORAL | 1 refills | Status: AC
Start: 2019-08-05 — End: 2019-08-11

## 2019-08-05 MED ORDER — BENZTROPINE 1 MG TABLET
1 mg | ORAL_TABLET | Freq: Two times a day (BID) | ORAL | 1 refills | Status: AC
Start: 2019-08-05 — End: 2019-08-11

## 2019-08-05 NOTE — Progress Notes
Writer called and spoke with Michiel Cowboy LPN [phone] to inform her about medication changes. Decrease seroquel  to 50mg   By mouth nightly.(Pt to take half of a 100mg  tablet) Increase cogentin to 1mg  by mouth twice daily. Lizette verbally acknowledged understanding. Vance Gather, RN5/27/20213:22 PM

## 2019-08-05 NOTE — Other
Group Session:  Group Therapy Group Start Time:   10:00 AM      End Time:  11:00 AMFacilitators:  Cindie Crumbly, LCSW; [place], [place], PCTVIDEO TELEHEALTH VISIT, PLATFORM OTHER THAN MYCHART: This clinician is conducting this visit at a site within our enrolled clinical location.Marland Kitchen ?For this visit the clinician and patient were present via interactive audio & video telecommunications system that permits real-time communications, via a platform other than MyChart.?Platforms other than MyChart may be used only during the COVID-19 crisis, when MyChart is not an option, and video is required for clinical reasons. ?Reason for choice of video platform other than MyChart: Currently [facility] has identified Zoom as the appropriate telehealth platform to facilitate IOP Group Therapy.?PLEASE NOTE: CONSENT FOR THIS VISIT IS DIFFERENT THAN FOR MYCHART VIDEO VISITS! ?Verbal consent must be obtained by the clinician or staff prior to starting visit.??Read the Following Statements to Video Visit Participants:????(Name of clinician) will be conducting your video visit today. Before we begin, we must obtain your consent for today's visit.Can you confirm your name, date of birth, and state where you (or your child, if applicable) are currently located?By consenting, you understand that today's visit will be conducted through video conferencing technology other than our usual secure service. While we do everything possible to ensure your privacy, we may not be able to guarantee the security of this particular platform. You understand that the visit will not be recorded, and that we will be documenting this visit in our electronic medical record, and that we may disclose records concerning this interaction to other clinicians. You understand that a video visit may limit our ability to diagnose your condition.??You understand that if you are experiencing a medical emergency, you will be directed to dial 9-1-1 as we are not able to connect you directly. You also understand if your condition changes after this visit and you need of further care, you will contact our office. Do you agree to this consent, knowing that you can change your decision at any time? Do you have any other questions before we proceed???Patient consent given for video visit: yes?State patient is located in: [place] At home?LocationThe clinician is appropriately licensed in the above state to provide care for this visit.??Session Focus? Connection between feelings, thoughts and behaviorsDevelop skills to manage issues and improve functioningStatus of symptoms / issuesTreatment goal and skill useDaily goal setting Number of Participants? 6 Treatment Modality? Group Therapy Interventions/Education? Assisted patient addressing stressful life situationsAssisted patient with discharge planningAssisted patient with strategies for addressing peer issuesAssisted patient with strategies for managing illness/symptomsAssisted patient with terminationEncouraged increased cooperation with othersEncouraged increased initiation in groupsEncouraged patient to increase treatment focusEncouraged patient to verbalize, rather than enactEncourages patient to provide constructive feedback to othersAssisted patient with strategies for addressing family issuesAssisted patient with strategies for addressing school issuesEncouraged acceptance, tolerance and respect for othersEncouraged appropriate expression of thoughts and feelingsEncouraged improved listening skillsEncouraged patient to request as-needed help solving problemsHelped decrease isolation/increase sense of belongingHelped identify feelings, thoughts and behaviors connectionHelped increase awareness of feelings, thoughts and behaviorsHelped patient acquire skills to build self-confidenceHelped patient identify strengthsHelped patient increase ability to gain support from othersHelped patient increase comfort with othersHelped patient increase interpersonal effectivenessHelped patient increase sense of acceptance by othersProvided assistance problem-solving challengesProvided positive reinforcement for successesProvided reinforcement for patient contributions to othersProvided reinforcement for use of adaptive coping skills ?Attendance???????????? Partial attendance due to meeting with APRN.  Total minutes attended: 40Engagement InterventionsNot applicable - patient attended group without difficulty Degree of Participation? Active  Behavior Alert, Cooperative, and Interactive Speech/Thought Process Coherent, Directed and Relevant Affect/Mood Anxious, Depressed and Irritable Insight Moderate Judgment Moderate Response to Interventions? Attentive, Engaged, Interested and Receptive Individualization? SUMMARY NOTE Group 1:During IOP Group Therapy via ZOOM Patient self-reported on a scale of 0 = None to 10= Highest; patient described their most concerning symptomatology as; depression 3, irritability 3 rumination 4, racing thoughts 3, anxiety 4, sleep problems 2, drug cravings 4 and 0 for all other assessed symptomatology.  Patient met with APRN please see note.  Patient report was congruent with the patient's observed presentation.  Patient reports goal for the week as:  Achieved as she reported keeping busy and doing different things throughout the week.  Patient reports now the school is over she has a lot more free time and does not know what to do with this free time.  Patient identifies things that went well/grateful for; being better mentally, the pills and medication and taking, my family and my son and I am not compulsive I am not picking my face is much.  Patient identified purposely working on the following skill; patient reports she is trying to get out of the house more would like to start working more as she has some type of part-time job but like more formal employment. Patient reports to be taking their medication as prescribed and reports no concerns noted at this time. Patient reports currently to not be experiencing any ideation, intent, or plans in reference to suicidal or homicidal symptomatology. ? Plan? Continue to assist patient with skills to improve functionContinue to assist patient with skills to manage symptomsContinue to reinforce use of positive coping skillsContinue to review and assess symptoms / issuesContinue to review and assess treatment goals / skill useContinue per Plan of Care / Interdisciplinary Plan of Care ??C-SSRS Since Last Visit? ? [place] - Suicide Severity Screen? ? Most Recent Value Have you wished you were dead or wished you could go to sleep and not wake up? ?No Have you actually had any thoughts of killing yourself?  ?No Have you been thinking about how you might kill yourself?  ?No [place] Suicide Risk Level ?Low Risk ??Cindie Crumbly, LCSW5/27/20216:02 PM

## 2019-08-05 NOTE — Group Note
Group Session:  Group TherapyGroup Start Time:   12:10 PM      End Time:   1:00 PMFacilitators:  Cristino, Dorene Grebe, LCSW VIDEO TELEHEALTH VISIT, PLATFORM OTHER THAN MYCHART: This clinician is conducting this visit at a site within our enrolled clinical location..  For this visit the clinician and patient were present via interactive audio & video telecommunications system that permits real-time communications, via a platform other than MyChart.Platforms other than MyChart may be used only during the COVID-19 crisis, when MyChart is not an option, and video is required for clinical reasons. Reason for choice of video platform other than MyChart: Currently [facility] has identified Zoom as the appropriate telehealth platform to facilitate IOP Group Therapy.PLEASE NOTE: CONSENT FOR THIS VISIT IS DIFFERENT THAN FOR MYCHART VIDEO VISITS! Verbal consent must be obtained by the clinician or staff prior to starting visit.  Read the Following Statements to Video Visit Participants:  ?(Name of clinician) will be conducting your video visit today. Before we begin, we must obtain your consent for today's visit.Can you confirm your name, date of birth, and state where you (or your child, if applicable) are currently located?By consenting, you understand that today's visit will be conducted through video conferencing technology other than our usual secure service. While we do everything possible to ensure your privacy, we may not be able to guarantee the security of this particular platform. You understand that the visit will not be recorded, and that we will be documenting this visit in our electronic medical record, and that we may disclose records concerning this interaction to other clinicians. You understand that a video visit may limit our ability to diagnose your condition.  You understand that if you are experiencing a medical emergency, you will be directed to dial 9-1-1 as we are not able to connect you directly. You also understand if your condition changes after this visit and you need of further care, you will contact our office. Do you agree to this consent, knowing that you can change your decision at any time? Do you have any other questions before we proceed??Patient consent given for video visit: yes State patient is located in:  At [facility] clinician is appropriately licensed in the above state to provide care for this visit.Other individuals present during the telehealth encounter and their role/relation: Identified group facilitator(s), support staff as needed, and fellow group participants.Session Focus Connection between feelings, thoughts and behaviorsDevelop skills to manage issues and improve functioningStatus of symptoms / issuesTreatment goal and skill useDaily goal setting Number of Participants 6 Treatment Modality Group Therapy Interventions/Education Assisted patient addressing stressful life situationsAssisted patient with strategies for addressing peer issuesAssisted patient with strategies for managing illness/symptomsEncouraged increased cooperation with othersEncouraged increased initiation in groupsEncouraged patient to increase treatment focusEncouraged patient to verbalize, rather than enactEncourages patient to provide constructive feedback to othersAssisted patient with strategies for addressing family issuesAssisted patient with strategies for addressing school issuesEncouraged acceptance, tolerance and respect for othersEncouraged appropriate expression of thoughts and feelingsEncouraged improved listening skillsEncouraged patient to request as-needed help solving problemsHelped decrease isolation/increase sense of belongingHelped identify feelings, thoughts and behaviors connectionHelped increase awareness of feelings, thoughts and behaviorsHelped patient acquire skills to build self-confidenceHelped patient identify strengthsHelped patient increase ability to gain support from othersHelped patient increase comfort with othersHelped patient increase interpersonal effectivenessHelped patient increase sense of acceptance by othersProvided assistance problem-solving challengesProvided positive reinforcement for successesProvided reinforcement for patient contributions to othersProvided reinforcement for use of adaptive coping skills Attendance  Partial attendance due to late arrival - other.  Total number of minutes attended:  45Engagement InterventionsNot applicable - patient attended group without difficulty Degree of Participation Moderate Behavior Alert, Cooperative and Interactive Speech/Thought Process Coherent, Directed and Relevant Affect/Mood Anxious and Full range Insight Fair Judgment Moderate and Fair Response to Interventions Attentive, Engaged, Interested and Receptive Individualization Group # 3Breaking Down BarriersGroup members discussed in general what barriers may exist in one?s life. Some examples of barriers were discussed. Group members then were asked to specifically identify his or her biggest barrier(s), what that specific barrier(s) prevents them from, and how he or she might confront this barrier. Patient identified apathy and rumination as her biggest barriers.  Patient reported these barriers preventing her from not taking action and/or avoiding.  Patient had difficulty identifying how to confront this barrier.  She was receptive to feedback from others. Plan Continue to assist patient with skills to improve functionContinue to assist patient with skills to manage symptomsContinue to reinforce use of positive coping skillsContinue to review and assess symptoms / issuesContinue to review and assess treatment goals / skill useContinue per Plan of Care / Interdisciplinary Plan of Care Natalie Cristino, LCSW5/19/20213:17 PM

## 2019-08-05 NOTE — Other
Group Session:  Group Therapy Group Start Time:   12:10 PM      End Time:  1:00 PMFacilitators:  Cindie Crumbly, LCSWVIDEO TELEHEALTH VISIT, PLATFORM OTHER THAN MYCHART: This clinician is conducting this visit at a site within our enrolled clinical location.Marland Kitchen ?For this visit the clinician and patient were present via interactive audio & video telecommunications system that permits real-time communications, via a platform other than MyChart.?Platforms other than MyChart may be used only during the COVID-19 crisis, when MyChart is not an option, and video is required for clinical reasons. ?Reason for choice of video platform other than MyChart: Currently [facility] has identified Zoom as the appropriate telehealth platform to facilitate IOP Group Therapy.?PLEASE NOTE: CONSENT FOR THIS VISIT IS DIFFERENT THAN FOR MYCHART VIDEO VISITS! ?Verbal consent must be obtained by the clinician or staff prior to starting visit.??Read the Following Statements to Video Visit Participants:????(Name of clinician) will be conducting your video visit today. Before we begin, we must obtain your consent for today's visit.Can you confirm your name, date of birth, and state where you (or your child, if applicable) are currently located?By consenting, you understand that today's visit will be conducted through video conferencing technology other than our usual secure service. While we do everything possible to ensure your privacy, we may not be able to guarantee the security of this particular platform. You understand that the visit will not be recorded, and that we will be documenting this visit in our electronic medical record, and that we may disclose records concerning this interaction to other clinicians. You understand that a video visit may limit our ability to diagnose your condition.??You understand that if you are experiencing a medical emergency, you will be directed to dial 9-1-1 as we are not able to connect you directly. You also understand if your condition changes after this visit and you need of further care, you will contact our office. Do you agree to this consent, knowing that you can change your decision at any time? Do you have any other questions before we proceed???Patient consent given for video visit: yes?State patient is located in: [place] At home?LocationThe clinician is appropriately licensed in the above state to provide care for this visit.??Session Focus? Connection between feelings, thoughts and behaviorsDevelop skills to manage issues and improve functioningStatus of symptoms / issuesTreatment goal and skill useDaily goal setting Number of Participants? 6 Treatment Modality? Group Therapy Interventions/Education? Assisted patient addressing stressful life situationsAssisted patient with discharge planningAssisted patient with strategies for addressing peer issuesAssisted patient with strategies for managing illness/symptomsAssisted patient with terminationEncouraged increased cooperation with othersEncouraged increased initiation in groupsEncouraged patient to increase treatment focusEncouraged patient to verbalize, rather than enactEncourages patient to provide constructive feedback to othersAssisted patient with strategies for addressing family issuesAssisted patient with strategies for addressing school issuesEncouraged acceptance, tolerance and respect for othersEncouraged appropriate expression of thoughts and feelingsEncouraged improved listening skillsEncouraged patient to request as-needed help solving problemsHelped decrease isolation/increase sense of belongingHelped identify feelings, thoughts and behaviors connectionHelped increase awareness of feelings, thoughts and behaviorsHelped patient acquire skills to build self-confidenceHelped patient identify strengthsHelped patient increase ability to gain support from othersHelped patient increase comfort with othersHelped patient increase interpersonal effectivenessHelped patient increase sense of acceptance by othersProvided assistance problem-solving challengesProvided positive reinforcement for successesProvided reinforcement for patient contributions to othersProvided reinforcement for use of adaptive coping skills ?Attendance???????????? AttendedEngagement InterventionsNot applicable - patient attended group without difficulty Degree of Participation? Active Behavior Alert, Cooperative, and Interactive Speech/Thought Process Coherent, Directed and Relevant Affect/Mood Anxious, Depressed  and Irritable Insight Moderate Judgment Moderate Response to Interventions? Attentive, Engaged, Interested and Receptive Individualization?  SUMMARY NOTE Group 3:During IOP Group Therapy via ZOOM Patients discussed weekend planning.  Clinician advised patient's when, how to use a safety plan, and individualized their safety plans.  Patient prompted to discuss any anticipated conflicts or concerns they had about their weekend and prompted patient is to give and receive feedback on how they may work on resolving or staying safe in dealing with them highlighting utilizing coping skills learned in IOP.  Specifically this patient reported she does not have formalized plans but would like to go to the gym and has not been in 10 days, would like to go to the beach or park with her son.  Patient discussed dealing with marijuana cravings how she is trying to get through her day.  Clinician discussed openly with the group and patient consistently attending and following through with treatment to avoid future consequences.  Patient reports that [facility] got back involved in her life after a suicide attempt and that there closing her case soon ?I am very anxious about the meeting today even know I think it will go okay?.  Clinician also discussed patient putting herself and her child 1st in her thoughts as far as substances, relationships, and other things she needs to take into account making good decisions as a parent.  Patient agreed ?marijuana is not a good look I say that all the time and I hide it?.  Clinician discussed the embarrassment, guilt and, shame in general that goes along with substance use. Patient reports currently to not be experiencing any ideation, intent, or plans in reference to suicidal or homicidal symptomatology.? Plan? Continue to assist patient with skills to improve functionContinue to assist patient with skills to manage symptomsContinue to reinforce use of positive coping skillsContinue to review and assess symptoms / issuesContinue to review and assess treatment goals / skill useContinue per Plan of Care / Interdisciplinary Plan of Care ?Cindie Crumbly, LCSW5/27/20216:04 PM

## 2019-08-05 NOTE — Group Note
Group Session 2:  Group TherapyGroup Start Time:   11:10 AM      End Time:  12:00 PMFacilitators:  Valla Pacey, LCSWVIDEO TELEHEALTH VISIT, PLATFORM OTHER THAN MYCHART: This clinician is conducting this visit at a site within our enrolled clinical location..  For this visit the clinician and patient were present via interactive audio & video telecommunications system that permits real-time communications, via a platform other than MyChart.Platforms other than MyChart may be used only during the COVID-19 crisis, when MyChart is not an option, and video is required for clinical reasons. Reason for choice of video platform other than MyChart: Currently [facility] has identified Zoom as the appropriate telehealth platform to facilitate IOP Group Therapy.PLEASE NOTE: CONSENT FOR THIS VISIT IS DIFFERENT THAN FOR MYCHART VIDEO VISITS! Verbal consent must be obtained by the clinician or staff prior to starting visit.  Read the Following Statements to Video Visit Participants:  ?(Name of clinician) will be conducting your video visit today. Before we begin, we must obtain your consent for today's visit.Can you confirm your name, date of birth, and state where you (or your child, if applicable) are currently located?By consenting, you understand that today's visit will be conducted through video conferencing technology other than our usual secure service. While we do everything possible to ensure your privacy, we may not be able to guarantee the security of this particular platform. You understand that the visit will not be recorded, and that we will be documenting this visit in our electronic medical record, and that we may disclose records concerning this interaction to other clinicians. You understand that a video visit may limit our ability to diagnose your condition.  You understand that if you are experiencing a medical emergency, you will be directed to dial 9-1-1 as we are not able to connect you directly. You also understand if your condition changes after this visit and you need of further care, you will contact our office. Do you agree to this consent, knowing that you can change your decision at any time? Do you have any other questions before we proceed??Patient consent given for video visit: yesState patient is located in: [place]: at patient's home addressThe clinician is appropriately licensed in the above state to provide care for this visit.Other individuals present during the telehealth encounter and their role/relation: Identified group facilitator(s), support staff as needed, and fellow group participants.Session Focus Connection between feelings, thoughts and behaviorsDevelop skills to manage issues and improve functioning Number of Participants 6 Treatment Modality Group Therapy and Psycho-education Interventions/Education Assisted patient addressing stressful life situationsAssisted patient with strategies for addressing peer issuesAssisted patient with strategies for managing illness/symptomsHelped decrease isolation/increase sense of belongingHelped identify feelings, thoughts and behaviors connectionHelped increase awareness of feelings, thoughts and behaviorsHelped patient acquire skills to build self-confidenceHelped patient identify strengthsHelped patient increase ability to gain support from othersHelped patient increase comfort with othersHelped patient increase interpersonal effectivenessHelped patient increase sense of acceptance by othersProvided assistance problem-solving challengesProvided positive reinforcement for successesProvided reinforcement for patient contributions to othersProvided reinforcement for use of adaptive coping skills Attendance            AttendedEngagement InterventionsNot applicable - patient attended group without difficulty Degree of Participation Moderate Behavior Alert, Appropriate and Oriented Speech/Thought Process Coherent and Organized Affect/Mood Euthymic and Stable Insight Moderate Judgment Fair Response to Interventions Attentive and Engaged Individualization Today?s second group was conducted virtually through a ZOOM.  Writer introduced the topic of boundaries and read an excerpt on what are Emotional and Intellectual Boundaries  including examples of, barriers to setting them and what healthy boundaries allow for.  Patient shared that she is working on developing better boundaries in her current relationship.  She shared that she feels insecure at times at to if he's equally committed to her.  She said she is giving him a chance to get his life together but she doesn't want to be taking care of him if he can't.  Patient said she's been actively reducing her marijuana use. She used once rather than her usual four times yesterday.  She said she doesn't feel ready to give up using marijuana to help her sleep at this time.Patient did not express any suicidal or homicidal thoughts, plan or intent during this group. Plan Continue to assist patient with skills to improve functionContinue to assist patient with skills to manage symptomsContinue to reinforce use of positive coping skillsContinue to review and assess symptoms / issuesContinue to review and assess treatment goals / skill useContinue per Plan of Care / Interdisciplinary Plan of Care Zoe Scott, LCSW5/27/20211:46 PM

## 2019-08-05 NOTE — Care Coordination-Inpatient
Received alert on epic that patient's lithium level was 2.0.  At approximately 9:30 a.m. Contacted nurse Aloha Gell as well as a p.r.n. and Shanda Bumps Dinisco who reached out to patient to instruct patient on how to proceed holding lithium dose and reducing dose with repeat level as well as assessment of toxicity symptoms.Huntley Estelle, MD5/21/202111:13 AM

## 2019-08-05 NOTE — Progress Notes
VIDEO TELEHEALTH VISIT, PLATFORM OTHER THAN MYCHART: This clinician is conducting this visit at a site within our enrolled clinical location..  For this visit the clinician and patient were present via interactive audio & video telecommunications system that permits real-time communications, via a platform other than MyChart.Platforms other than MyChart may be used only during the COVID-19 crisis, when MyChart is not an option, and video is required for clinical reasons. Reason for choice of video platform other than MyChart: Patient unable to access MyChart Video secondary to currently being in [employer] via [employer].PLEASE NOTE: CONSENT FOR THIS VISIT IS DIFFERENT THAN FOR MYCHART VIDEO VISITS! Verbal consent must be obtained by the clinician or staff prior to starting visit.  Read the Following Statements to Video Visit Participants:  ?(Name of clinician) will be conducting your video visit today. Before we begin, we must obtain your consent for today's visit.Can you confirm your name, date of birth, and state where you (or your child, if applicable) are currently located?By consenting, you understand that today's visit will be conducted through video conferencing technology other than our usual secure service. While we do everything possible to ensure your privacy, we may not be able to guarantee the security of this particular platform. You understand that the visit will not be recorded, and that we will be documenting this visit in our electronic medical record, and that we may disclose records concerning this interaction to other clinicians. You understand that a video visit may limit our ability to diagnose your condition.  You understand that if you are experiencing a medical emergency, you will be directed to dial 9-1-1 as we are not able to connect you directly. You also understand if your condition changes after this visit and you need of further care, you will contact our office. Do you agree to this consent, knowing that you can change your decision at any time? Do you have any other questions before we proceed??Patient consent given for video visit: yesState patient is located in: [place] @ home address The clinician is appropriately licensed in the above state to provide care for this visit.Other individuals present during the telehealth encounter and their role/relation: noneTotal time spent in medical video consultation (required if billing based on time): 20 minutes  Start Time: 10:25  End Time: 10:45[facility] [phone] AvenueBridgeport [place] [address]WVPXT Number: [phone] Number: [phone] Outpatient Psychiatry MD/LIP Progress Note5/27/2021Start Time:  10:25      End Time:  10:45Session Type:  Medication ManagementAmanda B Scott is a 24 y.o., Single, female.SUBJECTIVE Chief Complaint: I feel really jittery.  Interim History:  24 year old female with past psychiatric history of bipolar disrder (manic and depressive) and a history of suicide attempts and anxiety disorder referred to [facility] adult IOP by [facility] following an inpatient admission for worsening depression and anxiety symptoms at the beginning of March 2021. However, she finished a 1/2 of a group and then left the program. On Thursday, 06/17/2019, [name] called REACH requesting a refill on her medication.  She reported feeling sick and very nauseated and lethargic. She was given a refill and started IOP on, 06/21/19. Last week Lulla's lithium level came back as 2.0. Lithium was held for two days and then restarted at 600mg  in AM. Repeated lithium level on 08/02/19: 0.24. Her current medications include: Lithium 600mg  in the AM Vraylar 4.5mg  nightly Seroquel 100mg  nightly (plan to decrease) Zolpidem 5mg  nightly as needed for sleep Cogentin  1mg  nightly Amanada reports feeling jittery and restless. She reports difficulty sitting still and feels impatient and a sense of a little anxiety. She reports, the cogentin  Helped a little. She reports, I have more control over my mood now but it is still hot and cold. I am good then I think about something and it kills my mood. She reports, with time it goes away. She reports decreased motivation and energy overall and that it is hard to get through the day. She reports sleeping about to 6 hours per night and waking because her son wakes up more. She reports, my appetite is lower. I am hungry but can't eat. Margreat reports since decreasing lithium her appetite is lower. Discussed stress relief and exercises to help reduce stress for her body. She verbalized agreement to try these exercises. Adean repots that anxiety is good and a lot better than what it was but still there. She reports no panic attacks. She reports no suicidal thoughts since the last visit. Julya reports no symptoms of mania: no racing thoughts, no decreased need for sleep, no increased impulsivity, no increased impulsivity. Plan: Continue IOPDecrease seroquel 50mg  at night Increase cogentin 1mg  twice a day Continue vraylar 4.5mg  Continue zolpidem 5mg  nightly as neededContinue  lithium 600mg  in AMContinue hydroxyzine 25mg  every four hours as needed for anxiety ?REVIEW OF ALLERGIES/MEDICATIONS/HISTORY I have reviewed the patient's current medications and allergiesI have reviewed with the patient, the risks and benefits of the medications prescribed.OBJECTIVE Review of SystemsReview of Systems Psychiatric/Behavioral: Positive for dysphoric mood. The patient is nervous/anxious.  All other systems reviewed and are negative. LabsNo new labsCurrent MedicationsCurrent Outpatient Medications Medication Sig ? benztropine (COGENTIN) 1 mg tablet Take 1 tablet (1 mg total) by mouth nightly. ? cariprazine 4.5 mg Cap Take 1 capsule (4.5 mg total) by mouth daily. ? lithium carbonate (LITHOBID CR) 300 mg CR extended release tablet Take 2 tablets (600 mg total) by mouth every morning. Re-start on 08/01/19 ? QUEtiapine (SEROQUEL) 100 mg Immediate Release tablet Take 100 mg by mouth nightly. ? zolpidem (AMBIEN) 5 mg tablet Take 1 tablet (5 mg total) by mouth nightly as needed for sleep. No current facility-administered medications for this encounter.  Mental Status ExamGeneral AppearanceHabitus:  MediumGrooming:  GoodMusculoskeletalStrength and Tone: Strength normalGait and Station: Stable gait and stable posturePsychiatricAttitude: Cooperative, good eye contact and pleasantPsychomotor Behavior: restlessSpeech: normal rate, volume and prosodyMood: Calm not discouraged   Patient reported mood:  I feel goodAffect: Congruent to reported moodThought Process: Coherent, logical, goal directedAssociations: NormalThought Content: Normal no auditory hallucinations, no command auditory hallucinations, no paranoid delusions, not perseverativeSuicidal Ideation: No current suicidal plan, ideation or intentHomicidal Ideation: No current homicidal ideation, plan or intentJudgment:  GoodInsight:  GoodCognitive EvaluationOrientation: Oriented to person, oriented to place and oriented to date/time Attention and Concentration:  Normal attention and concentrationMemory: Recent and remote memory intactLanguage:  Language intactFund of Knowledge: average.Abstract Reasoning: Normal capacity for abstract reasoningSafety and Risk AssessmentPSY RISK ASSESSMENT SAFE-T WITH C-SSRS C-SSRS: Suicidal Ideation:  Since Last Assessment  WISH TO BE DEAD:  No  CURRENT SUICIDAL THOUGHTS:  No  SUICIDAL THOUGHTS WITH METHOD:  No  SUICIDAL INTENT WITHOUT SPECIFIC PLAN:  No INTENT WITH PLAN:  No  Have you ever done anything, started to do anything or prepared to do anything to end your life?:  Yes  Was it within the past 3 months?:  NoSuicidal Ideation Intensity :   FREQUENCY:  2-5 times a week  DURATION:  Fleeting - few seconds or  minutes  CONTROLLABILITY:  Easily able to control thoughts  DETERRENTS:  Deterrents definitely stopped you from attempting suicide  REASONS FOR IDEATION:  Does not applyRisk Assessment:   Access to Lethal Methods:  NoCurrent and Past Psychiatric Diagnoses:    Mood Disorder:  Recurrent/Current  Psychotic Disorder:  No  Alcohol/Substance Abuse Disorders:  Recurrent/Current  PTSD:  No  ADHD:  No  TBI:  No  Cluster B Personality Disorders or Traits:  No  Conduct Problems:  No  Suicide Attempt:  No Prior Attempts  Presenting Symptoms:  Anhedonia:  Yes  Impulsivity:  No  Hopelessness or Despair:  No  Anxiety and/or Panic:  No  Insomnia:  No  Command Hallucinations:  No  Psychosis:  No  Self-injurious Behavior:  No  Physical Aggression Toward Others:  No  Violence, Victimization and/or Perpetration:  No  Family History:   Suicide:  No  Suicidal Behavior:  No  Axis I Psychiatric Diagnosis requiring hospitalization:  No  Precipitants / Stressors:   Triggering events leading to humiliation, shame and/or despair:  No  Chronic physical pain or other acute medical problem:  No  Sexual / Physical Abuse:  No  Substance Intoxication or Withdrawal:  No  Pending Incarceration:  No  Legal Problems:  No  Inadequate Social Supports:  No  Social Isolation:  No  Perceived burden on others:  No  Bullying / Cyberbullying:  No  Media portrayal of suicide:  No  Housing Insecurity:  No  Financial Insecurity:  No  Stressful Life Events:  Yes  Gender / Sexual Identity:  No  Homelessness:  No  Change in Treatment:    Recent inpatient discharge:  No  Change in provider or treatment:  No  Hopeless or dissatisfied with provider or treatment:  No  Non-compliant:  Yes  Not receiving treatment:  NoCurrent and Past Psychiatric Diagnoses:  Mood Disorder:  Recurrent/Current  Psychotic Disorder:  No  Alcohol/Substance Abuse Disorders:  Recurrent/Current  PTSD:  No  ADHD:  No  TBI:  No  Cluster B Personality Disorders or Traits:  No  Conduct Problems:  No  Suicide Attempt:  No Prior AttemptsProtective Factors:   Internal:   Ability to cope with stress:  Yes  Frustration tolerance:  Yes  Fear of death or the actual act of killing self:  Yes  Identifies reasons for living:  Yes  Problem solving skills:  Yes  External:   Responsibility to children:  Yes  Beloved pets:  Yes  Supportive social network of friends or family:  Yes  Positive therapeutic relationships / Effective mental healthcare:  YesRisk to Self - Self-Injurious Behavior:   Current Urges to harm Self:  Yes  Recent Self-Injury:  No  History of Self Injury:  No  Imminent Risk for Self Injury in Community:  Low  Imminent Risk for Self Injury in Facility:  LowRisk to Others:   Current Agitation:  No  Homicidal/Aggressive Ideation:  No  Homicidal/Aggressive Threat/Plan:  No  Recent Violence/Aggression:  NoWithdrawal Risk:   Alcohol / Benzodiazepines / Barbiturates:  Not applicable     Alcohol/Benzodiazepine/Barbiturate Risk for Withdrawal:  Absent  Opioids:  Not applicable     Opioid Risk for Withdrawal:  AbsentMEDICAL DECISION MAKING DiagnosisProblem List         ICD-10-CM   OP/IOP/PHP Psychiatry  MDD (major depressive disorder), recurrent severe, 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 Dawana reports ongoing restlessness and shakiness since increasing vraylar. Moods are more stable. Will continue to taper off seroquel and increase cogentin. Will continue to monitor mood/anxiety. Currently, no imminent safety concerns. The Self-Report [place] Risk Suicide Severity Rating Scale Since Last Visit was completed by Margarita Grizzle and reviewed by myself today.  Additionally, risk assessment from [facility] intake assessment and follow up screenings have been reviewed.  Based on Xitlalic B Groft?s clinical presentation today as well as her self-reported symptom ratings today and over the current course of treatment, I have assessed Margarita Grizzle to be at unchanged risk of harm to self as compared with prior recent assessments.  Based on this assessment, I have continued current treatment plan including implementation of factors to mitigate risks of harm to self as outlined in the last risk assessmentIntervention(s)Supportive therapy Labs/Test/Consults OrderedNone today Medication ChangesDecrease seroquel 50mg  at night Increase cogentin 1mg  twice a day   PlanDecrease seroquel 50mg  at night Increase cogentin 1mg  twice a day Continue vraylar 4.5mg  Continue zolpidem 5mg  nightly as neededContinue  lithium 600mg  in AMContinue hydroxyzine 25mg  every four hours as needed for anxiety Discussed side effects, pros & cons of treatment, and alternative treatment options. Reviewed safety plan to call the clinic, the National Suicide Prevention Lifeline: 678 708 4106, or  911 to go to the ER if safety is at risk. Not deemed at imminent risk of harm to self/others at this timePatient verbalized understanding and had no additional questions. Electronically Signed:Anberlin Diez, NP 08/05/2019 10:45 AM

## 2019-08-06 NOTE — Group Note
Group Session:  Group TherapyGroup Start Time:   10:00 AM      End Time:  11:00 AMFacilitators:  Ladawna Walgren, Shanda Bumps, NP; [place], [place], PCTVIDEO TELEHEALTH VISIT, PLATFORM OTHER THAN MYCHART: This clinician is conducting this visit at a site within our enrolled clinical location..  For this visit the clinician and patient were present via interactive audio & video telecommunications system that permits real-time communications, via a platform other than MyChart.Platforms other than MyChart may be used only during the COVID-19 crisis, when MyChart is not an option, and video is required for clinical reasons. Reason for choice of video platform other than MyChart: Currently [facility] has identified Zoom as the appropriate telehealth platform to facilitate IOP Group Therapy.PLEASE NOTE: CONSENT FOR THIS VISIT IS DIFFERENT THAN FOR MYCHART VIDEO VISITS! Verbal consent must be obtained by the clinician or staff prior to starting visit.  Read the Following Statements to Video Visit Participants:  ?(Name of clinician) will be conducting your video visit today. Before we begin, we must obtain your consent for today's visit.Can you confirm your name, date of birth, and state where you (or your child, if applicable) are currently located?By consenting, you understand that today's visit will be conducted through video conferencing technology other than our usual secure service. While we do everything possible to ensure your privacy, we may not be able to guarantee the security of this particular platform. You understand that the visit will not be recorded, and that we will be documenting this visit in our electronic medical record, and that we may disclose records concerning this interaction to other clinicians. You understand that a video visit may limit our ability to diagnose your condition.  You understand that if you are experiencing a medical emergency, you will be directed to dial 9-1-1 as we are not able to connect you directly. You also understand if your condition changes after this visit and you need of further care, you will contact our office. Do you agree to this consent, knowing that you can change your decision at any time? Do you have any other questions before we proceed??Patient consent given for video visit: yesState patient is located in: [place] @ home address The clinician is appropriately licensed in the above state to provide care for this visit.Other individuals present during the telehealth encounter and their role/relation: Identified group facilitator(s), support staff as needed, and fellow group participants. Session Focus Connection between feelings, thoughts and behaviors Number of Participants 6 Treatment Modality Group Therapy Interventions/Education Helped increase awareness of feelings, thoughts and behaviorsHelped patient acquire skills to build self-confidenceHelped patient increase ability to gain support from othersHelped patient increase interpersonal effectiveness Attendance            AttendedEngagement InterventionsNot applicable - patient attended group without difficulty Degree of Participation Moderate Behavior Appropriate and Cooperative Speech/Thought Process Coherent Affect/Mood Anxious Insight Good Judgment Good Response to Interventions Attentive and Engaged Individualization Tericka participated in the power versus powerlessness group. She participated in the didactic portion of the group, listening to peers. She participated in the writing exercise including identifying a situation where she feels powerless, behaviors she uses to find control/power (using cannabis to numb), underlying needs, and alternative/healthier options to reclaim a sense of power in managing her situation. She shared some of her personal insights with the group and remained open and receptive throughout the session.  Plan Continue to assist patient with skills to improve functionContinue to assist patient with skills to manage symptomsContinue to reinforce use of positive coping  skills

## 2019-08-09 ENCOUNTER — Ambulatory Visit: Admit: 2019-08-09 | Payer: PRIVATE HEALTH INSURANCE | Primary: Cardiovascular Disease

## 2019-08-09 DIAGNOSIS — R5383 Other fatigue: Secondary | ICD-10-CM

## 2019-08-09 DIAGNOSIS — F3189 Other bipolar disorder: Secondary | ICD-10-CM

## 2019-08-09 DIAGNOSIS — F411 Generalized anxiety disorder: Secondary | ICD-10-CM

## 2019-08-09 DIAGNOSIS — F1210 Cannabis abuse, uncomplicated: Secondary | ICD-10-CM

## 2019-08-09 DIAGNOSIS — F332 Major depressive disorder, recurrent severe without psychotic features: Secondary | ICD-10-CM

## 2019-08-11 ENCOUNTER — Inpatient Hospital Stay: Admit: 2019-08-11 | Discharge: 2019-08-11 | Payer: MEDICAID | Primary: Cardiovascular Disease

## 2019-08-11 DIAGNOSIS — F1210 Cannabis abuse, uncomplicated: Secondary | ICD-10-CM

## 2019-08-11 DIAGNOSIS — F411 Generalized anxiety disorder: Secondary | ICD-10-CM

## 2019-08-11 MED ORDER — LITHIUM CARBONATE ER 450 MG TABLET,EXTENDED RELEASE
450 mg | ORAL_TABLET | Freq: Two times a day (BID) | ORAL | 1 refills | Status: AC
Start: 2019-08-11 — End: 2019-08-24

## 2019-08-11 MED ORDER — LITHIUM CARBONATE ER 300 MG TABLET,EXTENDED RELEASE
300 mg | ORAL_TABLET | Freq: Every morning | ORAL | 1 refills | Status: AC
Start: 2019-08-11 — End: 2019-08-24

## 2019-08-11 MED ORDER — BENZTROPINE 1 MG TABLET
1 mg | ORAL_TABLET | Freq: Two times a day (BID) | ORAL | 1 refills | Status: AC
Start: 2019-08-11 — End: 2019-08-18

## 2019-08-11 MED ORDER — ZOLPIDEM 5 MG TABLET
5 mg | ORAL_TABLET | Freq: Every evening | ORAL | 1 refills | Status: AC | PRN
Start: 2019-08-11 — End: 2019-09-08

## 2019-08-11 MED ORDER — ZOLPIDEM 5 MG TABLET
5 mg | ORAL_TABLET | Freq: Every evening | ORAL | 1 refills | Status: AC | PRN
Start: 2019-08-11 — End: 2019-08-11

## 2019-08-11 NOTE — Other
Group Start Time:   10:00 AM      End Time:  11:00 AMFacilitators:  Cindie Crumbly, LCSWVIDEO TELEHEALTH VISIT, PLATFORM OTHER THAN MYCHART: This clinician is conducting this visit at a site within our enrolled clinical location.Marland Kitchen ?For this visit the clinician and patient were present via interactive audio & video telecommunications system that permits real-time communications, via a platform other than MyChart.?Platforms other than MyChart may be used only during the COVID-19 crisis, when MyChart is not an option, and video is required for clinical reasons. ?Reason for choice of video platform other than MyChart: Currently [facility] has identified Zoom as the appropriate telehealth platform to facilitate IOP Group Therapy.?PLEASE NOTE: CONSENT FOR THIS VISIT IS DIFFERENT THAN FOR MYCHART VIDEO VISITS! ?Verbal consent must be obtained by the clinician or staff prior to starting visit.??Read the Following Statements to Video Visit Participants:????(Name of clinician) will be conducting your video visit today. Before we begin, we must obtain your consent for today's visit.Can you confirm your name, date of birth, and state where you (or your child, if applicable) are currently located?By consenting, you understand that today's visit will be conducted through video conferencing technology other than our usual secure service. While we do everything possible to ensure your privacy, we may not be able to guarantee the security of this particular platform. You understand that the visit will not be recorded, and that we will be documenting this visit in our electronic medical record, and that we may disclose records concerning this interaction to other clinicians. You understand that a video visit may limit our ability to diagnose your condition.??You understand that if you are experiencing a medical emergency, you will be directed to dial 9-1-1 as we are not able to connect you directly. You also understand if your condition changes after this visit and you need of further care, you will contact our office. Do you agree to this consent, knowing that you can change your decision at any time? Do you have any other questions before we proceed???Patient consent given for video visit: yes?State patient is located in: [place] At home?LocationThe clinician is appropriately licensed in the above state to provide care for this visit.??Session Focus? Connection between feelings, thoughts and behaviorsDevelop skills to manage issues and improve functioningStatus of symptoms / issuesTreatment goal and skill useDaily goal setting Number of Participants? 6 Treatment Modality? Group Therapy Interventions/Education? Assisted patient addressing stressful life situationsAssisted patient with discharge planningAssisted patient with strategies for addressing peer issuesAssisted patient with strategies for managing illness/symptomsAssisted patient with terminationEncouraged increased cooperation with othersEncouraged increased initiation in groupsEncouraged patient to increase treatment focusEncouraged patient to verbalize, rather than enactEncourages patient to provide constructive feedback to othersAssisted patient with strategies for addressing family issuesAssisted patient with strategies for addressing school issuesEncouraged acceptance, tolerance and respect for othersEncouraged appropriate expression of thoughts and feelingsEncouraged improved listening skillsEncouraged patient to request as-needed help solving problemsHelped decrease isolation/increase sense of belongingHelped identify feelings, thoughts and behaviors connectionHelped increase awareness of feelings, thoughts and behaviorsHelped patient acquire skills to build self-confidenceHelped patient identify strengthsHelped patient increase ability to gain support from othersHelped patient increase comfort with othersHelped patient increase interpersonal effectivenessHelped patient increase sense of acceptance by othersProvided assistance problem-solving challengesProvided positive reinforcement for successesProvided reinforcement for patient contributions to othersProvided reinforcement for use of adaptive coping skills ?Attendance???????????? Partial attendance due to meeting with APRN.  Total minutes attended: 58Engagement InterventionsNot applicable - patient attended group without difficulty Degree of Participation? Active Behavior Alert, Cooperative, and Interactive Speech/Thought Process Coherent,  Directed and Relevant Affect/Mood Anxious, Depressed and Irritable Insight Moderate Judgment Moderate Response to Interventions? Attentive, Engaged, Interested and Receptive Individualization?  SUMMARY NOTE Group 1:During IOP Group Therapy via ZOOM Patient self-reported on a scale of 0 = None to 10= Highest; patient described their most concerning symptomatology as; depression 8, irritability 6, rumination 7, racing thoughts 6, anxiety 4, sleep problems 3, drug cravings 4 and 0 for all other assessed symptomatology.  Patient also identified having some passive suicidal thoughts over the weekend several times ?I just think like what is the point?; patient identified having a thought of cutting self-harm but did not act upon it.  Patient reports that she stop taking her meds for 1 day and that she believes her medications are being given to her inconsistently from her nurses cope with his ?3 different nurses they each come and change the box after each other and I do not even know what is right?. Patient report was congruent with the patient's observed presentation.  Patient appears more depressed reports having a hard time motivating herself she just wants to sleep ?I feel like my son sucks all the energy out of me, just feel like depression is affecting me more?.  Patient reports goal for the week as:  Organize my drawers and be less depressed.  Patient identified purposely working on the following skill; trying to keep herself motivated, take care of her son, I lost a lot of my routines I need to get them back.  Patient additionally reported trying to decrease her marijuana use reports she smoking a lot less than she was at the beginning of treatment.  Patient reports to be taking their medication as prescribed currently but did have 1 day she did not take her medication could she did not meet with the nurse and reports no concerns noted at this time. Patient reports currently to not be experiencing any ideation, intent, or plans in reference to suicidal or homicidal symptomatology.  Patient saw APRN please see note.? Plan? Continue to assist patient with skills to improve functionContinue to assist patient with skills to manage symptomsContinue to reinforce use of positive coping skillsContinue to review and assess symptoms / issuesContinue to review and assess treatment goals / skill useContinue per Plan of Care / Interdisciplinary Plan of Care ??C-SSRS Since Last Visit? ? [place] - Suicide Severity Screen? ? Most Recent Value Have you wished you were dead or wished you could go to sleep and not wake up? ?Yes Have you actually had any thoughts of killing yourself?  ?No Have you been thinking about how you might kill yourself?  ?No [place] Suicide Risk Level ?Low Risk ??Cindie Crumbly, LCSW6/2/20215:56 PM

## 2019-08-11 NOTE — Telephone Encounter
Patientas visiting nurse Zoe Scott from [facility] called to request a refill on patient's prescription for Ambien as she only has 4 days left. RN/writer inform APRN Shanda Bumps Dinisco who said that she is seeing pateint today.Zoe Scott, RN6/2/202110:11 AM

## 2019-08-11 NOTE — Other
REACH Adult Multidisciplinary Team Clinical Rounds Meeting NoteMeeting Date: 08/11/2019?STAFF PRESENT: ?Louie Casa, APRN - Psychiatric Nurse Storm Frisk, LCSW - Social Work?Attendance: Patient attending IOP level of care, Co-occurring disorder track, x 3 days weekly. Patient's attendance has been inconsistent and patient has attended 18 days of IOP treatment.  Patient was show 1 day last week continues to challenges in consistently attending; addressed by clinician and stated that if patient continues to have this issue she could be closed for noncompliance/non adherence to treatment recommendations relating to attendance. ?Progress towards goals: Patient has made moderate progress; patient's mood appears to be stabilizing and patient endorses medication management as a large support in helping her better manage herself.  Patient continues to struggle with cannabis continues to test positive at times levels as high as when she started IOP treatment.  Patient did become toxic on her lithium as evidence by a critical lab value which was quickly addressed; patient denied any symptomatology, medication was adjusted and titrated the patient continues to take an increased level of Cogentin,  Vraylar was increased, and lithium will be discontinued as it is currently being decreased.  Patient continues to have a visiting nurse support.  Patient's lithium level has significantly decreased at this time patient reports to feel better.  Patient still feels some anxiety but does feel better now that her college classes have ended and she is finding activities to take up her boredom.  Patient continues to work toward having increased activation however difficulty in identifying appropriate childcare supports.  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  At times patient reports a decreased level of motivation and difficulty getting through her day.  In addition patient continues to struggle with low self-esteem and self-worth.Substance Use: Patient reporting active cannabis use at this time at this time.  Most recent toxicology screen conducted was positive for cannabis. ?New Treatment Goals: N/A?Plan:Continue in IOP program in Co-occurring disorder track 3 days per week to monitor and support mood stability, and support and monitor sobriety. In addition to enhance knowledge and use of coping strategies, and continue to work toward mood stabilization. Continue treatment plan and stabilization including psycho education on diagnosis, medication management with psychiatric LIP. Safety plan reviewed regularly and patient is aware to call 911 or go to nearest ED if symptoms worsen. Behavioral and mental health needs that remain to be met include evaluation of medication trials and ongoing medication management, developing emotional regulation, increasing distress tolerance and setting-up recovery and relapse prevention support. Clinician meeting in an ongoing nature with patient 1:1 to review discharge planning, termination, and progress in treatment.?-Patient to meet with Louie Casa, APRN week of 08/11/19?for medication management follow-up.?- Patient has a tentative discharge date for 08/26/2019; clinician looking to refer patient to private practitioner in the community does not require counseling be maintained/contingent upon at the same time to get medication management.  However this provider does offer counseling within her office setting for patient to receive counseling which will be recommended.Treatment Discussion Representation:Margarita Maryelizabeth Kaufmann, MD?-?Psychiatrist, Medical DirectorKatherine Bajda, APRN -?Psychiatric Nurse PractitionerJess DiNisco, APRN - Psychiatric Nurse PractitionerJennifer Naughton, LCSW - Clinical CoordinatorNatalie Cristino, LCSW - Social Edd Arbour, LCSW - Social WorkKaren [place], [place] - Social Celene Skeen, Charity fundraiser, BSN - Registered NurseBridget [place], [place], [place]- Art Donald Pore, CTRS - Recreation TherapyNick [place], [place] - Psych [employer]??Cindie Crumbly, LCSW6/2/20219:47 AM

## 2019-08-11 NOTE — Progress Notes
VIDEO TELEHEALTH VISIT, PLATFORM OTHER THAN MYCHART: This clinician is conducting this visit at a site within our enrolled clinical location..  For this visit the clinician and patient were present via interactive audio & video telecommunications system that permits real-time communications, via a platform other than MyChart.Platforms other than MyChart may be used only during the COVID-19 crisis, when MyChart is not an option, and video is required for clinical reasons. Reason for choice of video platform other than MyChart: Patient unable to access MyChart Video secondary to currently being in [employer] via [employer].PLEASE NOTE: CONSENT FOR THIS VISIT IS DIFFERENT THAN FOR MYCHART VIDEO VISITS! Verbal consent must be obtained by the clinician or staff prior to starting visit.  Read the Following Statements to Video Visit Participants:  ?(Name of clinician) will be conducting your video visit today. Before we begin, we must obtain your consent for today's visit.Can you confirm your name, date of birth, and state where you (or your child, if applicable) are currently located?By consenting, you understand that today's visit will be conducted through video conferencing technology other than our usual secure service. While we do everything possible to ensure your privacy, we may not be able to guarantee the security of this particular platform. You understand that the visit will not be recorded, and that we will be documenting this visit in our electronic medical record, and that we may disclose records concerning this interaction to other clinicians. You understand that a video visit may limit our ability to diagnose your condition.  You understand that if you are experiencing a medical emergency, you will be directed to dial 9-1-1 as we are not able to connect you directly. You also understand if your condition changes after this visit and you need of further care, you will contact our office. Do you agree to this consent, knowing that you can change your decision at any time? Do you have any other questions before we proceed??Patient consent given for video visit: yesState patient is located in: [place] @ home address The clinician is appropriately licensed in the above state to provide care for this visit.Other individuals present during the telehealth encounter and their role/relation: noneTotal time spent in medical video consultation (required if billing based on time): 12 minutes  Start Time: 10:58  End Time: 11:10[facility] Adult Reach [phone] [address]Bridge[address] [place] [address] Number: [phone] Number: [phone] Outpatient Psychiatry MD/LIP Progress Note6/2/2021Start Time:  10:58      End Time:  11:10Session Type:  Medication ManagementAmanda B Staiger is a 24 y.o., Single, female.SUBJECTIVE Chief Complaint: I am having more suicidal thoughts and more restlessness.  Interim History:  24 year old female with past psychiatric history of bipolar disrder (manic and depressive) and a history of suicide attempts and anxiety disorder referred to [facility] adult IOP by [facility] following an inpatient admission for worsening depression and anxiety symptoms at the beginning of March 2021. However, she finished a 1/2 of a group and then left the program. On Thursday, 06/17/2019, [name] called REACH requesting a refill on her medication.  She reported feeling sick and very nauseated and lethargic. She was given a refill and started IOP on, 06/21/19. Ivelisse's most recent lithium level on 08/02/19: 0.24 at lithium 600mg  in AM. Her current medications include: Lithium 600mg  in the AM Vraylar 4.5mg  nightly Seroquel 50mg  nightly (plan to decrease) Zolpidem 5mg  nightly as needed for sleep Cogentin 2mg  twice a day Cookie reports, the nurses aren't giving  me the right dosages. They come at different time and giving me the wrong dosages and times. It's why my lithium level was high. Antonieta reports feeling depressed all weekend. She reports missing a days worth of medication after the visting nurse came too late and she needed to go to work. She reports experiencing passive suicidal thoughts. She reports having a lack of motivation and enjoyment with activities. She reports racing thoughts one night and usually receiving about 7 hours of sleep. Tyna reports feeling angry and spending a lot of money. Anxiety is higher since the weekend. No panic reported.Plan: Continue IOPStop seroquelIncrease  lithium 1050mg  in AMContinue cogentin 1mg  twice a day Continue vraylar 4.5mg  Continue zolpidem 5mg  nightly as neededContinue hydroxyzine 25mg  every four hours as needed for anxiety ?REVIEW OF ALLERGIES/MEDICATIONS/HISTORY I have reviewed the patient's current medications and allergiesI have reviewed with the patient, the risks and benefits of the medications prescribed.OBJECTIVE Review of SystemsReview of Systems Psychiatric/Behavioral: Positive for dysphoric mood and suicidal ideas. The patient is nervous/anxious.  All other systems reviewed and are negative. LabsNo new labsCurrent MedicationsCurrent Outpatient Medications Medication Sig ? benztropine (COGENTIN) 1 mg tablet Take 1 tablet (1 mg total) by mouth 2 (two) times daily. ? cariprazine 4.5 mg Cap Take 1 capsule (4.5 mg total) by mouth daily. ? lithium carbonate (LITHOBID CR) 300 mg CR extended release tablet Take 2 tablets (600 mg total) by mouth every morning. Re-start on 08/01/19 ? QUEtiapine (SEROQUEL) 100 mg Immediate Release tablet Take 0.5 tablets (50 mg total) by mouth nightly. ? zolpidem (AMBIEN) 5 mg tablet Take 1 tablet (5 mg total) by mouth nightly as needed for sleep. No current facility-administered medications for this encounter.  Mental Status ExamGeneral AppearanceHabitus:  MediumGrooming:  GoodMusculoskeletalStrength and Tone: Strength normalGait and Station: Stable gait and stable posturePsychiatricAttitude: Cooperative, good eye contact and pleasantSpeech: normal rate, volume and prosodyMood: depressed, discouraged   Patient reported mood:  I feel goodAffect: Congruent to reported moodThought Process: Coherent, logical, goal directedAssociations: NormalThought Content: Normal no auditory hallucinations, no command auditory hallucinations, no paranoid delusions, not perseverativeSuicidal Ideation: No current suicidal plan, ideation or intentHomicidal Ideation: No current homicidal ideation, plan or intentJudgment:  GoodInsight:  GoodCognitive EvaluationOrientation: Oriented to person, oriented to place and oriented to date/time Attention and Concentration:  Normal attention and concentrationMemory: Recent and remote memory intactLanguage:  Language intactFund of Knowledge: average.Abstract Reasoning: Normal capacity for abstract reasoningSafety and Risk AssessmentPSY RISK ASSESSMENT SAFE-T WITH C-SSRS C-SSRS: Suicidal Ideation:  Since Last Assessment  WISH TO BE DEAD:  Yes  CURRENT SUICIDAL THOUGHTS:  No  SUICIDAL THOUGHTS WITH METHOD:  No  SUICIDAL INTENT WITHOUT SPECIFIC PLAN:  No  INTENT WITH PLAN:  No  Have you ever done anything, started to do anything or prepared to do anything to end your life?:  No  Was it within the past 3 months?:  NoSuicidal Ideation Intensity :   FREQUENCY:  Many times each day  DURATION:  Less than 1 hour / Some of the time  CONTROLLABILITY:  Easily able to control thoughts  DETERRENTS:  Deterrents definitely stopped you from attempting suicide  REASONS FOR IDEATION:  Does not applyRisk Assessment:   Access to Lethal Methods: NoCurrent and Past Psychiatric Diagnoses:    Mood Disorder:  Recurrent/Current  Psychotic Disorder:  No  Alcohol/Substance Abuse Disorders:  Recurrent/Current  PTSD:  No  ADHD:  No  TBI:  No  Cluster B Personality Disorders or Traits:  No  Conduct Problems:  No  Suicide Attempt:  No Prior Attempts  Presenting Symptoms:  Anhedonia:  Yes  Impulsivity:  No  Hopelessness or Despair:  Yes  Anxiety and/or Panic:  No  Insomnia:  No  Command Hallucinations:  No  Psychosis:  No  Self-injurious Behavior:  No  Physical Aggression Toward Others:  No  Violence, Victimization and/or Perpetration:  No  Family History:   Suicide:  No  Suicidal Behavior:  No  Axis I Psychiatric Diagnosis requiring hospitalization:  No  Precipitants / Stressors:   Triggering events leading to humiliation, shame and/or despair:  No  Chronic physical pain or other acute medical problem:  No  Sexual / Physical Abuse:  No  Substance Intoxication or Withdrawal:  No  Pending Incarceration:  No  Legal Problems:  No  Inadequate Social Supports:  No  Social Isolation:  No  Perceived burden on others:  No  Bullying / Cyberbullying:  No  Media portrayal of suicide:  No  Housing Insecurity:  No  Financial Insecurity:  No  Stressful Life Events:  Yes  Gender / Sexual Identity:  No  Homelessness:  No  Change in Treatment:    Recent inpatient discharge:  No  Change in provider or treatment:  No  Hopeless or dissatisfied with provider or treatment:  No  Non-compliant:  Yes  Not receiving treatment:  NoCurrent and Past Psychiatric Diagnoses:  Mood Disorder:  Recurrent/Current  Psychotic Disorder:  No  Alcohol/Substance Abuse Disorders:  Recurrent/Current  PTSD:  No  ADHD:  No  TBI:  No  Cluster B Personality Disorders or Traits:  No  Conduct Problems:  No  Suicide Attempt:  No Prior AttemptsProtective Factors:   Internal:   Ability to cope with stress:  Yes  Frustration tolerance:  Yes  Fear of death or the actual act of killing self:  Yes  Identifies reasons for living:  Yes  Problem solving skills:  Yes  External:   Responsibility to children:  Yes  Beloved pets:  Yes  Supportive social network of friends or family:  Yes  Positive therapeutic relationships / Effective mental healthcare:  YesRisk to Self - Self-Injurious Behavior:   Current Urges to harm Self:  No  Recent Self-Injury:  No  History of Self Injury:  No  Imminent Risk for Self Injury in Community:  Low  Imminent Risk for Self Injury in Facility:  LowRisk to Others:   Current Agitation:  No  Homicidal/Aggressive Ideation:  No  Homicidal/Aggressive Threat/Plan:  No  Recent Violence/Aggression:  NoWithdrawal Risk:   Alcohol / Benzodiazepines / Barbiturates:  Not applicable     Alcohol/Benzodiazepine/Barbiturate Risk for Withdrawal:  Absent  Opioids:  Not applicable     Opioid Risk for Withdrawal:  AbsentMEDICAL DECISION MAKING DiagnosisProblem List         ICD-10-CM   OP/IOP/PHP Psychiatry  MDD (major depressive disorder), recurrent severe, 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 Aracelys reports more depressed mood, racing thoughts, and passive SI since over the weekend. Lithium level from 08/02/19: 0.24 at lithium 600mg  daily. Will stop seroquel as planned for continued taper.  Will increase lithium to 1050mg  a day. Will continue to monitor mood/anxiety. Currently, no imminent safety concerns. The Self-Report [place] Risk Suicide Severity Rating Scale Since Last Visit was completed by Margarita Grizzle and reviewed by myself today.  Additionally, risk assessment from [facility] intake assessment and follow up screenings have been reviewed.  Based on Consuela B Siverson?s clinical presentation today as well as her self-reported symptom ratings today and over the current course of treatment, I have assessed Margarita Grizzle to be at unchanged risk of harm to self as compared with prior recent assessments.  Based on this assessment, I have continued current treatment plan including implementation of factors to mitigate risks of harm to self as outlined in the last risk assessmentIntervention(s)Supportive therapy Labs/Test/Consults OrderedNone today Medication ChangesStop seroquelIncrease  lithium 1050mg  in AM  PlanStop seroquelIncrease lithium 1050mg  in AMContinue cogentin 1mg  twice a day Continue vraylar 4.5mg  Continue zolpidem 5mg  nightly as neededContinue hydroxyzine 25mg  every four hours as needed for anxiety Discussed side effects, pros & cons of treatment, and alternative treatment options. Reviewed safety plan to call the clinic, the National Suicide Prevention Lifeline: (928) 312-8777, or  911 to go to the ER if safety is at risk. Not deemed at imminent risk of harm to self/others at this timePatient verbalized understanding and had no additional questions. Electronically Signed:Apolinar Bero, NP 08/11/2019 11:10 AM

## 2019-08-11 NOTE — Other
Group Session:  Group Therapy Group Start Time:   12:10 PM      End Time:  1:00 PMFacilitators:  Cindie Crumbly, LCSWVIDEO TELEHEALTH VISIT, PLATFORM OTHER THAN MYCHART: This clinician is conducting this visit at a site within our enrolled clinical location.Marland Kitchen ?For this visit the clinician and patient were present via interactive audio & video telecommunications system that permits real-time communications, via a platform other than MyChart.?Platforms other than MyChart may be used only during the COVID-19 crisis, when MyChart is not an option, and video is required for clinical reasons. ?Reason for choice of video platform other than MyChart: Currently [facility] has identified Zoom as the appropriate telehealth platform to facilitate IOP Group Therapy.?PLEASE NOTE: CONSENT FOR THIS VISIT IS DIFFERENT THAN FOR MYCHART VIDEO VISITS! ?Verbal consent must be obtained by the clinician or staff prior to starting visit.??Read the Following Statements to Video Visit Participants:????(Name of clinician) will be conducting your video visit today. Before we begin, we must obtain your consent for today's visit.Can you confirm your name, date of birth, and state where you (or your child, if applicable) are currently located?By consenting, you understand that today's visit will be conducted through video conferencing technology other than our usual secure service. While we do everything possible to ensure your privacy, we may not be able to guarantee the security of this particular platform. You understand that the visit will not be recorded, and that we will be documenting this visit in our electronic medical record, and that we may disclose records concerning this interaction to other clinicians. You understand that a video visit may limit our ability to diagnose your condition.??You understand that if you are experiencing a medical emergency, you will be directed to dial 9-1-1 as we are not able to connect you directly. You also understand if your condition changes after this visit and you need of further care, you will contact our office. Do you agree to this consent, knowing that you can change your decision at any time? Do you have any other questions before we proceed???Patient consent given for video visit: yes?State patient is located in: [place] At home?LocationThe clinician is appropriately licensed in the above state to provide care for this visit.??Session Focus? Connection between feelings, thoughts and behaviorsDevelop skills to manage issues and improve functioningStatus of symptoms / issuesTreatment goal and skill useDaily goal setting Number of Participants? 7 Treatment Modality? Group Therapy Interventions/Education? Assisted patient addressing stressful life situationsAssisted patient with discharge planningAssisted patient with strategies for addressing peer issuesAssisted patient with strategies for managing illness/symptomsAssisted patient with terminationEncouraged increased cooperation with othersEncouraged increased initiation in groupsEncouraged patient to increase treatment focusEncouraged patient to verbalize, rather than enactEncourages patient to provide constructive feedback to othersAssisted patient with strategies for addressing family issuesAssisted patient with strategies for addressing school issuesEncouraged acceptance, tolerance and respect for othersEncouraged appropriate expression of thoughts and feelingsEncouraged improved listening skillsEncouraged patient to request as-needed help solving problemsHelped decrease isolation/increase sense of belongingHelped identify feelings, thoughts and behaviors connectionHelped increase awareness of feelings, thoughts and behaviorsHelped patient acquire skills to build self-confidenceHelped patient identify strengthsHelped patient increase ability to gain support from othersHelped patient increase comfort with othersHelped patient increase interpersonal effectivenessHelped patient increase sense of acceptance by othersProvided assistance problem-solving challengesProvided positive reinforcement for successesProvided reinforcement for patient contributions to othersProvided reinforcement for use of adaptive coping skills ?Attendance???????????? AttendedEngagement InterventionsNot applicable - patient attended group without difficulty Degree of Participation? Active Behavior Alert, Cooperative, and Interactive Speech/Thought Process Coherent, Directed and Relevant Affect/Mood Anxious, Depressed  and Irritable Insight Moderate Judgment Moderate Response to Interventions? Attentive, Engaged, Interested and Receptive Individualization? SUMMARY NOTE Group 3:During IOP Group Therapy via ZOOM Patients were presented information on how to use distraction for distress tolerance.  Clinician use the ACCEPTS Acronym for; activities, contributing, comparisons, emotions, pushing away, thoughts, and sensations.  Specifically this patient identified the topic being useful in developing distress tolerance.  Patient also identified she accepts her diagnosis agrees ER to be honest with providers.  Patient also shared trying to work through distressing moments is very difficult for her without becoming irritated and/or acting out.  Patient reports currently to not be experiencing any ideation, intent, or plans in reference to suicidal or homicidal symptomatology.? Plan? Continue to assist patient with skills to improve functionContinue to assist patient with skills to manage symptomsContinue to reinforce use of positive coping skillsContinue to review and assess symptoms / issuesContinue to review and assess treatment goals / skill useContinue per Plan of Care / Interdisciplinary Plan of Care ?Cindie Crumbly, LCSW6/2/20215:58 PM

## 2019-08-11 NOTE — Group Note
Group Session:  Group TherapyGroup Start Time:   11:10 AM      End Time:  12:00 PMFacilitators:  Daegan Arizmendi, Shanda Bumps, NP; [place], [place], PCTVIDEO TELEHEALTH VISIT, PLATFORM OTHER THAN MYCHART: This clinician is conducting this visit at a site within our enrolled clinical location..  For this visit the clinician and patient were present via interactive audio & video telecommunications system that permits real-time communications, via a platform other than MyChart.Platforms other than MyChart may be used only during the COVID-19 crisis, when MyChart is not an option, and video is required for clinical reasons. Reason for choice of video platform other than MyChart: Currently [facility] has identified Zoom as the appropriate telehealth platform to facilitate IOP Group Therapy.PLEASE NOTE: CONSENT FOR THIS VISIT IS DIFFERENT THAN FOR MYCHART VIDEO VISITS! Verbal consent must be obtained by the clinician or staff prior to starting visit.  Read the Following Statements to Video Visit Participants:  ?(Name of clinician) will be conducting your video visit today. Before we begin, we must obtain your consent for today's visit.Can you confirm your name, date of birth, and state where you (or your child, if applicable) are currently located?By consenting, you understand that today's visit will be conducted through video conferencing technology other than our usual secure service. While we do everything possible to ensure your privacy, we may not be able to guarantee the security of this particular platform. You understand that the visit will not be recorded, and that we will be documenting this visit in our electronic medical record, and that we may disclose records concerning this interaction to other clinicians. You understand that a video visit may limit our ability to diagnose your condition.  You understand that if you are experiencing a medical emergency, you will be directed to dial 9-1-1 as we are not able to connect you directly. You also understand if your condition changes after this visit and you need of further care, you will contact our office. Do you agree to this consent, knowing that you can change your decision at any time? Do you have any other questions before we proceed??Patient consent given for video visit: yesState patient is located in: [place]The clinician is appropriately licensed in the above state to provide care for this visit.Other individuals present during the telehealth encounter and their role/relation: Identified group facilitator(s), support staff as needed, and fellow group participants. Session Focus Connection between feelings, thoughts and behaviorsDevelop skills to manage issues and improve functioning Number of Participants 7 Treatment Modality Group Therapy Interventions/Education Helped identify feelings, thoughts and behaviors connectionHelped increase awareness of feelings, thoughts and behaviorsProvided reinforcement for use of adaptive coping skills Attendance            AttendedEngagement InterventionsNot applicable - patient attended group without difficulty Degree of Participation Active Behavior Appropriate and Cooperative Speech/Thought Process Coherent and Organized Affect/Mood Intermittently anxious/sad and calm following the meditation.  Insight Good Judgment Good Response to Interventions Attentive and Engaged Individualization Zoe Scott engaged in the didactic portion of creating positive emotions (learning about the negativity bias and the impact of positive & negative thoughts on body and emotions). Zoe Scott participated in the guided, heart meditation and expressed feeling calm following completion of the meditation. She expressed feeling calmer in her body with positive words and thoughts.  Plan Continue to assist patient with skills to improve functionContinue to assist patient with skills to manage symptomsContinue to reinforce use of positive coping skills

## 2019-08-11 NOTE — Telephone Encounter
RN/writer called and spoke to patient's home care nurse Lissette from [facility], to inform her of 2 medication changes.( 1. Discontinue Seroquel, 2. Increase Lithium to 1050 mg.) See Shanda Bumps DiNisco's progress note from today.   RN Ranae Plumber also informed Listette that Shanda Bumps DiNisco said that she will  order the refill for Ambien. RN also discussed with Lissette patient's concern of having multiple nurses and patient questioning that she is getting the right dosage  of meds.RN/writer discussed the concern of her lithium level changing so dramatically recently.`Lissette said that each time a new med is changed she puts in the order and the other nurses are notified. Lissete stated that patient told her that patient has not been told when medications are changed. RN assured  Lissette that the patient was met with individually today to inform her of her medication changes. Larey Brick, RN6/2/202112:59 PM

## 2019-08-12 ENCOUNTER — Inpatient Hospital Stay: Admit: 2019-08-12 | Discharge: 2019-08-12 | Payer: MEDICAID | Primary: Cardiovascular Disease

## 2019-08-12 DIAGNOSIS — F411 Generalized anxiety disorder: Secondary | ICD-10-CM

## 2019-08-12 DIAGNOSIS — F332 Major depressive disorder, recurrent severe without psychotic features: Secondary | ICD-10-CM

## 2019-08-12 NOTE — Other
Group Start Time: ??10:00 AM??????End Time: ?11:00 AMFacilitators:  Cindie Crumbly, LCSWVIDEO TELEHEALTH VISIT, PLATFORM OTHER THAN MYCHART: This clinician is conducting this visit at a site within our enrolled clinical location.Marland Kitchen ?For this visit the clinician and patient were present via interactive audio & video telecommunications system that permits real-time communications, via a platform other than MyChart.?Platforms other than MyChart may be used only during the COVID-19 crisis, when MyChart is not an option, and video is required for clinical reasons. ?Reason for choice of video platform other than MyChart: Currently [facility] has identified Zoom as the appropriate telehealth platform to facilitate IOP Group Therapy.?PLEASE NOTE: CONSENT FOR THIS VISIT IS DIFFERENT THAN FOR MYCHART VIDEO VISITS! ?Verbal consent must be obtained by the clinician or staff prior to starting visit.??Read the Following Statements to Video Visit Participants:????(Name of clinician) will be conducting your video visit today. Before we begin, we must obtain your consent for today's visit.Can you confirm your name, date of birth, and state where you (or your child, if applicable) are currently located?By consenting, you understand that today's visit will be conducted through video conferencing technology other than our usual secure service. While we do everything possible to ensure your privacy, we may not be able to guarantee the security of this particular platform. You understand that the visit will not be recorded, and that we will be documenting this visit in our electronic medical record, and that we may disclose records concerning this interaction to other clinicians. You understand that a video visit may limit our ability to diagnose your condition.??You understand that if you are experiencing a medical emergency, you will be directed to dial 9-1-1 as we are not able to connect you directly. You also understand if your condition changes after this visit and you need of further care, you will contact our office. Do you agree to this consent, knowing that you can change your decision at any time? Do you have any other questions before we proceed???Patient consent given for video visit: yes?State patient is located in: [place] At home?LocationThe clinician is appropriately licensed in the above state to provide care for this visit.??Session Focus? Connection between feelings, thoughts and behaviorsDevelop skills to manage issues and improve functioningStatus of symptoms / issuesTreatment goal and skill useDaily goal setting Number of Participants? 6 Treatment Modality? Group Therapy Interventions/Education? Assisted patient addressing stressful life situationsAssisted patient with discharge planningAssisted patient with strategies for addressing peer issuesAssisted patient with strategies for managing illness/symptomsAssisted patient with terminationEncouraged increased cooperation with othersEncouraged increased initiation in groupsEncouraged patient to increase treatment focusEncouraged patient to verbalize, rather than enactEncourages patient to provide constructive feedback to othersAssisted patient with strategies for addressing family issuesAssisted patient with strategies for addressing school issuesEncouraged acceptance, tolerance and respect for othersEncouraged appropriate expression of thoughts and feelingsEncouraged improved listening skillsEncouraged patient to request as-needed help solving problemsHelped decrease isolation/increase sense of belongingHelped identify feelings, thoughts and behaviors connectionHelped increase awareness of feelings, thoughts and behaviorsHelped patient acquire skills to build self-confidenceHelped patient identify strengthsHelped patient increase ability to gain support from othersHelped patient increase comfort with othersHelped patient increase interpersonal effectivenessHelped patient increase sense of acceptance by othersProvided assistance problem-solving challengesProvided positive reinforcement for successesProvided reinforcement for patient contributions to othersProvided reinforcement for use of adaptive coping skills ?Attendance???????????? AttendedEngagement InterventionsNot applicable - patient attended group without difficulty Degree of Participation? Active Behavior Alert, Cooperative, and Interactive Speech/Thought Process Coherent, Directed and Relevant Affect/Mood Anxious, Depressed and Irritable Insight Moderate Judgment Moderate Response to Interventions? Attentive, Engaged, Interested and Receptive  Individualization? ??SUMMARY NOTE Group 1:During IOP Group Therapy via ZOOM Patient self-reported on a scale of 0 = None to 10= Highest; patient described their most concerning symptomatology as; depression 5, irritability 7, rumination 5, racing thoughts 3, anxiety 7, drug cravings 4 and 0 for all other assessed symptomatology.  Patient report was congruent with the patient's observed presentation.  Patient reports goal for the week as:  Achieved ?I took advice from the group I started with 1 drawer and got easier as a 1 on and I cleaned all my drawers and actually reorganized a lot of my room; I am almost done?.  Patient identifies things that went well/grateful for; my health and my son's health, roof over my head, my mom's relationship is improving at a distance.  Patient identified purposely working on the following skill; going out of the house, taking 1 step at a time, breaking things down into smaller pieces. Patient reports to be taking their medication as prescribed and reports no concerns noted at this time.  Patient additionally reported ?getting really tired of being overwhelmed zone trying to change things, there is a ?lot of drama in my house? patient wished not to disclose further at this time but did report she and her son are safe.  Patient reports currently to not be experiencing any ideation, intent, or plans in reference to suicidal or homicidal symptomatology. Plan? Continue to assist patient with skills to improve functionContinue to assist patient with skills to manage symptomsContinue to reinforce use of positive coping skillsContinue to review and assess symptoms / issuesContinue to review and assess treatment goals / skill useContinue per Plan of Care / Interdisciplinary Plan of Care ??C-SSRS Since Last Visit? ? [place] - Suicide Severity Screen? ? Most Recent Value Have you wished you were dead or wished you could go to sleep and not wake up? ?No Have you actually had any thoughts of killing yourself?  ?No Have you been thinking about how you might kill yourself?  ?No [place] Suicide Risk Level ?Low Risk ??Cindie Crumbly, LCSW6/3/202111:51 AM

## 2019-08-12 NOTE — Group Note
Group Session 2:  Group TherapyGroup Start Time:   11:10 AM      End Time:  12:00 PMFacilitators:  Amberlee Garvey, LCSW; [place], Janyth Pupa, PCTVIDEO TELEHEALTH VISIT, PLATFORM OTHER THAN MYCHART: This clinician is conducting this visit at a site within our enrolled clinical location..  For this visit the clinician and patient were present via interactive audio & video telecommunications system that permits real-time communications, via a platform other than MyChart.Platforms other than MyChart may be used only during the COVID-19 crisis, when MyChart is not an option, and video is required for clinical reasons. Reason for choice of video platform other than MyChart: Currently [facility] has identified Zoom as the appropriate telehealth platform to facilitate IOP Group Therapy.PLEASE NOTE: CONSENT FOR THIS VISIT IS DIFFERENT THAN FOR MYCHART VIDEO VISITS! Verbal consent must be obtained by the clinician or staff prior to starting visit.  Read the Following Statements to Video Visit Participants:  ?(Name of clinician) will be conducting your video visit today. Before we begin, we must obtain your consent for today's visit.Can you confirm your name, date of birth, and state where you (or your child, if applicable) are currently located?By consenting, you understand that today's visit will be conducted through video conferencing technology other than our usual secure service. While we do everything possible to ensure your privacy, we may not be able to guarantee the security of this particular platform. You understand that the visit will not be recorded, and that we will be documenting this visit in our electronic medical record, and that we may disclose records concerning this interaction to other clinicians. You understand that a video visit may limit our ability to diagnose your condition.  You understand that if you are experiencing a medical emergency, you will be directed to dial 9-1-1 as we are not able to connect you directly. You also understand if your condition changes after this visit and you need of further care, you will contact our office. Do you agree to this consent, knowing that you can change your decision at any time? Do you have any other questions before we proceed??Patient consent given for video visit: yesState patient is located in: [place]: at patient's home addressThe clinician is appropriately licensed in the above state to provide care for this visit.Other individuals present during the telehealth encounter and their role/relation: Identified group facilitator(s), support staff as needed, and fellow group participants.Session Focus Connection between feelings, thoughts and behaviorsDevelop skills to manage issues and improve functioning Number of Participants 6 Treatment Modality Group Therapy and Psycho-education Interventions/Education Assisted patient addressing stressful life situationsAssisted patient with strategies for managing illness/symptomsEncouraged increased initiation in groupsEncouraged patient to verbalize, rather than enactEncourages patient to provide constructive feedback to othersHelped decrease isolation/increase sense of belongingHelped identify feelings, thoughts and behaviors connectionHelped increase awareness of feelings, thoughts and behaviorsHelped patient acquire skills to build self-confidenceHelped patient identify strengthsHelped patient increase sense of acceptance by othersProvided assistance problem-solving challengesProvided positive reinforcement for successesProvided reinforcement for patient contributions to othersProvided reinforcement for use of adaptive coping skills Attendance            AttendedEngagement InterventionsNot applicable - patient attended group without difficulty Degree of Participation Active Behavior Alert, Appropriate and Oriented Speech/Thought Process Directed and Organized Affect/Mood Full range and Stable Insight Moderate Judgment Moderate Response to Interventions Attentive, Engaged and Interested Individualization Today?s second group was conducted virtually through a ZOOM.  Writer asked group to share what issue or symptom they most struggle with and would want to have a  magic wand to use to manage.  One peer shared that she is afraid of getting into relationship because of her long history of ?toxic? relationships.  This led to other participants sharing about their relationship status and concerns.  Patient shared that she feels happier in her relationship after they took a break for a few weeks.  Patient shared she doesn't think her boyfriend is capable of cheating on her but she still feels insecure and anxious.  Writer suggested couples therapy and offered psychotherapy to group how therapy for all kinds of relationships can be a way to prevent or manage issues pro-actively.  Patient expressed interest in sharing the idea with her boyfriend.Patient did not express any suicidal or homicidal thoughts, plan or intent during this group. Plan Continue to assist patient with skills to improve functionContinue to assist patient with skills to manage symptomsContinue to reinforce use of positive coping skillsContinue to review and assess symptoms / issuesContinue to review and assess treatment goals / skill useContinue per Plan of Care / Interdisciplinary Plan of Care Zoe Scott, LCSW6/3/20211:18 PM

## 2019-08-15 ENCOUNTER — Encounter: Admit: 2019-08-15 | Payer: PRIVATE HEALTH INSURANCE | Attending: Psychiatry | Primary: Cardiovascular Disease

## 2019-08-16 ENCOUNTER — Inpatient Hospital Stay: Admit: 2019-08-16 | Discharge: 2019-08-16 | Payer: MEDICAID | Primary: Cardiovascular Disease

## 2019-08-16 DIAGNOSIS — F332 Major depressive disorder, recurrent severe without psychotic features: Secondary | ICD-10-CM

## 2019-08-16 DIAGNOSIS — F411 Generalized anxiety disorder: Secondary | ICD-10-CM

## 2019-08-16 NOTE — Group Note
Group Session 2:  Group TherapyGroup Start Time:   11:10 AM      End Time:  12:00 PMFacilitators:  Raquell Richer, LCSW; [place], Janyth Pupa, PCTVIDEO TELEHEALTH VISIT, PLATFORM OTHER THAN MYCHART: This clinician is conducting this visit at a site within our enrolled clinical location..  For this visit the clinician and patient were present via interactive audio & video telecommunications system that permits real-time communications, via a platform other than MyChart.Platforms other than MyChart may be used only during the COVID-19 crisis, when MyChart is not an option, and video is required for clinical reasons. Reason for choice of video platform other than MyChart: Currently [facility] has identified Zoom as the appropriate telehealth platform to facilitate IOP Group Therapy.PLEASE NOTE: CONSENT FOR THIS VISIT IS DIFFERENT THAN FOR MYCHART VIDEO VISITS! Verbal consent must be obtained by the clinician or staff prior to starting visit.  Read the Following Statements to Video Visit Participants:  ?(Name of clinician) will be conducting your video visit today. Before we begin, we must obtain your consent for today's visit.Can you confirm your name, date of birth, and state where you (or your child, if applicable) are currently located?By consenting, you understand that today's visit will be conducted through video conferencing technology other than our usual secure service. While we do everything possible to ensure your privacy, we may not be able to guarantee the security of this particular platform. You understand that the visit will not be recorded, and that we will be documenting this visit in our electronic medical record, and that we may disclose records concerning this interaction to other clinicians. You understand that a video visit may limit our ability to diagnose your condition.  You understand that if you are experiencing a medical emergency, you will be directed to dial 9-1-1 as we are not able to connect you directly. You also understand if your condition changes after this visit and you need of further care, you will contact our office. Do you agree to this consent, knowing that you can change your decision at any time? Do you have any other questions before we proceed??Patient consent given for video visit: yesState patient is located in: [place]: at patient's home address The clinician is appropriately licensed in the above state to provide care for this visit.Other individuals present during the telehealth encounter and their role/relation: Identified group facilitator(s), support staff as needed, and fellow group participants.Session Focus Connection between feelings, thoughts and behaviorsDevelop skills to manage issues and improve functioning Number of Participants 8 Treatment Modality Group Therapy Interventions/Education Assisted patient addressing stressful life situationsAssisted patient with strategies for addressing peer issuesAssisted patient with strategies for managing illness/symptomsEncouraged increased cooperation with othersEncouraged increased initiation in groupsEncouraged improved listening skillsEncouraged patient to request as-needed help solving problemsHelped decrease isolation/increase sense of belongingHelped identify feelings, thoughts and behaviors connectionHelped increase awareness of feelings, thoughts and behaviorsHelped patient acquire skills to build self-confidenceHelped patient identify strengthsHelped patient increase ability to gain support from othersHelped patient increase comfort with othersHelped patient increase interpersonal effectivenessHelped patient increase sense of acceptance by othersProvided assistance problem-solving challengesProvided positive reinforcement for successesProvided reinforcement for patient contributions to othersProvided reinforcement for use of adaptive coping skills Attendance            Partial attendance as patient met with APRN.Patient attended group for 32 minEngagement InterventionsNot applicable - patient attended group without difficulty Degree of Participation Moderate Behavior Alert and Calm Speech/Thought Process Coherent, Distracted and Relevant Affect/Mood Euthymic and Stable Insight Moderate Judgment Moderate Response to Interventions  Attentive and Receptive Individualization Today?s second group was conducted virtually through a [employer].  Patients were asked to follow-up on the previous group which included a guided mediation.  The meditation focused in part on forgiveness of ourselves and others.  Patients were asked to consider from whom would they ask forgiveness, who would they consider forgiving and for what might they benefit from by forgiving themselves.  Patient responded to a new peer in group who expressed feeling guilt and shame.  Patient shared that she has felt similarly and offered to peer, it gets better and said that she is learning to be with her family in a way that feels better than it used to feel.Patient did not express any suicidal or homicidal thoughts, plan or intent during this group. Plan Continue to assist patient with skills to improve functionContinue to assist patient with skills to manage symptomsContinue to reinforce use of positive coping skillsContinue to review and assess symptoms / issuesContinue to review and assess treatment goals / skill useContinue per Plan of Care / Interdisciplinary Plan of Care Dallin Mccorkel, LCSW5/17/202111:12 AM

## 2019-08-16 NOTE — Other
Group Session:  Group Therapy Group Start Time:   11:10 AM      End Time:  12:00 PMFacilitators:  Cindie Crumbly, LCSWVIDEO TELEHEALTH VISIT, PLATFORM OTHER THAN MYCHART: This clinician is conducting this visit at a site within our enrolled clinical location.Marland Kitchen ?For this visit the clinician and patient were present via interactive audio & video telecommunications system that permits real-time communications, via a platform other than MyChart.?Platforms other than MyChart may be used only during the COVID-19 crisis, when MyChart is not an option, and video is required for clinical reasons. ?Reason for choice of video platform other than MyChart: Currently [facility] has identified Zoom as the appropriate telehealth platform to facilitate IOP Group Therapy.?PLEASE NOTE: CONSENT FOR THIS VISIT IS DIFFERENT THAN FOR MYCHART VIDEO VISITS! ?Verbal consent must be obtained by the clinician or staff prior to starting visit.??Read the Following Statements to Video Visit Participants:????(Name of clinician) will be conducting your video visit today. Before we begin, we must obtain your consent for today's visit.Can you confirm your name, date of birth, and state where you (or your child, if applicable) are currently located?By consenting, you understand that today's visit will be conducted through video conferencing technology other than our usual secure service. While we do everything possible to ensure your privacy, we may not be able to guarantee the security of this particular platform. You understand that the visit will not be recorded, and that we will be documenting this visit in our electronic medical record, and that we may disclose records concerning this interaction to other clinicians. You understand that a video visit may limit our ability to diagnose your condition.??You understand that if you are experiencing a medical emergency, you will be directed to dial 9-1-1 as we are not able to connect you directly. You also understand if your condition changes after this visit and you need of further care, you will contact our office. Do you agree to this consent, knowing that you can change your decision at any time? Do you have any other questions before we proceed???Patient consent given for video visit: yes?State patient is located in: [place] At home?LocationThe clinician is appropriately licensed in the above state to provide care for this visit.??Session Focus? Connection between feelings, thoughts and behaviorsDevelop skills to manage issues and improve functioningStatus of symptoms / issuesTreatment goal and skill useDaily goal setting Number of Participants? 7 Treatment Modality? Group Therapy Interventions/Education? Assisted patient addressing stressful life situationsAssisted patient with discharge planningAssisted patient with strategies for addressing peer issuesAssisted patient with strategies for managing illness/symptomsAssisted patient with terminationEncouraged increased cooperation with othersEncouraged increased initiation in groupsEncouraged patient to increase treatment focusEncouraged patient to verbalize, rather than enactEncourages patient to provide constructive feedback to othersAssisted patient with strategies for addressing family issuesAssisted patient with strategies for addressing school issuesEncouraged acceptance, tolerance and respect for othersEncouraged appropriate expression of thoughts and feelingsEncouraged improved listening skillsEncouraged patient to request as-needed help solving problemsHelped decrease isolation/increase sense of belongingHelped identify feelings, thoughts and behaviors connectionHelped increase awareness of feelings, thoughts and behaviorsHelped patient acquire skills to build self-confidenceHelped patient identify strengthsHelped patient increase ability to gain support from othersHelped patient increase comfort with othersHelped patient increase interpersonal effectivenessHelped patient increase sense of acceptance by othersProvided assistance problem-solving challengesProvided positive reinforcement for successesProvided reinforcement for patient contributions to othersProvided reinforcement for use of adaptive coping skills ?Attendance???????????? AttendedEngagement InterventionsNot applicable - patient attended group without difficulty Degree of Participation? Active Behavior Alert, Cooperative, and Interactive Speech/Thought Process Coherent, Directed and Relevant Affect/Mood Anxious, Depressed  and Irritable Insight Moderate Judgment Moderate Response to Interventions? Attentive, Engaged, Interested and Receptive Individualization? ?SUMMARY NOTE Group 2:During IOP Group Therapy via ZOOM Patients information on ?challenging anxious thoughts?.  This CBT worksheet discuss the nature of irrational thoughts and encouraged patient to describe common situations that trigger their anxiety, encouraged them when faced with the anxiety producing situation to think about the worst, best, and likely outcomes.  In addition prompted patient's to imagine if the worst outcome comes true, would still matter a week, 1 month, 1 year from now.  Specifically this patient reported ?new people and new situations? are triggering for her.  Patient was receptive to the conversation as well as feedback and seem to have insight into her actions thinking and behavior. Patient reports currently to not be experiencing any ideation, intent, or plans in reference to suicidal or homicidal symptomatology. Plan? Continue to assist patient with skills to improve functionContinue to assist patient with skills to manage symptomsContinue to reinforce use of positive coping skillsContinue to review and assess symptoms / issuesContinue to review and assess treatment goals / skill useContinue per Plan of Care / Interdisciplinary Plan of Care Cindie Crumbly, LCSW6/7/20214:51 PM

## 2019-08-16 NOTE — Telephone Encounter
Clinician called DCF worker and reported that he had faxed over the letter she had tested in reference to patient's care.  DCF for early reported she had seen patient in-person several times in the past several weeks after our conversation and that ?patient look better than I ever saw her?.  In addition she reported the patient had told her this writer had encouraged her to file for disability through DSS.  This Clinical research associate reported he had not had a conversation with patient around that that patients can apply if they wish but that at this time this Clinical research associate does not see her mental health disorder as a permanent disability however it is patient's right to apply if she wishes to and we fill out paperwork that the state sends [place] and they make this determination.  No follow-up expected at this time.  The following is a copy of the letter  sent to [facility] hard copy of which will be scanned into the patient's EMR shortly.Cindie Crumbly, LCSW6/7/20215:19 PMJune 07, 2021[address] [place], [place] [address]UE: Zoe Scott (DOB [birth year])To [name] [facility] Worker,I am writing to you in regards to Zoe Scott (DOB 1995/11/18) who completed an intake assessment on 05/24/19 and was admitted to [facility], an Intensive Outpatient Program (IOP) through [facility] on 06/21/19.  As per our phone conversation on 08/04/2019 and your request for written documentation the following letter is being provided in reference to patient's care:At the start of treatment, patient was observed as guarded, passive, withdrawn, with irritable and labile mood. Patient is now demonstrating improved engagement and actively participating in the therapeutic groups. Patient appears interested and motivated.  Patient's mood appears to be more stable. She is better able to problem solve and is less reactive in the moment.  Patient is observed frequently processing her thoughts and feelings and addressing her concerns in the group environment, and is receptive to staff and peer feedback.Patient has had inconsistent attendance due to difficulty balancing caring for her 58-year-old son and engaging in IOP simultaneously. This has resulted, at times in patient either not attending treatment, leaving early, turning her camera off, frequently trying to multi task with her son who is quite active.  Patient appears to be managing her child appropriately during these limited observations/ interactions.  For patient to be successful at managing her mental health she needs to maintain medication management beyond IOP treatment.  Clinician and patient are co constructed in a discharge plan to include outpatient med management regular weekly individual therapy at a provider in the community.  The patient is in agreement with this writer's recommendation that we will connect her with a individual/stand alone community provider who will have flexibility in their schedule for this patient, rather than a [facility]. This Clinical research associate will continue to recommend weekly individual counseling however as I shared with you and the patient, medication management will be a priority in maintaining progress made in IOP with relation to treatment at decreasing her symptomatology. As a reviewed, patient needs needs some assistance in either signing her child up for [employer] or some other type of supportive child care service so that she can prioritize her mental health and all of her mental health follow-up appointments.  Patient reports difficulty affording babysitter services and reports she is not willing to ?just leave my child with anyone? which in this writer's opinion is good judgment.  As you shared, patient needs employment to apply for [employer] and this writer has strongly  recommended that when she obtains some type of employment and show income, she should apply for this service as her child would also benefit from being around other children in a learning atmosphere.  Any other support the Department of Children and Families could provide or referrals in this domain is imperative for this patient and her well-being.In addition, as I shared with you patient has had positive toxicology screens for cannabis throughout her course of treatment but has reported to have significantly decreased the amount of cannabis she uses.  Throughout her IOP treatment, this program has recommended patient continues to work towards decreasing cannabis use with abstinence as the goal.  Clinician has discussed with patient effects of cannabis on mental health, judgment parenting and overall general health.  Patient continues to struggle with cannabis use.  This may be an ongoing focus in individual therapy to work towards personal goals of sobriety.  Patient has had several medication changes throughout her course of treatment and as I shared with you recently, became toxic on her lithium level which was then decreased.  Patient's medication titration continues to be adjusted. Based my discussions with the Ervin Knack, APRN who is the current IOP provider, patient she has a tentative discharge date for 08/26/2019.  Patient's diagnosis is as follows:-Bipolar disorder, current episode depressed, moderate F31.32 -Cannabis use disorder, moderate, dependence F12.20 -PTSD (posttraumatic stress disorder) F43.10-Pregnancy Z34.90If you have any further questions I can be reached directly at [phone]; Fax [phone].Thank Lew Dawes, LCSWClinicianREACH Adult Program

## 2019-08-16 NOTE — Other
Group Start Time: ??10:00 AM??????End Time: ?11:00 AMFacilitators:??Cindie Crumbly, LCSWVIDEO TELEHEALTH VISIT, PLATFORM OTHER THAN MYCHART: This clinician is conducting this visit at a site within our enrolled clinical location.Marland Kitchen ?For this visit the clinician and patient were present via interactive audio & video telecommunications system that permits real-time communications, via a platform other than MyChart.?Platforms other than MyChart may be used only during the COVID-19 crisis, when MyChart is not an option, and video is required for clinical reasons. ?Reason for choice of video platform other than MyChart: Currently [facility] has identified Zoom as the appropriate telehealth platform to facilitate IOP Group Therapy.?PLEASE NOTE: CONSENT FOR THIS VISIT IS DIFFERENT THAN FOR MYCHART VIDEO VISITS! ?Verbal consent must be obtained by the clinician or staff prior to starting visit.??Read the Following Statements to Video Visit Participants:????(Name of clinician) will be conducting your video visit today. Before we begin, we must obtain your consent for today's visit.Can you confirm your name, date of birth, and state where you (or your child, if applicable) are currently located?By consenting, you understand that today's visit will be conducted through video conferencing technology other than our usual secure service. While we do everything possible to ensure your privacy, we may not be able to guarantee the security of this particular platform. You understand that the visit will not be recorded, and that we will be documenting this visit in our electronic medical record, and that we may disclose records concerning this interaction to other clinicians. You understand that a video visit may limit our ability to diagnose your condition.??You understand that if you are experiencing a medical emergency, you will be directed to dial 9-1-1 as we are not able to connect you directly. You also understand if your condition changes after this visit and you need of further care, you will contact our office. Do you agree to this consent, knowing that you can change your decision at any time? Do you have any other questions before we proceed???Patient consent given for video visit: yes?State patient is located in: [place] At home?LocationThe clinician is appropriately licensed in the above state to provide care for this visit.??Session Focus? Connection between feelings, thoughts and behaviorsDevelop skills to manage issues and improve functioningStatus of symptoms / issuesTreatment goal and skill useDaily goal setting Number of Participants? 7 Treatment Modality? Group Therapy Interventions/Education? Assisted patient addressing stressful life situationsAssisted patient with discharge planningAssisted patient with strategies for addressing peer issuesAssisted patient with strategies for managing illness/symptomsAssisted patient with terminationEncouraged increased cooperation with othersEncouraged increased initiation in groupsEncouraged patient to increase treatment focusEncouraged patient to verbalize, rather than enactEncourages patient to provide constructive feedback to othersAssisted patient with strategies for addressing family issuesAssisted patient with strategies for addressing school issuesEncouraged acceptance, tolerance and respect for othersEncouraged appropriate expression of thoughts and feelingsEncouraged improved listening skillsEncouraged patient to request as-needed help solving problemsHelped decrease isolation/increase sense of belongingHelped identify feelings, thoughts and behaviors connectionHelped increase awareness of feelings, thoughts and behaviorsHelped patient acquire skills to build self-confidenceHelped patient identify strengthsHelped patient increase ability to gain support from othersHelped patient increase comfort with othersHelped patient increase interpersonal effectivenessHelped patient increase sense of acceptance by othersProvided assistance problem-solving challengesProvided positive reinforcement for successesProvided reinforcement for patient contributions to othersProvided reinforcement for use of adaptive coping skills ?Attendance???????????? AttendedEngagement InterventionsNot applicable - patient attended group without difficulty Degree of Participation? Active Behavior Alert, Cooperative, and Interactive Speech/Thought Process Coherent, Directed and Relevant Affect/Mood Anxious, Depressed and Irritable Insight Moderate Judgment Moderate Response to Interventions? Attentive, Engaged, Interested and Receptive Individualization? ??SUMMARY  NOTE Group 1:During IOP Group Therapy via ZOOM Patient self-reported on a scale of 0 = None to 10= Highest; patient described their most concerning symptomatology as; depression 6, irritability 3, rumination 7, racing thoughts 4, anxiety 8, sleep problems 4, drug cravings 4 and 0 for all other assessed symptomatology.  Patient report was congruent with the patient's observed presentation.  Patient reports goal for the week as:  Completely stop smoking cannabis, smile more.  Patient reports ?I had a lot of anxiety this week?.  Patient reported that she was going to go to a bridal shower this week had a lot of anxiety for at smoked cannabis and could not attend.  Patient reports she has ?separation anxiety.  Patient also reported that the nurse never came on Saturday and that she could not take her medication as prescribed.  Patient reports the visiting nurses more of an ?inconvenience?.  Patient identifies things that went well/grateful for; I did not smoke weed yesterday.  Patient identified purposely working on the following skill; not smoking, go outside more.  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  Patient identified she is going to take this 1 paycheck and attempt to get care for kids and apply for unemployment.  ?Getting hard to keep up with my son he is like 100 all the time? meaning he is very active which is evident by him being active on screen.  Patient reports to be taking their medication as prescribed and reports no concerns noted at this time.  Patient did report ?the nurse did not come on Saturday?; clinician to follow up with APRN.  Patient reports currently to not be experiencing any ideation, intent, or plans in reference to suicidal or homicidal symptomatology. Plan? Continue to assist patient with skills to improve functionContinue to assist patient with skills to manage symptomsContinue to reinforce use of positive coping skillsContinue to review and assess symptoms / issuesContinue to review and assess treatment goals / skill useContinue per Plan of Care / Interdisciplinary Plan of Care ??C-SSRS Since Last Visit? ? [place] - Suicide Severity Screen? ? Most Recent Value Have you wished you were dead or wished you could go to sleep and not wake up? ?No Have you actually had any thoughts of killing yourself?  ?No Have you been thinking about how you might kill yourself?  ?No [place] Suicide Risk Level ?Low Risk ??Cindie Crumbly, LCSW6/7/20214:50 PM

## 2019-08-16 NOTE — Group Note
Group Session:  Group TherapyGroup Start Time:   12:10 PM      End Time:   1:00 PMFacilitators:  Zoe Scott, Zoe Grebe, Zoe Scott VIDEO TELEHEALTH VISIT, PLATFORM OTHER THAN MYCHART: This clinician is conducting this visit at a site within our enrolled clinical location..  For this visit the clinician and patient were present via interactive audio & video telecommunications system that permits real-time communications, via a platform other than MyChart.Platforms other than MyChart may be used only during the COVID-19 crisis, when MyChart is not an option, and video is required for clinical reasons. Reason for choice of video platform other than MyChart: Currently [facility] has identified Zoom as the appropriate telehealth platform to facilitate IOP Group Therapy.PLEASE NOTE: CONSENT FOR THIS VISIT IS DIFFERENT THAN FOR MYCHART VIDEO VISITS! Verbal consent must be obtained by the clinician or staff prior to starting visit.  Read the Following Statements to Video Visit Participants:  ?(Name of clinician) will be conducting your video visit today. Before we begin, we must obtain your consent for today's visit.Can you confirm your name, date of birth, and state where you (or your child, if applicable) are currently located?By consenting, you understand that today's visit will be conducted through video conferencing technology other than our usual secure service. While we do everything possible to ensure your privacy, we may not be able to guarantee the security of this particular platform. You understand that the visit will not be recorded, and that we will be documenting this visit in our electronic medical record, and that we may disclose records concerning this interaction to other clinicians. You understand that a video visit may limit our ability to diagnose your condition.  You understand that if you are experiencing a medical emergency, you will be directed to dial 9-1-1 as we are not able to connect you directly. You also understand if your condition changes after this visit and you need of further care, you will contact our office. Do you agree to this consent, knowing that you can change your decision at any time? Do you have any other questions before we proceed??Patient consent given for video visit: yes State patient is located in: [place] [facility] clinician is appropriately licensed in the above state to provide care for this visit.Other individuals present during the telehealth encounter and their role/relation: Identified group facilitator(s), support staff as needed, and fellow group participants.Session Focus Connection between feelings, thoughts and behaviorsDevelop skills to manage issues and improve functioningStatus of symptoms / issuesTreatment goal and skill use Number of Participants 7 Treatment Modality Group Therapy Interventions/Education Assisted patient addressing stressful life situationsAssisted patient with strategies for managing illness/symptomsEncouraged increased cooperation with othersEncouraged increased initiation in groupsEncouraged patient to increase treatment focusEncouraged patient to verbalize, rather than enactAssisted patient with strategies for addressing family issuesAssisted patient with strategies for addressing school issuesEncouraged acceptance, tolerance and respect for othersEncouraged appropriate expression of thoughts and feelingsEncouraged improved listening skillsEncouraged patient to request as-needed help solving problemsHelped decrease isolation/increase sense of belongingHelped identify feelings, thoughts and behaviors connectionHelped increase awareness of feelings, thoughts and behaviorsHelped patient acquire skills to build self-confidenceHelped patient identify strengthsHelped patient increase ability to gain support from othersHelped patient increase comfort with othersHelped patient increase interpersonal effectivenessHelped patient increase sense of acceptance by othersProvided assistance problem-solving challengesProvided positive reinforcement for successesProvided reinforcement for patient contributions to othersProvided reinforcement for use of adaptive coping skills Attendance            AttendedEngagement InterventionsNot applicable - patient attended group without difficulty Degree of  Participation Active Behavior Alert, Cooperative and Interactive Speech/Thought Process Coherent, Directed and Relevant Affect/Mood Euthymic and Full range Insight Moderate Judgment Moderate Response to Interventions Attentive, Engaged, Interested and Receptive Individualization Group # 3This group provided education on communication and social skills. Written materials provided education on Communication (why we communicate, needs we communicate, elements of communication, and styles of communication). Styles of Communication were discussed in detail.  Using assertive communication and ?I? Statements to communicate needs were discussed. Patient was engaged in group discussion. She offered a lot of feedback on body language in communication. Patient identified herself to have an aggressive communication style. Plan Continue to assist patient with skills to improve functionContinue to assist patient with skills to manage symptomsContinue to reinforce use of positive coping skillsContinue to review and assess symptoms / issuesContinue to review and assess treatment goals / skill useContinue per Plan of Care / Interdisciplinary Plan of Care Zoe Scott Zoe Scott, LCSW6/7/20214:44 PM

## 2019-08-17 NOTE — Telephone Encounter
I think this request should have gone to you.

## 2019-08-18 ENCOUNTER — Inpatient Hospital Stay: Admit: 2019-08-18 | Discharge: 2019-08-18 | Payer: MEDICAID | Primary: Cardiovascular Disease

## 2019-08-18 ENCOUNTER — Encounter: Admit: 2019-08-18 | Payer: PRIVATE HEALTH INSURANCE | Primary: Cardiovascular Disease

## 2019-08-18 DIAGNOSIS — F332 Major depressive disorder, recurrent severe without psychotic features: Secondary | ICD-10-CM

## 2019-08-18 DIAGNOSIS — F411 Generalized anxiety disorder: Secondary | ICD-10-CM

## 2019-08-18 MED ORDER — BENZTROPINE 2 MG TABLET
2 mg | ORAL_TABLET | Freq: Two times a day (BID) | ORAL | 1 refills | Status: AC
Start: 2019-08-18 — End: 2019-09-08

## 2019-08-18 NOTE — Other
Group Session:  Group Therapy Group Start Time:   11:10 AM      End Time:  12:00 PMFacilitators:  Cindie Crumbly, LCSW; [place], [place], PCTVIDEO TELEHEALTH VISIT, PLATFORM OTHER THAN MYCHART: This clinician is conducting this visit at a site within our enrolled clinical location.Marland Kitchen ?For this visit the clinician and patient were present via interactive audio & video telecommunications system that permits real-time communications, via a platform other than MyChart.?Platforms other than MyChart may be used only during the COVID-19 crisis, when MyChart is not an option, and video is required for clinical reasons. ?Reason for choice of video platform other than MyChart: Currently [facility] has identified Zoom as the appropriate telehealth platform to facilitate IOP Group Therapy.?PLEASE NOTE: CONSENT FOR THIS VISIT IS DIFFERENT THAN FOR MYCHART VIDEO VISITS! ?Verbal consent must be obtained by the clinician or staff prior to starting visit.??Read the Following Statements to Video Visit Participants:????(Name of clinician) will be conducting your video visit today. Before we begin, we must obtain your consent for today's visit.Can you confirm your name, date of birth, and state where you (or your child, if applicable) are currently located?By consenting, you understand that today's visit will be conducted through video conferencing technology other than our usual secure service. While we do everything possible to ensure your privacy, we may not be able to guarantee the security of this particular platform. You understand that the visit will not be recorded, and that we will be documenting this visit in our electronic medical record, and that we may disclose records concerning this interaction to other clinicians. You understand that a video visit may limit our ability to diagnose your condition.??You understand that if you are experiencing a medical emergency, you will be directed to dial 9-1-1 as we are not able to connect you directly. You also understand if your condition changes after this visit and you need of further care, you will contact our office. Do you agree to this consent, knowing that you can change your decision at any time? Do you have any other questions before we proceed???Patient consent given for video visit: yes?State patient is located in: [place] At home?LocationThe clinician is appropriately licensed in the above state to provide care for this visit.??Session Focus? Connection between feelings, thoughts and behaviorsDevelop skills to manage issues and improve functioningStatus of symptoms / issuesTreatment goal and skill useDaily goal setting Number of Participants? 6 Treatment Modality? Group Therapy Interventions/Education? Assisted patient addressing stressful life situationsAssisted patient with discharge planningAssisted patient with strategies for addressing peer issuesAssisted patient with strategies for managing illness/symptomsAssisted patient with terminationEncouraged increased cooperation with othersEncouraged increased initiation in groupsEncouraged patient to increase treatment focusEncouraged patient to verbalize, rather than enactEncourages patient to provide constructive feedback to othersAssisted patient with strategies for addressing family issuesAssisted patient with strategies for addressing school issuesEncouraged acceptance, tolerance and respect for othersEncouraged appropriate expression of thoughts and feelingsEncouraged improved listening skillsEncouraged patient to request as-needed help solving problemsHelped decrease isolation/increase sense of belongingHelped identify feelings, thoughts and behaviors connectionHelped increase awareness of feelings, thoughts and behaviorsHelped patient acquire skills to build self-confidenceHelped patient identify strengthsHelped patient increase ability to gain support from othersHelped patient increase comfort with othersHelped patient increase interpersonal effectivenessHelped patient increase sense of acceptance by othersProvided assistance problem-solving challengesProvided positive reinforcement for successesProvided reinforcement for patient contributions to othersProvided reinforcement for use of adaptive coping skills ?Attendance???????????? Partial attendance due to patient meeting with APRN.  Total minutes attended: 45Engagement InterventionsNot applicable - patient attended group without difficulty Degree of Participation?  Active Behavior Alert, Cooperative, and Interactive Speech/Thought Process Coherent, Directed and Relevant Affect/Mood Anxious, Depressed and Irritable Insight Moderate Judgment Moderate Response to Interventions? Attentive, Engaged, Interested and Receptive Individualization? ?SUMMARY NOTE Group 2:During IOP Group Therapy via ZOOM Patients were presented ?mood interview? in that encouraging patient through an experiential group to understand they have powers in changing or challenging their mood.  Patient identified who in their family affects their mood, things that make them feel down, what overwhelms them, and how hugging or crying can affect their mood as well as other examples given in the interview.  Specifically this patient identified that her mother is toxic and shared some of the things that her mother says to her which were quite negative.  Patient identified low self-esteem and self-worth around this as well as that is very challenging for her to be alone and finds her boyfriend quite supportive.  Patient with self described herself in 1 word as ?weird?.  It was presented the negative connotation about this and patient was presented several reframes by peers.  Patient reports currently to not be experiencing any ideation, intent, or plans in reference to suicidal or homicidal symptomatology.? Plan? Continue to assist patient with skills to improve functionContinue to assist patient with skills to manage symptomsContinue to reinforce use of positive coping skillsContinue to review and assess symptoms / issuesContinue to review and assess treatment goals / skill useContinue per Plan of Care / Interdisciplinary Plan of Care ?Cindie Crumbly, LCSW6/9/20215:29 PM

## 2019-08-18 NOTE — Progress Notes
VIDEO TELEHEALTH VISIT, PLATFORM OTHER THAN MYCHART: This clinician is conducting this visit at a site within our enrolled clinical location..  For this visit the clinician and patient were present via interactive audio & video telecommunications system that permits real-time communications, via a platform other than MyChart.Platforms other than MyChart may be used only during the COVID-19 crisis, when MyChart is not an option, and video is required for clinical reasons. Reason for choice of video platform other than MyChart: Patient unable to access MyChart Video secondary to currently being in [employer] via [employer].PLEASE NOTE: CONSENT FOR THIS VISIT IS DIFFERENT THAN FOR MYCHART VIDEO VISITS! Verbal consent must be obtained by the clinician or staff prior to starting visit.  Read the Following Statements to Video Visit Participants:  ?(Name of clinician) will be conducting your video visit today. Before we begin, we must obtain your consent for today's visit.Can you confirm your name, date of birth, and state where you (or your child, if applicable) are currently located?By consenting, you understand that today's visit will be conducted through video conferencing technology other than our usual secure service. While we do everything possible to ensure your privacy, we may not be able to guarantee the security of this particular platform. You understand that the visit will not be recorded, and that we will be documenting this visit in our electronic medical record, and that we may disclose records concerning this interaction to other clinicians. You understand that a video visit may limit our ability to diagnose your condition.  You understand that if you are experiencing a medical emergency, you will be directed to dial 9-1-1 as we are not able to connect you directly. You also understand if your condition changes after this visit and you need of further care, you will contact our office. Do you agree to this consent, knowing that you can change your decision at any time? Do you have any other questions before we proceed??Patient consent given for video visit: yesState patient is located in: [place] @ home address The clinician is appropriately licensed in the above state to provide care for this visit.Other individuals present during the telehealth encounter and their role/relation: noneTotal time spent in medical video consultation (required if billing based on time): 27 minutes  Start Time: 11:55  End Time: 12:22[facility] [phone] [address] AvenueBridge[address] [place] [address]XBMWU Number: [phone] Number: [phone] Outpatient Psychiatry MD/LIP Progress Note6/9/2021Start Time:  11:55      End Time:  12:22Session Type:  Medication ManagementAmanda B Defino is a 24 y.o., Single, female.SUBJECTIVE Chief Complaint: I still feel really restless. Interim History:  24 year old female with past psychiatric history of bipolar disrder (manic and depressive) and a history of suicide attempts and anxiety disorder referred to [facility] adult IOP by [facility] following an inpatient admission for worsening depression and anxiety symptoms at the beginning of March 2021. However, she finished a 1/2 of a group and then left the program. On Thursday, 06/17/2019, [name] called [facility] requesting a refill on her medication.  She reported feeling sick and very nauseated and lethargic. She was given a refill and started IOP on, 06/21/19. She was last seen and stopped stopped seroquel and increased lithium by 450mg . Her current medications include: Lithium 1050mg  in the AM Vraylar 4.5mg  nightly Zolpidem 5mg  nightly as needed for sleep Cogentin 1mg  twice a day Kalan's most recent lithium level on 08/02/19: 0.24 at lithium 600mg  in AM. Plan to repeat lithium  level tomorrow morning. Marenda reports feeling depressed but not as much since increasing lithium. She explains not feeling as down and not feeling like I don't want to do anything. She reports slightly more energy and slightly more interest in activities. Keydi reports no suicidal thoughts since increasing lithium. She describes continued anxiety with worrying and explains feelings of restlessness and has to pace around, can't stay still, and it impacts everything she does. Restlessness slightly improved with cogentin. She reports no panic attacks.  Zoha reports sleeping about 7 hours per night. Darren reports continued use of cannabis. Encouraged stopping use and she verbalized understanding. Plan: Increase cogentin 2mg  twice a dayContinue lithium 1050mg  in AMContinue vraylar 4.5mg  Continue zolpidem 5mg  nightly as neededContinue hydroxyzine 25mg  every four hours as needed for anxiety ?REVIEW OF ALLERGIES/MEDICATIONS/HISTORY I have reviewed the patient's current medications and allergiesI have reviewed with the patient, the risks and benefits of the medications prescribed.OBJECTIVE Review of SystemsReview of Systems Psychiatric/Behavioral: Positive for suicidal ideas. The patient is nervous/anxious.  All other systems reviewed and are negative. LabsNo new labsCurrent MedicationsCurrent Outpatient Medications Medication Sig ? benztropine (COGENTIN) 1 mg tablet Take 1 tablet (1 mg total) by mouth 2 (two) times daily. ? cariprazine 4.5 mg Cap Take 1 capsule (4.5 mg total) by mouth daily. ? lithium (ESKALITH) 450 mg CR extended release tablet Take 1 tablet (450 mg total) by mouth 2 (two) times daily. ? lithium carbonate (LITHOBID CR) 300 mg CR extended release tablet Take 2 tablets (600 mg total) by mouth every morning. Re-start on 08/01/19 ? zolpidem (AMBIEN) 5 mg tablet Take 1 tablet (5 mg total) by mouth nightly as needed for sleep. No current facility-administered medications for this encounter.  Mental Status ExamGeneral AppearanceHabitus:  MediumGrooming:  GoodMusculoskeletalStrength and Tone: Strength normalGait and Station: Stable gait and stable posturePsychiatricAttitude: Cooperative, good eye contact and pleasantPsychomotor Behavior: restlessSpeech: normal rate, volume and prosodyMood: depressed   Patient reported mood:  I feel goodAffect: Congruent to reported moodThought Process: Coherent, logical, goal directedAssociations: NormalThought Content: Normal no auditory hallucinations, no command auditory hallucinations, no paranoid delusions, not perseverativeSuicidal Ideation: No current suicidal plan, ideation or intentHomicidal Ideation: No current homicidal ideation, plan or intentJudgment:  GoodInsight:  GoodCognitive EvaluationOrientation: Oriented to person, oriented to place and oriented to date/time Attention and Concentration:  Normal attention and concentrationMemory: Recent and remote memory intactLanguage:  Language intactFund of Knowledge: average.Abstract Reasoning: Normal capacity for abstract reasoningSafety and Risk AssessmentPSY RISK ASSESSMENT SAFE-T WITH C-SSRS C-SSRS: Suicidal Ideation:  Since Last Assessment  WISH TO BE DEAD:  No  CURRENT SUICIDAL THOUGHTS:  No  SUICIDAL THOUGHTS WITH METHOD:  No  SUICIDAL INTENT WITHOUT SPECIFIC PLAN:  No  INTENT WITH PLAN:  No  Have you ever done anything, started to do anything or prepared to do anything to end your life?:  No  Was it within the past 3 months?:  NoSuicidal Ideation Intensity :   FREQUENCY:  Many times each day  DURATION:  Less than 1 hour / Some of the time  CONTROLLABILITY:  Easily able to control thoughts  DETERRENTS:  Deterrents definitely stopped you from attempting suicide  REASONS FOR IDEATION:  Does not applyRisk Assessment:   Access to Lethal Methods:  NoCurrent and Past Psychiatric Diagnoses:    Mood Disorder:  Recurrent/Current  Psychotic Disorder:  No  Alcohol/Substance Abuse Disorders:  Recurrent/Current  PTSD:  No  ADHD:  No  TBI:  No  Cluster B Personality Disorders or Traits:  No  Conduct Problems:  No  Suicide Attempt:  No Prior Attempts  Presenting Symptoms:  Anhedonia:  Yes  Impulsivity:  No  Hopelessness or Despair:  Yes  Anxiety and/or Panic:  No  Insomnia:  No  Command Hallucinations:  No  Psychosis:  No  Self-injurious Behavior:  No  Physical Aggression Toward Others:  No  Violence, Victimization and/or Perpetration:  No  Family History:   Suicide:  No  Suicidal Behavior:  No  Axis I Psychiatric Diagnosis requiring hospitalization:  No  Precipitants / Stressors:   Triggering events leading to humiliation, shame and/or despair:  No  Chronic physical pain or other acute medical problem:  No  Sexual / Physical Abuse:  No  Substance Intoxication or Withdrawal:  No  Pending Incarceration:  No  Legal Problems:  No  Inadequate Social Supports:  No  Social Isolation:  No  Perceived burden on others:  No  Bullying / Cyberbullying:  No  Media portrayal of suicide:  No  Housing Insecurity:  No  Financial Insecurity:  No  Stressful Life Events:  Yes  Gender / Sexual Identity:  No  Homelessness:  No  Change in Treatment:    Recent inpatient discharge:  No  Change in provider or treatment:  No  Hopeless or dissatisfied with provider or treatment:  No  Non-compliant:  Yes  Not receiving treatment:  NoCurrent and Past Psychiatric Diagnoses:  Mood Disorder:  Recurrent/Current  Psychotic Disorder:  No  Alcohol/Substance Abuse Disorders:  Recurrent/Current  PTSD:  No  ADHD:  No  TBI:  No  Cluster B Personality Disorders or Traits:  No  Conduct Problems:  No  Suicide Attempt:  No Prior AttemptsProtective Factors:   Internal:   Ability to cope with stress:  Yes  Frustration tolerance:  Yes  Fear of death or the actual act of killing self:  Yes  Identifies reasons for living:  Yes  Problem solving skills:  Yes  External:   Responsibility to children:  Yes  Beloved pets:  Yes  Supportive social network of friends or family:  Yes  Positive therapeutic relationships / Effective mental healthcare:  YesRisk to Self - Self-Injurious Behavior:   Current Urges to harm Self:  No  Recent Self-Injury:  No  History of Self Injury:  No  Imminent Risk for Self Injury in Community:  Low  Imminent Risk for Self Injury in Facility:  LowRisk to Others:   Current Agitation:  No  Homicidal/Aggressive Ideation:  No  Homicidal/Aggressive Threat/Plan:  No  Recent Violence/Aggression:  NoWithdrawal Risk:   Alcohol / Benzodiazepines / Barbiturates:  Not applicable     Alcohol/Benzodiazepine/Barbiturate Risk for Withdrawal:  Absent  Opioids:  Not applicable     Opioid Risk for Withdrawal:  AbsentMEDICAL DECISION MAKING DiagnosisProblem List         ICD-10-CM   OP/IOP/PHP Psychiatry  MDD (major depressive disorder), recurrent severe, 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 Deretha reports improvements with anxiety and depressed mood overall (improved with lithium). Will repeat lithium level. Akathisia continues to be present. Will increase cogentin. Currently, no  imminent safety concerns. The Self-Report [place] Risk Suicide Severity Rating Scale Since Last Visit was completed by Margarita Grizzle and reviewed by myself today.  Additionally, risk assessment from [facility]'s intake assessment and follow up screenings have been reviewed.  Based on Carsyn B Trotta?s clinical presentation today as well as her self-reported symptom ratings today and over the current course of treatment, I have assessed Margarita Grizzle to be at unchanged risk of harm to self as compared with prior recent assessments.  Based on this assessment, I have continued current treatment plan including implementation of factors to mitigate risks of harm to self as outlined in the last risk assessmentIntervention(s)Supportive therapy Labs/Test/Consults OrderedNone today Medication ChangesIncrease cogentin 2mg  twice a dayPlanIncrease cogentin 2mg  twice a dayContinue lithium 1050mg  in AMContinue vraylar 4.5mg  Continue zolpidem 5mg  nightly as neededContinue hydroxyzine 25mg  every four hours as needed for anxiety Discussed side effects, pros & cons of treatment, and alternative treatment options. Reviewed safety plan to call the clinic, the National Suicide Prevention Lifeline: (435)321-2936, or  911 to go to the ER if safety is at risk. Not deemed at imminent risk of harm to self/others at this timePatient verbalized understanding and had no additional questions. Electronically Signed:Kendallyn Lippold, NP 08/18/2019 12:22 PM

## 2019-08-18 NOTE — Group Note
Group Session:  Group TherapyGroup Start Time:   12:10 PM      End Time:   1:00 PMFacilitators:  Cristino, Dorene Grebe, LCSWVIDEO TELEHEALTH VISIT, PLATFORM OTHER THAN MYCHART: This clinician is conducting this visit at a site within our enrolled clinical location..  For this visit the clinician and patient were present via interactive audio & video telecommunications system that permits real-time communications, via a platform other than MyChart.Platforms other than MyChart may be used only during the COVID-19 crisis, when MyChart is not an option, and video is required for clinical reasons. Reason for choice of video platform other than MyChart: Currently [facility] has identified Zoom as the appropriate telehealth platform to facilitate IOP Group Therapy.PLEASE NOTE: CONSENT FOR THIS VISIT IS DIFFERENT THAN FOR MYCHART VIDEO VISITS! Verbal consent must be obtained by the clinician or staff prior to starting visit.  Read the Following Statements to Video Visit Participants:  ?(Name of clinician) will be conducting your video visit today. Before we begin, we must obtain your consent for today's visit.Can you confirm your name, date of birth, and state where you (or your child, if applicable) are currently located?By consenting, you understand that today's visit will be conducted through video conferencing technology other than our usual secure service. While we do everything possible to ensure your privacy, we may not be able to guarantee the security of this particular platform. You understand that the visit will not be recorded, and that we will be documenting this visit in our electronic medical record, and that we may disclose records concerning this interaction to other clinicians. You understand that a video visit may limit our ability to diagnose your condition.  You understand that if you are experiencing a medical emergency, you will be directed to dial 9-1-1 as we are not able to connect you directly. You also understand if your condition changes after this visit and you need of further care, you will contact our office. Do you agree to this consent, knowing that you can change your decision at any time? Do you have any other questions before we proceed??Patient consent given for video visit: yes State patient is located in: [place] [facility] clinician is appropriately licensed in the above state to provide care for this visit.Other individuals present during the telehealth encounter and their role/relation: Identified group facilitator(s), support staff as needed, and fellow group participants.Session Focus Connection between feelings, thoughts and behaviorsDevelop skills to manage issues and improve functioningStatus of symptoms / issuesTreatment goal and skill use Number of Participants 6 Treatment Modality Group Therapy Interventions/Education Assisted patient addressing stressful life situationsAssisted patient with strategies for managing illness/symptomsEncouraged increased cooperation with othersEncouraged increased initiation in groupsEncouraged patient to increase treatment focusEncouraged patient to verbalize, rather than enactEncourages patient to provide constructive feedback to othersAssisted patient with strategies for addressing family issuesAssisted patient with strategies for addressing school issuesEncouraged acceptance, tolerance and respect for othersEncouraged appropriate expression of thoughts and feelingsEncouraged improved listening skillsEncouraged patient to request as-needed help solving problemsHelped decrease isolation/increase sense of belongingHelped identify feelings, thoughts and behaviors connectionHelped increase awareness of feelings, thoughts and behaviorsHelped patient acquire skills to build self-confidenceHelped patient identify strengthsHelped patient increase ability to gain support from othersHelped patient increase comfort with othersHelped patient increase interpersonal effectivenessHelped patient increase sense of acceptance by othersProvided assistance problem-solving challengesProvided positive reinforcement for successesProvided reinforcement for patient contributions to othersProvided reinforcement for use of adaptive coping skills Attendance            Partial attendance due to MD/APRN  Meeting.  Total number of minutes attended:  28Engagement InterventionsNot applicable - patient attended group without difficulty Degree of Participation Active Behavior Alert, Cooperative and Interactive Speech/Thought Process Coherent, Directed and Relevant Affect/Mood Euthymic Insight Moderate Judgment Moderate Response to Interventions Attentive, Engaged, Interested and Receptive Individualization Group # 3 Relaxation TechniquesGroup members were given education on the fight or flight response and some relaxation techniques. Group members were provided educational materials. Patient chose not to take a turn reading from handout. Education provided on the following relaxation techniques: deep breathing, imagery, progressive muscle relaxation. Examples of each strategy was demonstrated and practiced. Patient reported deep breathing or imagery are the technique she can implement as a regular practice. Plan Continue to assist patient with skills to improve functionContinue to assist patient with skills to manage symptomsContinue to reinforce use of positive coping skillsContinue to review and assess symptoms / issuesContinue to review and assess treatment goals / skill useContinue per Plan of Care / Interdisciplinary Plan of Care Zoe Scott Cristino, LCSW6/9/20215:46 PM

## 2019-08-18 NOTE — Other
Group Start Time: ??10:00 AM??????End Time: ?11:00 AMFacilitators:??Cindie Crumbly, LCSWVIDEO TELEHEALTH VISIT, PLATFORM OTHER THAN MYCHART: This clinician is conducting this visit at a site within our enrolled clinical location.Marland Kitchen ?For this visit the clinician and patient were present via interactive audio & video telecommunications system that permits real-time communications, via a platform other than MyChart.?Platforms other than MyChart may be used only during the COVID-19 crisis, when MyChart is not an option, and video is required for clinical reasons. ?Reason for choice of video platform other than MyChart: Currently [facility] has identified Zoom as the appropriate telehealth platform to facilitate IOP Group Therapy.?PLEASE NOTE: CONSENT FOR THIS VISIT IS DIFFERENT THAN FOR MYCHART VIDEO VISITS! ?Verbal consent must be obtained by the clinician or staff prior to starting visit.??Read the Following Statements to Video Visit Participants:????(Name of clinician) will be conducting your video visit today. Before we begin, we must obtain your consent for today's visit.Can you confirm your name, date of birth, and state where you (or your child, if applicable) are currently located?By consenting, you understand that today's visit will be conducted through video conferencing technology other than our usual secure service. While we do everything possible to ensure your privacy, we may not be able to guarantee the security of this particular platform. You understand that the visit will not be recorded, and that we will be documenting this visit in our electronic medical record, and that we may disclose records concerning this interaction to other clinicians. You understand that a video visit may limit our ability to diagnose your condition.??You understand that if you are experiencing a medical emergency, you will be directed to dial 9-1-1 as we are not able to connect you directly. You also understand if your condition changes after this visit and you need of further care, you will contact our office. Do you agree to this consent, knowing that you can change your decision at any time? Do you have any other questions before we proceed???Patient consent given for video visit: yes?State patient is located in: [place] At home?LocationThe clinician is appropriately licensed in the above state to provide care for this visit.??Session Focus? Connection between feelings, thoughts and behaviorsDevelop skills to manage issues and improve functioningStatus of symptoms / issuesTreatment goal and skill useDaily goal setting Number of Participants? 6 Treatment Modality? Group Therapy Interventions/Education? Assisted patient addressing stressful life situationsAssisted patient with discharge planningAssisted patient with strategies for addressing peer issuesAssisted patient with strategies for managing illness/symptomsAssisted patient with terminationEncouraged increased cooperation with othersEncouraged increased initiation in groupsEncouraged patient to increase treatment focusEncouraged patient to verbalize, rather than enactEncourages patient to provide constructive feedback to othersAssisted patient with strategies for addressing family issuesAssisted patient with strategies for addressing school issuesEncouraged acceptance, tolerance and respect for othersEncouraged appropriate expression of thoughts and feelingsEncouraged improved listening skillsEncouraged patient to request as-needed help solving problemsHelped decrease isolation/increase sense of belongingHelped identify feelings, thoughts and behaviors connectionHelped increase awareness of feelings, thoughts and behaviorsHelped patient acquire skills to build self-confidenceHelped patient identify strengthsHelped patient increase ability to gain support from othersHelped patient increase comfort with othersHelped patient increase interpersonal effectivenessHelped patient increase sense of acceptance by othersProvided assistance problem-solving challengesProvided positive reinforcement for successesProvided reinforcement for patient contributions to othersProvided reinforcement for use of adaptive coping skills ?Attendance???????????? AttendedEngagement InterventionsNot applicable - patient attended group without difficulty Degree of Participation? Active Behavior Alert, Cooperative, and Interactive Speech/Thought Process Coherent, Directed and Relevant Affect/Mood Anxious, Depressed and Irritable Insight Moderate Judgment Moderate Response to Interventions? Attentive, Engaged, Interested and Receptive Individualization? ?SUMMARY  NOTE Group 1:During IOP Group Therapy via ZOOM Patient self-reported on a scale of 0 = None to 10= Highest; patient described their most concerning symptomatology as; depression 6, irritability 5, rumination 4, racing thoughts 2, anxiety 8, drug cravings 4 and 0 for all other assessed symptomatology.  Patient report was congruent with the patient's observed presentation.  Patient identifies things that went well/grateful for; roof over my head, nice weather, my bald spot is growing back.  Patient identified purposely working on the following skill; getting out more, ?I need to eat more?.  Patient identifies that her current medication regimen she feels like she is crawling out of her skin, and losing a lot a weight feel like I am on Adderall.  Patient also reported to spending money that she does not necessarily intend to spend but not manic spending rather buying things to feel better and feeling guilty afterwards.  Patient continues to reiterate inconsistency in nursing services.  Patient reports to be taking their medication as prescribed and reports no concerns noted at this time. Patient reports currently to not be experiencing any ideation, intent, or plans in reference to suicidal or homicidal symptomatology. Plan? Continue to assist patient with skills to improve functionContinue to assist patient with skills to manage symptomsContinue to reinforce use of positive coping skillsContinue to review and assess symptoms / issuesContinue to review and assess treatment goals / skill useContinue per Plan of Care / Interdisciplinary Plan of Care ??C-SSRS Since Last Visit? ? [place] - Suicide Severity Screen? ? Most Recent Value Have you wished you were dead or wished you could go to sleep and not wake up? ?No Have you actually had any thoughts of killing yourself?  ?No Have you been thinking about how you might kill yourself?  ?No [place] Suicide Risk Level ?Low Risk ??Cindie Crumbly, LCSW6/9/20215:27 PM

## 2019-08-19 ENCOUNTER — Inpatient Hospital Stay: Admit: 2019-08-19 | Discharge: 2019-08-19 | Payer: MEDICAID | Primary: Cardiovascular Disease

## 2019-08-19 DIAGNOSIS — F1210 Cannabis abuse, uncomplicated: Secondary | ICD-10-CM

## 2019-08-19 DIAGNOSIS — F3189 Other bipolar disorder: Secondary | ICD-10-CM

## 2019-08-19 DIAGNOSIS — F332 Major depressive disorder, recurrent severe without psychotic features: Secondary | ICD-10-CM
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
## 2019-08-20 ENCOUNTER — Encounter: Admit: 2019-08-20 | Payer: PRIVATE HEALTH INSURANCE | Primary: Cardiovascular Disease

## 2019-08-20 NOTE — Progress Notes
Pt's visiting nurse Nelva Bush mendezLPN [phone] )called to verify benztropine was increased from 1mg  po bid to 2mg  po bid. Writer reviewed chart and verified that pt benztropine was increased to 2mg   By mouth twice daily due to pt's report of restlessness and Pacing on 08/18/2019.  Lizette LPN acknowledged verbal understanding. Vance Gather, RN6/11/202111:58 AM
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
## 2019-08-23 ENCOUNTER — Inpatient Hospital Stay: Admit: 2019-08-23 | Discharge: 2019-08-23 | Payer: MEDICAID | Primary: Cardiovascular Disease

## 2019-08-23 DIAGNOSIS — F3189 Other bipolar disorder: Secondary | ICD-10-CM

## 2019-08-23 DIAGNOSIS — F1210 Cannabis abuse, uncomplicated: Secondary | ICD-10-CM

## 2019-08-23 DIAGNOSIS — F411 Generalized anxiety disorder: Secondary | ICD-10-CM

## 2019-08-23 NOTE — Other
Group Session: ?Group Therapy?Group Start Time: ??12:10 PM??????End Time: ?1:00 PMFacilitators:  Cindie Crumbly, LCSWVIDEO TELEHEALTH VISIT, PLATFORM OTHER THAN MYCHART: This clinician is conducting this visit at a site within our enrolled clinical location.Marland Kitchen ?For this visit the clinician and patient were present via interactive audio & video telecommunications system that permits real-time communications, via a platform other than MyChart.?Platforms other than MyChart may be used only during the COVID-19 crisis, when MyChart is not an option, and video is required for clinical reasons. ?Reason for choice of video platform other than MyChart: Currently [facility] has identified Zoom as the appropriate telehealth platform to facilitate IOP Group Therapy.?PLEASE NOTE: CONSENT FOR THIS VISIT IS DIFFERENT THAN FOR MYCHART VIDEO VISITS! ?Verbal consent must be obtained by the clinician or staff prior to starting visit.??Read the Following Statements to Video Visit Participants:????(Name of clinician) will be conducting your video visit today. Before we begin, we must obtain your consent for today's visit.Can you confirm your name, date of birth, and state where you (or your child, if applicable) are currently located?By consenting, you understand that today's visit will be conducted through video conferencing technology other than our usual secure service. While we do everything possible to ensure your privacy, we may not be able to guarantee the security of this particular platform. You understand that the visit will not be recorded, and that we will be documenting this visit in our electronic medical record, and that we may disclose records concerning this interaction to other clinicians. You understand that a video visit may limit our ability to diagnose your condition.??You understand that if you are experiencing a medical emergency, you will be directed to dial 9-1-1 as we are not able to connect you directly. You also understand if your condition changes after this visit and you need of further care, you will contact our office. Do you agree to this consent, knowing that you can change your decision at any time? Do you have any other questions before we proceed???Patient consent given for video visit: yes?State patient is located in: [place] At home?LocationThe clinician is appropriately licensed in the above state to provide care for this visit.??Session Focus? Connection between feelings, thoughts and behaviorsDevelop skills to manage issues and improve functioningStatus of symptoms / issuesTreatment goal and skill useDaily goal setting Number of Participants? 6 Treatment Modality? Group Therapy Interventions/Education? Assisted patient addressing stressful life situationsAssisted patient with discharge planningAssisted patient with strategies for addressing peer issuesAssisted patient with strategies for managing illness/symptomsAssisted patient with terminationEncouraged increased cooperation with othersEncouraged increased initiation in groupsEncouraged patient to increase treatment focusEncouraged patient to verbalize, rather than enactEncourages patient to provide constructive feedback to othersAssisted patient with strategies for addressing family issuesAssisted patient with strategies for addressing school issuesEncouraged acceptance, tolerance and respect for othersEncouraged appropriate expression of thoughts and feelingsEncouraged improved listening skillsEncouraged patient to request as-needed help solving problemsHelped decrease isolation/increase sense of belongingHelped identify feelings, thoughts and behaviors connectionHelped increase awareness of feelings, thoughts and behaviorsHelped patient acquire skills to build self-confidenceHelped patient identify strengthsHelped patient increase ability to gain support from othersHelped patient increase comfort with othersHelped patient increase interpersonal effectivenessHelped patient increase sense of acceptance by othersProvided assistance problem-solving challengesProvided positive reinforcement for successesProvided reinforcement for patient contributions to othersProvided reinforcement for use of adaptive coping skills ?Attendance???????????? AttendedEngagement InterventionsNot applicable - patient attended group without difficulty Degree of Participation? Active Behavior Alert, Cooperative, and Interactive Speech/Thought Process Coherent, Directed and Relevant Affect/Mood Anxious, Depressed and Irritable Insight Moderate Judgment Moderate Response to Interventions? Attentive, Engaged,  Interested and Receptive Individualization? SUMMARY NOTE Group 3:During IOP Group Therapy via ZOOM Patients were presented handout on Self Sabotage.  Patient's were presented the definition of sabotage and common ways people sabotage.  There were encouraged to discuss what ways they may sabotage themselves, why, and what they can do to change this behavior.  Specifically this patient identified ?sometimes I avoiding to not address things, I am not as consistent in reference to working towards change and goals.  Patient appeared to be moving around a lot group moving from room to room her house to avoid family members and was not is present during this group.  Patient reports currently to not be experiencing any ideation, intent, or plans in reference to suicidal or homicidal symptomatology. Plan? Continue to assist patient with skills to improve functionContinue to assist patient with skills to manage symptomsContinue to reinforce use of positive coping skillsContinue to review and assess symptoms / issuesContinue to review and assess treatment goals / skill useContinue per Plan of Care / Interdisciplinary Plan of Care ?Cindie Crumbly, LCSW6/14/20215:43 PM
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
## 2019-08-24 ENCOUNTER — Encounter: Admit: 2019-08-24 | Payer: PRIVATE HEALTH INSURANCE | Primary: Cardiovascular Disease

## 2019-08-24 ENCOUNTER — Inpatient Hospital Stay: Admit: 2019-08-24 | Discharge: 2019-08-24 | Payer: MEDICAID | Primary: Cardiovascular Disease

## 2019-08-24 DIAGNOSIS — F1210 Cannabis abuse, uncomplicated: Secondary | ICD-10-CM

## 2019-08-24 DIAGNOSIS — F3189 Other bipolar disorder: Secondary | ICD-10-CM

## 2019-08-24 LAB — ZZZTOXICOLOGY SCREEN, URINE     (BH L)
BKR 6-ACETYLMORPHINE: NEGATIVE
BKR AMPHETAMINE SCREEN, URINE: NEGATIVE
BKR BARBITURATE SCREEN, URINE: NEGATIVE
BKR BENZODIAZEPINE SCREEN, URINE: NEGATIVE
BKR CANNABINOID SCREEN, URINE: POSITIVE — AB
BKR COCAINE METABOLITES URINE: NEGATIVE
BKR ETHANOL URINE: NEGATIVE
BKR METHADONE METABOLITE SCREEN, URINE, NO CONF.: NEGATIVE
BKR OPIATE SCREEN, URINE: NEGATIVE
BKR OXYCODONE SCREEN, URINE: NEGATIVE
BKR PHENCYCLIDINE SCREEN, URINE: NEGATIVE
BKR PROPOXYPHENE: NEGATIVE

## 2019-08-24 LAB — LITHIUM LEVEL: BKR LITHIUM LEVEL: 0.62 mmol/L (ref 0.60–1.20)

## 2019-08-24 MED ORDER — HYDROXYZINE HCL 25 MG TABLET
25 mg | ORAL | Status: AC | PRN
Start: 2019-08-24 — End: 2019-09-08

## 2019-08-24 MED ORDER — LITHIUM CARBONATE ER 300 MG TABLET,EXTENDED RELEASE
300 mg | ORAL_TABLET | Freq: Every morning | ORAL | 1 refills | Status: AC
Start: 2019-08-24 — End: 2019-08-24

## 2019-08-24 MED ORDER — LITHIUM CARBONATE ER 450 MG TABLET,EXTENDED RELEASE
450 mg | ORAL_TABLET | Freq: Every day | ORAL | 1 refills | Status: AC
Start: 2019-08-24 — End: 2019-08-25

## 2019-08-24 MED ORDER — LITHIUM CARBONATE ER 300 MG TABLET,EXTENDED RELEASE
300 mg | ORAL_TABLET | Freq: Every morning | ORAL | 1 refills | Status: AC
Start: 2019-08-24 — End: 2019-08-25
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
## 2019-08-24 NOTE — Telephone Encounter
Writer called and spoke with Michiel Cowboy LPN ([phone])  . Lizette LPN reported pt was taking her lithium 1050mg  daily in the am po. Pt also was taking hydroxyzine as needed . Writer asked if pt obtained lithium level as ordered LPN reported she will check with pt. Vance Gather, RN6/15/20212:32 PM

## 2019-08-24 NOTE — Group Note
Group Session:  Group TherapyGroup Start Time:   11:10 AM      End Time:  12:00 PMFacilitators:  Kevan Ny, Velora Mediate, PCTVIDEO TELEHEALTH VISIT, PLATFORM OTHER THAN MYCHART: This clinician is conducting this visit at a site within our enrolled clinical location..  For this visit the clinician and patient were present via interactive audio & video telecommunications system that permits real-time communications, via a platform other than MyChart.Platforms other than MyChart may be used only during the COVID-19 crisis, when MyChart is not an option, and video is required for clinical reasons. Reason for choice of video platform other than MyChart: Currently [facility] has identified Zoom as the appropriate telehealth platform to facilitate IOP Group Therapy.PLEASE NOTE: CONSENT FOR THIS VISIT IS DIFFERENT THAN FOR MYCHART VIDEO VISITS! Verbal consent must be obtained by the clinician or staff prior to starting visit.  Read the Following Statements to Video Visit Participants:  ?(Name of clinician) will be conducting your video visit today. Before we begin, we must obtain your consent for today's visit.Can you confirm your name, date of birth, and state where you (or your child, if applicable) are currently located?By consenting, you understand that today's visit will be conducted through video conferencing technology other than our usual secure service. While we do everything possible to ensure your privacy, we may not be able to guarantee the security of this particular platform. You understand that the visit will not be recorded, and that we will be documenting this visit in our electronic medical record, and that we may disclose records concerning this interaction to other clinicians. You understand that a video visit may limit our ability to diagnose your condition.  You understand that if you are experiencing a medical emergency, you will be directed to dial 9-1-1 as we are not able to connect you directly. You also understand if your condition changes after this visit and you need of further care, you will contact our office. Do you agree to this consent, knowing that you can change your decision at any time? Do you have any other questions before we proceed??Patient consent given for video visit: yesState patient is located in: [facility] clinician is appropriately licensed in the above state to provide care for this visit.Other individuals present during the telehealth encounter and their role/relation: Identified group facilitator(s), support staff as needed, and fellow group participants.Session Focus Connection between feelings, thoughts and behaviorsDevelop skills to manage issues and improve functioningStatus of symptoms / issuesTreatment goal and skill useDaily goal setting Number of Participants 6 Treatment Modality Group Therapy Interventions/Education Assisted patient addressing stressful life situationsAssisted patient with strategies for addressing peer issuesAssisted patient with strategies for managing illness/symptomsEncouraged increased cooperation with othersEncouraged increased initiation in groupsEncouraged patient to increase treatment focusEncouraged patient to verbalize, rather than enactEncourages patient to provide constructive feedback to othersAssisted patient with strategies for addressing family issuesAssisted patient with strategies for addressing school issuesEncouraged acceptance, tolerance and respect for othersEncouraged appropriate expression of thoughts and feelingsEncouraged improved listening skillsEncouraged patient to request as-needed help solving problemsHelped decrease isolation/increase sense of belongingHelped identify feelings, thoughts and behaviors connectionHelped increase awareness of feelings, thoughts and behaviorsHelped patient acquire skills to build self-confidenceHelped patient identify strengthsHelped patient increase ability to gain support from othersHelped patient increase comfort with othersHelped patient increase interpersonal effectivenessHelped patient increase sense of acceptance by othersProvided assistance problem-solving challengesProvided positive reinforcement for successesProvided reinforcement for patient contributions to othersProvided reinforcement for use of adaptive coping skills Attendance  AttendedEngagement InterventionsNot applicable - patient attended group without difficulty Degree of Participation Active Behavior Alert, Calm, Cooperative, Interactive and Oriented Speech/Thought Process Coherent, Focused, Organized and Relevant Affect/Mood Euthymic and Full range Insight Moderate Judgment Moderate Response to Interventions Attentive, Engaged, Interested and Receptive Individualization Summary Note Group 2During Zoom group therapy patient actively participated in the discussion of ?Grounding Techniques? . This exercise asks ?What are five things you can see?? ?What are 4 things you can feel?? What are three things you can hear?? What are Two things you can smell?   And one thing that is a favorite taste. This exercise is used to help decrease symptoms of anxiety, stress or negative thoughts focusing on the present time . Reviewed how negative automatic thoughts can also contribute to feeling overwhelmed and anxious.Patient stated they felt this coping strategy was helpful and he stated she plans to use it in the future as needed.Patient reported not experiencing any current suicidal or homicidal thought, plan or intent. Plan Continue to assist patient with skills to improve functionContinue to assist patient with skills to manage symptomsContinue to reinforce use of positive coping skillsContinue to review and assess symptoms / issuesContinue to review and assess treatment goals / skill useContinue per Plan of Care / Interdisciplinary Plan of Care [name], LCSW6/14/20213:59 PM

## 2019-08-24 NOTE — Progress Notes
Michiel Cowboy LPN ([phone]) called and reported pt obtained a urine toxicology and lithium level today.  Vance Gather, RN6/15/2021 2:40 PM
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
## 2019-08-25 ENCOUNTER — Inpatient Hospital Stay: Admit: 2019-08-25 | Discharge: 2019-08-25 | Payer: MEDICAID | Primary: Cardiovascular Disease

## 2019-08-25 DIAGNOSIS — F411 Generalized anxiety disorder: Secondary | ICD-10-CM

## 2019-08-25 DIAGNOSIS — F3189 Other bipolar disorder: Secondary | ICD-10-CM

## 2019-08-25 DIAGNOSIS — F332 Major depressive disorder, recurrent severe without psychotic features: Secondary | ICD-10-CM

## 2019-08-25 MED ORDER — LITHIUM CARBONATE ER 450 MG TABLET,EXTENDED RELEASE
450 mg | ORAL_TABLET | Freq: Every day | ORAL | 1 refills | Status: AC
Start: 2019-08-25 — End: 2019-09-08

## 2019-08-25 NOTE — Group Note
Group Session:  Group TherapyGroup Start Time:   11:10 AM      End Time:  12:00 PMFacilitators:  Cristino, Dorene Grebe, LCSW; [place], Janyth Pupa, PCT VIDEO TELEHEALTH VISIT, PLATFORM OTHER THAN MYCHART: This clinician is conducting this visit at a site within our enrolled clinical location..  For this visit the clinician and patient were present via interactive audio & video telecommunications system that permits real-time communications, via a platform other than MyChart.Platforms other than MyChart may be used only during the COVID-19 crisis, when MyChart is not an option, and video is required for clinical reasons. Reason for choice of video platform other than MyChart: Currently [facility] has identified Zoom as the appropriate telehealth platform to facilitate IOP Group Therapy.PLEASE NOTE: CONSENT FOR THIS VISIT IS DIFFERENT THAN FOR MYCHART VIDEO VISITS! Verbal consent must be obtained by the clinician or staff prior to starting visit.  Read the Following Statements to Video Visit Participants:  ?(Name of clinician) will be conducting your video visit today. Before we begin, we must obtain your consent for today's visit.Can you confirm your name, date of birth, and state where you (or your child, if applicable) are currently located?By consenting, you understand that today's visit will be conducted through video conferencing technology other than our usual secure service. While we do everything possible to ensure your privacy, we may not be able to guarantee the security of this particular platform. You understand that the visit will not be recorded, and that we will be documenting this visit in our electronic medical record, and that we may disclose records concerning this interaction to other clinicians. You understand that a video visit may limit our ability to diagnose your condition.  You understand that if you are experiencing a medical emergency, you will be directed to dial 9-1-1 as we are not able to connect you directly. You also understand if your condition changes after this visit and you need of further care, you will contact our office. Do you agree to this consent, knowing that you can change your decision at any time? Do you have any other questions before we proceed??Patient consent given for video visit: yes [place] patient is located in:  [facility] is appropriately licensed in the above state to provide care for this visit.Other individuals present during the telehealth encounter and their role/relation: Identified group facilitator(s), support staff as needed, and fellow group participants.Session Focus Connection between feelings, thoughts and behaviorsDevelop skills to manage issues and improve functioningStatus of symptoms / issuesTreatment goal and skill useDaily goal setting Number of Participants 5 Treatment Modality Group Therapy Interventions/Education Assisted patient addressing stressful life situationsAssisted patient with strategies for managing illness/symptomsEncouraged increased cooperation with othersEncouraged increased initiation in groupsEncouraged patient to increase treatment focusEncouraged patient to verbalize, rather than enactEncourages patient to provide constructive feedback to othersAssisted patient with strategies for addressing family issuesAssisted patient with strategies for addressing school issuesEncouraged acceptance, tolerance and respect for othersEncouraged appropriate expression of thoughts and feelingsEncouraged improved listening skillsEncouraged patient to request as-needed help solving problemsHelped decrease isolation/increase sense of belongingHelped identify feelings, thoughts and behaviors connectionHelped increase awareness of feelings, thoughts and behaviorsHelped patient acquire skills to build self-confidenceHelped patient identify strengthsHelped patient increase ability to gain support from othersHelped patient increase comfort with othersHelped patient increase interpersonal effectivenessHelped patient increase sense of acceptance by othersProvided assistance problem-solving challengesProvided positive reinforcement for successesProvided reinforcement for patient contributions to othersProvided reinforcement for use of adaptive coping skills Attendance  AttendedEngagement InterventionsNot applicable - patient attended group without difficulty Degree of Participation Active Behavior Alert, Cooperative and Interactive Speech/Thought Process Coherent, Directed and Relevant Affect/Mood Anxious, Euthymic and Full range Insight Moderate Judgment Moderate Response to Interventions Attentive, Engaged, Interested and Receptive Individualization Group #2 Coping skillsPatient engaged in group session focused on recognizing the symptoms of stress physically, emotional, and behaviorally and ways to manage stress. A virtual handout identified ways to manage stress using coping skills in the categories of diversion, social/interpersonal, tension releasers, physical action, and spiritual activities. Patient chose to not take a turn reading from handout. Group discussed connection between situation, thoughts, emotions, and behaviors.  Patient talked about trigger of an argument and/or someone using harsh words toward her, the thoughts it brings up (that she is being disrespected or with her mother that she is not being supported), what she then feels (anger) and how she then behaves.  Patient was able to identify at least 2 coping skills in each category mentioned above to manage stressors/triggers.  Group members wrote down the skills they can implement from each category.  Plan Continue to assist patient with skills to improve functionContinue to assist patient with skills to manage symptomsContinue to reinforce use of positive coping skillsContinue to review and assess symptoms / issuesContinue to review and assess treatment goals / skill useContinue per Plan of Care / Interdisciplinary Plan of Care Toivo Bordon Cristino, LCSW6/16/20213:47 PM

## 2019-08-25 NOTE — Other
Group Session:  Group Therapy Group Start Time:   12:10 PM      End Time:  1:00 PMFacilitators:  Cindie Crumbly, LCSWVIDEO TELEHEALTH VISIT, PLATFORM OTHER THAN MYCHART: This clinician is conducting this visit at a site within our enrolled clinical location.Marland Kitchen ?For this visit the clinician and patient were present via interactive audio & video telecommunications system that permits real-time communications, via a platform other than MyChart.?Platforms other than MyChart may be used only during the COVID-19 crisis, when MyChart is not an option, and video is required for clinical reasons. ?Reason for choice of video platform other than MyChart: Currently [facility] has identified Zoom as the appropriate telehealth platform to facilitate IOP Group Therapy.?PLEASE NOTE: CONSENT FOR THIS VISIT IS DIFFERENT THAN FOR MYCHART VIDEO VISITS! ?Verbal consent must be obtained by the clinician or staff prior to starting visit.??Read the Following Statements to Video Visit Participants:????(Name of clinician) will be conducting your video visit today. Before we begin, we must obtain your consent for today's visit.Can you confirm your name, date of birth, and state where you (or your child, if applicable) are currently located?By consenting, you understand that today's visit will be conducted through video conferencing technology other than our usual secure service. While we do everything possible to ensure your privacy, we may not be able to guarantee the security of this particular platform. You understand that the visit will not be recorded, and that we will be documenting this visit in our electronic medical record, and that we may disclose records concerning this interaction to other clinicians. You understand that a video visit may limit our ability to diagnose your condition.??You understand that if you are experiencing a medical emergency, you will be directed to dial 9-1-1 as we are not able to connect you directly. You also understand if your condition changes after this visit and you need of further care, you will contact our office. Do you agree to this consent, knowing that you can change your decision at any time? Do you have any other questions before we proceed???Patient consent given for video visit: yes?State patient is located in: [place] home?LocationThe clinician is appropriately licensed in the above state to provide care for this visit.??Session Focus? Connection between feelings, thoughts and behaviorsDevelop skills to manage issues and improve functioningStatus of symptoms / issuesTreatment goal and skill useDaily goal setting Number of Participants? 5 Treatment Modality? Group Therapy Interventions/Education? Assisted patient addressing stressful life situationsAssisted patient with discharge planningAssisted patient with strategies for addressing peer issuesAssisted patient with strategies for managing illness/symptomsAssisted patient with terminationEncouraged increased cooperation with othersEncouraged increased initiation in groupsEncouraged patient to increase treatment focusEncouraged patient to verbalize, rather than enactEncourages patient to provide constructive feedback to othersAssisted patient with strategies for addressing family issuesAssisted patient with strategies for addressing school issuesEncouraged acceptance, tolerance and respect for othersEncouraged appropriate expression of thoughts and feelingsEncouraged improved listening skillsEncouraged patient to request as-needed help solving problemsHelped decrease isolation/increase sense of belongingHelped identify feelings, thoughts and behaviors connectionHelped increase awareness of feelings, thoughts and behaviorsHelped patient acquire skills to build self-confidenceHelped patient identify strengthsHelped patient increase ability to gain support from othersHelped patient increase comfort with othersHelped patient increase interpersonal effectivenessHelped patient increase sense of acceptance by othersProvided assistance problem-solving challengesProvided positive reinforcement for successesProvided reinforcement for patient contributions to othersProvided reinforcement for use of adaptive coping skills ?Attendance???????????? AttendedEngagement InterventionsNot applicable - patient attended group without difficulty Degree of Participation? Active Behavior Alert, Cooperative, and Interactive Speech/Thought Process Coherent, Directed and Relevant Affect/Mood Anxious, Depressed  Insight Moderate Judgment Moderate Response to Interventions? Attentive, Engaged, Interested and Receptive Individualization? SUMMARY NOTE Group 3:During IOP Group Therapy via ZOOM Patients were presented a group on encouraging more comfort, participation, open verbalization and sharing in group treatment.  Clinician encouraged patient is to discuss barriers to verbally engaging in treatment as well as any fears they had in relation to participation.  This was an open group patients were free to discuss their thoughts feelings and fears as well as give each other feedback.  Specifically this patient identified  ?I was skeptical and I 1st came I thought that it was not right that I had to be here, and I really thought that people would judge me but I did get good advice and the people that have talk to me and shared with me have helped me.  Patient was spontaneously given feedback from peers that they do not judge her and see themselves as similar or can relate to her circumstances and situation.  Patient was also provided feedback that she is strong for balancing the role of single parent in attending IOP treatment and patient seemed to benefit from this type of emotional support and feedback.. Patient reports currently to not be experiencing any ideation, intent, or plans in reference to suicidal or homicidal symptomatology. Plan? Continue to assist patient with skills to improve functionContinue to assist patient with skills to manage symptomsContinue to reinforce use of positive coping skillsContinue to review and assess symptoms / issuesContinue to review and assess treatment goals / skill useContinue per Plan of Care / Interdisciplinary Plan of Care ?Cindie Crumbly, LCSW6/16/20212:27 PM

## 2019-08-25 NOTE — Progress Notes
VIDEO TELEHEALTH VISIT, PLATFORM OTHER THAN MYCHART: This clinician is conducting this visit at a site within our enrolled clinical location..  For this visit the clinician and patient were present via interactive audio & video telecommunications system that permits real-time communications, via a platform other than MyChart.Platforms other than MyChart may be used only during the COVID-19 crisis, when MyChart is not an option, and video is required for clinical reasons. Reason for choice of video platform other than MyChart: Patient unable to access MyChart Video secondary to currently being in [employer] via [employer].PLEASE NOTE: CONSENT FOR THIS VISIT IS DIFFERENT THAN FOR MYCHART VIDEO VISITS! Verbal consent must be obtained by the clinician or staff prior to starting visit.  Read the Following Statements to Video Visit Participants:  ?(Name of clinician) will be conducting your video visit today. Before we begin, we must obtain your consent for today's visit.Can you confirm your name, date of birth, and state where you (or your child, if applicable) are currently located?By consenting, you understand that today's visit will be conducted through video conferencing technology other than our usual secure service. While we do everything possible to ensure your privacy, we may not be able to guarantee the security of this particular platform. You understand that the visit will not be recorded, and that we will be documenting this visit in our electronic medical record, and that we may disclose records concerning this interaction to other clinicians. You understand that a video visit may limit our ability to diagnose your condition.  You understand that if you are experiencing a medical emergency, you will be directed to dial 9-1-1 as we are not able to connect you directly. You also understand if your condition changes after this visit and you need of further care, you will contact our office. Do you agree to this consent, knowing that you can change your decision at any time? Do you have any other questions before we proceed??Patient consent given for video visit: yesState patient is located in: [place] @ home address The clinician is appropriately licensed in the above state to provide care for this visit.Other individuals present during the telehealth encounter and their role/relation: noneTotal time spent in medical video consultation (required if billing based on time): 30 minutes  Start Time: 10:39  End Time: 11:09[facility] [phone] [address]Bridge[address] [place] [address] Number: [phone] Number: [phone] Outpatient Psychiatry MD/LIP Progress Note6/16/2021Start Time:  10:39      End Time:  11:09Session Type:  Medication ManagementAmanda B Fischel is a 24 y.o., Single, female.SUBJECTIVE Chief Complaint: I just feel like I don't want to exist anymore.  Interim History:  24 year old female with past psychiatric history of bipolar disrder (manic and depressive) and a history of suicide attempts and anxiety disorder referred to [facility] adult IOP by [facility] 9 following an inpatient admission for worsening depression and anxiety symptoms at the beginning of March 2021. However, she finished a 1/2 of a group and then left the program. On Thursday, 06/17/2019, [name] called [facility] requesting a refill on her medication.  She reported feeling sick and very nauseated and lethargic. She was given a refill and started IOP on, 06/21/19. Her current medications include: Lithium 1050mg  in the AM Vraylar 4.5mg  nightly Zolpidem 5mg  nightly as needed for sleep Cogentin 2mg  twice a day Continue hydroxyzine 25mg  every four hours as needed for anxiety Sya's most recent lithium level on 08/24/19: 0.62 at lithium 1050mg  daily. Danah reports  some improvements in depression but continues to experience depressed mood and sadness. She reports, I am always negative and sad. I get angry and have serious anger issues. She reports I have thoughts that I don't want to exist anymore. I wouldn't do anything because of my son. Izzabelle reports, It's hard to go by myself because I am in isolation for so long that Im used to being isolated. In public feels unnatural. Discussed Lakisha's feelings of helplessness and how this contributes to worsening mood. Discussed and reviewed her lack of motivation in doing activities. She reports, I want to do things like go to the zoo but then I don't do it. Mary-Ann reports lower appetite and doesn't want to eat. She explains losing 8 pounds in two weeks and that she can't eat and maybe has an eating disorder. Avon reports, I'm losing myself and it's hard to do shower and brush my teeth. Plan: Increase lithium 1350mg  in AMCogentin cogentin 2mg  twice a dayContinue vraylar 4.5mg  Continue zolpidem 5mg  nightly as neededContinue hydroxyzine 25mg  every four hours as needed for anxiety (encouraged her to use hydroxyzine throughout the weekend for heightened anxiety) Currently no imminent safety concerns. ?REVIEW OF ALLERGIES/MEDICATIONS/HISTORY I have reviewed the patient's current medications and allergiesI have reviewed with the patient, the risks and benefits of the medications prescribed.OBJECTIVE Review of SystemsReview of Systems Psychiatric/Behavioral: Positive for dysphoric mood and suicidal ideas. The patient is nervous/anxious.  All other systems reviewed and are negative. LabsNo new labsCurrent MedicationsCurrent Outpatient Medications Medication Sig ? benztropine (COGENTIN) 2 mg tablet Take 1 tablet (2 mg total) by mouth 2 (two) times daily. ? cariprazine 4.5 mg Cap Take 1 capsule (4.5 mg total) by mouth daily. ? hydrOXYzine (ATARAX) 25 mg tablet Take 25 mg by mouth every 4 (four) hours as needed for anxiety. ? lithium (ESKALITH) 450 mg CR extended release tablet Take 1 tablet (450 mg total) by mouth daily. ? lithium carbonate (LITHOBID CR) 300 mg CR extended release tablet Take 2 tablets (600 mg total) by mouth every morning. ? zolpidem (AMBIEN) 5 mg tablet Take 1 tablet (5 mg total) by mouth nightly as needed for sleep. No current facility-administered medications for this encounter.  Mental Status ExamGeneral AppearanceHabitus:  MediumGrooming:  GoodMusculoskeletalStrength and Tone: Strength normalGait and Station: Stable gait and stable posturePsychiatricAttitude: Cooperative, good eye contact and pleasantPsychomotor Behavior: No psychomotor activation or retardationSpeech: normal rate, volume and prosodyMood: sad, depressed   Patient reported mood:  I feel goodAffect: Congruent to reported moodThought Process: Coherent, logical, goal directedAssociations: NormalThought Content: Normal no auditory hallucinations, no command auditory hallucinations, no paranoid delusions, not perseverativeSuicidal Ideation: No current suicidal plan, ideation or intentHomicidal Ideation: No current homicidal ideation, plan or intentJudgment:  GoodInsight:  GoodCognitive EvaluationOrientation: Oriented to person, oriented to place and oriented to date/time Attention and Concentration:  Normal attention and concentrationMemory: Recent and remote memory intactLanguage:  Language intactFund of Knowledge: average.Abstract Reasoning: Normal capacity for abstract reasoningSafety and Risk AssessmentPSY RISK ASSESSMENT SAFE-T WITH C-SSRS C-SSRS: Suicidal Ideation:  Since Last Assessment  WISH TO BE DEAD:  Yes  CURRENT SUICIDAL THOUGHTS:  No  SUICIDAL THOUGHTS WITH METHOD:  No  SUICIDAL INTENT WITHOUT SPECIFIC PLAN:  No  INTENT WITH PLAN: No  Have you ever done anything, started to do anything or prepared to do anything to end your life?:  No  Was it within the past 3 months?:  NoSuicidal Ideation Intensity :   FREQUENCY:  2-5 times a week  DURATION:  Fleeting - few seconds  or minutes  CONTROLLABILITY:  Can control thoughts with little difficulty  DETERRENTS:  Deterrents definitely stopped you from attempting suicide  REASONS FOR IDEATION:  Does not applyRisk Assessment:   Access to Lethal Methods:  NoCurrent and Past Psychiatric Diagnoses:    Mood Disorder:  Recurrent/Current  Psychotic Disorder:  No  Alcohol/Substance Abuse Disorders:  Recurrent/Current  PTSD:  No  ADHD:  No  TBI:  No  Cluster B Personality Disorders or Traits:  No  Conduct Problems:  No  Suicide Attempt:  No Prior Attempts  Presenting Symptoms:  Anhedonia:  Yes  Impulsivity:  No  Hopelessness or Despair:  Yes  Anxiety and/or Panic:  No  Insomnia:  No  Command Hallucinations:  No  Psychosis:  No  Self-injurious Behavior:  No  Physical Aggression Toward Others:  No  Violence, Victimization and/or Perpetration:  No  Family History:   Suicide:  No  Suicidal Behavior:  No  Axis I Psychiatric Diagnosis requiring hospitalization:  No  Precipitants / Stressors:   Triggering events leading to humiliation, shame and/or despair:  No  Chronic physical pain or other acute medical problem:  No  Sexual / Physical Abuse:  No  Substance Intoxication or Withdrawal:  No  Pending Incarceration:  No  Legal Problems:  No  Inadequate Social Supports:  No  Social Isolation:  No  Perceived burden on others:  No  Bullying / Cyberbullying:  No  Media portrayal of suicide:  No  Housing Insecurity:  No  Financial Insecurity:  No  Stressful Life Events:  Yes  Gender / Sexual Identity:  No  Homelessness:  No  Change in Treatment:    Recent inpatient discharge:  No  Change in provider or treatment:  No Hopeless or dissatisfied with provider or treatment:  No  Non-compliant:  Yes  Not receiving treatment:  NoCurrent and Past Psychiatric Diagnoses:  Mood Disorder:  Recurrent/Current  Psychotic Disorder:  No  Alcohol/Substance Abuse Disorders:  Recurrent/Current  PTSD:  No  ADHD:  No  TBI:  No  Cluster B Personality Disorders or Traits:  No  Conduct Problems:  No  Suicide Attempt:  No Prior AttemptsProtective Factors:   Internal:   Ability to cope with stress:  Yes  Frustration tolerance:  Yes  Fear of death or the actual act of killing self:  Yes  Identifies reasons for living:  Yes  Problem solving skills:  Yes  External:   Responsibility to children:  Yes  Beloved pets:  Yes  Supportive social network of friends or family:  Yes  Positive therapeutic relationships / Effective mental healthcare:  YesRisk to Self - Self-Injurious Behavior:   Current Urges to harm Self:  No  Recent Self-Injury:  No  History of Self Injury:  No  Imminent Risk for Self Injury in Community:  Low  Imminent Risk for Self Injury in Facility:  LowRisk to Others:   Current Agitation:  No  Homicidal/Aggressive Ideation:  No  Homicidal/Aggressive Threat/Plan:  No  Recent Violence/Aggression:  NoWithdrawal Risk:   Alcohol / Benzodiazepines / Barbiturates:  Not applicable     Alcohol/Benzodiazepine/Barbiturate Risk for Withdrawal:  Absent  Opioids:  Not applicable     Opioid Risk for Withdrawal:  AbsentMEDICAL DECISION MAKING DiagnosisProblem List         ICD-10-CM   OP/IOP/PHP Psychiatry  MDD (major depressive disorder), recurrent severe, 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 Naveyah reports some improvements with depression but continuing to experience depressed mood with suicidal thoughts (no plan or intent). Will increase lithium to 1450mg  and follow-up next week.   Reviewed safety plan with [name]. She verbalized understanding.  The Self-Report [place] Risk Suicide Severity Rating Scale Since Last Visit was completed by Margarita Grizzle and reviewed by myself today.  Additionally, risk assessment from [facility] Koeller's intake assessment and follow up screenings have been reviewed.  Based on Aleyah B Konicek?s clinical presentation today as well as her self-reported symptom ratings today and over the current course of treatment, I have assessed Margarita Grizzle to be at unchanged risk of harm to self as compared with prior recent assessments.  Based on this assessment, I have continued current treatment plan including implementation of factors to mitigate risks of harm to self as outlined in the last risk assessmentIntervention(s)Supportive therapy Labs/Test/Consults OrderedNone today Medication ChangesIncrease lithium 1350mg  in AMPlanIncrease lithium 1350mg  in AMCogentin cogentin 2mg  twice a dayContinue vraylar 4.5mg  Continue zolpidem 5mg  nightly as neededContinue hydroxyzine 25mg  every four hours as needed for anxiety (encouraged her to use hydroxyzine thorughout the weekend for heightened anxiety) Discussed side effects, pros & cons of treatment, and alternative treatment options. Reviewed safety plan to call the clinic, the National Suicide Prevention Lifeline: 780-012-0267, or  911 to go to the ER if safety is at risk. Not deemed at imminent risk of harm to self/others at this timePatient verbalized understanding and had no additional questions. Electronically Signed:Verdis Koval, NP 08/25/2019 11:11 AM

## 2019-08-25 NOTE — Other
Group Start Time: ??10:00 AM??????End Time: ?11:00 AMFacilitators:??Cindie Crumbly, LCSWVIDEO TELEHEALTH VISIT, PLATFORM OTHER THAN MYCHART: This clinician is conducting this visit at a site within our enrolled clinical location.Marland Kitchen ?For this visit the clinician and patient were present via interactive audio & video telecommunications system that permits real-time communications, via a platform other than MyChart.?Platforms other than MyChart may be used only during the COVID-19 crisis, when MyChart is not an option, and video is required for clinical reasons. ?Reason for choice of video platform other than MyChart: Currently [facility] has identified Zoom as the appropriate telehealth platform to facilitate IOP Group Therapy.?PLEASE NOTE: CONSENT FOR THIS VISIT IS DIFFERENT THAN FOR MYCHART VIDEO VISITS! ?Verbal consent must be obtained by the clinician or staff prior to starting visit.??Read the Following Statements to Video Visit Participants:????(Name of clinician) will be conducting your video visit today. Before we begin, we must obtain your consent for today's visit.Can you confirm your name, date of birth, and state where you (or your child, if applicable) are currently located?By consenting, you understand that today's visit will be conducted through video conferencing technology other than our usual secure service. While we do everything possible to ensure your privacy, we may not be able to guarantee the security of this particular platform. You understand that the visit will not be recorded, and that we will be documenting this visit in our electronic medical record, and that we may disclose records concerning this interaction to other clinicians. You understand that a video visit may limit our ability to diagnose your condition.??You understand that if you are experiencing a medical emergency, you will be directed to dial 9-1-1 as we are not able to connect you directly. You also understand if your condition changes after this visit and you need of further care, you will contact our office. Do you agree to this consent, knowing that you can change your decision at any time? Do you have any other questions before we proceed???Patient consent given for video visit: yes?State patient is located in: [place] At home?LocationThe clinician is appropriately licensed in the above state to provide care for this visit.??Session Focus? Connection between feelings, thoughts and behaviorsDevelop skills to manage issues and improve functioningStatus of symptoms / issuesTreatment goal and skill useDaily goal setting Number of Participants? 5 Treatment Modality? Group Therapy Interventions/Education? Assisted patient addressing stressful life situationsAssisted patient with discharge planningAssisted patient with strategies for addressing peer issuesAssisted patient with strategies for managing illness/symptomsAssisted patient with terminationEncouraged increased cooperation with othersEncouraged increased initiation in groupsEncouraged patient to increase treatment focusEncouraged patient to verbalize, rather than enactEncourages patient to provide constructive feedback to othersAssisted patient with strategies for addressing family issuesAssisted patient with strategies for addressing school issuesEncouraged acceptance, tolerance and respect for othersEncouraged appropriate expression of thoughts and feelingsEncouraged improved listening skillsEncouraged patient to request as-needed help solving problemsHelped decrease isolation/increase sense of belongingHelped identify feelings, thoughts and behaviors connectionHelped increase awareness of feelings, thoughts and behaviorsHelped patient acquire skills to build self-confidenceHelped patient identify strengthsHelped patient increase ability to gain support from othersHelped patient increase comfort with othersHelped patient increase interpersonal effectivenessHelped patient increase sense of acceptance by othersProvided assistance problem-solving challengesProvided positive reinforcement for successesProvided reinforcement for patient contributions to othersProvided reinforcement for use of adaptive coping skills ?Attendance???????????? Partial attendance due to patient arriving late and then immediately seeing APRN.  Total minutes attended: 9Engagement InterventionsNot applicable - patient attended group without difficulty Degree of Participation? Active Behavior Alert, Cooperative, and Interactive Speech/Thought Process Coherent, Directed and Relevant Affect/Mood Anxious, Depressed  Insight Moderate Judgment Moderate Response to Interventions? Attentive, Engaged, Interested and Receptive Individualization? SUMMARY NOTE Group 1:During IOP Group Therapy via ZOOM Patient reported that she was having difficulty getting into the room with her device to attend IOP treatment patient came and about 30 minutes late to IOP treatment and the APRN who she was scheduled to see was available and this writer transition to her into a breakout room.  Patient was unable to complete check-in but did complete an assessment with the APRN please see note including her [place] suicide rating scale. Plan? Continue to assist patient with skills to improve functionContinue to assist patient with skills to manage symptomsContinue to reinforce use of positive coping skillsContinue to review and assess symptoms / issuesContinue to review and assess treatment goals / skill useContinue per Plan of Care / Interdisciplinary Plan of Care ?  [place] - Suicide Severity Screen - completed by APRN please see note Cindie Crumbly, LCSW6/16/202111:[id number] AM

## 2019-08-26 ENCOUNTER — Inpatient Hospital Stay: Admit: 2019-08-26 | Discharge: 2019-08-26 | Payer: MEDICAID | Primary: Cardiovascular Disease

## 2019-08-26 DIAGNOSIS — F411 Generalized anxiety disorder: Secondary | ICD-10-CM

## 2019-08-26 DIAGNOSIS — F3189 Other bipolar disorder: Secondary | ICD-10-CM

## 2019-08-26 DIAGNOSIS — F332 Major depressive disorder, recurrent severe without psychotic features: Secondary | ICD-10-CM

## 2019-08-26 DIAGNOSIS — F1210 Cannabis abuse, uncomplicated: Secondary | ICD-10-CM

## 2019-08-26 NOTE — Other
Group Start Time: ??10:00 AM??????End Time: ?11:00 AMFacilitators:??Cindie Crumbly, LCSWVIDEO TELEHEALTH VISIT, PLATFORM OTHER THAN MYCHART: This clinician is conducting this visit at a site within our enrolled clinical location.Marland Kitchen ?For this visit the clinician and patient were present via interactive audio & video telecommunications system that permits real-time communications, via a platform other than MyChart.?Platforms other than MyChart may be used only during the COVID-19 crisis, when MyChart is not an option, and video is required for clinical reasons. ?Reason for choice of video platform other than MyChart: Currently [facility] has identified Zoom as the appropriate telehealth platform to facilitate IOP Group Therapy.?PLEASE NOTE: CONSENT FOR THIS VISIT IS DIFFERENT THAN FOR MYCHART VIDEO VISITS! ?Verbal consent must be obtained by the clinician or staff prior to starting visit.??Read the Following Statements to Video Visit Participants:????(Name of clinician) will be conducting your video visit today. Before we begin, we must obtain your consent for today's visit.Can you confirm your name, date of birth, and state where you (or your child, if applicable) are currently located?By consenting, you understand that today's visit will be conducted through video conferencing technology other than our usual secure service. While we do everything possible to ensure your privacy, we may not be able to guarantee the security of this particular platform. You understand that the visit will not be recorded, and that we will be documenting this visit in our electronic medical record, and that we may disclose records concerning this interaction to other clinicians. You understand that a video visit may limit our ability to diagnose your condition.??You understand that if you are experiencing a medical emergency, you will be directed to dial 9-1-1 as we are not able to connect you directly. You also understand if your condition changes after this visit and you need of further care, you will contact our office. Do you agree to this consent, knowing that you can change your decision at any time? Do you have any other questions before we proceed???Patient consent given for video visit: yes?State patient is located in: [place] At home?LocationThe clinician is appropriately licensed in the above state to provide care for this visit.??Session Focus? Connection between feelings, thoughts and behaviorsDevelop skills to manage issues and improve functioningStatus of symptoms / issuesTreatment goal and skill useDaily goal setting Number of Participants? 6 Treatment Modality? Group Therapy Interventions/Education? Assisted patient addressing stressful life situationsAssisted patient with discharge planningAssisted patient with strategies for addressing peer issuesAssisted patient with strategies for managing illness/symptomsAssisted patient with terminationEncouraged increased cooperation with othersEncouraged increased initiation in groupsEncouraged patient to increase treatment focusEncouraged patient to verbalize, rather than enactEncourages patient to provide constructive feedback to othersAssisted patient with strategies for addressing family issuesAssisted patient with strategies for addressing school issuesEncouraged acceptance, tolerance and respect for othersEncouraged appropriate expression of thoughts and feelingsEncouraged improved listening skillsEncouraged patient to request as-needed help solving problemsHelped decrease isolation/increase sense of belongingHelped identify feelings, thoughts and behaviors connectionHelped increase awareness of feelings, thoughts and behaviorsHelped patient acquire skills to build self-confidenceHelped patient identify strengthsHelped patient increase ability to gain support from othersHelped patient increase comfort with othersHelped patient increase interpersonal effectivenessHelped patient increase sense of acceptance by othersProvided assistance problem-solving challengesProvided positive reinforcement for successesProvided reinforcement for patient contributions to othersProvided reinforcement for use of adaptive coping skills ?Attendance???????????? AttendedEngagement InterventionsNot applicable - patient attended group without difficulty Degree of Participation? Active Behavior Alert, Cooperative, and Interactive Speech/Thought Process Coherent, Directed and Relevant Affect/Mood Anxious, Depressed Insight Moderate Judgment Moderate Response to Interventions? Attentive, Engaged, Interested and Receptive Individualization? SUMMARY NOTE Group  1:During IOP Group Therapy via ZOOM Patient self-reported on a scale of 0 = None to 10= Highest; patient described their most concerning symptomatology as; depression 6, irritability 4, rumination 6, anxiety 5, drug cravings 3 and 0 for all other assessed symptomatology.  Patient report was congruent with the patient's observed presentation.  Patient reports goal for the week as:  Partially met as she did go to the gym 1 time but is having difficulty eating healthy and eating at all.  Patient reports she has no appetite and reports of told the provider was given encouragement to force herself to eat.  Patient identifies things that went well/grateful for; my son his health, my mom watching him psych can do other things, I want to go out more.  Patient identified purposely working on the following skill; trying to get out and be active, going to the gym, and eat.  Patient identified that she does drink Ensure regularly which is provided to her by someone else.  Patient reports to be taking their medication as prescribed and reports no concerns noted at this time. Patient reports currently to not be experiencing any ideation, intent, or plans in reference to suicidal or homicidal symptomatology. Plan? Continue to assist patient with skills to improve functionContinue to assist patient with skills to manage symptomsContinue to reinforce use of positive coping skillsContinue to review and assess symptoms / issuesContinue to review and assess treatment goals / skill useContinue per Plan of Care / Interdisciplinary Plan of Care ??C-SSRS Since Last Visit  [place] - Suicide Severity Screen? ? Most Recent Value Have you wished you were dead or wished you could go to sleep and not wake up? ?No Have you actually had any thoughts of killing yourself?  ?No Have you been thinking about how you might kill yourself?  ?No [place] Suicide Risk Level ?Low Risk ??Cindie Crumbly, LCSW6/17/20212:23 PM

## 2019-08-26 NOTE — Group Note
Group Session:  Group TherapyGroup Start Time:   11:10 AM      End Time:  12:00 PMFacilitators:  [name], LCSW; [place], [place], PCT VIDEO TELEHEALTH VISIT, PLATFORM OTHER THAN MYCHART: This clinician is conducting this visit at a site within our enrolled clinical location..  For this visit the clinician and patient were present via interactive audio & video telecommunications system that permits real-time communications, via a platform other than MyChart.Platforms other than MyChart may be used only during the COVID-19 crisis, when MyChart is not an option, and video is required for clinical reasons. Reason for choice of video platform other than MyChart: Currently [facility] has identified Zoom as the appropriate telehealth platform to facilitate IOP Group Therapy.PLEASE NOTE: CONSENT FOR THIS VISIT IS DIFFERENT THAN FOR MYCHART VIDEO VISITS! Verbal consent must be obtained by the clinician or staff prior to starting visit.  Read the Following Statements to Video Visit Participants:  ?(Name of clinician) will be conducting your video visit today. Before we begin, we must obtain your consent for today's visit.Can you confirm your name, date of birth, and state where you (or your child, if applicable) are currently located?By consenting, you understand that today's visit will be conducted through video conferencing technology other than our usual secure service. While we do everything possible to ensure your privacy, we may not be able to guarantee the security of this particular platform. You understand that the visit will not be recorded, and that we will be documenting this visit in our electronic medical record, and that we may disclose records concerning this interaction to other clinicians. You understand that a video visit may limit our ability to diagnose your condition.  You understand that if you are experiencing a medical emergency, you will be directed to dial 9-1-1 as we are not able to connect you directly. You also understand if your condition changes after this visit and you need of further care, you will contact our office. Do you agree to this consent, knowing that you can change your decision at any time? Do you have any other questions before we proceed??Patient consent given for video visit: yesState patient is located in: [place] [facility] clinician is appropriately licensed in the above state to provide care for this visit.Other individuals present during the telehealth encounter and their role/relation: Identified group facilitator(s), support staff as needed, and fellow group participants.Session Focus Connection between feelings, thoughts and behaviorsDevelop skills to manage issues and improve functioningStatus of symptoms / issuesTreatment goal and skill useDaily goal setting Number of Participants 6 Treatment Modality Group Therapy Interventions/Education Assisted patient addressing stressful life situationsAssisted patient with strategies for addressing peer issuesAssisted patient with strategies for managing illness/symptomsEncouraged increased cooperation with othersEncouraged increased initiation in groupsEncouraged patient to increase treatment focusEncouraged patient to verbalize, rather than enactEncourages patient to provide constructive feedback to othersAssisted patient with strategies for addressing family issuesAssisted patient with strategies for addressing school issuesEncouraged acceptance, tolerance and respect for othersEncouraged appropriate expression of thoughts and feelingsEncouraged improved listening skillsEncouraged patient to request as-needed help solving problemsHelped decrease isolation/increase sense of belongingHelped identify feelings, thoughts and behaviors connectionHelped increase awareness of feelings, thoughts and behaviorsHelped patient acquire skills to build self-confidenceHelped patient identify strengthsHelped patient increase ability to gain support from othersHelped patient increase comfort with othersHelped patient increase interpersonal effectivenessHelped patient increase sense of acceptance by othersProvided assistance problem-solving challengesProvided positive reinforcement for successesProvided reinforcement for patient contributions to othersProvided reinforcement for use of adaptive coping skills Attendance  AttendedEngagement InterventionsNot applicable - patient attended group without difficulty Degree of Participation Active Behavior Alert, Calm, Cooperative, Interactive and Oriented Speech/Thought Process Coherent, Focused and Relevant Affect/Mood Anxious, Euthymic and Full range Insight Moderate Judgment Moderate Response to Interventions Attentive, Engaged, Interested and Receptive Individualization Summary Note Group 2During Zoom group therapy patient actively participated in the group discussion. The focus of today?s group discussion was reviewing the worksheet ?Thoughts, Feelings, Actions?.  Discussed how a thought, leads to a feeling that leads to an action. For example: ?I?m going to fail the test.?  leads to the feeling of being worried and anxious which leads to the action of not studying. Patient agreed the thought to I will pass will lead to feeling more confident and will pass the test. Patient shared an example of negative thought pattern she has experience. When she makes a mistake she stated her negative thought is I hate myself,  which makes her feel sad and angry and leads to not moving forward with things. Patient stated she can change the thought to I appreciate myself or love myself can lead to feeling more positive and doing more things.  Patient reported not experiencing any current suicidal or homicidal thought, plan or intent. Plan Continue to assist patient with skills to improve functionContinue to assist patient with skills to manage symptomsContinue to reinforce use of positive coping skillsContinue to review and assess symptoms / issuesContinue to review and assess treatment goals / skill useContinue per Plan of Care / Interdisciplinary Plan of Care [name], LCSW6/17/202112:26 PM

## 2019-08-26 NOTE — Other
Group Session: ?Group Therapy?Group Start Time: ??12:10 PM??????End Time: ?1:00 PMFacilitators:  Cindie Crumbly, LCSWVIDEO TELEHEALTH VISIT, PLATFORM OTHER THAN MYCHART: This clinician is conducting this visit at a site within our enrolled clinical location.Marland Kitchen ?For this visit the clinician and patient were present via interactive audio & video telecommunications system that permits real-time communications, via a platform other than MyChart.?Platforms other than MyChart may be used only during the COVID-19 crisis, when MyChart is not an option, and video is required for clinical reasons. ?Reason for choice of video platform other than MyChart: Currently [facility] has identified Zoom as the appropriate telehealth platform to facilitate IOP Group Therapy.?PLEASE NOTE: CONSENT FOR THIS VISIT IS DIFFERENT THAN FOR MYCHART VIDEO VISITS! ?Verbal consent must be obtained by the clinician or staff prior to starting visit.??Read the Following Statements to Video Visit Participants:????(Name of clinician) will be conducting your video visit today. Before we begin, we must obtain your consent for today's visit.Can you confirm your name, date of birth, and state where you (or your child, if applicable) are currently located?By consenting, you understand that today's visit will be conducted through video conferencing technology other than our usual secure service. While we do everything possible to ensure your privacy, we may not be able to guarantee the security of this particular platform. You understand that the visit will not be recorded, and that we will be documenting this visit in our electronic medical record, and that we may disclose records concerning this interaction to other clinicians. You understand that a video visit may limit our ability to diagnose your condition.??You understand that if you are experiencing a medical emergency, you will be directed to dial 9-1-1 as we are not able to connect you directly. You also understand if your condition changes after this visit and you need of further care, you will contact our office. Do you agree to this consent, knowing that you can change your decision at any time? Do you have any other questions before we proceed???Patient consent given for video visit: yes?State patient is located in: [place] At home?LocationThe clinician is appropriately licensed in the above state to provide care for this visit.??Session Focus? Connection between feelings, thoughts and behaviorsDevelop skills to manage issues and improve functioningStatus of symptoms / issuesTreatment goal and skill useDaily goal setting Number of Participants? 6 Treatment Modality? Group Therapy Interventions/Education? Assisted patient addressing stressful life situationsAssisted patient with discharge planningAssisted patient with strategies for addressing peer issuesAssisted patient with strategies for managing illness/symptomsAssisted patient with terminationEncouraged increased cooperation with othersEncouraged increased initiation in groupsEncouraged patient to increase treatment focusEncouraged patient to verbalize, rather than enactEncourages patient to provide constructive feedback to othersAssisted patient with strategies for addressing family issuesAssisted patient with strategies for addressing school issuesEncouraged acceptance, tolerance and respect for othersEncouraged appropriate expression of thoughts and feelingsEncouraged improved listening skillsEncouraged patient to request as-needed help solving problemsHelped decrease isolation/increase sense of belongingHelped identify feelings, thoughts and behaviors connectionHelped increase awareness of feelings, thoughts and behaviorsHelped patient acquire skills to build self-confidenceHelped patient identify strengthsHelped patient increase ability to gain support from othersHelped patient increase comfort with othersHelped patient increase interpersonal effectivenessHelped patient increase sense of acceptance by othersProvided assistance problem-solving challengesProvided positive reinforcement for successesProvided reinforcement for patient contributions to othersProvided reinforcement for use of adaptive coping skills ?Attendance???????????? AttendedEngagement InterventionsNot applicable - patient attended group without difficulty Degree of Participation? Active Behavior Alert, Cooperative, and Interactive Speech/Thought Process Coherent, Directed and Relevant Affect/Mood Anxious, Full Range Insight Moderate Judgment Moderate Response to Interventions? Attentive, Engaged, Interested  and Receptive Individualization? SUMMARY NOTE Group 3:During IOP Group Therapy via ZOOM Patients discussed weekend planning.  Clinician advised patient's when, how to use a safety plan, and individualized their safety plans.  Patient prompted to discuss any anticipated conflicts or concerns they had about their weekend and prompted patient is to give and receive feedback on how they may work on resolving or staying safe in dealing with them highlighting utilizing coping skills learned in IOP.  Specifically this patient reported she has some plans with her son going to park that she really wants to for herself go to the gym.  Patient shared that she has anxiety around going to the gym and people looking at her.  The group spoke about pros and cons as well as the hopefulness of the gym and help patient to look at challenging her anxiety using skills taught in group.  Patient also reports she is working out things with her boyfriend and the group cautioned her on this. Patient reports currently to not be experiencing any ideation, intent, or plans in reference to suicidal or homicidal symptomatology. Plan? Continue to assist patient with skills to improve functionContinue to assist patient with skills to manage symptomsContinue to reinforce use of positive coping skillsContinue to review and assess symptoms / issuesContinue to review and assess treatment goals / skill useContinue per Plan of Care / Interdisciplinary Plan of Care ?Cindie Crumbly, LCSW6/17/20212:26 PM

## 2019-08-30 ENCOUNTER — Inpatient Hospital Stay: Admit: 2019-08-30 | Discharge: 2019-08-30 | Payer: MEDICAID | Primary: Cardiovascular Disease

## 2019-08-30 DIAGNOSIS — F411 Generalized anxiety disorder: Secondary | ICD-10-CM

## 2019-08-30 DIAGNOSIS — F1210 Cannabis abuse, uncomplicated: Secondary | ICD-10-CM

## 2019-08-30 DIAGNOSIS — F3189 Other bipolar disorder: Secondary | ICD-10-CM

## 2019-08-30 NOTE — Group Note
Group Session 3:  Group TherapyGroup Start Time:   12:10 PM      End Time:   1:00 PMFacilitators:  Renu Asby, LCSWVIDEO TELEHEALTH VISIT, PLATFORM OTHER THAN MYCHART: This clinician is conducting this visit at a site within our enrolled clinical location..  For this visit the clinician and patient were present via interactive audio & video telecommunications system that permits real-time communications, via a platform other than MyChart.Platforms other than MyChart may be used only during the COVID-19 crisis, when MyChart is not an option, and video is required for clinical reasons. Reason for choice of video platform other than MyChart: Currently [facility] has identified Zoom as the appropriate telehealth platform to facilitate IOP Group Therapy.PLEASE NOTE: CONSENT FOR THIS VISIT IS DIFFERENT THAN FOR MYCHART VIDEO VISITS! Verbal consent must be obtained by the clinician or staff prior to starting visit.  Read the Following Statements to Video Visit Participants:  ?(Name of clinician) will be conducting your video visit today. Before we begin, we must obtain your consent for today's visit.Can you confirm your name, date of birth, and state where you (or your child, if applicable) are currently located?By consenting, you understand that today's visit will be conducted through video conferencing technology other than our usual secure service. While we do everything possible to ensure your privacy, we may not be able to guarantee the security of this particular platform. You understand that the visit will not be recorded, and that we will be documenting this visit in our electronic medical record, and that we may disclose records concerning this interaction to other clinicians. You understand that a video visit may limit our ability to diagnose your condition.  You understand that if you are experiencing a medical emergency, you will be directed to dial 9-1-1 as we are not able to connect you directly. You also understand if your condition changes after this visit and you need of further care, you will contact our office. Do you agree to this consent, knowing that you can change your decision at any time? Do you have any other questions before we proceed??Patient consent given for video visit: yesState patient is located in: [place]: at patient's home addressThe clinician is appropriately licensed in the above state to provide care for this visit.Other individuals present during the telehealth encounter and their role/relation: Identified group facilitator(s), support staff as needed, and fellow group participants.Session Focus Connection between feelings, thoughts and behaviorsDevelop skills to manage issues and improve functioningStatus of symptoms / issuesTreatment goal and skill useDaily goal setting Number of Participants 3 Treatment Modality Group Therapy and Psycho-education Interventions/Education Assisted patient addressing stressful life situationsAssisted patient with strategies for managing illness/symptomsEncouraged increased initiation in groupsEncourages patient to provide constructive feedback to othersHelped decrease isolation/increase sense of belongingHelped identify feelings, thoughts and behaviors connectionHelped increase awareness of feelings, thoughts and behaviorsHelped patient acquire skills to build self-confidenceHelped patient identify strengthsHelped patient increase ability to gain support from othersHelped patient increase comfort with othersHelped patient increase interpersonal effectivenessHelped patient increase sense of acceptance by othersProvided assistance problem-solving challenges Attendance            AttendedEngagement InterventionsNot applicable - patient attended group without difficulty Degree of Participation Active Behavior Cooperative, Interactive and Oriented Speech/Thought Process Coherent, Focused and Organized Affect/Mood Euthymic and Stable Insight Limited Judgment Good Response to Interventions Attentive, Engaged and Interested Individualization Today?s third group was an activity called, Embracing Critical Reflection, and it was conducted virtually through a [employer].  Patients were asked to consider how they  could be more thoughtful in their actions rather than reactive to stimulus or stress.  To this end, each were asked to identify 1)something they can start doing that will help them improve their mental health, 2) something they can do more of, 3) something they can stop doing that will help them and 4) something they can do less of that will support their improved mental health.Patient said she want to start going out more.  She also wants to eat more.  Patient said she wants to stop overthinking things.  She also wants to stop refusing to eat.  Patient said she wants to get more energy and become stronger emotionally.  Patient said she also wants to go to the gym more and make excuses for not exercising less. Patient did not express any suicidal or homicidal thoughts, plan or intent during this group. Plan Continue to assist patient with skills to improve functionContinue to assist patient with skills to manage symptomsContinue to reinforce use of positive coping skillsContinue to review and assess symptoms / issuesContinue to review and assess treatment goals / skill useContinue per Plan of Care / Interdisciplinary Plan of Care Zoe Scott, LCSW6/21/20212:03 PM

## 2019-08-30 NOTE — Telephone Encounter
Clinician called patient to inquire about her not being group patient reported ?I do not Wanna come back to group I am still angry had a feel like the group is helping me.  Patient reports she believes she needs time type of anger management treatment program.  Clinician reported that he only had a few minutes in between group to inquire about her attendance that we would talk to her prior to the end of the treatment day about attending and/or follow-up plans.Cindie Crumbly, LCSW6/21/20212:11 PM

## 2019-08-30 NOTE — Other
Group Session:  Group Therapy Group Start Time:   11:10 AM      End Time:  12:00 PMFacilitators:  Cindie Crumbly, LCSW; [place], [place], PCTVIDEO TELEHEALTH VISIT, PLATFORM OTHER THAN MYCHART: This clinician is conducting this visit at a site within our enrolled clinical location.Marland Kitchen ?For this visit the clinician and patient were present via interactive audio & video telecommunications system that permits real-time communications, via a platform other than MyChart.?Platforms other than MyChart may be used only during the COVID-19 crisis, when MyChart is not an option, and video is required for clinical reasons. ?Reason for choice of video platform other than MyChart: Currently [facility] has identified Zoom as the appropriate telehealth platform to facilitate IOP Group Therapy.?PLEASE NOTE: CONSENT FOR THIS VISIT IS DIFFERENT THAN FOR MYCHART VIDEO VISITS! ?Verbal consent must be obtained by the clinician or staff prior to starting visit.??Read the Following Statements to Video Visit Participants:????(Name of clinician) will be conducting your video visit today. Before we begin, we must obtain your consent for today's visit.Can you confirm your name, date of birth, and state where you (or your child, if applicable) are currently located?By consenting, you understand that today's visit will be conducted through video conferencing technology other than our usual secure service. While we do everything possible to ensure your privacy, we may not be able to guarantee the security of this particular platform. You understand that the visit will not be recorded, and that we will be documenting this visit in our electronic medical record, and that we may disclose records concerning this interaction to other clinicians. You understand that a video visit may limit our ability to diagnose your condition.??You understand that if you are experiencing a medical emergency, you will be directed to dial 9-1-1 as we are not able to connect you directly. You also understand if your condition changes after this visit and you need of further care, you will contact our office. Do you agree to this consent, knowing that you can change your decision at any time? Do you have any other questions before we proceed???Patient consent given for video visit: yes?State patient is located in: [place] At home?LocationThe clinician is appropriately licensed in the above state to provide care for this visit.??Session Focus? Connection between feelings, thoughts and behaviorsDevelop skills to manage issues and improve functioningStatus of symptoms / issuesTreatment goal and skill useDaily goal setting Number of Participants? 4 Treatment Modality? Group Therapy Interventions/Education? Assisted patient addressing stressful life situationsAssisted patient with discharge planningAssisted patient with strategies for addressing peer issuesAssisted patient with strategies for managing illness/symptomsAssisted patient with terminationEncouraged increased cooperation with othersEncouraged increased initiation in groupsEncouraged patient to increase treatment focusEncouraged patient to verbalize, rather than enactEncourages patient to provide constructive feedback to othersAssisted patient with strategies for addressing family issuesAssisted patient with strategies for addressing school issuesEncouraged acceptance, tolerance and respect for othersEncouraged appropriate expression of thoughts and feelingsEncouraged improved listening skillsEncouraged patient to request as-needed help solving problemsHelped decrease isolation/increase sense of belongingHelped identify feelings, thoughts and behaviors connectionHelped increase awareness of feelings, thoughts and behaviorsHelped patient acquire skills to build self-confidenceHelped patient identify strengthsHelped patient increase ability to gain support from othersHelped patient increase comfort with othersHelped patient increase interpersonal effectivenessHelped patient increase sense of acceptance by othersProvided assistance problem-solving challengesProvided positive reinforcement for successesProvided reinforcement for patient contributions to othersProvided reinforcement for use of adaptive coping skills ?Attendance???????????? Partial attendance due to patient arriving late.  Total minutes attended 45Engagement InterventionsNot applicable - patient attended group without difficulty Degree of Participation? Active  Behavior Alert, Cooperative, and Interactive Speech/Thought Process Coherent, Directed and Relevant Affect/Mood Anxious, Depressed Insight Moderate Judgment Moderate Response to Interventions? Attentive, Engaged, Interested and Receptive Individualization? SUMMARY NOTE Group 2:During IOP Group Therapy via ZOOM Patients or presented information on ?tips for avoiding relapse?.  Clinician generalized relapse to be recurrence of all thinking, behaviors, or actions leading substance use.  Specifically this patient identified patient brief check-in during the break between 2nd and 3rd group as she was late to treatment today after initially refusing to attend.  No concerns noted in relation to check-in.  Specifically this group patient shared that she is scared to go out in public at ?get Ready to go out I just sit the car and of like know I can do this feeling my anxiety takes over.  Patient also reports that her anger continues to disrupt her thinking she has difficulty with managing it.  Relation to relapse patient reports that she has tried to use less cannabis however and anxiety producing situation she continues to use cannabis with the thought that will benefit her and help her better manage her symptoms which is contrary to what is actually occurring. Patient had difficulty seeing this for what it was rather feels that cannabis or not she still would be challenged by her anxiety and anger. Patient reports currently to not be experiencing any ideation, intent, or plans in reference to suicidal or homicidal symptomatology. Plan? Continue to assist patient with skills to improve functionContinue to assist patient with skills to manage symptomsContinue to reinforce use of positive coping skillsContinue to review and assess symptoms / issuesContinue to review and assess treatment goals / skill useContinue per Plan of Care / Interdisciplinary Plan of Care ??C-SSRS Since Last Visit? ? [place] - Suicide Severity Screen? ? Most Recent Value Have you wished you were dead or wished you could go to sleep and not wake up? ?No Have you actually had any thoughts of killing yourself?  ?No Have you been thinking about how you might kill yourself?  ?No [place] Suicide Risk Level ?Low Risk ??Cindie Crumbly, LCSW6/21/20214:12 PM

## 2019-08-30 NOTE — Other
Group Start Time: ??12:10 PM      End Time:  1:00 PMFacilitators:??Cindie Crumbly, LCSWVIDEO TELEHEALTH VISIT, PLATFORM OTHER THAN MYCHART: This clinician is conducting this visit at a site within our enrolled clinical location.Marland Kitchen ?For this visit the clinician and patient were present via interactive audio & video telecommunications system that permits real-time communications, via a platform other than MyChart.?Platforms other than MyChart may be used only during the COVID-19 crisis, when MyChart is not an option, and video is required for clinical reasons. ?Reason for choice of video platform other than MyChart: Currently [facility] has identified Zoom as the appropriate telehealth platform to facilitate IOP Group Therapy.?PLEASE NOTE: CONSENT FOR THIS VISIT IS DIFFERENT THAN FOR MYCHART VIDEO VISITS! ?Verbal consent must be obtained by the clinician or staff prior to starting visit.??Read the Following Statements to Video Visit Participants:????(Name of clinician) will be conducting your video visit today. Before we begin, we must obtain your consent for today's visit.Can you confirm your name, date of birth, and state where you (or your child, if applicable) are currently located?By consenting, you understand that today's visit will be conducted through video conferencing technology other than our usual secure service. While we do everything possible to ensure your privacy, we may not be able to guarantee the security of this particular platform. You understand that the visit will not be recorded, and that we will be documenting this visit in our electronic medical record, and that we may disclose records concerning this interaction to other clinicians. You understand that a video visit may limit our ability to diagnose your condition.??You understand that if you are experiencing a medical emergency, you will be directed to dial 9-1-1 as we are not able to connect you directly. You also understand if your condition changes after this visit and you need of further care, you will contact our office. Do you agree to this consent, knowing that you can change your decision at any time? Do you have any other questions before we proceed???Patient consent given for video visit: yes?State patient is located in: [place] At home?LocationThe clinician is appropriately licensed in the above state to provide care for this visit.??Session Focus? Connection between feelings, thoughts and behaviorsDevelop skills to manage issues and improve functioningStatus of symptoms / issuesTreatment goal and skill useDaily goal setting Number of Participants? 6 Treatment Modality? Group Therapy Interventions/Education? Assisted patient addressing stressful life situationsAssisted patient with discharge planningAssisted patient with strategies for addressing peer issuesAssisted patient with strategies for managing illness/symptomsAssisted patient with terminationEncouraged increased cooperation with othersEncouraged increased initiation in groupsEncouraged patient to increase treatment focusEncouraged patient to verbalize, rather than enactEncourages patient to provide constructive feedback to othersAssisted patient with strategies for addressing family issuesAssisted patient with strategies for addressing school issuesEncouraged acceptance, tolerance and respect for othersEncouraged appropriate expression of thoughts and feelingsEncouraged improved listening skillsEncouraged patient to request as-needed help solving problemsHelped decrease isolation/increase sense of belongingHelped identify feelings, thoughts and behaviors connectionHelped increase awareness of feelings, thoughts and behaviorsHelped patient acquire skills to build self-confidenceHelped patient identify strengthsHelped patient increase ability to gain support from othersHelped patient increase comfort with othersHelped patient increase interpersonal effectivenessHelped patient increase sense of acceptance by othersProvided assistance problem-solving challengesProvided positive reinforcement for successesProvided reinforcement for patient contributions to othersProvided reinforcement for use of adaptive coping skills ?Attendance???????????? AttendedEngagement InterventionsNot applicable - patient attended group without difficulty Degree of Participation? Active Behavior Alert, Cooperative, and Interactive Speech/Thought Process Coherent, Directed and Relevant Affect/Mood Anxious, Depressed and Irritable Insight Moderate Judgment Moderate Response to Interventions?  Attentive, Engaged, Interested and Receptive Individualization? SUMMARY NOTE Group 3:During IOP Group Therapy via ZOOM Patients discussed weekend planning.  Clinician advised patient's when, how to use a safety plan, and individualized their safety plans.  Patient prompted to discuss any anticipated conflicts or concerns they had about their weekend and prompted patient is to give and receive feedback on how they may work on resolving or staying safe in dealing with them highlighting utilizing coping skills learned in IOP.  Specifically this patient reported healthy activities for the weekend including working 10 hours on Saturday, going either to the park or query in with her son based on the weather.  In addition clinician did activity coping skills A to Z. patient reported their top to coping skills were ?deep breathing, and leaving the situation?.  Patient also disclosed that she did not share in 1st group but that she saw on Facebook her child's father has had a baby with another individual and she felt ?broken by this?.  Clinician discussed and other patients provided support and reference to what patient is doing at this time in her life juggling all her activities and child and doing well at maintaining them.  Patient was receptive to support provided.  Patient reports currently to not be experiencing any ideation, intent, or plans in reference to suicidal or homicidal symptomatology.? Plan? Continue to assist patient with skills to improve functionContinue to assist patient with skills to manage symptomsContinue to reinforce use of positive coping skillsContinue to review and assess symptoms / issuesContinue to review and assess treatment goals / skill useContinue per Plan of Care / Interdisciplinary Plan of Care ?Cindie Crumbly, LCSW6/3/20215:15 PM

## 2019-08-30 NOTE — Telephone Encounter
Clinician received a voice mail message from a Meka Wilson 08/27/2019 from [address] [facility] a [employer].  Phone [phone].  Clinician returned the call this morning and staff a picked up reported that there was no one there by that name and could not provide this writer any clarifying information as to why they may have called.  This Clinical research associate will follow up with RN and APRN patient may have attempted to get her toxicology/blood work done and was unable to due to paperwork not being there; as this is a common occurrence for patient's utilizing [facility].Cindie Crumbly, LCSW6/21/20218:25 AM

## 2019-08-30 NOTE — Telephone Encounter
Writer called Michiel Cowboy LPN ([phone]). No answer.Writer left a message that Ervin Knack APRN approved to decrease visiting nurse visit form daily x7 days to daily Monday through Friday with a pre-pour medications for Saturday and Sunday.  Pt to self administer medications on Saturdays and Sundays. Writer instructed pt to Regulatory affairs officer back at [employer]. Then Clinical research associate called pt to inform her of above order change., Awaiting return call from LPN and PT. Vance Gather, RN6/21/20211:34 PM

## 2019-08-30 NOTE — Telephone Encounter
At approximately 11:05 a.m. it came to this clinical coordinators attention patient was was a no-show to her scheduled IOP treatment today at 10:00 a.m.Marland Kitchen  Patient's primary clinician  Shi, LCSW notified this writer that he contacted patient and she verbalized desire to no longer engage in the IOP group treatment.  David brown, LCSW needed to go and facilitate group therefore this clinical coordinator called patient back to discuss her concerns.  This Clinical research associate had a 10 minutes conversation with patient via telephone at which time she was come, cooperative and respectful as she verbalized, ?I have already been here 6 weeks and I still do not feel better.?  Patient was able to process her thoughts and feelings while this writer provided support and validated patient's thoughts and feelings.  Patient did acknowledge that she realizes if she ends treatment she will not have any providers to continue her medication management nor therapy.  Ultimately patient stated that she would like to remain in IOP treatment for continued medication titration to therapeutic doses in an effort to improve mood stabilization.  Patient requested a decrease in visiting nurse services from daily to every Monday through Friday with a pre-pour for the weekends.  This Clinical research associate consulted with provider Jess DiNisco, APRN who agreed to planned to decrease visiting nurse services from 7 days weekly to 5 days weekly Monday through Friday with a pre-pour of medication for the weekend Saturday and Sundays. REACH nurse Vance Gather, RN was made aware and will contact the Visiting Nurse Services with he new order. Plan for Provider Jess DiNisco, APRN to meet 1:1 with patient during IOP on Wednesday 09/01/19 for a medication management follow-up. Patient to continue in IOP level of care, x3 days weekly for 3 hours each day for continue group therapy and medication management to reach goal of mood stabilization. Following conversation with patient she was able to smoothly transition in to IOP group at approximately 11:15 p.m.Marland Kitchen  Please see IOP group notes for details.F/U as needed.Solmon Ice, LCSW6/21/20216:23 PM

## 2019-08-31 NOTE — Telephone Encounter
Writer called Zoe Scott and clarified lithium dose. Zoe Scott reported that she started the increase today after Clinical research associate spoke with visiting nurse. Writer instructed Zoe Scott to wait 5 days before obtaining a lithium level and Zoe Scott will have a scheduled appt with provider next week unless an urgent matter arises. Writer informed Zoe Scott that Clinical research associate will relay above to [employer] via epic inbox and ask APRN to order next lithium level in 5 days. Zoe Scott acknowledged understanding and verbalized that she hopes the increase will improve her mood. Zoe Scott denied any safety issues at this time. Vance Gather, RN6/22/20211:06 PM

## 2019-08-31 NOTE — Telephone Encounter
Lizette Mendes LPN ([phone]) called writer and confirmed that Pt will have visiting nurses daily mondays through fridays and VNS will pre- pour medications for self  Administration for  Every Saturday and Sunday.  Pt informed Lizette LPN that provider increased lithium. Writer reviewed  Chart and provider's last progress note and verified that lithium was increased to 1350mg  daily.( lithium CR 450 mg( take 3 tablets in the am for a total daily dose of 1350mg  by mouth daily.) Lizette LPN acknowledged understanding. Vance Gather, RN6/22/202111:30 AM

## 2019-09-01 ENCOUNTER — Inpatient Hospital Stay: Admit: 2019-09-01 | Discharge: 2019-09-01 | Payer: MEDICAID | Primary: Cardiovascular Disease

## 2019-09-01 DIAGNOSIS — F332 Major depressive disorder, recurrent severe without psychotic features: Secondary | ICD-10-CM

## 2019-09-01 DIAGNOSIS — F411 Generalized anxiety disorder: Secondary | ICD-10-CM

## 2019-09-01 DIAGNOSIS — F3189 Other bipolar disorder: Secondary | ICD-10-CM

## 2019-09-01 NOTE — Group Note
Group Session 2:  Group TherapyGroup Start Time:   11:10 AM      End Time:  12:00 PMFacilitators:  Sparkle Aube, LCSW; [place], Janyth Pupa, PCTVIDEO TELEHEALTH VISIT, PLATFORM OTHER THAN MYCHART: This clinician is conducting this visit at a site within our enrolled clinical location..  For this visit the clinician and patient were present via interactive audio & video telecommunications system that permits real-time communications, via a platform other than MyChart.Platforms other than MyChart may be used only during the COVID-19 crisis, when MyChart is not an option, and video is required for clinical reasons. Reason for choice of video platform other than MyChart: Currently [facility] has identified Zoom as the appropriate telehealth platform to facilitate IOP Group Therapy.PLEASE NOTE: CONSENT FOR THIS VISIT IS DIFFERENT THAN FOR MYCHART VIDEO VISITS! Verbal consent must be obtained by the clinician or staff prior to starting visit.  Read the Following Statements to Video Visit Participants:  ?(Name of clinician) will be conducting your video visit today. Before we begin, we must obtain your consent for today's visit.Can you confirm your name, date of birth, and state where you (or your child, if applicable) are currently located?By consenting, you understand that today's visit will be conducted through video conferencing technology other than our usual secure service. While we do everything possible to ensure your privacy, we may not be able to guarantee the security of this particular platform. You understand that the visit will not be recorded, and that we will be documenting this visit in our electronic medical record, and that we may disclose records concerning this interaction to other clinicians. You understand that a video visit may limit our ability to diagnose your condition.  You understand that if you are experiencing a medical emergency, you will be directed to dial 9-1-1 as we are not able to connect you directly. You also understand if your condition changes after this visit and you need of further care, you will contact our office. Do you agree to this consent, knowing that you can change your decision at any time? Do you have any other questions before we proceed??Patient consent given for video visit: yesState patient is located in: [place]: at patient's home addressThe clinician is appropriately licensed in the above state to provide care for this visit.Other individuals present during the telehealth encounter and their role/relation: Identified group facilitator(s), support staff as needed, and fellow group participants.Session Focus Connection between feelings, thoughts and behaviorsDevelop skills to manage issues and improve functioning Number of Participants 3 Treatment Modality Group Therapy and Psycho-education Interventions/Education Assisted patient addressing stressful life situationsAssisted patient with discharge planningAssisted patient with strategies for managing illness/symptomsEncouraged increased cooperation with othersEncouraged increased initiation in groupsEncouraged patient to increase treatment focusEncouraged patient to verbalize, rather than enactEncouraged acceptance, tolerance and respect for othersEncouraged appropriate expression of thoughts and feelingsHelped decrease isolation/increase sense of belongingHelped identify feelings, thoughts and behaviors connectionHelped increase awareness of feelings, thoughts and behaviorsHelped patient acquire skills to build self-confidenceHelped patient identify strengthsHelped patient increase ability to gain support from othersHelped patient increase comfort with othersHelped patient increase interpersonal effectivenessHelped patient increase sense of acceptance by othersProvided assistance problem-solving challengesProvided positive reinforcement for successesProvided reinforcement for patient contributions to othersProvided reinforcement for use of adaptive coping skills Attendance            Partial attendance due to being sent home early - illness.  Total number of minutes attended:  InterventionsNot applicable - patient attended group without difficulty Degree of Participation Minimal Behavior Calm and Lethargic  Speech/Thought Process Coherent and Directed Affect/Mood Euthymic and Stable Insight Fair Judgment Fair Response to Interventions Attentive Individualization Today?s second group was conducted virtually through a ZOOM meeting and had participants share in detail more about how various stressors are leave them fell overwhelmed and hopeless.  Patient expressed not feeling well physically.  She asked to be lying down during group.  She seemed to be actively listening but requested to leave group early due to her feelings of nausea and discomfort. Patient did not express any suicidal or homicidal thoughts, plan or intent during this group. Plan Continue to assist patient with skills to improve functionContinue to assist patient with skills to manage symptomsContinue to reinforce use of positive coping skillsContinue to review and assess symptoms / issuesContinue to review and assess treatment goals / skill useContinue per Plan of Care / Interdisciplinary Plan of Care Rukia Mcgillivray, LCSW6/23/20213:16 PM

## 2019-09-01 NOTE — Telephone Encounter
Patient called this writer to report this since starting to take the new dose of lithium she has been feeling sick and vomiting.  Patient reported that she did not eat anything different or have any upset stomach prior to increased dose of medication.  Patient reports based on her symptoms at this time she is unsure if she be able to participate in today's group.  This Clinical research associate reported he will pass on this information to the program RN to assess and if needed patient may see the APRN.  Patient reported based on how she is feeling she was not really sure she wanted to attend IOP treatment today and this writer strongly encouraged patient to attend IOP treatment.  This Clinical research associate also reported that if patient was to see the APRN she would have to be in IOP treatment as so this is another reason to attend.  Patient continue to share her frustrations with med changes in IOP treatment and this writer validated patient's feelings and emotions and discussed he is working on a discharge plan for her but that the program has to assist patient and becoming stable 1st prior to discharge.  Clinician also reported that he has been advocating for patient within this program.Clinician called [facility] this morning; they are providing in-person IOP treatment for females only; provide on site childcare/daycare during IOP treatment, have a current provider Dr. Cindie Laroche.  They also have ?the [facility] program patient navigator is a which are peers who have been successful in the program continue to receive support and support other mothers in the community with resources and general support. Clinician triaged above-mentioned information to RN for follow-up.Cindie Crumbly, LCSW6/23/20219:49 AM

## 2019-09-01 NOTE — Telephone Encounter
Writer called Zoe Scott to follow up regarding  Zoe Scott report of vomiting this am after lithium. Zoe Scott reported she took her lithium at 730am and threw up so hard that I have a headache. Writer asked what Zoe Scott ate prior to lithium dose. Zoe Scott replied Nothing I have a hard time eating Writer preformed medication education and informed Zoe Scott that she should always take lithium with food. Zoe Scott agreed to eat crackers or a yogurt prior to taking lithium. Zoe Scott reported she was going to take OTC medication for a h/a and then join group meeting at 10am. Writer informed Zoe Scott that I will relay information to provider. Writer also called  visiting nurse Michiel Cowboy LPN  ([phone] )to instruct Zoe Scott to eat a snack prior to taking lithium. No answer by LPN .Writer left a message on VM. Awaiting return call. Writer notified jessica dinisco APRN by routing this note at 1035am. Vance Gather, [phone] AM

## 2019-09-01 NOTE — Other
Group Start Time: ??10:00 AM??????End Time: ?11:00 AMFacilitators:??Cindie Crumbly, LCSWVIDEO TELEHEALTH VISIT, PLATFORM OTHER THAN MYCHART: This clinician is conducting this visit at a site within our enrolled clinical location.Marland Kitchen ?For this visit the clinician and patient were present via interactive audio & video telecommunications system that permits real-time communications, via a platform other than MyChart.?Platforms other than MyChart may be used only during the COVID-19 crisis, when MyChart is not an option, and video is required for clinical reasons. ?Reason for choice of video platform other than MyChart: Currently [facility] has identified Zoom as the appropriate telehealth platform to facilitate IOP Group Therapy.?PLEASE NOTE: CONSENT FOR THIS VISIT IS DIFFERENT THAN FOR MYCHART VIDEO VISITS! ?Verbal consent must be obtained by the clinician or staff prior to starting visit.??Read the Following Statements to Video Visit Participants:????(Name of clinician) will be conducting your video visit today. Before we begin, we must obtain your consent for today's visit.Can you confirm your name, date of birth, and state where you (or your child, if applicable) are currently located?By consenting, you understand that today's visit will be conducted through video conferencing technology other than our usual secure service. While we do everything possible to ensure your privacy, we may not be able to guarantee the security of this particular platform. You understand that the visit will not be recorded, and that we will be documenting this visit in our electronic medical record, and that we may disclose records concerning this interaction to other clinicians. You understand that a video visit may limit our ability to diagnose your condition.??You understand that if you are experiencing a medical emergency, you will be directed to dial 9-1-1 as we are not able to connect you directly. You also understand if your condition changes after this visit and you need of further care, you will contact our office. Do you agree to this consent, knowing that you can change your decision at any time? Do you have any other questions before we proceed???Patient consent given for video visit: yes?State patient is located in: [place] At home?LocationThe clinician is appropriately licensed in the above state to provide care for this visit.??Session Focus? Connection between feelings, thoughts and behaviorsDevelop skills to manage issues and improve functioningStatus of symptoms / issuesTreatment goal and skill useDaily goal setting Number of Participants? 5 Treatment Modality? Group Therapy Interventions/Education? Assisted patient addressing stressful life situationsAssisted patient with discharge planningAssisted patient with strategies for addressing peer issuesAssisted patient with strategies for managing illness/symptomsAssisted patient with terminationEncouraged increased cooperation with othersEncouraged increased initiation in groupsEncouraged patient to increase treatment focusEncouraged patient to verbalize, rather than enactEncourages patient to provide constructive feedback to othersAssisted patient with strategies for addressing family issuesAssisted patient with strategies for addressing school issuesEncouraged acceptance, tolerance and respect for othersEncouraged appropriate expression of thoughts and feelingsEncouraged improved listening skillsEncouraged patient to request as-needed help solving problemsHelped decrease isolation/increase sense of belongingHelped identify feelings, thoughts and behaviors connectionHelped increase awareness of feelings, thoughts and behaviorsHelped patient acquire skills to build self-confidenceHelped patient identify strengthsHelped patient increase ability to gain support from othersHelped patient increase comfort with othersHelped patient increase interpersonal effectivenessHelped patient increase sense of acceptance by othersProvided assistance problem-solving challengesProvided positive reinforcement for successesProvided reinforcement for patient contributions to othersProvided reinforcement for use of adaptive coping skills ?Attendance???????????? AttendedEngagement InterventionsNot applicable - patient attended group without difficulty Degree of Participation? Active Behavior Alert, Cooperative, and Interactive Speech/Thought Process Coherent, Directed and Relevant Affect/Mood Anxious, Depressed Insight Moderate Judgment Moderate Response to Interventions? Attentive, Engaged, Interested and Receptive Individualization? SUMMARY NOTE Group  1:During IOP Group Therapy via ZOOM Patient presented on screen laying in her bed and reported to not feel well which she had stated to this writer prior to treatment today.  Patient participated nonverbally throughout the group however when it was her turn to check and patient did run not respond to verbal prompts was not visible on the screen it appears patient may have fallen asleep or step to waited caretaker child.  Patient was prompted multiple times to return to group and participate as this writer moved on to the next patient patient did not return to the group setting.  Clinician attempted to prompt patient through chat which she did not respond to.  Group was over and this Clinical research associate returned his desk to call patient writer was informed the patient had returned and was on screen with her child to participate in the 2nd group.  Patient reports to be taking their medication as prescribed; reported in phone conversation prior to this group and reports concerns throwing up after taking her medication but it appears patient is not eating with her lithium. Patient reports currently to not be experiencing any ideation, intent, or plans in reference to suicidal or homicidal symptomatology. Plan? Continue to assist patient with skills to improve functionContinue to assist patient with skills to manage symptomsContinue to reinforce use of positive coping skillsContinue to review and assess symptoms / issuesContinue to review and assess treatment goals / skill useContinue per Plan of Care / Interdisciplinary Plan of Care ??C-SSRS Since Last Visit? ? [place] - Suicide Severity Screen? ? Most Recent Value Have you wished you were dead or wished you could go to sleep and not wake up? ?No Have you actually had any thoughts of killing yourself?  ?No Have you been thinking about how you might kill yourself?  ?No [place] Suicide Risk Level ?Low Risk ??Cindie Crumbly, LCSW4:56 PM6/23/2021

## 2019-09-02 ENCOUNTER — Inpatient Hospital Stay: Admit: 2019-09-02 | Discharge: 2019-09-02 | Payer: MEDICAID | Primary: Cardiovascular Disease

## 2019-09-02 DIAGNOSIS — F332 Major depressive disorder, recurrent severe without psychotic features: Secondary | ICD-10-CM

## 2019-09-02 DIAGNOSIS — F1210 Cannabis abuse, uncomplicated: Secondary | ICD-10-CM

## 2019-09-02 DIAGNOSIS — F3189 Other bipolar disorder: Secondary | ICD-10-CM
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
## 2019-09-02 NOTE — Telephone Encounter
Clinician received information from cope facilitator that patient reported that she does not want to attend groups and does not want to attend the [facility] program any further and would like individual therapy.  Clinician called patient leave a voicemail message stating that he would like to have a more formal conversation with her to discuss discharge planning and recommendations.  Clinician left a wreck number as well as front desk number for patient to return call and connect with this clinician.Cindie Crumbly, LCSW6/24/20211:36 PMPatient followed up in called patient again later in the evening and left an additional voicemail message that this writer would really like to speak with her in reference to treatment even if she is choosing to disengage from program.  By end of treatment day patient not return messages to this Clinical research associate.  Clinician follow up at met with patient as it appears patient is disengaging from IOP treatmentDavid Bartholome Bill, LCSW6/24/20216:16 PM

## 2019-09-06 ENCOUNTER — Inpatient Hospital Stay: Admit: 2019-09-06 | Discharge: 2019-09-06 | Payer: MEDICAID | Primary: Cardiovascular Disease

## 2019-09-06 DIAGNOSIS — F332 Major depressive disorder, recurrent severe without psychotic features: Secondary | ICD-10-CM

## 2019-09-06 DIAGNOSIS — F1210 Cannabis abuse, uncomplicated: Secondary | ICD-10-CM

## 2019-09-06 NOTE — Other
Patient was a no call no-show for IOP treatment; clinician called to inquired about her as well as left her the following voicemail information.  The patient's case will be closed for noncompliance/non adherence to treatment recommendation she was a no call no-show for 3 consecutive treatment days; today was her 2nd consecutive no-show.  Clinician also reported he understood from information provided by reception staff that she no longer wanted treatment and encouraged her to alternatively connect with the following service; a comparable level of IOP treatment that may better meet her needs.  Clinician encouraged this writer to return his call let him know that she is okay and that she understands the referral recommendation information.  No follow-up at this time await patient contact.[facility] [address] [place], [place] [address] [phone] Bartholome Bill, LCSW6/28/20211:43 PM
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
## 2019-09-07 ENCOUNTER — Encounter: Admit: 2019-09-07 | Payer: PRIVATE HEALTH INSURANCE | Primary: Cardiovascular Disease

## 2019-09-07 ENCOUNTER — Telehealth: Admit: 2019-09-07 | Payer: PRIVATE HEALTH INSURANCE | Primary: Cardiovascular Disease

## 2019-09-07 NOTE — Progress Notes
Writer [phone] requesting a pre pour medications for Monday July 5th 2021 for pt to self administer. Writer informed Lizette LPN  that pt  As of yesterday is a no call no show for 2 consecutive days( see Cancel No show note by Hawley Shi LCSW for details) Pt did not call today and pt did not show to IOP program. Writer asked Lizette LPN to have pt call [place] Shi LCSW ASAP because no call no show for 3 consecutive days the pt's case may be closed.  In that case  [facility] provider will not sign for Visiting nursing services. Lizette LPN  Reported she saw pt today and pt informed her she does not want to participate in IOP any further. Lizette LPN  Reported she will share information with pt by contacting pt by phone and relay above information. No further action is necessary at this time. Zoe Scott, RN6/29/20212:20 PM

## 2019-09-08 ENCOUNTER — Encounter: Admit: 2019-09-08 | Payer: PRIVATE HEALTH INSURANCE | Primary: Cardiovascular Disease

## 2019-09-08 ENCOUNTER — Encounter
Admit: 2019-09-08 | Payer: PRIVATE HEALTH INSURANCE | Attending: Psychiatric/Mental Health | Primary: Cardiovascular Disease

## 2019-09-08 ENCOUNTER — Encounter: Admit: 2019-09-08 | Payer: PRIVATE HEALTH INSURANCE | Attending: Clinical | Primary: Cardiovascular Disease

## 2019-09-08 ENCOUNTER — Inpatient Hospital Stay: Admit: 2019-09-08 | Discharge: 2019-09-08 | Payer: MEDICAID | Primary: Cardiovascular Disease

## 2019-09-08 ENCOUNTER — Ambulatory Visit: Payer: BC Managed Care – PPO | Attending: Family Medicine | Admitting: Family Medicine

## 2019-09-08 ENCOUNTER — Encounter (HOSPITAL_BASED_OUTPATIENT_CLINIC_OR_DEPARTMENT_OTHER): Payer: Self-pay | Admitting: Family Medicine

## 2019-09-08 ENCOUNTER — Telehealth (HOSPITAL_BASED_OUTPATIENT_CLINIC_OR_DEPARTMENT_OTHER): Payer: Self-pay

## 2019-09-08 ENCOUNTER — Other Ambulatory Visit: Payer: Self-pay

## 2019-09-08 VITALS — BP 119/76 | HR 121 | Temp 98.2°F | Wt 215.0 lb

## 2019-09-08 DIAGNOSIS — N926 Irregular menstruation, unspecified: Secondary | ICD-10-CM | POA: Diagnosis not present

## 2019-09-08 DIAGNOSIS — Z6836 Body mass index (BMI) 36.0-36.9, adult: Secondary | ICD-10-CM | POA: Insufficient documentation

## 2019-09-08 DIAGNOSIS — R87612 Low grade squamous intraepithelial lesion on cytologic smear of cervix (LGSIL): Secondary | ICD-10-CM | POA: Insufficient documentation

## 2019-09-08 DIAGNOSIS — Z113 Encounter for screening for infections with a predominantly sexual mode of transmission: Secondary | ICD-10-CM | POA: Insufficient documentation

## 2019-09-08 DIAGNOSIS — Z833 Family history of diabetes mellitus: Secondary | ICD-10-CM | POA: Insufficient documentation

## 2019-09-08 DIAGNOSIS — Z0000 Encounter for general adult medical examination without abnormal findings: Secondary | ICD-10-CM | POA: Diagnosis present

## 2019-09-08 DIAGNOSIS — Z8616 Personal history of covid-19: Secondary | ICD-10-CM | POA: Diagnosis not present

## 2019-09-08 DIAGNOSIS — F411 Generalized anxiety disorder: Secondary | ICD-10-CM

## 2019-09-08 DIAGNOSIS — F3189 Other bipolar disorder: Secondary | ICD-10-CM

## 2019-09-08 DIAGNOSIS — F1210 Cannabis abuse, uncomplicated: Secondary | ICD-10-CM

## 2019-09-08 DIAGNOSIS — F332 Major depressive disorder, recurrent severe without psychotic features: Secondary | ICD-10-CM

## 2019-09-08 DIAGNOSIS — R5383 Other fatigue: Secondary | ICD-10-CM

## 2019-09-08 HISTORY — DX: Personal history of COVID-19: Z86.16

## 2019-09-08 LAB — LIPID PANEL
Cholesterol: 187 mg/dL (ref 0–239)
HIGH DENSITY LIPOPROTEIN: 65 mg/dL (ref 40–?)
LOW DENSITY LIPOPROTEIN DIRECT: 106 mg/dL (ref 0–189)
TRIGLYCERIDES: 71 mg/dL (ref 0–150)

## 2019-09-08 LAB — HEPATITIS C ANTIBODY: HEPATITIS C ANTIBODY: NONREACTIVE

## 2019-09-08 MED ORDER — BENZTROPINE 2 MG TABLET
2 mg | ORAL_TABLET | Freq: Two times a day (BID) | ORAL | 1 refills | Status: AC
Start: 2019-09-08 — End: 2019-10-06

## 2019-09-08 MED ORDER — ZOLPIDEM 5 MG TABLET
5 mg | ORAL_TABLET | Freq: Every evening | ORAL | 1 refills | Status: AC | PRN
Start: 2019-09-08 — End: 2021-03-16

## 2019-09-08 MED ORDER — LITHIUM CARBONATE ER 450 MG TABLET,EXTENDED RELEASE
450 mg | ORAL_TABLET | Freq: Every day | ORAL | 1 refills | Status: AC
Start: 2019-09-08 — End: 2019-09-21

## 2019-09-08 MED ORDER — HYDROXYZINE HCL 25 MG TABLET
25 mg | ORAL_TABLET | ORAL | 1 refills | Status: AC | PRN
Start: 2019-09-08 — End: ?

## 2019-09-08 NOTE — Telephone Encounter (Signed)
Dawn Chars, MD  [facility] Front Desk Pool  Please add to wait list for October pap and hpv thanks            All set.  Kerrin Mo, 09/08/2019

## 2019-09-08 NOTE — Progress Notes (Signed)
SUBJECTIVE:. Dawn Merritt is a 24 year old female  here for routine visit.    Noted some btb thrid week of OCP, wonders if ok. Not missing any pills.  Has very light pain free menses, really happy about that.     Attributes weight gain during Covid to decreased activity and increased meal frequency and portions and she is still working from home.  Remembers how she lost weight previously and hopes to institute those same good habits of decreased p.o. intake and increased activity.  Returning to in person working in the coming weeks.    Was sexually active with a female partner, used condoms always.  That relationship ended she has met someone else and they are not yet sexually active.  She denies any GYN symptoms however would like STI testing.    ROS: No TIA's or unusual headaches, no dysphagia.  No prolonged cough. No dyspnea or chest pain on exertion.  No abdominal pain, change in bowel habits, black or bloody stools.  No urinary tract symptoms.  No new or unusual musculoskeletal symptoms.  Normal menses, no abnormal vaginal bleeding, discharge or unexpected pelvic pain. No new breast lumps, breast pain or nipple discharge.    Social History    Tobacco Use      Smoking status: Passive Smoke Exposure - Never Smoker      Smokeless tobacco: Never Used      Tobacco comment: dad smoked in house    Alcohol use: Yes      Comment: 1-2 weekly    Drug use: No     OBJECTIVE: She appears well, in no apparent distress.  Alert and oriented times three, pleasant and cooperative. Vital signs are as noted by the nurse.   BP 119/76    Pulse 121    Temp 98.2 F (36.8 C)    Wt 97.5 kg (215 lb)    LMP 08/28/2019    SpO2 97%    BMI 36.90 kg/m    Ears normal. Throat and pharynx normal. Neck supple. No adenopathy or masses in the neck or supraclavicular regions. Sinuses non tender.  S1 and S2 normal, no murmurs, clicks, gallops or rubs. Regular rate and rhythm. Chest is clear; no wheezes or rales. No edema or JVD.   The abdomen is soft  without tenderness, guarding, mass, rebound or organomegaly. Bowel sounds are normal. No CVA tenderness or inguinal adenopathy noted.  Mental status exam; she is alert, orient to time, person and place. Normal thought content, speech, affect, mood and dress are noted.     Problem List        High    BMI 36.0-36.9,adult    Overview     09/08/19 reviewed weight trajectory and what helped in past.  Will work on portion control and a goal of 1 hour of exercise daily.            Unprioritized    Family history of diabetes mellitus    Overview     Annual A1c, counseled again about importance healthy weight and exercise habits to prevent diabetes         History of COVID-19    Overview     05/2019 mild symptoms         Low grade squamous intraepith lesion on cytologic smear cervix (lgsil)    Overview     09/08/19 came in for CPE however will wait to be additional 4 months for her follow-up cotesting  01/06/19 NILM >> pap 1y 01/06/2020  Sept 2019 colpo no visible lesions, pathology: squamous metaplasia  09/2017 pap ASCUS-H  Age 74, first pap - plan repeat cytology 12 months  09/2016 pap ascus/h and lsil, will need colpo  CERVICAL BIOPSY 12:00:   -SQUAMOUS ATYPIA.   -TRANSFORMATION ZONE IS PRESENT.   MULTIPLE LEVELS EXAMINED.          CERVICAL BIOPSY 9:00:   -NEGATIVE FOR DYSPLASIA.   -TRANSFORMATION ZONE IS PRESENT.      - ENDOCERVICAL CURETTAGE:   -FRAGMENTS OF UNREMARKABLE ENDOCERVICAL EPITHELIUM.     7/19 ASCUS-H HPV neg, will need repeat colpo    >>FINAL DIAGNOSIS<<      CERVICAL BIOPSY, 5:00:   ATYPICAL SQUAMOUS METAPLASIA.   NO DEFINITIVE DYSPLASIA IS IDENTIFIED.   MULTIPLE LEVELS ARE EXAMINED.   p16 IMMUNOSTAIN SHOWS WEAK AND PATCHY (NONSPECIFIC) EXPRESSION WITHIN   ATYPICAL SQUAMOUS METAPLASIA, WHILE KI-67 DEMONSTRATES APPROPRIATE   REACTIVITY, PREDOMINANTLY WITHIN THE BASAL EPITHELIAL LAYER.      ENDOCERVICAL CURETTAGE:   BENIGN ENDOCERVICAL TISSUE.   NO DYSPLASIA IS IDENTIFIED.   MULTIPLE LEVELS ARE EXAMINED.    p16 AND KI-67 ARE BOTH LARGELY NEGATIVE WITHIN THE TISSUE SAMPLE.     Per ASCCP guidelines, will continue with pap and colpo q 6 months x 2 years or until 2 normals in a row.             Menstrual irregularity        For routine health maintenance STI screening is done today, counseling on safer sex, healthy diet and exercise patterns, follow-up in October for repeat Pap and HPV cotesting.

## 2019-09-08 NOTE — Other
Clinician and APRN met with Patient via telephone to go over AVS/ Discharge paperwork. (See todays? APRN Progress note).Patient discharge plan is the following; Dewitt Hoes APRN for medication management and counseling.  Patient does not have an appointment date and time at this point but has recently called her and is trying to reconnect as this was her previous provider.  Patient also will continue with her visiting nurse services from [facility].  Patient will be provided a bridge to care appointment with [facility]; Clyda Greener APRN for 09/21/2019 at 4:00 p.m.Sharlynn Oliphant Dinisco APRN patient with a 2 week refill of her current medication regiment and she made sure patient has enough medication to get to bridge appointment.  Clinician also reviewed patient progress comparing admit symptomatology and discharge symptomatology identifying progress in treatment and further areas of improvement to work on.  In addition clinician updated patient's safety plan; please see AVS and Psychiatric Safety Plan.  Patient reported to have no questions at this time and verbalized full understanding of the discharge instructions and is in agreement with the follow up plan. Patient was unable to physically sign AVS and was provided with a copy via My Chart. Please see AVS scanned into Pt's EMR under media tab. Cindie Crumbly, LCSW6/30/20214:07 PM

## 2019-09-08 NOTE — Discharge Instructions
Zoe Scott here is a step-by-step of the plan;Continue to call Zoe Hoes APRN and obtain an appointment for medication management and counseling.  If you read into trouble ask your visiting nurse to help you with this process to get connected.  Please come to the [facility] program at [address], [place] on September 21, 2019 at 4:00 p.m.; you can bring your son.  You will meet with Zoe Greener APRN for [facility] medication management appointment.  She will at that time make sure that you either have enough medication or another appointment to bridge you until you see Zoe Scott.  You must continue to see your visiting nurse regularly and stay consistent taking her medication as you have been.  You must have an appointment with Zoe Scott in hand at the time of your appointment with [place] on September 21, 2019. If you have any safety issues please as we reviewed her safety plan use it or go to the [facility] emergency department or your local emergency department.  As part of her safety plan please leave her child with your mother if you need to go to the hospital.  If you have any questions concerns or something you do not understand please call me or the nurse at the [facility] program as I still will be your direct clinician until you fully transition out; Zoe [place] Shi LCSW or Zoe Scott at [phone].

## 2019-09-08 NOTE — Other
Patient was a no call no-show for IOP treatment today.  This is patient's 3rd consecutive no-show for treatment today.  Clinician to follow up with clinical team in reference to discharge planning.Cindie Crumbly, LCSW6/30/20215:05 PM

## 2019-09-08 NOTE — Telephone Encounter
Clinician received phone call from patient as she reported that her nurse requested she called this Clinical research associate.  Clinician discussed with patient his recommendation for continued IOP treatment if not at this program then an alternative treatment program that has childcare; clinician reviewed that this program is going in-person IOP treatment and patient reported ?unless I can bring my kid there I can not do that.  Patient reported ?I do not want to do anymore groups hearing other people's problems does not help me.  Patient reported she is calling because ?I need to stay on my medication because if I get off it I am going to get sick.  Clinician reported he would advocate for patient but at this time he could not make the decision alone but would bring to the treatment team to make a decision.  Clinician reported ?if I call you back later today are you please going to pick up the phone.  Patient reports she would pick up the phone reported that she had missed this writer's previous calls but acknowledged not returning any of the calls.  Clinician presented above-mentioned information to clinical coordinator Cruzita Lederer LCSW to to further discuss with the clinical team.  Clinician follow up with patient imminent.Cindie Crumbly, LCSW6/30/20213:23 PM

## 2019-09-08 NOTE — Telephone Encounter
Writer received a telephone call from [facility]- [phone]) requesting a pre pour medications for self administration for sat 09/11/2019, sun.09/12/2019 and   Monday July 5th . Writer informed her pt is a no call no show today and ma verbal  Understanding. Writer called Lizette LPN at [phone]. No answer. Writer left a VM  reporting no decision has been made and the treatment team is collaborating currently and instructed Lizette LPN to call to Clinical research associate tomorrow am for decision.  Vance Gather, RN6/30/20212:39 PM

## 2019-09-08 NOTE — Telephone Encounter
This Clinical research associate and [facility] Shi called Taylee at 3:35pm on 09/08/19 to review any changes and discharge plan. She reports, I think the lithium is helping. She feels more motivated and less depressed. Sion reports no suicidal thoughts or thoughts of not wanting to wake up. She reports no concerns with medications. Plan includes: 1. Ongoing visiting nursing care 2. Bridge-2- care with Waymon Amato at [facility] on 7/13/20213. Repeat lithium level prior to appointment on 09/21/2019 @4pm4 . Schedule a follow-up with Dewitt Hoes, APRN for psychotherapy and medication management 5. This Clinical research associate will provide a two week refill on Zoe Scott's medicationsReviewed safety plan to call the clinic, the National Suicide Prevention Lifeline: 646-553-3861, or  911 to go to the ER if safety is at risk. Not deemed at imminent risk of harm to self/others at this timePatient verbalized understanding and had no additional questions. Electronically Signed:Vedh Ptacek, NP 09/08/2019 3:49 PM

## 2019-09-09 ENCOUNTER — Encounter: Admit: 2019-09-09 | Payer: PRIVATE HEALTH INSURANCE | Primary: Cardiovascular Disease

## 2019-09-09 ENCOUNTER — Other Ambulatory Visit: Payer: Self-pay

## 2019-09-09 ENCOUNTER — Encounter (HOSPITAL_BASED_OUTPATIENT_CLINIC_OR_DEPARTMENT_OTHER): Payer: Self-pay | Admitting: Family Medicine

## 2019-09-09 LAB — HIV ANTIGEN ANTIBODY 5TH GEN: HIVAGAB QUALITATIVE: NONREACTIVE

## 2019-09-09 LAB — HEMOGLOBIN A1C
ESTIMATED AVERAGE GLUCOSE: 82 (ref 74–160)
HEMOGLOBIN A1C: 4.5 % (ref 4.0–5.6)

## 2019-09-09 LAB — CHLAMYDIA GC NAAT
CHLAMYDIA TRACHOMATIS NAAT: NEGATIVE
NEISSERIA GONORRHOEAE NAAT: NEGATIVE

## 2019-09-09 LAB — RPR: RPR QUALITATIVE: NONREACTIVE

## 2019-09-09 NOTE — Progress Notes
Writer called and spoke with pt regarding DSM-5 self rated sx measure . Pt expressed relief that her sxs have significantly decreased or resolved since admission to IOP. Writer reviewed that pt has a  Bridge to care appt with Clyda Greener APRN on 09/21/2019 at 4pm. Pt to reconnect with Dewitt Hoes APRN ( pt's previous provider). Pt to continue with Visiting nurse Michiel Cowboy LPN  daily Monday through Friday with a pre pour medications for satudays and sundays.  Writer informed pt that [employer] approved the pre pour for the observed holiday on Monday 09/13/2019 in addition to 09/11/2019 and 09/12/2019. Pt acknowledged verbal understanding . Vance Gather, RN7/1/202111:49 AM

## 2019-09-09 NOTE — Progress Notes
Writer called Michiel Cowboy LPN ([phone]). No answer writer left a VM  That  In addition to 09/11/2019 and 09/12/2019 provider approved pre pour medication for Monday July 5th 2021 for the observed hoilday. Awaiting return call. Vance Gather, RN7/1/202111:58 AM

## 2019-09-14 ENCOUNTER — Encounter: Admit: 2019-09-14 | Payer: PRIVATE HEALTH INSURANCE | Attending: Psychiatry | Primary: Cardiovascular Disease

## 2019-09-14 ENCOUNTER — Encounter: Admit: 2019-09-14 | Payer: PRIVATE HEALTH INSURANCE | Primary: Cardiovascular Disease

## 2019-09-14 MED ORDER — CARIPRAZINE 4.5 MG CAPSULE
4.5 mg | ORAL_CAPSULE | Freq: Every day | ORAL | 1 refills | Status: AC
Start: 2019-09-14 — End: 2019-09-21

## 2019-09-14 NOTE — Progress Notes
Pt's viisting nurse called and spoke with Clinical research associate. Michiel Cowboy LPN [phone] asked Clinical research associate for a refill of Vraylar. Writer reviewed chart and then consulted with Dr. Maryelizabeth Kaufmann MD who refilled Vrayler 4.5mg  capsules # 30. Writer called and informed Lizette regarding refill. Writer inquired if pt  reconnected with Dewitt Hoes APRN to provide MM  . Lizette Retail banker she will check with pt tomorrow when she visits pt. Vance Gather, RN7/6/202112:57 PM

## 2019-09-21 ENCOUNTER — Inpatient Hospital Stay: Admit: 2019-09-21 | Discharge: 2019-09-21 | Payer: MEDICAID | Primary: Cardiovascular Disease

## 2019-09-21 ENCOUNTER — Ambulatory Visit: Admit: 2019-09-21 | Payer: PRIVATE HEALTH INSURANCE | Attending: Nurse Practitioner | Primary: Cardiovascular Disease

## 2019-09-21 DIAGNOSIS — F3189 Other bipolar disorder: Secondary | ICD-10-CM

## 2019-09-21 MED ORDER — ONDANSETRON 8 MG DISINTEGRATING TABLET
8 mg | Status: DC
Start: 2019-09-21 — End: 2020-01-23

## 2019-09-21 MED ORDER — LITHIUM CARBONATE ER 450 MG TABLET,EXTENDED RELEASE
450 mg | ORAL_TABLET | Freq: Every day | ORAL | 1 refills | Status: AC
Start: 2019-09-21 — End: 2019-10-04

## 2019-09-21 MED ORDER — NORETHINDRONE 1 MG-ETHINYL ESTRADIOL 20 MCG (21)-IRON 75 MG (7) TABLET
1 mg-20 mcg (2)/75 mg (7) | Status: AC
Start: 2019-09-21 — End: 2020-11-29

## 2019-09-21 MED ORDER — TRETINOIN 0.025 % TOPICAL CREAM
0.025 % | Status: DC
Start: 2019-09-21 — End: 2021-11-24

## 2019-09-21 MED ORDER — CARIPRAZINE 4.5 MG CAPSULE
4.5 mg | ORAL_CAPSULE | Freq: Every day | ORAL | 1 refills | Status: AC
Start: 2019-09-21 — End: 2019-10-06

## 2019-09-22 ENCOUNTER — Ambulatory Visit: Admit: 2019-09-22 | Payer: PRIVATE HEALTH INSURANCE | Attending: Nurse Practitioner | Primary: Cardiovascular Disease

## 2019-09-22 ENCOUNTER — Encounter: Admit: 2019-09-22 | Payer: PRIVATE HEALTH INSURANCE | Attending: Nurse Practitioner | Primary: Cardiovascular Disease

## 2019-09-22 DIAGNOSIS — F3189 Other bipolar disorder: Secondary | ICD-10-CM

## 2019-09-22 LAB — ZZZTOXICOLOGY SCREEN, URINE     (BH L)
BKR 6-ACETYLMORPHINE: NEGATIVE
BKR AMPHETAMINE SCREEN, URINE: NEGATIVE
BKR BARBITURATE SCREEN, URINE: NEGATIVE
BKR BENZODIAZEPINE SCREEN, URINE: NEGATIVE
BKR CANNABINOID SCREEN, URINE: POSITIVE — AB
BKR COCAINE METABOLITES URINE: NEGATIVE
BKR ETHANOL URINE: NEGATIVE
BKR METHADONE METABOLITE SCREEN, URINE, NO CONF.: NEGATIVE
BKR OPIATE SCREEN, URINE: NEGATIVE
BKR OXYCODONE SCREEN, URINE: NEGATIVE
BKR PHENCYCLIDINE SCREEN, URINE: NEGATIVE
BKR PROPOXYPHENE: NEGATIVE
LITHIUM LEVEL: NEGATIVE mmol/L (ref 0.6–1.2)

## 2019-09-23 ENCOUNTER — Encounter: Admit: 2019-09-23 | Payer: PRIVATE HEALTH INSURANCE | Primary: Cardiovascular Disease
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
## 2019-09-29 ENCOUNTER — Encounter: Admit: 2019-09-29 | Payer: PRIVATE HEALTH INSURANCE | Primary: Cardiovascular Disease

## 2019-09-29 NOTE — Progress Notes
Writer received a VM from [employer] LPN ([phone]). Lizette LPN reported that pt attended one therapy appt and has therapy appts every Tuesday going forward. Writer called lizette LPN and thanked her for the update. Vance Gather, RN7/21/20211:41 PM

## 2019-10-02 ENCOUNTER — Inpatient Hospital Stay: Admit: 2019-10-02 | Discharge: 2019-10-02 | Payer: MEDICAID | Primary: Cardiovascular Disease

## 2019-10-02 LAB — COMPREHENSIVE METABOLIC PANEL
BKR A/G RATIO: 1.5 % (ref 1.0–2.2)
BKR ALANINE AMINOTRANSFERASE (ALT): 10 U/L (ref 10–35)
BKR ALBUMIN: 4.4 g/dL (ref 3.6–4.9)
BKR ALKALINE PHOSPHATASE: 39 U/L (ref 9–122)
BKR ANION GAP: 8 % (ref 7–17)
BKR ASPARTATE AMINOTRANSFERASE (AST): 17 U/L (ref 10–35)
BKR AST/ALT RATIO: 1.7 x 1000/??L (ref 0.0–0.4)
BKR BILIRUBIN TOTAL: 0.3 mg/dL (ref <=1.2)
BKR BLOOD UREA NITROGEN: 10 mg/dL (ref 6–20)
BKR BUN / CREAT RATIO: 8.9 (ref 8.0–23.0)
BKR CALCIUM: 9.2 mg/dL (ref 8.8–10.2)
BKR CHLORIDE: 105 mmol/L (ref 98–107)
BKR CO2: 25 mmol/L (ref 20–30)
BKR CREATININE: 1.12 mg/dL (ref 0.40–1.30)
BKR EGFR (AFR AMER): >60 mL/min/{1.73_m2} (ref >60)
BKR EGFR (NON AFRICAN AMERICAN): 59 mL/min/{1.73_m2} (ref >60)
BKR GLOBULIN: 2.9 g/dL
BKR GLUCOSE: 102 mg/dL — ABNORMAL HIGH (ref 70–100)
BKR POTASSIUM: 4 mmol/L (ref 3.3–5.1)
BKR PROTEIN TOTAL: 7.3 g/dL (ref 6.6–8.7)
BKR SODIUM: 138 mmol/L (ref 136–144)
BKR WAM BASOPHIL ABSOLUTE COUNT: 2.9 g/dL (ref 0.0–0.2)
BKR WAM HEMATOCRIT: 4 mmol/L (ref 3.3–5.1)
BKR WAM MCHC: 25 mmol/L (ref 20–30)

## 2019-10-02 LAB — CBC WITH AUTO DIFFERENTIAL
BKR WAM ABSOLUTE IMMATURE GRANULOCYTES: 0.1 x 1000/ÂµL (ref 0.0–0.4)
BKR WAM ABSOLUTE LYMPHOCYTE COUNT: 1.1 x 1000/ÂµL (ref 0.5–5.4)
BKR WAM ABSOLUTE NRBC: 0 x 1000/ÂµL
BKR WAM ANALYZER ANC: 10.5 x 1000/??L — ABNORMAL HIGH (ref 2.2–7.2)
BKR WAM BASOPHILS: 0.4 % (ref 0.0–2.0)
BKR WAM EOSINOPHIL ABSOLUTE COUNT: 0.1 x 1000/ÂµL (ref 0.0–0.4)
BKR WAM EOSINOPHILS: 0.7 % (ref 0.0–4.0)
BKR WAM HEMOGLOBIN: 13.6 g/dL (ref 12.0–15.0)
BKR WAM IMMATURE GRANULOCYTES: 0.4 % (ref 0.0–0.4)
BKR WAM LYMPHOCYTES: 8.8 % — ABNORMAL LOW (ref 10.0–50.0)
BKR WAM MCH (PG): 30 pg (ref 25.0–35.0)
BKR WAM MCV: 89.6 fL (ref 81.0–99.0)
BKR WAM MONOCYTE ABSOLUTE COUNT: 0.6 x 1000/??L (ref 0.1–1.2)
BKR WAM MONOCYTES: 4.7 % (ref 3.0–11.0)
BKR WAM MPV: 11.3 fL (ref 8.0–12.0)
BKR WAM NEUTROPHILS: 85 % (ref 45.0–90.0)
BKR WAM NUCLEATED RED BLOOD CELLS: 0 % (ref 0.0–0.0)
BKR WAM PLATELETS: 207 x1000/??L — ABNORMAL HIGH (ref 120–450)
BKR WAM RDW-CV: 12.5 % (ref 11.5–14.5)
BKR WAM RED BLOOD CELL COUNT: 4.5 M/??L (ref 3.5–5.5)
BKR WAM WHITE BLOOD CELL COUNT: 12.3 x1000/??L — ABNORMAL HIGH (ref 4.8–10.8)

## 2019-10-02 LAB — LITHIUM LEVEL: BKR LITHIUM LEVEL: 1.8 mmol/L — ABNORMAL HIGH (ref 0.60–1.20)

## 2019-10-02 MED ORDER — DIAZEPAM 5 MG TABLET
5 mg | Freq: Once | ORAL | Status: CP
Start: 2019-10-02 — End: ?
  Administered 2019-10-02: 20:00:00 5 mg via ORAL

## 2019-10-02 MED ORDER — SODIUM CHLORIDE 0.9 % BOLUS (NEW BAG)
0.9 % | Freq: Once | INTRAVENOUS | Status: CP
Start: 2019-10-02 — End: ?
  Administered 2019-10-02: 17:00:00 0.9 mL/h via INTRAVENOUS

## 2019-10-02 MED ORDER — LORAZEPAM 1 MG TABLET
1 mg | Freq: Once | ORAL | Status: CP
Start: 2019-10-02 — End: ?
  Administered 2019-10-02: 16:00:00 1 mg via ORAL

## 2019-10-02 NOTE — ED Triage Note
I evaluated this patient as the Physician in triage and completed a medical screening examination.  Patient will need further care.  Initial care orders will be placed as appropriate.Juliann Mule, MD11:35 AM

## 2019-10-02 NOTE — ED Provider Notes
HistoryChief Complaint Patient presents with ? Medical Problem   Thinks she is overdosing on her Lithium, thinks she is crawling out of her skin. Dosage was changed last week  Patient reports history of depression for which she takes lithium.  One week ago she was advised to increase the dose of lithium.  Over that time she has felt increasingly anxious, restless.  She has developed tremors.  She is not having itching but feels compelled to scratch her arms.  She denies any hallucinations or delusions.  She denies thoughts of harming herself or others.The history is provided by the patient.  Past Medical History: Diagnosis Date ? Anxiety  ? Depression  ? Strep throat  ? Suicide attempt [facility] Code)   April 2020: Ingestion of pills and cut wrists Past Surgical History: Procedure Laterality Date ? DENTAL SURGERY   ? HERNIA REPAIR Right age 24  RLQ area ? HERNIA REPAIR   ? TONSILLECTOMY Bilateral 2013 ? TONSILLECTOMY   Family History Problem Relation Age of Onset ? Depression Father  ? Anxiety disorder Father  ? Schizophrenia Maternal Aunt  Social History Socioeconomic History ? Marital status: Single   Spouse name: Not on file ? Number of children: Not on file ? Years of education: Not on file ? Highest education level: Not on file Occupational History ? Occupation: Unemployed Tobacco Use ? Smoking status: Never Smoker ? Smokeless tobacco: Never Used Substance and Sexual Activity ? Alcohol use: No ? Drug use: No ? Sexual activity: Yes   Partners: Male   Birth control/protection: Condom Relationships ? Social Manufacturing systems engineer on phone: Not on file   Gets together: Not on file   Attends religious service: Not on file   Active member of club or organization: Not on file   Attends meetings of clubs or organizations: Not on file   Relationship status: Not on file ? Intimate partner violence Fear of current or ex partner: No   Emotionally abused: No   Physically abused: No   Forced sexual activity: No Social History Narrative  05/11/19: Patient currently living with paternal grandmother and her 76 year old son in [place], [place]. Current open case with DCF from last inpatient hospitalization when she attempted suicide. Patient has high school degree and is currently attending [employer], taking nursing classes.  ED Other Social History ? Amphetamine frequency Never used Never used on 06/18/2018 ? Cannabis frequency 3 or more times/week Never used on 06/18/2018 Never used ->3 or more times/week on 12/15/2018 3 or more times/week ->Daily on 05/11/2019 Daily ->3 or more times/week on 05/24/2019 ? Cocaine frequency Never used Never used on 06/18/2018 ? Ecstasy frequency Never used Never used on 06/18/2018 ? Hallucinogen frequency Never used Never used on 06/18/2018 ? Inhalant frequency Never used Never used on 06/18/2018 ? Opiate frequency Never used Never used on 06/18/2018 ? Sedative frequency Never used Never used on 06/18/2018 ? Other drug frequency Never used Never used on 06/18/2018 E-cigarette/Vaping Substances E-cigarette/Vaping Devices Review of Systems Constitutional: Positive for activity change, appetite change and fatigue. Negative for chills, diaphoresis, fever and unexpected weight change. HENT: Negative.  Respiratory: Negative.  Gastrointestinal: Negative for abdominal pain, nausea and vomiting. Skin: Negative.  Neurological: Positive for tremors. Negative for light-headedness and headaches. Psychiatric/Behavioral: Positive for decreased concentration, dysphoric mood and self-injury. Negative for hallucinations, sleep disturbance and suicidal ideas. The patient is nervous/anxious and is hyperactive.   Physical ExamED Triage Vitals [10/02/19 1133]BP: 122/82Pulse: 65Pulse from  O2 sat: n/aResp: 17Temp:  98.5 ?F (36.9 ?C)Temp src: n/aSpO2: 99 % BP 100/66  - Pulse 70  - Temp 98.5 ?F (36.9 ?C)  - Resp 16  - Wt 63.4 kg (139 lb 12.4 oz)  - SpO2 100%  - BMI 23.99 kg/m? Physical ExamConstitutional:     Appearance: Normal appearance. Pulmonary:    Effort: Pulmonary effort is normal. Skin:   General: Skin is warm and dry. Neurological:    Mental Status: She is alert.    Comments: Mild fine tremors to both hands. Psychiatric:       Attention and Perception: Attention and perception normal.       Mood and Affect: Mood is anxious and depressed. Affect is not labile, flat, angry, tearful or inappropriate.       Speech: Speech normal.       Behavior: Behavior normal. Behavior is cooperative.       Thought Content: Thought content normal. Thought content is not paranoid or delusional. Thought content does not include homicidal or suicidal ideation. Thought content does not include homicidal or suicidal plan.  ProceduresProcedures ED COURSEInterpreted by ED Provider: labs and ECGPatient Reevaluation: D/W psychiatric team.  We agree that the patient need not stay as a psychiatric patient per se and not have an in-person psychiatric consult.  Patient will be hydrated, observed.  If she feels better she will be advised to skip dose of lithium tomorrow morning and contact the prescriber for follow-up and further instructions.1440 - patient says she feels better, she appears calm, she has no tremors.  She feels comfortable to be discharged.  She will be dosed now with a longer-acting benzodiazepine, Valium.  She is discharged and advised as above.Patient progress: improvedClinical Impressions as of Oct 01 1452 Elevated lithium level Anxiety  ED DispositionDischarge Augustin Coupe, PA07/24/21 1452 Augustin Coupe, PA07/24/21 1454

## 2019-10-02 NOTE — ED Notes
12:07 PM 24 y.o. female presents to ED with c/o feeling like im crawling out of my skin. Pt reports being unable to stay still and feels the need to constantly move. Pt reports s/s began shortly after lithium dose was increased. Pt states she [facility] with [facility] for [facility] care and med refills. Pt denies other s/s including nausea, vomiting, fever, chills, abdominal pain, chest pain, or shortness of breath. Pt denies SI/HI and does report becoming homeless last week because she reports my dads house is too toxic. Pt tearful but cooperative with care, pt avoiding eye contact. Pt states she feels safe at this time.12:26 PMProvider at bedside.

## 2019-10-02 NOTE — ED Notes
1:20 PMSandwich provided while waiting for Psych consult.

## 2019-10-04 ENCOUNTER — Encounter: Admit: 2019-10-04 | Payer: PRIVATE HEALTH INSURANCE | Primary: Cardiovascular Disease

## 2019-10-04 MED ORDER — LITHIUM CARBONATE ER 450 MG TABLET,EXTENDED RELEASE
450 mg | ORAL_TABLET | 1 refills | Status: DC
Start: 2019-10-04 — End: 2021-11-24

## 2019-10-04 NOTE — Telephone Encounter
This Clinical research associate contacted patient as she reported going to the emergency room.  Epic notes reviewed.  Patient went for lithium level on 09/22/2019 level 1.2.  Lithium level on 10/02/2019 1.80.  Patient reports taking lithium earlier that day.  She reports that she has been feeling better since the emergency room visit but has not taken lithium in 2 days.  Discussed decreasing lithium 450 milligrams to 2 tablets daily by mouth and obtaining repeat lithium level next week.  The patient agreed to this change.  This Clinical research associate will be assessing patient this Wednesday as she has a follow-up appointment at [facility].  Patient complained that anxiety has been a major problem for her as well as her tremors.  Last visit patient had very minor tremors noted with hands extended.  Will continue to monitor.  Will request [facility] R.N. contact visiting nurse tomorrow morning in regard to change in lithium dose.

## 2019-10-04 NOTE — Progress Notes
Pt left a VM for writer to call pt regarding elevated lithium level over the weekend. Pt reported I went to ED because I felt horrible all week so I went to ED on Saturday 10/02/2019. Pt reported that she did not take lithium on 10/03/2019 and 10/04/2019 because they told me not to until I spoke with [facility]. Pt reported she was given a one time dose of ativan then valium and it helped a lot. I am having panic attacks. Writer clarified that pt has a [facility] appt on Wednesday with [facility] provider unless pt is given a scheduled MM appt with Sunday Shams APRN this week. Pt has been 2 2 therapy appts with out pt therapist and has another therapy appt tomorrow at 1pm. Writer asked pt to Regulatory affairs officer after appt tomorrow and Chief Financial Officer. Writer informed pt that Clinical research associate will inform Chartered loss adjuster and call pt back. Pt acknowledged verbal understanding. Vance Gather, RN7/26/2021 1:43 PM

## 2019-10-05 ENCOUNTER — Encounter: Admit: 2019-10-05 | Payer: PRIVATE HEALTH INSURANCE | Primary: Cardiovascular Disease

## 2019-10-05 NOTE — Progress Notes
Writer called  Bari Edward endes LPN ( [phone]). No answer. Writer left a VM to decrease  lithium to lithiumCR450 mg tablets( take two tablets daily by mouth ( total dose 900mg  daily).Repeat lithium level next week. Writer asked lizette LPN to Regulatory affairs officer at [facility]. Awaiting return call. Vance Gather, RN7/27/202110:09 AM

## 2019-10-06 ENCOUNTER — Encounter: Admit: 2019-10-06 | Payer: PRIVATE HEALTH INSURANCE | Primary: Cardiovascular Disease

## 2019-10-06 ENCOUNTER — Inpatient Hospital Stay: Admit: 2019-10-06 | Discharge: 2019-10-06 | Payer: MEDICAID | Primary: Cardiovascular Disease

## 2019-10-06 DIAGNOSIS — F332 Major depressive disorder, recurrent severe without psychotic features: Secondary | ICD-10-CM

## 2019-10-06 DIAGNOSIS — F411 Generalized anxiety disorder: Secondary | ICD-10-CM

## 2019-10-06 DIAGNOSIS — F3189 Other bipolar disorder: Secondary | ICD-10-CM

## 2019-10-06 DIAGNOSIS — F1210 Cannabis abuse, uncomplicated: Secondary | ICD-10-CM

## 2019-10-06 MED ORDER — SULFAMETHOXAZOLE 800 MG-TRIMETHOPRIM 160 MG TABLET
800-160 mg | Status: AC
Start: 2019-10-06 — End: 2021-03-16

## 2019-10-06 MED ORDER — BENZTROPINE 2 MG TABLET
2 mg | ORAL_TABLET | 1 refills | Status: AC
Start: 2019-10-06 — End: 2021-03-16

## 2019-10-06 MED ORDER — NORETHINDRONE 1 MG-ETHINYL ESTRADIOL 20 MCG (21)-IRON 75 MG (7) TABLET
1-2021757 mg-20 mcg (2)/75 mg (7) | Status: AC
Start: 2019-10-06 — End: 2021-03-16

## 2019-10-06 MED ORDER — VRAYLAR 3 MG CAPSULE
3 mg | ORAL_CAPSULE | Freq: Every day | ORAL | 1 refills | Status: AC
Start: 2019-10-06 — End: 2020-11-29

## 2019-10-06 NOTE — Progress Notes
[facility] SERVICESThe [facility] [phone] [address] AvenueBridgeport Eastpointe [address] Number: [phone] Number: [phone] Outpatient Psychiatry MD/LIP Progress Note7/28/2021Visit Start Time:  4:45      Visit End Time:  5:1126 minutesAdditional time spent reviewing chart on day of visit: 10 minutesSession Type:  Medication ManagementAmanda B Scott is a 24 y.o., Single, female.SUBJECTIVE Chief Complaint: the anxiety in my chest and shakingInterim History:  Met with the patient today for medication management Bridge to care appointment.  This Clinical research associate contacted patient during the week as she had been to the emergency room (see epic notes) and discussed decreasing lithium 450 milligrams to 2 tablets daily for a total of 900 milligrams daily.  Patient continues to complain ?feeling like I want to get out of my skin I want to pace around, I do not feel good as well as shakiness.  Patient feels frail or it may be causing some of her symptoms.  Patient had been prescribed Cogentin 2 milligrams b.i.d. and reports that she has only been taking morning dose of Cogentin.  This Clinical research associate discussed discontinuing HS dose in taking Cogentin 2 milligrams in the morning along with right lower.  Patient requested Vraylar be decreased.  She is due to see a new provider within the next week or to but has not confirmed an appointment at.  Discussed difficulty with cross titrating medications at this time as patient will be transferring to a new medication provider.  Patient denies that she has had any suicidal thoughts or homicidal thoughts or auditory or visual hallucinations.  Patient reports being on Seroquel prior to starting Vraylar and when discharged from the hospital.  She has also been on lithium in the past.  May consider a retrial of Seroquel.  At this time discussed decreasing Vraylar to 3 milligrams daily and monitoring for any changes in behavior/mood as patient complained last visit as well as this visit of similar symptoms which may be attributed to Vraylar.  Patient noted to have slight tremor to left hand/arm.  Patient also describe restlessness with leg.  Safety was reviewed with the patient as well should she have any suicidal thoughts or increase in symptoms and that she should contact 911 or go to the emergency room which she agreed.  This Clinical research associate to have [facility] R.N. contact visiting nurse tomorrow regarding medication changes.  This Clinical research associate also requested that patient go for repeat lithium level on Monday which she agreed to do as well as urine toxicology.  The patient denies that she has been using any cannabis or other substances.  The patient reports using Ambien possibly twice a week however sleeps without Ambien as well.REVIEW OF ALLERGIES/MEDICATIONS/HISTORY I have reviewed the patient's current medications, allergies and past medical historyI have reviewed with the patient, the risks and benefits of the medications prescribed.OBJECTIVE Review of SystemsReview of Systems Neurological: Positive for tremors. Psychiatric/Behavioral: The patient is nervous/anxious.   LabsAs reviewed in act pick undocumented and last noteCurrent MedicationsCurrent Outpatient Medications Medication Sig ? benztropine Take 1 tablet in the morning by mouth ? Vraylar Take 1 capsule (3 mg total) by mouth daily. ? lithium Take 2 tablets at bedtime by mouth ? norethindrone-e.estradioL-iron Aurovela Fe 1-20 (28) 1 mg-20 mcg (21)/75 mg (7) tablet TAKE 1 TABLET BY MOUTH EVERY DAY ? norethindrone-e.estradioL-iron Aurovela Fe 1-20 (28) 1 mg-20 mcg (21)/75 mg (7) tablet ? ondansetron ondansetron 8 mg disintegrating tablet prn ? sulfamethoxazole-trimethoprim TAKE 1 TABLET BY MOUTH TWICE DAILY FOR 3 DAYS ? tretinoin Retin-A  0.025 % topical cream ? zolpidem Take 1 tablet (5 mg total) by mouth nightly as needed for sleep. Mental Status ExamGeneral AppearanceHabitus:  MediumGrooming:  GoodMusculoskeletalStrength and Tone: Strength normalGait and Station: Stable gait and stable posturePsychiatricAttitude: Cooperative, good eye contact and pleasantPsychomotor Behavior: No psychomotor activation or retardation tremorsSpeech: normal rate, volume and prosodyMood: Euthymic not sad, not depressed   Patient reported mood:  I don't feel goodAffect: bluntThought Process: Coherent, logical, goal directedAssociations: NormalThought Content: Normal no auditory hallucinations, no command auditory hallucinations, no paranoid delusions, not perseverativeSuicidal Ideation: No current suicidal plan, ideation or intentHomicidal Ideation: No current homicidal ideation, plan or intentJudgment:  PoorInsight:  FairCognitive EvaluationOrientation: Oriented to person, oriented to place and oriented to date/time Attention and Concentration:  Normal attention and concentrationMemory: Recent and remote memory intactLanguage:  Language intactFund of Knowledge: average.Abstract Reasoning: Normal capacity for abstract reasoningSafety and Risk AssessmentPSY RISK ASSESSMENT SAFE-T WITH C-SSRS  Reason for Assessment:  Routine Ambulatory Psychiatric AssessmentC-SSRS: Suicidal Ideation:  Since Last Visit  WISH TO BE DEAD:  No  CURRENT SUICIDAL THOUGHTS:  No  Have you ever done anything, started to do anything or prepared to do anything to end your life?:  YesRisk Assessment:   Access to Lethal Methods:  NoCurrent and Past Psychiatric Diagnoses:    Mood Disorder:  Recurrent/Current  Psychotic Disorder:  No  Alcohol/Substance Abuse Disorders:  Recurrent/Current  PTSD:  No  ADHD:  No  TBI:  No  Cluster B Personality Disorders or Traits:  No  Conduct Problems:  No  Suicide Attempt:  No Prior Attempts  Presenting Symptoms: Anhedonia:  No  Impulsivity:  Yes  Hopelessness or Despair:  No  Anxiety and/or Panic:  Yes  Insomnia:  No  Command Hallucinations:  No  Psychosis:  No  Self-injurious Behavior:  Yes  Physical Aggression Toward Others:  No  Violence, Victimization and/or Perpetration:  No  Other (add comment):  Yes (Reports restlessness)  Family History:   Suicide:  No  Suicidal Behavior:  No  Axis I Psychiatric Diagnosis requiring hospitalization:  No  Precipitants / Stressors:   Triggering events leading to humiliation, shame and/or despair:  No  Chronic physical pain or other acute medical problem:  No  Sexual / Physical Abuse:  No  Substance Intoxication or Withdrawal:  Yes  Pending Incarceration:  No  Legal Problems:  Yes  Inadequate Social Supports:  No  Social Isolation:  No  Perceived burden on others:  No  Bullying / Cyberbullying:  No  Media portrayal of suicide:  No  Housing Insecurity:  No  Financial Insecurity:  Yes  Stressful Life Events:  Yes  Gender / Sexual Identity:  No  Homelessness:  No  Change in Treatment:    Recent inpatient discharge:  No  Change in provider or treatment:  No  Hopeless or dissatisfied with provider or treatment:  No  Non-compliant:  Yes  Not receiving treatment:  NoCurrent and Past Psychiatric Diagnoses:  Mood Disorder:  Recurrent/Current  Psychotic Disorder:  No  Alcohol/Substance Abuse Disorders:  Recurrent/Current  PTSD:  No  ADHD:  No  TBI:  No  Cluster B Personality Disorders or Traits:  No  Conduct Problems:  No  Suicide Attempt:  No Prior AttemptsProtective Factors:   Internal:   Ability to cope with stress:  Yes  Frustration tolerance:  Yes  Religious beliefs:  Yes (Christian)  Fear of death or the actual act of killing self:  Yes  Identifies reasons for living:  Yes  Problem solving skills:  Yes  External:   Cultural, spiritual and/or moral attitudes against suicide: Yes  Responsibility to children:  Yes  Beloved pets:  Yes  Supportive social network of friends or family:  Yes  Positive therapeutic relationships / Effective mental healthcare:  YesRisk to Self - Self-Injurious Behavior:   Current Urges to harm Self:  No  Recent Self-Injury:  No  History of Self Injury:  No  Imminent Risk for Self Injury in Community:  Low  Imminent Risk for Self Injury in Facility:  LowRisk to Others:   Current Agitation:  No  Homicidal/Aggressive Ideation:  No  Homicidal/Aggressive Threat/Plan:  No  Recent Violence/Aggression:  NoWithdrawal Risk:   Alcohol / Benzodiazepines / Barbiturates:  Not applicable     Alcohol/Benzodiazepine/Barbiturate Risk for Withdrawal:  Absent  Opioids:  Not applicable     Opioid Risk for Withdrawal:  AbsentMEDICAL DECISION MAKING DiagnosisEncounter Diagnoses Name Primary? ? 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 Cannabis abuse  ? Generalized anxiety disorder  R/O bipolar disorder1.	Number and complexity of problems (Low, moderate, or high): moderate/high2.	Amount and Complexity of data reviewed and analyzed (Low, moderate, or high): moderate/high3.	Risk of Complications of morbidity and mortality of patient management (Low, moderate, or high): moderateOverall Risk AssessmentModerate - undiagnosed new problem with uncertain prognosis (includes prescription drug management)IMPRESSION & PLAN Formulation / AssessmentPatient reports restlessness feeling as if she ?wants to get out of her skin? and shakiness.  Slight Tremor observed to left hand arm.  Patient is working reports going to work today from 11 to 4 and doing well although reports almost dropping a tray at work.  Lithium was recently decreased to 900 milligrams daily.  Discussed decreasing rail are as patient feels Vraylar may be contributing to restlessness.  Agreed to decrease Vraylar to 3 milligrams daily.  Discussed concern of possible increase in symptoms with decrease in medication.  Patient to go for lithium level and urine toxicology Monday.  Safety was reviewed with the patient should she have any increase in symptoms.The Self-Report [place] Risk Suicide Severity Rating Scale Since Last Visit was completed by Margarita Grizzle and reviewed by myself today.  Additionally, risk assessment from [facility] intake assessment and follow up screenings have been reviewed.  Based on Shatona B Sproule?s clinical presentation today as well as her self-reported symptom ratings today and over the current course of treatment, I have assessed Margarita Grizzle to be at unchanged risk of harm to self as compared with prior recent assessments.  Based on this assessment, I have continued current treatment plan including implementation of factors to mitigate risks of harm to self as outlined in the last risk assessmentIntervention(s)Patient to connect with new medication provider within the next 1 to 2 weeksSafety plan reviewed with the patient should she have any suicidal thoughts or increase in symptomsLabs/Test/Consults OrderedLithium level Monday 8/2/21Medication ChangesDecrease Vraylar to 3 milligrams 1 capsule daily by mouthContinue lithium 450 milligrams 2 tablets by mouthDecrease Cogentin 2 milligrams to 1 tablet in the morning by mouthContinue Ambien 5 milligrams 1 tablet at night as needed for sleep by mouthPlanContinue current treatment plan, patient to be discharged from [facility] once connected with new medication providerPatient to contact [facility] at the end of this week regarding her appointment with new providerElectronically SignedProvider:  Clyda Greener, APRN7/28/20214:46 PM

## 2019-10-06 NOTE — Progress Notes
Writer called and spoke with pt. Pt reported that she her therapist(Donna) will call pt today to confirm appt with Dewitt Hoes APRN  during  the first week of august 2021. Pt has a bridge to care appt with Waymon Amato APRN today at 430pm. Vance Gather, RN7/28/20219:30 AMAddendum: writer received a VM from [employer] [phone]). Lizette LPN reported she received writer's message yesterday. Lizette LPN started lithiumCR 450mg  2 tablets daily by mouth .Total dose 900mg  daily this am. Vance Gather, RN7/28/202110:44 AM
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
## 2019-10-08 ENCOUNTER — Encounter: Admit: 2019-10-08 | Payer: PRIVATE HEALTH INSURANCE | Primary: Cardiovascular Disease

## 2019-10-08 NOTE — Progress Notes
Writer called Michiel Cowboy [phone]). No answer Writer left a message that vraylar was decreased to 3mg  by mouth daily. Writer instructed LPN to d/c night time cogentin and give cogentin 2mg  by mouth in the am daily . No change in lithium CR 450mg  give 2 tablets by mouth daily in the am. Pt to obtain lithium level next week. Writer also asked Lizette LPN to ask pt when appt is with her next provider for MM. Awaiting return call. Vance Gather, RN7/30/202111:40 AM

## 2019-10-11 ENCOUNTER — Encounter: Admit: 2019-10-11 | Payer: PRIVATE HEALTH INSURANCE | Primary: Cardiovascular Disease

## 2019-10-11 NOTE — Progress Notes
Myrna Blazer LPN ([phone] called and spoke with Clinical research associate. Lizette reported she received messages from Clinical research associate regarding medication changes . Lizette LPN reported pt is seen daily in the am with a pre pour for saturdays and sundays. Vance Gather, RN8/2/202110:55 AM

## 2019-10-12 ENCOUNTER — Encounter: Admit: 2019-10-12 | Payer: PRIVATE HEALTH INSURANCE | Attending: Psychiatry | Primary: Cardiovascular Disease

## 2019-10-14 ENCOUNTER — Encounter: Admit: 2019-10-14 | Payer: PRIVATE HEALTH INSURANCE | Attending: Psychiatry | Primary: Cardiovascular Disease

## 2019-10-14 ENCOUNTER — Encounter: Admit: 2019-10-14 | Payer: PRIVATE HEALTH INSURANCE | Primary: Cardiovascular Disease

## 2019-10-14 NOTE — Progress Notes
Writer received a VM from [employer] [phone]) Nelva Bush LPN reported that pt informed her of the following update regarding MM provider. Pt informed lizette LPN that she does not have an appt with a MM provider because the provider is on vacation. Pt reported that she may not like the new provider so she may decide to look for a provider on her own. Lizette LPN reported that pt has not obtained lab work and will go soon. Writer routed this note  To [employer] Naughton LCSW and Clyda Greener APRN at 2 04pm Vance Gather, RN8/5/20212:04 [facility]: Clinical research associate spoke with Solmon Ice LCSW. Then Clinical research associate attempted to call the office of Dewitt Hoes APRN.( [phone]). VM gave no identifying information so writer left a message for Dewitt Hoes APRN to call writer or Solmon Ice LCSW at [facility] [phone]). Pt had informed [facility] that she was supposed to have a MM appt. With Dewitt Hoes APRN the 1st or 2nd week of august 2021. Awaiting return call. Vance Gather, RN8/5/20212:37 PM

## 2019-10-22 ENCOUNTER — Encounter: Admit: 2019-10-22 | Payer: PRIVATE HEALTH INSURANCE | Primary: Cardiovascular Disease

## 2019-10-22 NOTE — Progress Notes
Pt called and spoke to writer at 1215pm. Pt reported she has been having appts with her therapist Lupita Leash  and visits are by telephone every other week. Pt reported Lupita Leash informed her that Dewitt Hoes APRN was not her provider because pt did not have an appt with APRN. Pt reported that regular visiting nurse was on vacation last week and nurse did not show up yesterday 10/21/2019 so I only took 1 lithium yesterday. . When writer asked why pt did not notify writer yesterday am. Pt replied  I had to work and I was too busy.  Writer asked if she had enough medication to cover her through Monday and pt reported yes and  Michiel Cowboy LPN is coming back on Monday. Pt reported other bottles of  medication are locked up in the locked box. Writer informed pt to call on Monday am and writer will inform tx team and call pt to review situation and come up with a plan. Pt became tearful on the phone and then reported I just took 2 lithium. Writer clarified the dose and pt reported she took 2 lithium CR 450mg  2 tablets for a total of 900mg . Writer asked if this was a suicide attempt and pt replied no I just felt anxious. Writer educated pt regarding lithium and that lithium can not be used when necessary for anxiety. Pt reported she is supposed to take lithium 900mg  total tonight. Writer instructed pt to skip tonight's dose of lithium and resume schedule on 10/23/2019. Writer reviewed safety plan and to call 911 or go to ED if pt has feelings of self harm. Pt agreed to take medications as scheduled and repeated that it was impulsive action to take 2 lithium and acknowledged verbal understanding of medication teaching. Then Clinical research associate called and spoke with Clyda Greener APRN and reviewed above information. Writer called and spoke with pt at 1245pm and reviewed above information. Writer informed pt that treatment team will try and contact Lupita Leash at the office of Dewitt Hoes APRN on Monday. This seemed to reassure pt who became calm and speech was no longer pressured.Pt reported she was starting a new job on 11/08/2019. Writer inquired and pt repeated that taking lithium today was not a suicide attempt and if she starts having any feelings of self harm  Pt verbally agreed to call 911 or go to the ED. Pt agreed to call [facility] on 10/25/2019 and Clinical research associate or [facility] staff member will update pt.  Pt acknowledged understanding. Vance Gather, RN8/13/20211:36 PM

## 2019-10-25 ENCOUNTER — Telehealth: Admit: 2019-10-25 | Payer: PRIVATE HEALTH INSURANCE | Primary: Cardiovascular Disease

## 2019-10-25 NOTE — Telephone Encounter
Patient's home care nurse Harriett Sine, from [facility] left a voice mail that she was unable to administer medication today because the patient was not home. Nurse  knocked on the door but there was no answer. Harriett Sine also said on the voice mail that patient has an appointment tomorrow with her primary nurse tomorrow and that she is also going to call her to inform her as well. Larey Brick, RN8/16/20215:05 PM.

## 2019-10-26 ENCOUNTER — Encounter: Admit: 2019-10-26 | Payer: PRIVATE HEALTH INSURANCE | Attending: Clinical | Primary: Cardiovascular Disease

## 2019-10-26 NOTE — Telephone Encounter
Clinician called patient left a voicemail message requesting she returned this writer's call today to talk about her care.  In addition clinician sent a similar message through my chart requesting patient called this writer directly at the main number for [facility].Cindie Crumbly, LCSW8/17/20212:01 PMClinician received a phone call from patient and she gave this writer verbal permission to speak with Jeanmarie Hubert [facility] to provide clinical, demographic, and insurance information and to get her connected to outpatient mental health services.  Clinician reported they will do an assessment and make their own determination what level and type of care she needs but they provide the services that we feel would be helpful to her. The [facility] programs clinical team recommendation is that we not feel at this time Dewitt Hoes APRN is a viable option for her and that we can no longer see her for outpatient mental health/medication management treatment.  Clinician reported that she could return to our IOP treatment and patient reports ?that is not an option for me.  Reports she is starting a new job soon.  Patient reports that she has not received her medication in 4 days because the nurse has not arrived in a timely fashion or she is missed them and that her meds are in a locked box.  Clinician encouraged patient to discuss visiting nurse services with her new provider; Jeanmarie Hubert.  Clinician obtained an intake appointment for Friday October 29, 2019 at 10:00 a.m. and provided this information to the patient as well as the address of the program; patient reports she had been there before for an intake and knew where the program was located and how to get there.  Patient reported she is having some family drama, but starting a new job soon, that will be good.  Patient reported she ?does and does not want to take my medication.  Clinician reported that his fear for patient if she does not take her medication as prescribed is that she will end up back in the emergency department or worse and strongly encouraged patient to continue to take her medication as prescribed but to discuss her concerns with a visiting nurse and her schedule with her new provider as they may be able to come up with an accommodation that would best meet her needs.  Patient denied SI HI plan intent or ideation and had future oriented plans as well as overall reported to be in a good mood and had a positive outlook towards the future.  Patient reported she would follow up with the plan this writer provided and that she feels the other provider who has not called me back either may not be a good option anyway.  Clinician wished patient well and will inform the clinical team of patient's upcoming appointment.Cindie Crumbly, LCSW8/17/20214:22 PM

## 2019-10-26 NOTE — Care Coordination-Inpatient
Clinician made formal referral on patient's behalf per her request.  Clinician faxed patient demographic sheet and last APRN provider note dated 10/06/2019 to both offices of the following provider:Kathleen A Deirdre Peer Aprn, [facility], [address] [place], [place] [address]UEAVW: [phone]: [phone] [facility], [place], [place] [address]XBMWU: [phone]: [phone] Bartholome Bill, LCSW8/17/20218:48 AM

## 2019-10-27 ENCOUNTER — Encounter: Admit: 2019-10-27 | Payer: PRIVATE HEALTH INSURANCE | Primary: Cardiovascular Disease

## 2019-10-27 NOTE — Progress Notes
Writer received a VM from [employer] LPN ([phone]).Writer attempted to call LPN back. No answer. Writer left a VM to Regulatory affairs officer at [employer].Writer left a VM  Confirming  that pt has appt with Remus Blake on Friday 10/29/2019 at 10am.  Lizette LPN  Has been visiting pt daily Monday through Friday. With a prepour for the weekend. On Friday  10/29/2019 [facility] provider can no longer provide Plan of care orders or refill medication in the future. awating return call. Vance Gather, RN8/18/202110:42 AMLizette LPN called and spoke with writer at 12noon. LIzetteLPN reported she visited pt today. Per Lizette LPN pt is aware of above information and plans on going to MM appt with Remus Blake on 10/29/2019 at 10am . Lizette  LPN reported pt has daily VNS Monday through Friday and a pre pour for self administration on the weekend. Writer informed kKtherine Conservation officer, nature Naughton LCSWat 115pm. Vance Gather, RN8/18/20211:16 PM

## 2019-10-29 NOTE — Telephone Encounter
This Clinical research associate received a call from visiting nurse, Earlie Raveling, from [facility] who reported that the patient missed her nursing visits yesterday (10/28/19) and today.  Patient completed IOP treatment at [facility] but was still being seen by Clyda Greener, APRN who was managing her medication until she connected with her new provider at [facility]. Per Earlie Raveling, this information was being provided to [place] as a courtesy as patient is scheduled to have her first appointment with Remus Blake today. Writer communicated this information to Larey Brick, RN.Jazzma Neidhardt Costantini8/20/202110:15 AM

## 2019-11-02 ENCOUNTER — Encounter: Admit: 2019-11-02 | Payer: PRIVATE HEALTH INSURANCE | Primary: Cardiovascular Disease

## 2019-11-03 NOTE — Progress Notes
Writer received a call from Michiel Cowboy LPN to give [facility] Tx team the following update. Pt informed Lizette LPN that pt attended appt at [facility] on Friday. Pt reported [facility] could not see pt because they were short staffed. Pt reported she was not currently taking her lithium and has an appt with Dewitt Hoes APRN on 11/04/2019. Writer thanked [employer] LPN for the update  nd informed Victorino Dike Naughton LCSW and [facility] APRN Vance Gather, RN8/25/20211:24 PM

## 2019-11-21 ENCOUNTER — Telehealth (HOSPITAL_BASED_OUTPATIENT_CLINIC_OR_DEPARTMENT_OTHER): Payer: Self-pay

## 2019-11-21 NOTE — Telephone Encounter (Signed)
For October pap and hpv  with Dr. Seymour Bars or Dr. Curly Rim    [facility]  Call made to [facility] toschedule an appointmentOctober pap and hpv  with Dr. Seymour Bars or Dr. Curly Rim. No interpreter needed.   Patient (self) did not answer at   this team member left a message asking for a call back.  I have completed the following communication and reminder steps: Patient notified by telephone  Kerrin Mo, 11/21/2019    On behalf of KPQ:AESLPNPYY Sabra Heck, MD

## 2019-12-01 ENCOUNTER — Other Ambulatory Visit (HOSPITAL_BASED_OUTPATIENT_CLINIC_OR_DEPARTMENT_OTHER): Payer: Self-pay | Admitting: Family Medicine

## 2019-12-01 DIAGNOSIS — R87612 Low grade squamous intraepithelial lesion on cytologic smear of cervix (LGSIL): Secondary | ICD-10-CM

## 2019-12-02 NOTE — Telephone Encounter (Signed)
[facility], Dawn Merritt is a 25 year old female has requested a refill of JUNEL 1/20 1-20 MG-MCG per tablet      Last Office Visit:09/08/2019 with miller,K  Last Physical Exam: 09/08/2019    PAP SMEAR due on 01/06/2020    Other Med Adult:  Most Recent BP Reading(s)  09/08/19 : 119/76        Cholesterol (mg/dL)   Date Value   09/08/2019 187     LOW DENSITY LIPOPROTEIN DIRECT (mg/dL)   Date Value   09/08/2019 106     HIGH DENSITY LIPOPROTEIN (mg/dL)   Date Value   09/08/2019 65     TRIGLYCERIDES (mg/dL)   Date Value   09/08/2019 71         No results found for: TSHSC      No results found for: TSH    HEMOGLOBIN A1C (%)   Date Value   09/08/2019 4.5       No results found for: POCA1C      No results found for: INR    SODIUM (mmol/L)   Date Value   04/23/2016 142       POTASSIUM (mmol/L)   Date Value   04/23/2016 3.9           CREATININE (mg/dL)   Date Value   04/23/2016 0.5       Documented patient preferred pharmacies:    [facility] [place], [place]  Phone: [phone] Fax: [phone]

## 2019-12-17 ENCOUNTER — Ambulatory Visit (HOSPITAL_BASED_OUTPATIENT_CLINIC_OR_DEPARTMENT_OTHER): Payer: Self-pay | Admitting: Family Medicine

## 2019-12-29 ENCOUNTER — Ambulatory Visit: Payer: BC Managed Care – PPO | Attending: Family Medicine | Admitting: Family Medicine

## 2019-12-29 ENCOUNTER — Other Ambulatory Visit: Payer: Self-pay

## 2019-12-29 ENCOUNTER — Telehealth (HOSPITAL_BASED_OUTPATIENT_CLINIC_OR_DEPARTMENT_OTHER): Payer: Self-pay | Admitting: Student in an Organized Health Care Education/Training Program

## 2019-12-29 VITALS — BP 115/75 | HR 98 | Wt 213.0 lb

## 2019-12-29 DIAGNOSIS — Z124 Encounter for screening for malignant neoplasm of cervix: Secondary | ICD-10-CM | POA: Insufficient documentation

## 2019-12-29 DIAGNOSIS — R87612 Low grade squamous intraepithelial lesion on cytologic smear of cervix (LGSIL): Secondary | ICD-10-CM | POA: Insufficient documentation

## 2019-12-29 NOTE — Telephone Encounter (Signed)
-----   Message from Annette Stable sent at 12/29/2019  9:39 AM EDT -----  Regarding: questions  Dawn Merritt [id number], 24 year old, female    Calls today:  pt is requesting a phone call regarding her pap smear appointment for today.  Person calling on behalf of patient: Patient (self)    CALL BACK NUMBER: [phone]  Best time to call back: any  Cell phone:   Other phone:    Patient's language of care: English    Patient does not need an interpreter.    Patient's PCP: [name], MD

## 2019-12-29 NOTE — Telephone Encounter (Signed)
Outgoing call to pt, pt identified.    Pt reporting she is on day 4 of period  Light bleeding, wondering if she needs to r/s pap this afternoon.    Will check with PCP and call back if reschedule necessary, otherwise she should come to appt. Pt agrees with plan and disposition, no further questions or concerns at this time.    Kristopher Glee, RN, 12/29/2019

## 2019-12-30 LAB — CHLAMYDIA GC NAAT
CHLAMYDIA TRACHOMATIS NAAT: NEGATIVE
NEISSERIA GONORRHOEAE NAAT: NEGATIVE

## 2019-12-30 NOTE — Progress Notes (Signed)
SUBJECTIVE: Dawn Merritt is a 24 year old female here for follow up pap.  Feeling well. OCP for contraception.  1 new partner since last STI check. No sx. Regular menses. No abnormal discharge. No irregular bleeding.  OBJECTIVE: She appears well, in no apparent distress.  Alert and oriented times three, pleasant and cooperative. Vital signs are as noted by the nurse.   Vagina and vulva are normal;  no discharge is noted.  Cervix normal without lesions.  Pap collected.    Problem List        Unprioritized    Low grade squamous intraepith lesion on cytologic smear cervix (lgsil)    Overview     12/30/19 pap (with reflex)  01/06/19 NILM >> pap 1y 01/06/2020  Sept 2019 colpo no visible lesions, pathology: squamous metaplasia  09/2017 pap ASCUS-H  Age 56, first pap - plan repeat cytology 12 months  09/2016 pap ascus/h and lsil, will need colpo  CERVICAL BIOPSY 12:00:   -SQUAMOUS ATYPIA.   -TRANSFORMATION ZONE IS PRESENT.   MULTIPLE LEVELS EXAMINED.          CERVICAL BIOPSY 9:00:   -NEGATIVE FOR DYSPLASIA.   -TRANSFORMATION ZONE IS PRESENT.      - ENDOCERVICAL CURETTAGE:   -FRAGMENTS OF UNREMARKABLE ENDOCERVICAL EPITHELIUM.     7/19 ASCUS-H HPV neg, will need repeat colpo    >>FINAL DIAGNOSIS<<      CERVICAL BIOPSY, 5:00:   ATYPICAL SQUAMOUS METAPLASIA.   NO DEFINITIVE DYSPLASIA IS IDENTIFIED.   MULTIPLE LEVELS ARE EXAMINED.   p16 IMMUNOSTAIN SHOWS WEAK AND PATCHY (NONSPECIFIC) EXPRESSION WITHIN   ATYPICAL SQUAMOUS METAPLASIA, WHILE KI-67 DEMONSTRATES APPROPRIATE   REACTIVITY, PREDOMINANTLY WITHIN THE BASAL EPITHELIAL LAYER.      ENDOCERVICAL CURETTAGE:   BENIGN ENDOCERVICAL TISSUE.   NO DYSPLASIA IS IDENTIFIED.   MULTIPLE LEVELS ARE EXAMINED.   p16 AND KI-67 ARE BOTH LARGELY NEGATIVE WITHIN THE TISSUE SAMPLE.     Per ASCCP guidelines, will continue with pap and colpo q 6 months x 2 years or until 2 normals in a row.             Relevant Orders    CYTOPATH, C/V, THIN LAYER (Completed)    OBTAINING SCREEN PAP SMEAR  (Completed)    CHLAMYDIA GC NAAT (Completed)        [name], MD, More than 20 minutes spent on this encounter, which may include patient interview and exam, history gathering from a third party, review of specialist documentation, review of outside records, review of test results, case coordination,  patient education and counseling, and documentation.

## 2020-01-02 LAB — CYTOPATH, C/V, THIN LAYER

## 2020-01-04 ENCOUNTER — Encounter (HOSPITAL_BASED_OUTPATIENT_CLINIC_OR_DEPARTMENT_OTHER): Payer: Self-pay | Admitting: Family Medicine

## 2020-01-08 ENCOUNTER — Other Ambulatory Visit (HOSPITAL_BASED_OUTPATIENT_CLINIC_OR_DEPARTMENT_OTHER): Payer: Self-pay | Admitting: Family Medicine

## 2020-01-08 DIAGNOSIS — R87612 Low grade squamous intraepithelial lesion on cytologic smear of cervix (LGSIL): Secondary | ICD-10-CM

## 2020-01-09 NOTE — Telephone Encounter (Signed)
PATIENT STILL HAVE REFILLS AT THE PHARMACY.

## 2020-01-23 ENCOUNTER — Inpatient Hospital Stay: Admit: 2020-01-23 | Discharge: 2020-01-23 | Payer: MEDICAID | Primary: Cardiovascular Disease

## 2020-01-23 ENCOUNTER — Encounter: Admit: 2020-01-23 | Payer: PRIVATE HEALTH INSURANCE | Primary: Cardiovascular Disease

## 2020-01-23 DIAGNOSIS — F419 Anxiety disorder, unspecified: Secondary | ICD-10-CM

## 2020-01-23 DIAGNOSIS — T1491XA Suicide attempt, initial encounter: Secondary | ICD-10-CM

## 2020-01-23 DIAGNOSIS — F32A Depression: Secondary | ICD-10-CM

## 2020-01-23 DIAGNOSIS — J020 Streptococcal pharyngitis: Secondary | ICD-10-CM

## 2020-01-23 MED ORDER — ONDANSETRON 4 MG DISINTEGRATING TABLET
4 mg | Freq: Once | ORAL | Status: CP
Start: 2020-01-23 — End: ?
  Administered 2020-01-23: 21:00:00 4 mg via ORAL

## 2020-01-23 MED ORDER — ONDANSETRON 4 MG DISINTEGRATING TABLET
4 mg | Status: DC
Start: 2020-01-23 — End: 2020-01-24

## 2020-01-23 MED ORDER — QUETIAPINE IMMEDIATE RELEASE 50 MG TABLET
50 mg | Freq: Every evening | ORAL | Status: AC
Start: 2020-01-23 — End: ?

## 2020-01-23 MED ORDER — ONDANSETRON 4 MG DISINTEGRATING TABLET
4 mg | ORAL_TABLET | Freq: Three times a day (TID) | ORAL | 1 refills | Status: AC | PRN
Start: 2020-01-23 — End: ?

## 2020-01-23 NOTE — ED Provider Notes
HistoryChief Complaint Patient presents with ? Emesis   vomiting all day and having abdominal pain, body aches and hot/cold flashes,did not have Covid vaccine  Zoe Scott is a 24 y.o. female patient who presents to the [facility] today with complaints of intermittent fevers and chills, 10 episodes of vomiting throughout the day today, abdominal pain, weakness. Denies recent travel, diarrhea, consuming new foods, and any other medical complaints at this time. Has not been fully vaccinated for COVID-19.  Past Medical History: Diagnosis Date ? Anxiety  ? Depression  ? Strep throat  ? Suicide attempt (HC Code) [facility] CODE)   April 2020: Ingestion of pills and cut wrists Past Surgical History: Procedure Laterality Date ? DENTAL SURGERY   ? HERNIA REPAIR Right age 59  RLQ area ? HERNIA REPAIR   ? TONSILLECTOMY Bilateral 2013 ? TONSILLECTOMY   Family History Problem Relation Age of Onset ? Depression Father  ? Anxiety disorder Father  ? Schizophrenia Maternal Aunt  Social History Socioeconomic History ? Marital status: Single   Spouse name: Not on file ? Number of children: Not on file ? Years of education: Not on file ? Highest education level: Not on file Tobacco Use ? Smoking status: Never Smoker ? Smokeless tobacco: Never Used Substance and Sexual Activity ? Alcohol use: No ? Drug use: No ? Sexual activity: Yes   Partners: Male   Birth control/protection: Condom Social History Narrative  05/11/19: Patient currently living with paternal grandmother and her 54 year old son in [place], [place]. Current open case with DCF from last inpatient hospitalization when she attempted suicide. 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  ED Other Social History ? Amphetamine frequency Never used Never used on 06/18/2018 ? Cannabis frequency 3 or more times/week Never used on 06/18/2018 Never used ->3 or more times/week on 12/15/2018 3 or more times/week ->Daily on 05/11/2019 Daily ->3 or more times/week on 05/24/2019 ? Cocaine frequency Never used Never used on 06/18/2018 ? Ecstasy frequency Never used Never used on 06/18/2018 ? Hallucinogen frequency Never used Never used on 06/18/2018 ? Inhalant frequency Never used Never used on 06/18/2018 ? Opiate frequency Never used Never used on 06/18/2018 ? Sedative frequency Never used Never used on 06/18/2018 ? Other drug frequency Never used Never used on 06/18/2018 E-cigarette/Vaping Substances E-cigarette/Vaping Devices Review of Systems Constitutional: Positive for chills and fever. Gastrointestinal: Positive for abdominal pain, nausea and vomiting. Negative for diarrhea. Neurological: Positive for weakness.  Physical ExamED Triage Vitals [01/23/20 1520]BP: 108/74Pulse: (!) 94Pulse from  O2 sat: n/aResp: 20Temp: 98.4 ?F (36.9 ?C)Temp src: n/aSpO2: 98 % BP 108/74  - Pulse (!) 94  - Temp 98.4 ?F (36.9 ?C)  - Resp 20  - SpO2 98% Physical ExamVitals and nursing note reviewed. Constitutional:     General: She is not in acute distress.   Appearance: Normal appearance. She is not ill-appearing, toxic-appearing or diaphoretic. HENT:    Head: Normocephalic and atraumatic.    Nose: Nose normal.    Mouth/Throat:    Mouth: Mucous membranes are moist.    Pharynx: Oropharynx is clear. No oropharyngeal exudate or posterior oropharyngeal erythema. Eyes:    General: No scleral icterus.   Conjunctiva/sclera: Conjunctivae normal. Cardiovascular:    Rate and Rhythm: Normal rate and regular rhythm.    Heart sounds: Normal heart sounds. Pulmonary:    Effort: Pulmonary effort is normal. No respiratory distress.    Breath sounds: Normal breath sounds. No decreased breath  sounds, wheezing, rhonchi or rales. Abdominal:    General: Bowel sounds are normal.    Palpations: Abdomen is soft.    Tenderness: There is no abdominal tenderness. There is no right CVA tenderness, left CVA tenderness, guarding or rebound. Musculoskeletal:    Cervical back: Normal range of motion. Skin:   General: Skin is warm and dry. Neurological:    General: No focal deficit present.    Mental Status: She is alert and oriented to person, place, and time. Psychiatric:       Mood and Affect: Mood normal.       Behavior: Behavior normal.  ProceduresProcedures ED COURSEReviewed previous: previous chartInterpreted by ED Provider: pulse oximetryPatient Reevaluation: [name] is a 24 y.o. female patient who presents to the [facility] today with complaints of intermittent fevers and chills, 10 episodes of vomiting throughout the day today, abdominal pain, weakness. Denies recent travel, diarrhea, consuming new foods, and any other medical complaints at this time. Has not been fully vaccinated for COVID-19. Plan:COVID-19 and rapid influenza ordered and collected will evaluate. 4:32 PMZOFRAN administered, will evaluate. 5:15 PMPatient admits to feeling better after [facility]. Drank two cups of water and was able to keep fluid down. Instructed pt GN:FAOZHY take Zofran as prescribed and drink plenty of fluids.  Your COVID test and influenza test will result within 24 hours.  If you are positive for COVID you must quarantine for full 10 days.  If her symptoms greatly worsen and you continue have protracted vomiting please follow-up to the EDPatient expresses understanding to the following instructions and was discharged. QM:VHQION prescribed. Diagnosis:The primary encounter diagnosis was Intractable vomiting with nausea, unspecified vomiting type. A diagnosis of Viral respiratory infection was also pertinent to this visit.Patient progress: stableClinical Impressions as of Jan 22 1718 Intractable vomiting with nausea, unspecified vomiting type Viral respiratory infection - COVID-19 not excluded  ED DispositionDischargeBy signing my name below, I, Erling Cruz, attest that this documentation has been prepared under the direction and in the presence of Tresa Endo, Erika11/14/21 1719This chart was generated with the assistance of a scribe. I was present and performed the services documented. I have reviewed the document and verified its accuracy.  Idalia Needle, MD11/14/21 [id number]

## 2020-01-23 NOTE — ED Notes
Presents with vomiting all day today, Dr Hermina Staggers in to see and examine, pt vomited x 1 while in the room, Zofran po x 1 given and was able to tolerate 2 glasses of water prior to discharge

## 2020-01-23 NOTE — Discharge Instructions
Novel Coronavirus Disease 2019 (COVID-19)Please take Zofran as prescribed and drink plenty of fluids.  Your COVID test and influenza test will result within 24 hours.  If you are positive for COVID you must quarantine for full 10 days.  If her symptoms greatly worsen and you continue have protracted vomiting please follow-up to the EDFor any questions, please call [facility] COVID-19 Call Center at [phone], or toll-free at [phone].  Key PointsStay at home if you are sick. If you have questions about home quarantine or COVID-19, call the COVID-19 Call Center: [phone]. Please call ahead before visiting your doctor or any other healthcare facility.Wash your hands and cover your cough or sneeze. This is the best way to prevent spreading the disease. You can use a surgical facemask or cough/sneeze into your elbow. N-95 masks are not more effective and not necessary unless you are a Research scientist (physical sciences). Using or collecting N95 masks depletes critical supplies for hospitalized patients and healthcare workers. Seek further testing if instructed by your doctor. Call your doctor or the COVID-19 Call Center at [phone] for guidance. If you go to another emergency room or clinic for testing without guidance, you risk exposing yourself and others. You can self schedule by going to [email or web address] you were prescribed medications, please take them as directed. You do not need antibiotics for COVID-19 treatment. If you are already taking medications at home, continue to take them as instructed by your prescribing physician(s).Return to the emergency room if you develop difficulty breathing or other severe symptoms. Call 911 for guidance first, mention your worsening COVID-19 symptoms, and return by private car or ambulance. Avoid using public transportation to limit exposure to others. Do NOT visit another healthcare facility without speaking with 911, your doctor or the COVID-19 Call Center.  Novel Coronavirus Disease 2019 (COVID-19)				            [id number]  ([id number])Novel Coronavirus Disease 2019 (COVID-19)What happened during my emergency department visit today?Either you or your provider were concerned about the novel coronavirus disease 2019 (COVID-19) during your visit today. This packet contains information and guidance about COVID-19. It also includes the names of your emergency room team, all the tests that were ordered, any available results, and a list of treatments given (if any) during your visit. Please note that your emergency room diagnosis is preliminary and may not explain all your symptoms since it is based on just a single visit. Your best chance of recovery is to continue safe healthcare practices and maintain contact with the appropriate providers for further guidance and follow-up as instructed in this packet. You should also take your prescribed medications and undergo any additional testing recommended by your physician(s). If these instructions do not make sense to you or your caregiver(s) or if you have any questions, please call [phone] or toll-free, [phone].What is COVID-19?COVID-19 stands for ?coronavirus disease 2019.? This is a respiratory illness that can spread from person to person. Like the flu or common cold, COVID-19 is spread by droplets produced by infected persons when they sneeze or cough. The risk of infection is thought to be greatest when you are in prolonged close contact with an infected person (such as a family member). While it may also be possible to catch the virus by touching an infected surface and then touching your nose or mouth, this is probably not the primary way that the virus spreads. Because of this, practicing social distancing (such as avoiding travel, standing at least 6 feet away from other people, and avoiding  shaking hands) is recommended. Masks should be worn in public, and in your home if you are unable to isolate yourself from others in your home.  Unlike the flu or common cold, the symptoms of COVID-19 tend to be more gradual. The most common symptoms are fever, cough and body aches. The elderly, immunocompromised and people with lung or heart disease are at the highest risk of becoming very ill. Is there any treatment for Covid-19?Currently there are some medications that can be prescribed for the treatment of COVID-19 for eligible patients. Please call your doctor or the COVID-19 call center [phone]) to determine if you are eligible for a treatment referral. Antibiotics do not treat COVID-19 since it is viral, not bacterial. Most young and otherwise healthy people will get better at home in 1-2 weeks with supportive care, which includes rest, hydration and medicine to lower your fever such as acetaminophen (Tylenol) and ibuprofen (Motrin, Advil).What are my next steps?If you have further questions, call the [facility] COVID-19 Call Center at [phone] or toll-free at [phone]. Get the flu vaccine or a booster if you do not have one yet. Encourage your family members to do the same, especially if they are elderly.Stay at home if you are sick. If you need further guidance on home quarantine or have questions about COVID-19, call COVID-19 Call Center. Please call ahead before visiting your doctor.Wash your hands and cover your cough or sneeze. You can use a surgical facemask or cough/sneeze into your elbow. Dispose of tissues in a lined trash can and immediately wash your hands for at least 20 seconds. If soap and water are not available, you can use an alcohol-based hand sanitizer containing 60-95% alcohol, rubbing your hands together until they are dry. Soap and water are recommended for visibly soiled hands. Do not share dishes, bedding or other household items with others unless thoroughly washed. This is the best way to prevent spreading the disease. N-95 masks are NOT more effective than surgical facemasks and are only recommended for healthcare workers. Using or collecting N95 masks depletes critical supplies for hospitalized patients and healthcare workers. Seek further testing if instructed by your doctor. Call your doctor or the COVID-19 Call Center at [phone] for guidance. If you go to another emergency room or clinic for testing without guidance, you risk exposing yourself and others. You can self schedule by going to [email or web address] NOT visit another healthcare facility without first speaking with your doctor or our follow-up office. Contact your primary care doctor to evaluate your progress and recovery if instructed to do so. If you do not have a primary care doctor, you can call [phone] for a [facility] physician, or [phone] for more information or assistance in selecting a doctor. [facility] members should call the [facility] COVID-19 hotline at [phone].Please avoid social stigma or discrimination against Asian Americans, infected individuals and those under quarantine. Stigma hurts everyone by creating fear, anger and anxiety towards ordinary people without treating or preventing the underlying disease.What should I watch out for in the next few days?Currently, there is no approved treatment for COVID-19. If you start to develop any of the symptoms listed below, or your current symptoms worsen, call your healthcare provider or the COVID-19 Call Center for guidance. Call 911 if it is a medical emergency. Worsening fatigue/malaiseShortness of breathInability to keep fluids downNoticing darkened or decreased urine outputConfusion or altered mental statusAny other concerning symptomsWhere can I find more information about COVID-19?Since this is a new  disease caused by a new virus, scientists and researchers are discovering new information every day. We strongly encourage you to check for updates from these official sources:The Centers for Disease Control and Prevention (CDC) COVID-19 https://dennis.info/ World Health Organization (WHO) Q&A on COVID-19https://www.who.int/news-room/q-a-detail/q-a-coronavirusesYale [facility] http://morrow-[email or web address]/ [facility] COVID-19 Vaccination GenitalDoctor.no.aspxY[employer] [email or web address].com.br

## 2020-01-24 LAB — INFLUENZA TYPE A/B RNA     (BH GH LMW Q YH)
BKR INFLUENZA A: NEGATIVE
BKR INFLUENZA B: NEGATIVE

## 2020-01-24 LAB — SARS COV-2 (COVID-19) RNA-~~LOC~~ LABS (BH GH LMW YH): BKR SARS-COV-2 RNA (COVID-19) (YH): NEGATIVE

## 2020-03-16 ENCOUNTER — Inpatient Hospital Stay: Admit: 2020-03-16 | Discharge: 2020-03-16 | Payer: MEDICAID

## 2020-03-16 MED ORDER — IBUPROFEN 600 MG TABLET
600 mg | ORAL_TABLET | Freq: Four times a day (QID) | ORAL | 1 refills | Status: AC | PRN
Start: 2020-03-16 — End: 2021-01-31

## 2020-03-16 MED ORDER — IBUPROFEN 600 MG TABLET
600 mg | Freq: Once | ORAL | Status: DC
Start: 2020-03-16 — End: 2020-03-16

## 2020-03-16 MED ORDER — ACETAMINOPHEN 325 MG TABLET
325 mg | Freq: Once | ORAL | Status: CP
Start: 2020-03-16 — End: ?
  Administered 2020-03-16: 14:00:00 325 mg via ORAL

## 2020-03-16 MED ORDER — AZITHROMYCIN 250 MG TABLET
250 mg | ORAL_TABLET | 1 refills | Status: AC
Start: 2020-03-16 — End: ?

## 2020-03-17 LAB — SARS COV-2 (COVID-19) RNA: BKR SARS-COV-2 RNA (COVID-19) (YH): POSITIVE — AB

## 2020-03-17 LAB — INFLUENZA TYPE A/B RNA     (BH GH LMW Q YH)
BKR INFLUENZA A: NEGATIVE
BKR INFLUENZA B: NEGATIVE

## 2020-03-17 NOTE — Other
Received positive COVID19 result and seen by patient.  Patient message sent with quarantine and infection control practices.

## 2020-03-19 NOTE — ED Provider Notes
This patient was seen in triage in the context of a global pandemic with this hospital at surge capacity in disaster statusHistoryChief Complaint Patient presents with ? Cough   cough x 1 day, chest pain front and back, pt concerned about pneumonia and covid  HPI 25 year old female presents emergency department complaining of cough and chest tightness.  Patient states that she has had several days of symptoms.  She has had some chills but no recorded fever.  No abdominal pain, nausea, vomiting.  No lower extremity pain or swelling.  Patient believes she has bronchitis or possibly COVID. Past Medical History: Diagnosis Date ? Anxiety  ? Depression  ? Strep throat  ? Suicide attempt (HC Code) [facility] CODE)   April 2020: Ingestion of pills and cut wrists Past Surgical History: Procedure Laterality Date ? DENTAL SURGERY   ? HERNIA REPAIR Right age 69  RLQ area ? HERNIA REPAIR   ? TONSILLECTOMY Bilateral 2013 ? TONSILLECTOMY   Family History Problem Relation Age of Onset ? Depression Father  ? Anxiety disorder Father  ? Schizophrenia Maternal Aunt  Social History Socioeconomic History ? Marital status: Single   Spouse name: Not on file ? Number of children: Not on file ? Years of education: Not on file ? Highest education level: Not on file Tobacco Use ? Smoking status: Never Smoker ? Smokeless tobacco: Never Used Substance and Sexual Activity ? Alcohol use: No ? Drug use: No ? Sexual activity: Yes   Partners: Male   Birth control/protection: Condom Social History Narrative  05/11/19: Patient currently living with paternal grandmother and her 33 year old son in [place], [place]. Current open case with DCF from last inpatient hospitalization when she attempted suicide. 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  ED Other Social History ? Amphetamine frequency Never used Never used on 06/18/2018 ? Cannabis frequency 3 or more times/week Never used on 06/18/2018 Never used ->3 or more times/week on 12/15/2018 3 or more times/week ->Daily on 05/11/2019 Daily ->3 or more times/week on 05/24/2019 ? Cocaine frequency Never used Never used on 06/18/2018 ? Ecstasy frequency Never used Never used on 06/18/2018 ? Hallucinogen frequency Never used Never used on 06/18/2018 ? Inhalant frequency Never used Never used on 06/18/2018 ? Opiate frequency Never used Never used on 06/18/2018 ? Sedative frequency Never used Never used on 06/18/2018 ? Other drug frequency Never used Never used on 06/18/2018 E-cigarette/Vaping Substances E-cigarette/Vaping Devices Review of Systems Constitutional: Negative for chills and fever. HENT: Negative for congestion, rhinorrhea and sore throat.  Eyes: Negative for visual disturbance. Respiratory: Positive for cough. Negative for shortness of breath.  Cardiovascular: Negative for chest pain. Gastrointestinal: Negative for abdominal pain, nausea and vomiting. Genitourinary: Negative for dysuria, frequency and urgency. Musculoskeletal: Negative for back pain, gait problem, neck pain and neck stiffness. Skin: Negative for rash. Neurological: Negative for dizziness, weakness and headaches. Hematological: Negative for adenopathy.  Physical ExamED Triage Vitals [03/16/20 0814]BP: 98/67Pulse: 80Pulse from  O2 sat: n/aResp: 20Temp: 99.2 ?F (37.3 ?C)Temp src: OralSpO2: 99 % BP 102/68  - Pulse 74  - Temp 97.9 ?F (36.6 ?C) (Temporal)  - Resp 17  - Wt 65 kg (143 lb 4.8 oz)  - SpO2 100%  - BMI 24.60 kg/m? Physical ExamVitals and nursing note reviewed. Constitutional:     General: She is not in acute distress.   Appearance: Normal appearance. She is well-developed. HENT:    Head: Normocephalic.    Mouth/Throat:  Mouth: Mucous membranes are moist. Cardiovascular: Rate and Rhythm: Normal rate. Pulmonary:    Effort: Pulmonary effort is normal.    Breath sounds: Normal breath sounds. Abdominal:    General: Abdomen is flat. Musculoskeletal:       General: Normal range of motion. Skin:   General: Skin is dry. Neurological:    Mental Status: She is alert and oriented to person, place, and time. Psychiatric:       Mood and Affect: Mood normal.  ProceduresProcedures ED Course COVID influenza swabs were obtained.  Patient was quite insistent that she needed antibiotic so prescription was given but she was advised that if her COVID test was positive that she should not fill the prescription.She will quarantine to her results are known.  She will follow up with her PMD and she was educated on return precautions.Clinical Impressions as of Mar 16 1209 Bronchitis  ED DispositionDischarge Johny Shears, MD01/08/22 2219

## 2020-05-12 ENCOUNTER — Encounter (HOSPITAL_BASED_OUTPATIENT_CLINIC_OR_DEPARTMENT_OTHER): Payer: Self-pay | Admitting: Family Medicine

## 2020-05-12 ENCOUNTER — Ambulatory Visit: Payer: BC Managed Care – PPO | Attending: Family Medicine | Admitting: Family Medicine

## 2020-05-12 ENCOUNTER — Ambulatory Visit (HOSPITAL_BASED_OUTPATIENT_CLINIC_OR_DEPARTMENT_OTHER): Payer: BC Managed Care – PPO | Admitting: Family Medicine

## 2020-05-12 DIAGNOSIS — R21 Rash and other nonspecific skin eruption: Secondary | ICD-10-CM | POA: Insufficient documentation

## 2020-05-12 NOTE — Progress Notes (Signed)
SUBJECTIVE: Lenice Koper is a 25 year old female for televisit - would like appt with dermatologist.  Had facial and provider there mentioned that it was concerning and she should see a dermatologist.    Red area on right cheek there for about 2 months.  Not itchy, not painful.  Not scaly or flaky.    Uses no moisturizers, washes with water only at home, and uses body wash in shower for face and body.  No other skin concerns.    OBJECTIVE: Televisit - the patient sounds well, alert and interactive.  Breathing and speech are not labored.  Speech is fluent  She appears well, resolution on vido visit does not allow for thorough eval of skin problem, but right lower cheek does appear slightly erythematous    After visit pt uploaded photos which show an erythematous macule over the right lower cheek, ovoid, no central clearing, not well marginated, no clear scale however appears a bit dry.    (R21) Skin rash  Comment: 2 mos of erythematous area on right cheek without other features or sx.  Could be dry skin, chemical irritation from body wash, mild eczema.  Unlikely to be anything serious given symptoms overall  Plan: pt to send higher resolution photos, from there can consider telederm appt or recommdnations from here.    Addendum - on review of photos, no concerning features for infection or malignant process, most likely eczema vs dry skin.  REcommend use only unscented uncolored soaps, consider a noncomedogenic moisturiser that is for sensitive skin (could consider cetaphil) and call prn.    More than 20 minutes spent on this encounter, which may include patient interview and exam, history gathering from a third party, review of specialist documentation, review of outside records, review of test results, case coordination,  patient education and counseling, and documentation.

## 2020-05-12 NOTE — Telephone Encounter (Signed)
Routing photos post televisit that Dr.Miller requested to her at this time for review.  Esaw Dace, RN 05/12/20 11:26 AM

## 2020-06-14 ENCOUNTER — Other Ambulatory Visit (HOSPITAL_BASED_OUTPATIENT_CLINIC_OR_DEPARTMENT_OTHER): Payer: Self-pay | Admitting: Family Medicine

## 2020-10-27 ENCOUNTER — Other Ambulatory Visit (HOSPITAL_BASED_OUTPATIENT_CLINIC_OR_DEPARTMENT_OTHER): Payer: Self-pay | Admitting: Family Medicine

## 2020-10-27 ENCOUNTER — Encounter (HOSPITAL_BASED_OUTPATIENT_CLINIC_OR_DEPARTMENT_OTHER): Payer: Self-pay | Admitting: Family Medicine

## 2020-10-27 MED ORDER — KETOCONAZOLE 2 % EX CREA
TOPICAL_CREAM | Freq: Every day | CUTANEOUS | 0 refills | Status: DC
Start: 2020-10-27 — End: 2020-11-02

## 2020-10-30 ENCOUNTER — Other Ambulatory Visit (HOSPITAL_BASED_OUTPATIENT_CLINIC_OR_DEPARTMENT_OTHER): Payer: Self-pay | Admitting: Family Medicine

## 2020-10-30 DIAGNOSIS — R87612 Low grade squamous intraepithelial lesion on cytologic smear of cervix (LGSIL): Secondary | ICD-10-CM

## 2020-10-30 MED ORDER — NORETHINDRONE ACET-ETHINYL EST 1-20 MG-MCG PO TABS
1.0000 | ORAL_TABLET | Freq: Every day | ORAL | 0 refills | Status: DC
Start: 2020-10-30 — End: 2021-11-22

## 2020-10-30 NOTE — Telephone Encounter (Signed)
PER Patient (self), Dawn Merritt is a 25 year old female has requested a refill of      - JUNEL 1/20 1-20 MG-MCG per tablet        Last Office Visit: 05/12/20 with PCP  Last Physical Exam: 09/08/2019     There are no preventive care reminders to display for this patient.     Other Med Adult:  Most Recent BP Reading(s)  12/29/19 : 115/75        Cholesterol (mg/dL)   Date Value   09/08/2019 187     LOW DENSITY LIPOPROTEIN DIRECT (mg/dL)   Date Value   09/08/2019 106     HIGH DENSITY LIPOPROTEIN (mg/dL)   Date Value   09/08/2019 65     TRIGLYCERIDES (mg/dL)   Date Value   09/08/2019 71         No results found for: TSHSC      No results found for: TSH    HEMOGLOBIN A1C (%)   Date Value   09/08/2019 4.5       No results found for: POCA1C      No results found for: INR    SODIUM (mmol/L)   Date Value   04/23/2016 142       POTASSIUM (mmol/L)   Date Value   04/23/2016 3.9           CREATININE (mg/dL)   Date Value   04/23/2016 0.5        Documented patient preferred pharmacies:    [facility] - [place] [place] [address]  Phone: [phone] Fax: [id number] - [place], [place] - [address]  Phone: [phone] Fax: [phone]

## 2020-11-02 MED ORDER — KETOCONAZOLE 2 % EX CREA
TOPICAL_CREAM | Freq: Every day | CUTANEOUS | 0 refills | Status: AC
Start: 2020-11-02 — End: 2020-12-02

## 2020-11-03 MED FILL — KETOCONAZOLE CRE 2%: 30 days supply | Qty: 120 | Fill #0 | Status: CP

## 2020-12-01 ENCOUNTER — Inpatient Hospital Stay: Admit: 2020-12-01 | Discharge: 2020-12-01 | Payer: MEDICAID | Primary: Cardiovascular Disease

## 2020-12-01 DIAGNOSIS — R059 Cough, unspecified: Secondary | ICD-10-CM

## 2020-12-01 DIAGNOSIS — R0981 Nasal congestion: Secondary | ICD-10-CM

## 2020-12-01 DIAGNOSIS — Z20822 Contact with and (suspected) exposure to covid-19: Secondary | ICD-10-CM

## 2020-12-01 DIAGNOSIS — J029 Acute pharyngitis, unspecified: Secondary | ICD-10-CM

## 2020-12-01 LAB — SARS COV-2 (COVID-19) RNA- REFERENCE LAB (BH GH LMW Q YH): BKR SARS-COV-2 RNA (COVID-19) (YH): NEGATIVE

## 2020-12-29 ENCOUNTER — Encounter (HOSPITAL_BASED_OUTPATIENT_CLINIC_OR_DEPARTMENT_OTHER): Payer: Self-pay | Admitting: Family Medicine

## 2021-02-05 ENCOUNTER — Other Ambulatory Visit: Payer: Self-pay

## 2021-02-05 ENCOUNTER — Encounter (HOSPITAL_BASED_OUTPATIENT_CLINIC_OR_DEPARTMENT_OTHER): Payer: Self-pay | Admitting: Family Medicine

## 2021-02-05 ENCOUNTER — Ambulatory Visit: Payer: BC Managed Care – PPO | Attending: Family Medicine | Admitting: Family Medicine

## 2021-02-05 VITALS — BP 124/76 | HR 81 | Ht 64.0 in | Wt 205.6 lb

## 2021-02-05 DIAGNOSIS — Z0000 Encounter for general adult medical examination without abnormal findings: Secondary | ICD-10-CM | POA: Insufficient documentation

## 2021-02-05 DIAGNOSIS — Z6835 Body mass index (BMI) 35.0-35.9, adult: Secondary | ICD-10-CM | POA: Diagnosis not present

## 2021-02-05 NOTE — Progress Notes (Signed)
[facility] OFFICE VISIT NOTE    NAME: Dawn Merritt  MRN: [id number]  DOB: 08-Sep-1995  PCP: [name], MD  _______________________________________________________________________________  ASSESSMENT AND PLAN    Problem List        Low    BMI 35.0-35.9,adult    Overview     09/08/19 reviewed weight trajectory and what helped in past.  Will work on portion control and a goal of 1 hour of exercise daily.         Current Assessment & Plan     Encourage exercise, BMI has gone down. Lipid and A1c quite nomal 08/2019 so dont think warranted to repeat at this point.          Diagnoses and all orders for this visit:    Health care maintenance    BMI 35.0-35.9,adult         Goals      Decrease soda or juice intake      Stopped soda, drank a lot as a child       Reduce portion size           Follow up due: q2 years, prn sooner        COVID-19 Vaccine(4 - Booster for [employer] series)  INFLUENZA VACCINE(1)    I spent a total of 356 minutes on this visit on the date of service (total time includes all activities performed on the date of service)      Hassel Neth, MD  02/05/2021   ________________________________________________________________________________    CHIEF COMPLAINT  No chief complaint on file.      HISTORY  Dawn Merritt is a 25 year old English-speaking female here for a physical exam with this physician     In addition, we reviewed  (Z00.00) Health care maintenance  (primary encounter diagnosis)  (Z68.35) BMI 35.0-35.9,adult      PROBLEM LIST  Patient Active Problem List:     BMI 35.0-35.9,adult     Family history of diabetes mellitus     Peripheral retinal degeneration, lattice     Low grade squamous intraepith lesion on cytologic smear cervix (lgsil)     History of COVID-19     Menstrual irregularity      MEDICATIONS    Current Outpatient Medications:     ulipristal acetate (ELLA) 30 MG tablet, Ella 30 mg tablet  Take 1 tablet by oral route for 1 day., Disp: , Rfl:      norethindrone-ethinyl estradiol (JUNEL 1/20) 1-20 MG-MCG per tablet, Take 1 tablet by mouth in the morning., Disp: 365 tablet, Rfl: 0    Current Facility-Administered Medications:     ibuprofen (ADVIL,MOTRIN) tablet 600 mg, 600 mg, Oral, Once, [name], MD    SURGICAL HISTORY  Past Surgical History:  No date: NO SIGNIFICANT SURGICAL HISTORY    FAMILY HISTORY  Review of patient's family history indicates:  Problem: No Known Problems      Relation: Mother          Age of Onset: (Not Specified)  Problem: Asthma      Relation: Father          Age of Onset: (Not Specified)          Comment: very mild  Problem: Diabetes      Relation: Maternal Grandmother          Age of Onset: (Not Specified)          Comment: and numerous other fmaily members/  Problem:  Heart      Relation: Maternal Grandmother          Age of Onset: (Not Specified)          Comment: CAD, CHF  Problem: Cancer - Other      Relation: Maternal Grandmother          Age of Onset: (Not Specified)          Comment: renal  Problem: Alcohol/Drug Abuse      Relation: Maternal Grandfather          Age of Onset: (Not Specified)          Comment: alocholism  Problem: Asthma      Relation: Paternal Uncle          Age of Onset: (Not Specified)  Problem: Cancer - Breast      Relation: FamHxNeg          Age of Onset: (Not Specified)  Problem: Cancer - Colon      Relation: FamHxNeg          Age of Onset: (Not Specified)  Problem: Cancer - Ovarian      Relation: FamHxNeg          Age of Onset: (Not Specified)      SOCIAL HISTORY  Social History    Tobacco Use      Smoking status: Passive Smoke Exposure - Never Smoker      Smokeless tobacco: Never      Tobacco comments: dad smoked in house    Alcohol use: Yes      Comment: 1-2 weekly    Drug use: No    Social History    Social History Narrative      08/2014 lives with parents in [place].      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, working as Education administrator asst for [employer]      02/05/2021:       Diet: healthy  meats/proteins, salads      Exercise: sedentary office job      Sleep: 7 hours, no issues    Straight (not lesbian or gay) , Female     OB HISTORY  OB History   G0  P0  T0  P0  A0  L0    SAB0  IAB0  Ectopic0  Molar0  Multiple0  Live Births0     ROS  Relevant review of systems was obtained as noted in the HPI    PHYSICAL EXAM  BP 124/76    Pulse 81    Ht 5\' 4"  (1.626 m)    Wt 93.3 kg (205 lb 9.6 oz)    SpO2 99%    BMI 35.29 kg/m   Pain Score: Data Unavailable        GEN: well appearing, NAD  HEENT: o/p benign, dentition good, neck supple no thyromegaly  RESP: normal work of breathing, normal respiratory rate, CTAB  HEART: RRR, no MRG  ABD: soft, NT, ND no rebound or guarding, no hepatosplenomegaly  MUSK: normal gait  SKIN: hypopigmentation s/p fungall infection resolving on neck, small 0.32mm nevus on chest  NEURO: A&Ox3, normal gait  CRANIAL: Olfactory not tested. EOMI. PERRL.Facial sensation is intact in all 3 divisions bilaterally.  Palate elevates symmetrically. Voice is normal. Head turning and shoulder shrug intact. Tongue protrudes to center. Finger to nose intact bilaterally. No nystagmus noted.    PSYCH: normal mood, affect. Normal thought content, linear thinking

## 2021-02-05 NOTE — Assessment & Plan Note (Signed)
Encourage exercise, BMI has gone down. Lipid and A1c quite nomal 08/2019 so dont think warranted to repeat at this point.

## 2021-02-15 ENCOUNTER — Inpatient Hospital Stay
Admit: 2021-02-15 | Discharge: 2021-02-15 | Payer: MEDICAID | Attending: Internal Medicine | Primary: Cardiovascular Disease

## 2021-02-15 ENCOUNTER — Encounter: Admit: 2021-02-15 | Payer: PRIVATE HEALTH INSURANCE | Primary: Cardiovascular Disease

## 2021-02-15 ENCOUNTER — Inpatient Hospital Stay: Admit: 2021-02-15 | Discharge: 2021-02-15 | Payer: PRIVATE HEALTH INSURANCE

## 2021-02-15 DIAGNOSIS — F32A Depression: Secondary | ICD-10-CM

## 2021-02-15 DIAGNOSIS — T1491XA Suicide attempt, initial encounter: Secondary | ICD-10-CM

## 2021-02-15 DIAGNOSIS — J020 Streptococcal pharyngitis: Secondary | ICD-10-CM

## 2021-02-15 DIAGNOSIS — Z9089 Acquired absence of other organs: Secondary | ICD-10-CM

## 2021-02-15 DIAGNOSIS — F419 Anxiety disorder, unspecified: Secondary | ICD-10-CM

## 2021-02-15 MED ORDER — FLUTICASONE PROPIONATE 50 MCG/ACTUATION NASAL SPRAY,SUSPENSION
50 mcg/actuation | 1 refills | Status: AC
Start: 2021-02-15 — End: 2021-03-16

## 2021-02-15 MED ORDER — AMOXICILLIN 875 MG-POTASSIUM CLAVULANATE 125 MG TABLET
875-125 mg | ORAL_TABLET | Freq: Two times a day (BID) | ORAL | 1 refills | Status: AC
Start: 2021-02-15 — End: ?

## 2021-02-15 MED ORDER — IBUPROFEN 600 MG TABLET
600 mg | Freq: Once | ORAL | Status: CP
Start: 2021-02-15 — End: ?
  Administered 2021-02-15: 12:00:00 600 mg via ORAL

## 2021-02-15 MED ORDER — ALBUTEROL SULFATE HFA 90 MCG/ACTUATION AEROSOL INHALER
90 mcg/actuation | 1 refills | Status: AC
Start: 2021-02-15 — End: 2021-03-16

## 2021-02-15 NOTE — ED Provider Notes
HistoryChief Complaint Patient presents with ? Sore Throat   Positive for strep throat 2 week ago, ( positive strep test at work), took prednisone and cough medicine,  no abx ,10pm last night sore throat worsened, hx of tonsillectomy  This is a 25 y.o. female who presents to [facility] with sore throat, occasional SOB, nasal congestion, and postnasal drip since last night. Also c/o cough since 2 weeks ago. Patient states she was Strep positive at her workplace 2 weeks ago. The provider there gave her Prednisone, but no antibiotics. She notes her swelling had gone down and she had felt better, but her throat pain came back last night. Denies fevers, chills, CP, abdominal pain, or NVD. No medical hx of asthma or lung problems. Patient smokes marijuana at night. She is not vaccinated against Covid or Influenza. Past Medical History: Diagnosis Date ? Anxiety  ? Depression  ? Hx of tonsillectomy 2016 ? Strep throat  ? Suicide attempt (HC Code) [facility] CODE) [facility] Code)   April 2020: Ingestion of pills and cut wrists Past Surgical History: Procedure Laterality Date ? DENTAL SURGERY   ? HERNIA REPAIR Right age 73  RLQ area ? HERNIA REPAIR   ? TONSILLECTOMY Bilateral 2013 ? TONSILLECTOMY   Family History Problem Relation Age of Onset ? Depression Father  ? Anxiety disorder Father  ? Schizophrenia Maternal Aunt  Social History Socioeconomic History ? Marital status: Single Tobacco Use ? Smoking status: Never ? Smokeless tobacco: Never Substance and Sexual Activity ? Alcohol use: No ? Drug use: No ? Sexual activity: Yes   Partners: Male   Birth control/protection: Condom Social History Narrative  05/11/19: Patient currently living with paternal grandmother and her 9 year old son in [place], [place]. Current open case with DCF from last inpatient hospitalization when she attempted suicide. Patient has high school degree and is currently attending [employer], taking nursing classes.  ED Other Social History ? Amphetamine frequency Never used Never used on 06/18/2018 ? Cannabis frequency 3 or more times/week Never used on 06/18/2018 Never used ->3 or more times/week on 12/15/2018 3 or more times/week ->Daily on 05/11/2019 Daily ->3 or more times/week on 05/24/2019 ? Cocaine frequency Never used Never used on 06/18/2018 ? Ecstasy frequency Never used Never used on 06/18/2018 ? Hallucinogen frequency Never used Never used on 06/18/2018 ? Inhalant frequency Never used Never used on 06/18/2018 ? Opiate frequency Never used Never used on 06/18/2018 ? Sedative frequency Never used Never used on 06/18/2018 ? Other drug frequency Never used Never used on 06/18/2018 E-cigarette/Vaping Substances E-cigarette/Vaping Devices Review of Systems Constitutional: Negative for chills and fever. HENT: Positive for congestion (nasal), postnasal drip and sore throat.  Respiratory: Positive for cough and shortness of breath (ocassional).  Cardiovascular: Negative for chest pain. Gastrointestinal: Negative for abdominal pain, diarrhea, nausea and vomiting. All other systems reviewed and are negative. Physical ExamED Triage Vitals [02/15/21 0814]BP: 103/63Pulse: 62Pulse from  O2 sat: n/aResp: 16Temp: 98.2 ?F (36.8 ?C)Temp src: TemporalSpO2: 100 % BP 103/63  - Pulse 62  - Temp 98.2 ?F (36.8 ?C) (Temporal)  - Resp 16  - LMP 01/16/2021  - SpO2 100% Physical ExamVitals and nursing note reviewed. Constitutional:     General: She is not in acute distress.   Appearance: Normal appearance. She is well-developed. She is not ill-appearing, toxic-appearing or diaphoretic. HENT:    Head: Normocephalic and atraumatic.    Right Ear: Tympanic membrane, ear canal and external ear normal. There is no impacted cerumen.  Left Ear: Tympanic membrane, ear canal and external ear normal. There is no impacted cerumen.    Mouth/Throat:    Mouth: Mucous membranes are moist.    Pharynx: Uvula midline. Pharyngeal swelling (airway patent) and posterior oropharyngeal erythema (Beefy red) present. No oropharyngeal exudate or uvula swelling.    Tonsils: No tonsillar exudate or tonsillar abscesses. 3+ on the right. 2+ on the left.    Comments: AIRWAY PATENTEyes:    General: No scleral icterus.   Conjunctiva/sclera: Conjunctivae normal. Cardiovascular:    Rate and Rhythm: Normal rate and regular rhythm.    Heart sounds: Normal heart sounds. No murmur heard.Pulmonary:    Effort: Pulmonary effort is normal. No respiratory distress.    Breath sounds: No stridor. Wheezing and rhonchi present. No rales. Musculoskeletal:    Cervical back: Normal range of motion and neck supple. No rigidity or tenderness. Lymphadenopathy:    Cervical: No cervical adenopathy. Skin:   General: Skin is warm and dry. Neurological:    General: No focal deficit present.    Mental Status: She is alert and oriented to person, place, and time. Psychiatric:       Mood and Affect: Mood normal.       Behavior: Behavior normal.  ProceduresProcedures ED COURSEInterpreted by ED Provider: pulse oximetry and labsPatient Reevaluation: This is a 25 y.o. female who presents to [facility] with sore throat, occasional SOB, nasal congestion, and postnasal drip since last night. Also c/o cough since 2 weeks ago. Patient states she was Strep positive at her workplace 2 weeks ago. The provider there gave her Prednisone, but no antibiotics. She notes her swelling had gone down and she had felt better, but her throat pain came back last night. Denies fevers, chills, CP, abdominal pain, or NVD. No medical hx of asthma or lung problems. Patient smokes marijuana at night. She is not vaccinated against Covid or Influenza.Plan: Labs: Covid/Influenza PCR. Rapid strep was negative. Throat culture pending. Medications: Augmentin, Flonase, and Proair HFAADVISED:STAY HOME EXCEPT FOR MEDICAL CARE PENDING YOUR PCR COVID TEST AND INFLUENZA TEST RESULTCHECK MY CHART FOR COVID AND FLU TEST RESULTS WITHIN 12 TO 24 HOURSIF POSITIVE FOR COVID THEN HOME ISOLATION FOR 5 DAYS  EXCEPT FOR MEDICAL CARE AND  INFORM CLOSE CONTACTS AND HOUSEHOLD CONTACTS IF POSITIVE FOR INFLUENZA THEN HOME ISOLATION FOR 5 DAYS EXCEPT FOR MEDICAL CARE AND YOU MUST BE FEVER FREE AND SYMPTOM FREE FOR 24 HOURS BEFORE RETURNING TO WORK OR SCHOOLCONSIDER TAMIFLU IF POSITIVE FOR INFLUENZA... BEST TIME TO TREAT FOR INFLUENZA IS IN THE FIRST 48 HOURSYOU CAN CALL THE [place] COVID HOTLINE FOR ANY QUESTIONS OR CONCERNS RELATED TO COVID [phone] POSITIVE FOR COVID PLEASE CONTACT AND INFORM YOUR PRIMARY DOCTOR FOR CONTINUITY OF CARE    RESTDRINK LOTS OF FLUIDSWARM SALTWATER GARGLES TWICE TO 3 TIMES DAILYTHROAT LOZENGESTAKE ANTIBIOTIC AS PRESCRIBED.  TAKE A PROBIOTIC 2 HOURS BEFORE OR 2 HOURS AFTER THE ANTIBIOTICTYLENOL OR MOTRIN AS NEEDED FOR FEVER OR DISCOMFORTFOLLOW UP WITH YOUR PRIMARY DOCTOR FOR RECHECK IN 4 DAYS, SOONER IF ANY WORSENING SYMPTOMSTAKE MEDICATION AS DIRECTED.  TAKE A PROBIOTIC TWO HOURS BEFORE OR 2 HOURS AFTER THE ANTIBIOTIC GO TO THE EMERGENCY ROOM WITH HIGH FEVER IS UNABLE TO SWALLOW BREATHING DIFFICULTY WEAKNESS LETHARGY OR ANY WORSENING SYMPTOMS Patient understands and is agreeable to the plan of care.Patient progress: stableClinical Impressions as of 02/15/21 0905 Acute pharyngitis, unspecified etiology Acute bronchitis with bronchospasm  ED DispositionDischargeBy signing my name below, I, Daphane Shepherd, attest that this documentation has been  prepared under the direction and in the presence of Dr. Wyatt Haste. Clifton Custard, Julianna12/08/22 0905This chart was generated with the assistance of a scribe. I was present and performed the services documented. I have reviewed the documentation and verify its accuracy. Gypsy Balsam, MD12/08/22 1426

## 2021-02-15 NOTE — Discharge Instructions
STAY HOME EXCEPT FOR MEDICAL CARE PENDING YOUR PCR COVID TEST AND INFLUENZA TEST RESULTCHECK MY CHART FOR COVID AND FLU TEST RESULTS WITHIN 12 TO 24 HOURSIF POSITIVE FOR COVID THEN HOME ISOLATION FOR 5 DAYS  EXCEPT FOR MEDICAL CARE AND  INFORM CLOSE CONTACTS AND HOUSEHOLD CONTACTS IF POSITIVE FOR INFLUENZA THEN HOME ISOLATION FOR 5 DAYS EXCEPT FOR MEDICAL CARE AND YOU MUST BE FEVER FREE AND SYMPTOM FREE FOR 24 HOURS BEFORE RETURNING TO WORK OR SCHOOLCONSIDER TAMIFLU IF POSITIVE FOR INFLUENZA... BEST TIME TO TREAT FOR INFLUENZA IS IN THE FIRST 48 HOURSYOU CAN CALL THE [place] COVID HOTLINE FOR ANY QUESTIONS OR CONCERNS RELATED TO COVID [phone] POSITIVE FOR COVID PLEASE CONTACT AND INFORM YOUR PRIMARY DOCTOR FOR CONTINUITY OF CARE    RESTDRINK LOTS OF FLUIDSWARM SALTWATER GARGLES TWICE TO 3 TIMES DAILYTHROAT LOZENGESTAKE ANTIBIOTIC AS PRESCRIBED.  TAKE A PROBIOTIC 2 HOURS BEFORE OR 2 HOURS AFTER THE ANTIBIOTICTYLENOL OR MOTRIN AS NEEDED FOR FEVER OR DISCOMFORTFOLLOW UP WITH YOUR PRIMARY DOCTOR FOR RECHECK IN 4 DAYS, SOONER IF ANY WORSENING SYMPTOMSTAKE MEDICATION AS DIRECTED.  TAKE A PROBIOTIC TWO HOURS BEFORE OR 2 HOURS AFTER THE ANTIBIOTIC GO TO THE EMERGENCY ROOM WITH HIGH FEVER IS UNABLE TO SWALLOW BREATHING DIFFICULTY WEAKNESS LETHARGY OR ANY WORSENING SYMPTOMS

## 2021-02-15 NOTE — ED Notes
9:01 AM called to room x1 no answer0915 AM called to room x2 no answer LWBS

## 2021-02-15 NOTE — ED Notes
Pt verbalized understanding of discharge instructions and recommended follow up care as per the after visit summary.  Written discharge instructions provided. Denies any further questions. Vital signs  Vital SignsTemp: 98.2 ?F (36.8 ?C)Temp src: TemporalPulse: 62Resp: 16BP: 103/63NIBP MAP (mmHg): 76BP Location: Left armBP Method: AutomaticPatient Position: SittingE. Felizardo Hoffmann, RN

## 2021-02-16 LAB — INFLUENZA TYPE A/B RNA     (BH GH LMW Q YH)
BKR INFLUENZA A: NEGATIVE
BKR INFLUENZA B: NEGATIVE

## 2021-02-16 LAB — SARS COV-2 (COVID-19) RNA: BKR SARS-COV-2 RNA (COVID-19) (YH): NEGATIVE

## 2021-02-17 LAB — THROAT CULTURE     (Q)

## 2021-02-22 ENCOUNTER — Encounter
Admit: 2021-02-22 | Payer: PRIVATE HEALTH INSURANCE | Attending: Cardiovascular Disease | Primary: Cardiovascular Disease

## 2021-02-22 ENCOUNTER — Encounter: Admit: 2021-02-22 | Payer: PRIVATE HEALTH INSURANCE | Attending: Internal Medicine | Primary: Cardiovascular Disease

## 2021-02-22 ENCOUNTER — Ambulatory Visit: Admit: 2021-02-22 | Payer: MEDICAID | Attending: Cardiovascular Disease | Primary: Cardiovascular Disease

## 2021-02-22 ENCOUNTER — Ambulatory Visit: Admit: 2021-02-22 | Payer: MEDICAID | Attending: Internal Medicine | Primary: Cardiovascular Disease

## 2021-02-22 ENCOUNTER — Inpatient Hospital Stay: Admit: 2021-02-22 | Discharge: 2021-02-22 | Payer: MEDICAID | Primary: Cardiovascular Disease

## 2021-02-22 DIAGNOSIS — F332 Major depressive disorder, recurrent severe without psychotic features: Secondary | ICD-10-CM

## 2021-02-22 DIAGNOSIS — E673 Hypervitaminosis D: Secondary | ICD-10-CM

## 2021-02-22 DIAGNOSIS — E559 Vitamin D deficiency, unspecified: Secondary | ICD-10-CM

## 2021-02-22 DIAGNOSIS — G9332 Myalgic encephalomyelitis/chronic fatigue syndrome: Secondary | ICD-10-CM

## 2021-02-22 LAB — URINALYSIS WITH CULTURE REFLEX      (BH LMW YH)
BKR BILIRUBIN, UA: NEGATIVE
BKR BLOOD, UA: NEGATIVE x 1000/??L (ref 2.00–7.60)
BKR GLUCOSE, UA: NEGATIVE x1000/??L (ref 150–420)
BKR KETONES, UA: NEGATIVE
BKR LEUKOCYTE ESTERASE, UA: NEGATIVE
BKR NITRITE, UA: NEGATIVE
BKR PH, UA: 6.5 (ref 5.5–7.5)
BKR PROTEIN, UA: NEGATIVE % (ref 11.0–15.0)
BKR SPECIFIC GRAVITY, UA: 1.02 (ref 1.005–1.030)
BKR UROBILINOGEN, UA (MG/DL): <2 mg/dL (ref <=2.0)

## 2021-02-22 LAB — UA REFLEX CULTURE

## 2021-02-22 LAB — CBC WITHOUT DIFFERENTIAL
BKR WAM ANALYZER ANC: 5.77 x 1000/ÂµL (ref 2.00–7.60)
BKR WAM HEMATOCRIT (2 DEC): 40.4 % (ref 35.00–45.00)
BKR WAM HEMOGLOBIN: 13.3 g/dL (ref 11.7–15.5)
BKR WAM MCH (PG): 28.5 pg (ref 27.0–33.0)
BKR WAM MCHC: 32.9 g/dL (ref 31.0–36.0)
BKR WAM MCV: 86.5 fL (ref 80.0–100.0)
BKR WAM MPV: 11.5 fL (ref 8.0–12.0)
BKR WAM PLATELETS: 233 x1000/ÂµL (ref 150–420)
BKR WAM RDW-CV: 12.8 % (ref 11.0–15.0)
BKR WAM RED BLOOD CELL COUNT.: 4.67 M/??L (ref 4.00–6.00)
BKR WAM WHITE BLOOD CELL COUNT: 8.2 x1000/??L (ref 4.0–11.0)

## 2021-03-29 ENCOUNTER — Encounter (HOSPITAL_BASED_OUTPATIENT_CLINIC_OR_DEPARTMENT_OTHER): Payer: Self-pay | Admitting: Family Medicine

## 2021-04-23 ENCOUNTER — Ambulatory Visit: Admit: 2021-04-23 | Payer: MEDICAID | Attending: Cardiovascular Disease | Primary: Cardiovascular Disease

## 2021-04-23 ENCOUNTER — Encounter
Admit: 2021-04-23 | Payer: PRIVATE HEALTH INSURANCE | Attending: Cardiovascular Disease | Primary: Cardiovascular Disease

## 2021-04-23 ENCOUNTER — Inpatient Hospital Stay: Admit: 2021-04-23 | Discharge: 2021-04-23 | Payer: MEDICAID

## 2021-04-23 DIAGNOSIS — R5383 Other fatigue: Secondary | ICD-10-CM

## 2021-04-23 DIAGNOSIS — Z0000 Encounter for general adult medical examination without abnormal findings: Secondary | ICD-10-CM

## 2021-04-23 LAB — COMPREHENSIVE METABOLIC PANEL
BKR A/G RATIO: 1.5 x 1000/??L (ref 1.0–2.2)
BKR ALANINE AMINOTRANSFERASE (ALT): 14 U/L (ref 10–35)
BKR ALBUMIN: 4.5 g/dL (ref 3.6–4.9)
BKR ALKALINE PHOSPHATASE: 47 U/L (ref 9–122)
BKR ANION GAP: 10 (ref 7–17)
BKR ASPARTATE AMINOTRANSFERASE (AST): 20 U/L (ref 10–35)
BKR AST/ALT RATIO: 1.4 x 1000/??L (ref 0.00–1.00)
BKR BILIRUBIN TOTAL: 0.2 mg/dL (ref 0.0–<=1.2)
BKR BLOOD UREA NITROGEN: 12 mg/dL (ref 6–20)
BKR BUN / CREAT RATIO: 17.1 (ref 8.0–23.0)
BKR CALCIUM: 9.3 mg/dL (ref 8.8–10.2)
BKR CHLORIDE: 104 mmol/L (ref 98–107)
BKR CO2: 25 mmol/L (ref 20–30)
BKR CREATININE: 0.7 mg/dL (ref 0.40–1.30)
BKR EGFR, CREATININE (CKD-EPI 2021): >60 mL/min/{1.73_m2} (ref >=60–1.00)
BKR GLOBULIN: 3 g/dL (ref 2.3–3.5)
BKR GLUCOSE: 85 mg/dL (ref 70–100)
BKR POTASSIUM: 4.1 mmol/L (ref 3.3–5.3)
BKR PROTEIN TOTAL: 7.5 g/dL (ref 6.6–8.7)
BKR SODIUM: 139 mmol/L (ref 136–144)
BKR WAM LYMPHOCYTES: 7.5 g/dL (ref 6.6–8.7)

## 2021-04-23 LAB — URIC ACID: BKR URIC ACID: 5 mg/dL (ref 2.7–7.3)

## 2021-04-23 LAB — CBC WITH AUTO DIFFERENTIAL
BKR WAM ABSOLUTE IMMATURE GRANULOCYTES.: 0.02 x 1000/??L (ref 0.00–0.30)
BKR WAM ABSOLUTE LYMPHOCYTE COUNT.: 1.84 x 1000/??L (ref 0.60–3.70)
BKR WAM ABSOLUTE NRBC (2 DEC): 0 x 1000/??L (ref 0.00–1.00)
BKR WAM ANALYZER ANC: 4.49 x 1000/??L (ref 2.00–7.60)
BKR WAM BASOPHIL ABSOLUTE COUNT.: 0.04 x 1000/??L (ref 0.00–1.00)
BKR WAM BASOPHILS: 0.6 % (ref 0.0–1.4)
BKR WAM EOSINOPHIL ABSOLUTE COUNT.: 0.23 x 1000/??L (ref 0.00–1.00)
BKR WAM EOSINOPHILS: 3.3 % (ref 0.0–5.0)
BKR WAM HEMATOCRIT (2 DEC): 42 % (ref 35.00–45.00)
BKR WAM HEMOGLOBIN: 14 g/dL (ref 11.7–15.5)
BKR WAM IMMATURE GRANULOCYTES: 0.3 % (ref 0.0–1.0)
BKR WAM MCH (PG): 28.5 pg (ref 27.0–33.0)
BKR WAM MCHC: 33.3 g/dL (ref 31.0–36.0)
BKR WAM MCV: 85.4 fL (ref 80.0–100.0)
BKR WAM MONOCYTE ABSOLUTE COUNT.: 0.39 x 1000/??L (ref 0.00–1.00)
BKR WAM MONOCYTES: 5.6 % (ref 4.0–12.0)
BKR WAM MPV: 11.6 fL (ref 8.0–12.0)
BKR WAM NEUTROPHILS: 64 % (ref 39.0–72.0)
BKR WAM NUCLEATED RED BLOOD CELLS: 0 % (ref 0.0–1.0)
BKR WAM PLATELETS: 222 x1000/??L (ref 150–420)
BKR WAM RDW-CV: 13.4 % (ref 11.0–15.0)
BKR WAM RED BLOOD CELL COUNT.: 4.92 M/ÂµL (ref 4.00–6.00)
BKR WAM WHITE BLOOD CELL COUNT: 7 x1000/??L (ref 4.0–11.0)

## 2021-04-23 LAB — TOTAL PROTEIN AND ALBUMIN,SERUM  (YH)
BKR ALBUMIN, SERUM: 4.6 g/dL (ref 3.6–4.9)
BKR TOTAL PROTEIN, SERUM: 7 g/dL (ref 6.0–8.3)

## 2021-04-23 LAB — SEDIMENTATION RATE (ESR): BKR SEDIMENTATION RATE, ERYTHROCYTE: 14 mm/hr (ref 0–20)

## 2021-04-23 LAB — TSH: BKR THYROID STIMULATING HORMONE: 0.6 ??IU/mL

## 2021-04-23 LAB — RHEUMATOID FACTOR: BKR RHEUMATOID FACTOR: <10 IU/mL (ref <14.0)

## 2021-04-23 LAB — FERRITIN: BKR FERRITIN: 20 ng/mL (ref 13–150)

## 2021-04-23 LAB — VITAMIN B12: BKR VITAMIN B12: 737 pg/mL (ref 232–1245)

## 2021-04-23 LAB — C-REACTIVE PROTEIN     (CRP): BKR C-REACTIVE PROTEIN, HIGH SENSITIVITY: 3.1 mg/L — ABNORMAL HIGH

## 2021-04-24 LAB — HIV-1/HIV-2 ANTIBODY/ANTIGEN SCREEN W/REFLEX     (BH GH LMW YH): BKR HIV 1/2 ANTIBODY SCREEN (BH): NONREACTIVE

## 2021-04-24 LAB — QUANTIFERON-TB
BKR QUANTIFERON-TB GOLD IN-TUBE: NEGATIVE
BKR QUANTIFERON-TB MITOGEN MINUS NIL: 0.81 [IU]/mL
BKR QUANTIFERON-TB NIL: 0 [IU]/mL
BKR QUANTIFERON-TB1 MINUS NIL: 0 IU/mL (ref <0.35)
BKR QUANTIFERON-TB2 MINUS NIL: 0.03 IU/mL (ref <0.35)

## 2021-04-24 LAB — LYME ANTIBODIES W/RFLX TO CONFIRM (MODIFIED TWO-TIER TESTING)    (BH GH LMW YH)
BKR LYME TOTAL ANTIBODY INTERPRETATION: NEGATIVE mg/L — ABNORMAL HIGH
BKR LYME TOTAL ANTIBODY: 0.18 Index

## 2021-04-24 LAB — HEPATITIS C AB WITH REFLEX TO HCV PCR
BKR HEPATITIS C ANTIBODY INITIAL RESULT: 0.03 S/CO
BKR HEPATITIS C ANTIBODY INTERPRETATION: NONREACTIVE

## 2021-04-24 LAB — HEPATITIS B SURFACE ANTIGEN     (BH GH L LMW YH): BKR HEPATITIS B SURFACE ANTIGEN INTERPRETATION: NONREACTIVE

## 2021-04-24 LAB — TREPONEMA PALLIDUM (SYPHILIS) ANTIBODY W/REFLEX
BKR TREPONEMA PALLIDUM ANTIBODY INITIAL RESULT: <0.1 Index (ref <0.9)
BKR TREPONEMA PALLIDUM ANTIBODY TOTAL, SERUM: NONREACTIVE

## 2021-04-24 LAB — CYCLIC CITRUL PEPTIDE ANTIBODY, IGG: BKR CCP AB, IGG: 1.3 EliA U/mL (ref <7.0)

## 2021-04-24 LAB — VITAMIN D, 25-HYDROXY: BKR VITAMIN D 25-HYDROXY: 25.1 ng/mL — ABNORMAL LOW (ref 30.0–100.0)

## 2021-04-25 LAB — ANA IFA SCREEN W/REFL TO TITER AND PATTERN, IFA: BKR ANTI-NUCLEAR ANTIBODY (ANA): NEGATIVE

## 2021-04-25 LAB — CARDIOLIPIN ABS, IGA, IGG, IGM     (BH GH LMW Q YH)
BKR CARDIOLIPIN IGA: 1.9 (ref 0.0–22.0)
BKR CARDIOLIPIN IGG: 16.2 (ref 0.0–23)
BKR CARDIOLIPIN IGM: 8.4 (ref 0.0–11.0)

## 2021-04-25 LAB — DNA ANTIBODY, DOUBLE-STRANDED: BKR DSDNA ANTIBODY: <12.3 IU/mL (ref <30)

## 2021-04-27 LAB — TISSUE TRANSGLUTAMINASE, IGA: TTG IGA AB: <1 U/mL (ref <15.0)

## 2021-04-27 LAB — SMITH/RNP ANTIBODY (BH GH L Q): SM/RNP ANTIBODY: <1

## 2021-04-27 LAB — TISSUE TRANSGLUTAMINASE, IGG     (BH GH LMW Q YH): TISSUE TRANSGLUTAMINASE IGG AB: <1 U/mL (ref <15.0)

## 2021-04-27 LAB — SJOGREN'S ANTIBODIES (SS-A,SS-B)
SJOGREN'S ANTIBODY (SS-A): <1
SJOGREN'S ANTIBODY (SS-B): <1

## 2021-04-27 LAB — SCLERODERMA (SCL-70) ANTIBODY: SCL-70 ANTIBODY: <1 (ref 0.0–22.0)

## 2021-04-30 LAB — PROTEIN ELECTROPHORESIS, SERUM    (YH)
BKR ALBUMIN ELP: 4.7 g/dL (ref 3.50–4.70)
BKR ALPHA-1-GLOBULIN: 0.15 g/dL (ref 0.10–0.30)
BKR ALPHA-2-GLOBULIN: 0.61 g/dL (ref 0.60–1.00)
BKR BETA GLOBULIN: 0.78 g/dL (ref 0.70–1.20)
BKR GAMMA GLOBULIN: 0.77 g/dL (ref 0.70–1.50)

## 2021-05-09 ENCOUNTER — Encounter (HOSPITAL_BASED_OUTPATIENT_CLINIC_OR_DEPARTMENT_OTHER): Payer: Self-pay | Admitting: Family Medicine

## 2021-05-13 DIAGNOSIS — N760 Acute vaginitis: Secondary | ICD-10-CM | POA: Diagnosis not present

## 2021-06-12 ENCOUNTER — Encounter (HOSPITAL_BASED_OUTPATIENT_CLINIC_OR_DEPARTMENT_OTHER): Payer: Self-pay | Admitting: Family Medicine

## 2021-08-20 ENCOUNTER — Encounter (HOSPITAL_BASED_OUTPATIENT_CLINIC_OR_DEPARTMENT_OTHER): Payer: Self-pay | Admitting: Family Medicine

## 2021-08-22 ENCOUNTER — Ambulatory Visit: Payer: BC Managed Care – PPO | Attending: Family Medicine | Admitting: Family Medicine

## 2021-08-22 DIAGNOSIS — L0882 Omphalitis not of newborn: Secondary | ICD-10-CM | POA: Diagnosis not present

## 2021-08-22 MED ORDER — MUPIROCIN 2 % EX OINT
TOPICAL_OINTMENT | Freq: Two times a day (BID) | CUTANEOUS | 0 refills | Status: AC
Start: 2021-08-22 — End: 2021-08-29

## 2021-08-22 NOTE — Progress Notes (Signed)
SUBJECTIVE: Dawn Merritt is a 26 year old female for televisit - noticing some brown discharge that does not smell good from her umbilicus.  This recurred the next day, with some slight itching.  Mom also noticed some slight odor.  Not painful at all.    No fever, no changes in PO intake.  Feeling fine.    Currently just using soap and water in the area to clean it but the smelly discharge returns.    OBJECTIVE: Televisit - the patient sounds well, alert and interactive.  Breathing and speech are not labored.  Speech is fluent  She appears well.    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  No systemic symptoms.  Plan: mupirocin (BACTROBAN) 2 % ointment        The use, side effects  and expected effects of this medication are reviewed with the patient. REviewed keeping the area clean and dry, use cotton swabs after bathing to dry, the napply ointment.  Use for 7 days.

## 2021-09-16 ENCOUNTER — Encounter (HOSPITAL_BASED_OUTPATIENT_CLINIC_OR_DEPARTMENT_OTHER): Payer: Self-pay | Admitting: Family Medicine

## 2021-09-16 DIAGNOSIS — L819 Disorder of pigmentation, unspecified: Secondary | ICD-10-CM

## 2021-09-21 ENCOUNTER — Other Ambulatory Visit (HOSPITAL_BASED_OUTPATIENT_CLINIC_OR_DEPARTMENT_OTHER): Payer: BC Managed Care – PPO | Admitting: Dermatology

## 2021-09-21 DIAGNOSIS — L989 Disorder of the skin and subcutaneous tissue, unspecified: Secondary | ICD-10-CM

## 2021-09-21 NOTE — e-consult (Signed)
Thank you for referring your patient for an e-consultation. I have not interviewed or examined the patient. My recommendations here are based on the review of relevant clinical information and information provided by the referring provider.    Consultation request:   new pigmented papules on chest - smooth in contour, pigment overall regular - probably needs non-urgent in person eval but will leave that to you.     Image assessment:   There are two clinical images provided  - There are close up views of a well circumscribed homogeneously colored brown papule approximately 3-66m in size on the central chest    Assessment:   26year old woman referred via teledermatology econsult regarding brown papule on the central chest. The clinical images are most suggestive of melanocytic nevi, more than seborrheic keratoses, lentigenes, or melanoma. An in person dermatology visit is advised to evaluate these with dermoscopy for highest diagnostic accuracy    Recommendations:  - please place internal dermatology referral and we will arrange for the patient to be seen on routine basis      Please let me know whether I can be of further assistance and please let me know how the patient responds to your management.    Sincerely yours,    RGunnar Bulla MD    Patient Disposition : REGULAR FOLLOW UP VISIT NEEDED    Time spent on this e-Consultation :  >5 min, >50% time spent reviewing/ analyzing record, written report will be generated

## 2021-11-05 ENCOUNTER — Inpatient Hospital Stay: Admit: 2021-11-05 | Discharge: 2021-11-05 | Payer: MEDICAID | Primary: Cardiovascular Disease

## 2021-11-05 ENCOUNTER — Encounter: Admit: 2021-11-05 | Payer: PRIVATE HEALTH INSURANCE | Primary: Cardiovascular Disease

## 2021-11-05 DIAGNOSIS — T1491XA Suicide attempt, initial encounter: Secondary | ICD-10-CM

## 2021-11-05 DIAGNOSIS — J020 Streptococcal pharyngitis: Secondary | ICD-10-CM

## 2021-11-05 DIAGNOSIS — Z9089 Acquired absence of other organs: Secondary | ICD-10-CM

## 2021-11-05 DIAGNOSIS — F32A Depression: Secondary | ICD-10-CM

## 2021-11-05 DIAGNOSIS — F419 Anxiety disorder, unspecified: Secondary | ICD-10-CM

## 2021-11-05 MED ORDER — MECLIZINE 25 MG TABLET
25 mg | ORAL_TABLET | Freq: Three times a day (TID) | ORAL | 1 refills | Status: DC | PRN
Start: 2021-11-05 — End: 2021-11-24

## 2021-11-05 NOTE — ED Notes
Caryn Bee, PA assessedEKG done, BS assessed, Covid swab obtained DC inst provided; the provider reviewed reasons to go to the ED

## 2021-11-05 NOTE — Discharge Instructions
Take medication as directed, follow up with your doctor.  If your symptoms worsen, go to the ER.

## 2021-11-05 NOTE — ED Provider Notes
Chief Complaint Patient presents with ? Dizziness   SX X 24HRS; STATES SHE FEELS LIKE THE ROOM IS ALMOST SPINNING; +NAUSEA ; SYNCOPE X 1 IN DECEMBER  HPI/PE:Chief ComplaintPatient presents with?	Dizziness		SX X 24HRS; STATES SHE FEELS LIKE THE ROOM IS ALMOST SPINNING; +NAUSEA ; SYNCOPE X 1 IN DECEMBER Seen by cardiology in January, had holter monitor.  States she has a hx of heavy periods and low hemoglobin.   Physical ExamED Triage Vitals [11/05/21 1826]BP: 105/68Pulse: 62Pulse from  O2 sat: n/aResp: 20Temp: 98 ?F (36.7 ?C)Temp src: TemporalSpO2: 99 % BP 106/73  - Pulse (!) 54  - Temp 98 ?F (36.7 ?C) (Temporal)  - Resp 18  - LMP 11/04/2021  - SpO2 99% Physical ExamVitals and nursing note reviewed. Constitutional:     Appearance: She is not ill-appearing. HENT:    Nose: No congestion. Eyes:    Extraocular Movements: Extraocular movements intact.    Pupils: Pupils are equal, round, and reactive to light. Cardiovascular:    Rate and Rhythm: Normal rate and regular rhythm. Pulmonary:    Effort: Pulmonary effort is normal.    Breath sounds: Normal breath sounds. Abdominal:    Palpations: Abdomen is soft. Lymphadenopathy:    Cervical: No cervical adenopathy. Neurological:    Mental Status: She is alert and oriented to person, place, and time.    GCS: GCS eye subscore is 4. GCS verbal subscore is 5. GCS motor subscore is 6.    Cranial Nerves: Cranial nerves 2-12 are intact.    Sensory: Sensation is intact.    Motor: Motor function is intact.    Coordination: Coordination is intact. Coordination normal. Finger-Nose-Finger Test and Heel to [facility] Test normal.    Gait: Gait is intact. Psychiatric:       Mood and Affect: Mood is anxious.  ProceduresAttestation/Critical CarePatient Reevaluation: Pt looks well and exam reveals no focal findings.  Possibly vertigo, uri.  I don't think this is cardiac or neurological in nature.  Will check sugar, ecg and covid.  Clinical Impressions as of 11/05/21 1859 Vertigo  ED DispositionDischarge Orinda Kenner, PA08/28/23 1837 Orinda Kenner, PA08/28/23 Carlis Stable Orinda Kenner, PA08/28/23 1859

## 2021-11-06 LAB — INFLUENZA TYPE A/B RNA     (BH GH LMW Q YH)
BKR INFLUENZA A: NEGATIVE
BKR INFLUENZA B: NEGATIVE

## 2021-11-06 LAB — SARS COV-2 (COVID-19) RNA: BKR SARS-COV-2 RNA (COVID-19) (YH): NEGATIVE

## 2021-11-16 ENCOUNTER — Other Ambulatory Visit: Payer: Self-pay

## 2021-11-16 ENCOUNTER — Ambulatory Visit: Payer: BC Managed Care – PPO | Attending: Procedural Dermatology | Admitting: Procedural Dermatology

## 2021-11-16 DIAGNOSIS — D225 Melanocytic nevi of trunk: Secondary | ICD-10-CM | POA: Insufficient documentation

## 2021-11-16 NOTE — Progress Notes (Signed)
NEW PATIENT DERMATOLOGY NOTE    Chief complaint: lesion on chest    HPI:  Frederick Klinger is a very pleasant 26 year old female who presents for evaluation and management of:    Mole on mid chest that she would like examined today, present for about 1 year and she was advised to have it examined by friends. Not painful. Not itchy. Has not bled. No treatment to date.         No personal history of skin cancer.         Review of Systems: Feels well, no systemic complaints, no other skin complaints.     Patient Active Problem List:     BMI 35.0-35.9,adult     Family history of diabetes mellitus     Peripheral retinal degeneration, lattice     Low grade squamous intraepith lesion on cytologic smear cervix (lgsil)     History of COVID-19     Menstrual irregularity        Medications  ulipristal acetate (ELLA) 30 MG tablet, Ella 30 mg tablet   Take 1 tablet by oral route for 1 day., Disp: , Rfl:     ibuprofen (ADVIL,MOTRIN) tablet 600 mg, 600 mg, Oral, Once, [name], MD        Allergies  Patient has no known allergies.        Family History:  Melanoma: negative  Non-melanoma skin cancer: negative          Exam/Assessment/Plan:  Well appearing pt in NAD; mood and affect are wnl  A skin examination was performed including scalp, head, eyes, ears, nose, lips, neck, hands, fingers, fingernails, chest. All findings within normal limits unless otherwise noted below:  Skin type: III      Pertinent exam findings include:    Nevi. There are benign appearing nevi on the trunk and extremities. The ABCDE's of melanoma were discussed as well as the need for regular skin self examinations and the regular practice of photoprotection.   - includes lesion of pt concern on mid chest, clinically c/w benign melanocytic nevus. Reviewed ABCDEs and photoprotection advised.         Return to clinic as needed, sooner prn changes/concerns    Ferd Glassing MD  [facility]

## 2021-11-22 ENCOUNTER — Other Ambulatory Visit (HOSPITAL_BASED_OUTPATIENT_CLINIC_OR_DEPARTMENT_OTHER): Payer: Self-pay | Admitting: Family Medicine

## 2021-11-22 DIAGNOSIS — R87612 Low grade squamous intraepithelial lesion on cytologic smear of cervix (LGSIL): Secondary | ICD-10-CM

## 2021-11-22 NOTE — Telephone Encounter (Signed)
[facility], Dawn Merritt is a 26 year old female has requested a refill of birth control .      Last Office Visit: 08/22/2021 with Sabra Heck  Last Physical Exam: n/a    There are no preventive care reminders to display for this patient.    Other Med Adult:  Most Recent BP Reading(s)  02/05/21 : 124/76        Cholesterol (mg/dL)   Date Value   09/08/2019 187     LOW DENSITY LIPOPROTEIN DIRECT (mg/dL)   Date Value   09/08/2019 106     HIGH DENSITY LIPOPROTEIN (mg/dL)   Date Value   09/08/2019 65     TRIGLYCERIDES (mg/dL)   Date Value   09/08/2019 71         No results found for: TSHSC      No results found for: TSH    HEMOGLOBIN A1C (%)   Date Value   09/08/2019 4.5       No results found for: POCA1C      No results found for: INR    SODIUM (mmol/L)   Date Value   04/23/2016 142       POTASSIUM (mmol/L)   Date Value   04/23/2016 3.9           CREATININE (mg/dL)   Date Value   04/23/2016 0.5       Documented patient preferred pharmacies:

## 2021-11-24 ENCOUNTER — Ambulatory Visit: Admit: 2021-11-24 | Payer: MEDICAID | Primary: Cardiovascular Disease

## 2021-11-24 ENCOUNTER — Inpatient Hospital Stay: Admit: 2021-11-24 | Discharge: 2021-11-24 | Payer: MEDICAID | Attending: Emergency Medicine

## 2021-11-24 DIAGNOSIS — Z79899 Other long term (current) drug therapy: Secondary | ICD-10-CM

## 2021-11-24 DIAGNOSIS — R112 Nausea with vomiting, unspecified: Secondary | ICD-10-CM

## 2021-11-24 DIAGNOSIS — N83201 Unspecified ovarian cyst, right side: Secondary | ICD-10-CM

## 2021-11-24 DIAGNOSIS — R109 Unspecified abdominal pain: Secondary | ICD-10-CM

## 2021-11-24 LAB — URINE MICROSCOPIC     (BH GH LMW YH)

## 2021-11-24 LAB — URINALYSIS WITH CULTURE REFLEX      (BH LMW YH)
BKR BILIRUBIN, UA: NEGATIVE
BKR BLOOD, UA: NEGATIVE
BKR GLUCOSE, UA: NEGATIVE
BKR KETONES, UA: NEGATIVE
BKR LEUKOCYTE ESTERASE, UA: NEGATIVE
BKR NITRITE, UA: NEGATIVE
BKR PH, UA: 8 — ABNORMAL HIGH (ref 5.5–7.5)
BKR SPECIFIC GRAVITY, UA: 1.02 (ref 1.005–1.030)
BKR UROBILINOGEN, UA (MG/DL): <2 mg/dL (ref <=2.0)

## 2021-11-24 LAB — COMPREHENSIVE METABOLIC PANEL
BKR A/G RATIO: 1.1 (ref 1.0–2.2)
BKR ALANINE AMINOTRANSFERASE (ALT): 19 U/L (ref 14–63)
BKR ALBUMIN: 3.9 g/dL (ref 3.4–5.0)
BKR ALKALINE PHOSPHATASE: 38 U/L — ABNORMAL LOW (ref 46–116)
BKR ANION GAP: 13 (ref 5–16)
BKR ASPARTATE AMINOTRANSFERASE (AST): 18 U/L (ref 10–42)
BKR AST/ALT RATIO: 0.9
BKR BILIRUBIN TOTAL: 0.3 mg/dL (ref 0.2–1.2)
BKR BLOOD UREA NITROGEN: 12 mg/dL (ref 7–18)
BKR BUN / CREAT RATIO: 13.3 (ref 8.0–23.0)
BKR CALCIUM: 8.9 mg/dL (ref 8.5–10.5)
BKR CHLORIDE: 97 mmol/L — ABNORMAL LOW (ref 98–107)
BKR CO2: 23 mmol/L (ref 21–32)
BKR CREATININE: 0.9 mg/dL (ref 0.60–1.00)
BKR EGFR, CREATININE (CKD-EPI 2021): >60 mL/min/{1.73_m2} (ref >=60)
BKR GLOBULIN: 3.5 g/dL (ref 2.3–3.5)
BKR GLUCOSE: 103 mg/dL — ABNORMAL HIGH (ref 70–100)
BKR POTASSIUM: 3.1 mmol/L — ABNORMAL LOW (ref 3.5–5.1)
BKR PROTEIN TOTAL: 7.4 g/dL (ref 6.0–8.0)
BKR SODIUM: 133 mmol/L — ABNORMAL LOW (ref 136–145)

## 2021-11-24 LAB — CBC WITH AUTO DIFFERENTIAL
BKR WAM ABSOLUTE IMMATURE GRANULOCYTES.: 0.02 x 1000/ÂµL (ref 0.00–0.30)
BKR WAM ABSOLUTE LYMPHOCYTE COUNT.: 0.24 x 1000/ÂµL — ABNORMAL LOW (ref 0.60–3.70)
BKR WAM ABSOLUTE NRBC (2 DEC): 0 x 1000/ÂµL (ref 0.00–1.00)
BKR WAM ANALYZER ANC: 4.66 x 1000/ÂµL (ref 2.00–7.60)
BKR WAM BASOPHIL ABSOLUTE COUNT.: 0.01 x 1000/ÂµL (ref 0.00–1.00)
BKR WAM BASOPHILS: 0.2 % (ref 0.0–1.4)
BKR WAM EOSINOPHIL ABSOLUTE COUNT.: 0 x 1000/ÂµL (ref 0.00–1.00)
BKR WAM EOSINOPHILS: 0 % (ref 0.0–5.0)
BKR WAM HEMATOCRIT (2 DEC): 37.6 % (ref 35.00–45.00)
BKR WAM HEMOGLOBIN: 12.4 g/dL (ref 11.7–15.5)
BKR WAM IMMATURE GRANULOCYTES: 0.4 % (ref 0.0–1.0)
BKR WAM LYMPHOCYTES: 4.6 % — ABNORMAL LOW (ref 17.0–50.0)
BKR WAM MCH (PG): 27.3 pg (ref 27.0–33.0)
BKR WAM MCHC: 33 g/dL (ref 31.0–36.0)
BKR WAM MCV: 82.8 fL (ref 80.0–100.0)
BKR WAM MONOCYTE ABSOLUTE COUNT.: 0.27 x 1000/ÂµL (ref 0.00–1.00)
BKR WAM MONOCYTES: 5.2 % (ref 4.0–12.0)
BKR WAM MPV: 11.5 fL (ref 8.0–12.0)
BKR WAM NEUTROPHILS: 89.6 % — ABNORMAL HIGH (ref 39.0–72.0)
BKR WAM NUCLEATED RED BLOOD CELLS: 0 % (ref 0.0–1.0)
BKR WAM PLATELETS: 153 x1000/ÂµL (ref 150–420)
BKR WAM RDW-CV: 14.5 % (ref 11.0–15.0)
BKR WAM RED BLOOD CELL COUNT.: 4.54 M/ÂµL (ref 4.00–6.00)
BKR WAM WHITE BLOOD CELL COUNT: 5.2 x1000/ÂµL (ref 4.0–11.0)

## 2021-11-24 LAB — UA REFLEX CULTURE

## 2021-11-24 MED ORDER — IBUPROFEN 600 MG TABLET
600 mg | ORAL_TABLET | Freq: Four times a day (QID) | ORAL | 1 refills | Status: AC | PRN
Start: 2021-11-24 — End: ?

## 2021-11-24 MED ORDER — ONDANSETRON 4 MG DISINTEGRATING TABLET
4 mg | ORAL_TABLET | Freq: Three times a day (TID) | 1 refills | Status: AC | PRN
Start: 2021-11-24 — End: ?

## 2021-11-24 MED ORDER — ONDANSETRON HCL (PF) 4 MG/2 ML INJECTION SOLUTION
4 mg/2 mL | Freq: Once | INTRAVENOUS | Status: CP
Start: 2021-11-24 — End: ?
  Administered 2021-11-24: 19:00:00 4 mL via INTRAVENOUS

## 2021-11-24 MED ORDER — SODIUM CHLORIDE 0.9 % BOLUS (NEW BAG)
0.9 % | Freq: Once | INTRAVENOUS | Status: CP
Start: 2021-11-24 — End: ?
  Administered 2021-11-24: 19:00:00 0.9 mL/h via INTRAVENOUS

## 2021-11-24 MED ORDER — KETOROLAC 30 MG/ML (1 ML) INJECTION SOLUTION
30 mg/mL (1 mL) | Freq: Once | INTRAVENOUS | Status: CP
Start: 2021-11-24 — End: ?
  Administered 2021-11-24: 19:00:00 30 mL via INTRAVENOUS

## 2021-11-24 MED ORDER — OXYCODONE-ACETAMINOPHEN 5 MG-325 MG TABLET
5-325 mg | ORAL_TABLET | Freq: Four times a day (QID) | ORAL | 1 refills | Status: AC | PRN
Start: 2021-11-24 — End: ?

## 2021-11-24 MED ORDER — IOHEXOL 350 MG IODINE/ML INTRAVENOUS SOLUTION
350 mg iodine/mL | Freq: Once | INTRAVENOUS | Status: CP | PRN
Start: 2021-11-24 — End: ?
  Administered 2021-11-24: 19:00:00 350 mL via INTRAVENOUS

## 2021-11-24 NOTE — ED Provider Notes
Chief Complaint Patient presents with ? Abdominal Pain   Lower abd pain x12 hours. +Nausea, body aches, fever/chills; reports feeling nuaseated and just throwing up water or whatever I try to keep down; denies diarrhea; LMP 2 weeks ago HPI/PE:This is a 26 year old female with a history of anxiety who presents today for evaluation of acute onset of abdominal pain associated with nausea/vomiting/fever/chills and loss of appetite.  Symptoms started about 12 hours ago.  She denies vaginal bleeding/discharge.  She denies dysuria/frequency/urgency.  She denies pregnancy.  No prior abdominal surgeries.  Physical ExamED Triage Vitals [11/24/21 1355]BP: 105/72Pulse: (!) 96Pulse from  O2 sat: n/aResp: 17Temp: 99 ?F (37.2 ?C)Temp src: TemporalSpO2: 99 % BP 105/72  - Pulse (!) 96  - Temp 99 ?F (37.2 ?C) (Temporal)  - Resp 17  - Ht 5' 4 (1.626 m)  - Wt 68 kg (150 lb)  - LMP 11/04/2021  - SpO2 99%  - BMI 25.75 kg/m? Physical ExamVitals and nursing note reviewed. Constitutional:     Appearance: She is well-developed.    Comments: Tearful and anxious appearing HENT:    Head: Normocephalic and atraumatic.    Mouth/Throat:    Mouth: Mucous membranes are moist. Eyes:    Extraocular Movements: Extraocular movements intact. Cardiovascular:    Rate and Rhythm: Normal rate and regular rhythm.    Heart sounds: Normal heart sounds. Pulmonary:    Effort: Pulmonary effort is normal.    Breath sounds: Normal breath sounds. No wheezing, rhonchi or rales. Abdominal:    General: Bowel sounds are normal. There is no distension.    Palpations: Abdomen is soft.    Tenderness: There is abdominal tenderness in the right lower quadrant and periumbilical area. There is guarding. There is no right CVA tenderness, left CVA tenderness or rebound. Skin:   General: Skin is warm and dry. Neurological:    General: No focal deficit present.    Mental Status: She is alert and oriented to person, place, and time.  ProceduresAttestation/Critical CarePatient Reevaluation: Patient with right lower quadrant tenderness on examination, acute onset of abdominal pain associated with nausea/vomiting/chills.  Differential includes appendicitis, UTI, ovarian torsion, ovarian cyst, colitis, mass.  IV fluids/Toradol/Zofran initiated.  I reviewed laboratory results, no leukocytosis, hemoglobin is stable, electrolytes are stable.  Urine pregnancy test is negative.  Urinalysis is negative for infection.  [place] Non-OB Transvaginal with Limited Doppler Final Result       1. No evidence of ovarian torsion bilaterally.  2. A 1.5 cm partially collapsed cystic structure in the right ovary, likely compatible with a recently ruptured hemorrhagic or corpus luteal cyst. Small volume free fluid in the pelvis.  3. IUD in the uterus.   4. 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     Report initiated by:  Guadalupe Maple, MD    Reported and signed by: Channing Mutters, MD      [facility] Radiology and Biomedical Imaging   [place] Abdomen Pelvis with IV Contrast without oral (BMI>25) Final Result  1. Right ovarian corpus luteum with surrounding free fluid could suggest mittelschmerz.   2. 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 Ultrasound follow-up in 1 year is recommended.         [place] [facility] Notify System Classification: Follow-up Only    Reported and signed by: Sharyne Peach, MD      [facility] Radiology and Biomedical Imaging   I have reassessed patient and informed her of [place] and ultrasound findings confirming ruptured ovarian cyst.  Small trace fluid in the  pelvis.  Anticipatory guidance instructions have been given.  Her pain has improved with Toradol but not resolved.  Her nausea/vomiting has resolved.  Her vital signs remained stable.  Will discharge home, prescription for ibuprofen, Percocet, Zofran sent.  Patient advised to follow-up with her gyn physician for further management.  She understands and is comfortable with the discharge plan.Patient progress: improvedClinical Impressions as of 11/24/21 1706 Ruptured ovarian cyst  ED DispositionDischarge Pilar Grammes, MD09/16/23 1706

## 2021-11-24 NOTE — ED Notes
5:35 PM IV removed, catheter intact. Discharge instructions reviewed, verbalized understanding. Medications sent to preferred pharmacy. Work note provided to patient. Ambulatory with steady gait upon departure with family.

## 2021-11-24 NOTE — Discharge Instructions
Ibuprofen up to 4 times a day AS NEEDED for painPercocet up to 3 times a day AS NEEDED for severe pain. DO NOT TAKE PERCOCET AND ZOLPIDEM (AMBIEN) TOGETHER!Zofran up to 3 times a day AS NEEDED for nausea/vomitingFollow up with your OBGYN physician for further management

## 2021-11-24 NOTE — ED Notes
3:22 PMPt states abdominal pain with nausea and vomiting. Pt also c/o chills. Md to bedside. Medlock placed.Labs obtained.Pt medicated. Pt returmed from catscan. 4:56 PMPt remains in ultrasound.

## 2021-11-25 LAB — EXTRA BLUE TOP SPECIMEN     (BH GH LMW)

## 2022-02-05 ENCOUNTER — Ambulatory Visit: Payer: BC Managed Care – PPO | Attending: Family Medicine | Admitting: Family Medicine

## 2022-02-05 ENCOUNTER — Encounter (HOSPITAL_BASED_OUTPATIENT_CLINIC_OR_DEPARTMENT_OTHER): Payer: Self-pay | Admitting: Family Medicine

## 2022-02-05 ENCOUNTER — Other Ambulatory Visit: Payer: Self-pay

## 2022-02-05 VITALS — BP 115/74 | HR 75 | Wt 200.0 lb

## 2022-02-05 DIAGNOSIS — Z833 Family history of diabetes mellitus: Secondary | ICD-10-CM | POA: Diagnosis present

## 2022-02-05 DIAGNOSIS — Z0000 Encounter for general adult medical examination without abnormal findings: Secondary | ICD-10-CM | POA: Insufficient documentation

## 2022-02-05 DIAGNOSIS — Z124 Encounter for screening for malignant neoplasm of cervix: Secondary | ICD-10-CM | POA: Diagnosis present

## 2022-02-05 DIAGNOSIS — R87612 Low grade squamous intraepithelial lesion on cytologic smear of cervix (LGSIL): Secondary | ICD-10-CM | POA: Diagnosis present

## 2022-02-05 NOTE — Progress Notes (Signed)
Chief Complaint:  Dawn Merritt is a 26 year old female who presents for a physical exam.  Patient has other current concerns of  would like repeat pap today.  Declines any lab visits today..  I have reviewed the patient's medical history in detail and updated the computerized patient record.  JUNEL 1/20 1-20 MG-MCG per tablet, TAKE 1 TABLET BY MOUTH EVERY DAY IN THE MORNING, Disp: 357 tablet, Rfl: 0  ulipristal acetate (ELLA) 30 MG tablet, Ella 30 mg tablet   Take 1 tablet by oral route for 1 day., Disp: , Rfl:     ibuprofen (ADVIL,MOTRIN) tablet 600 mg, 600 mg, Oral, Once, [name], MD      Allergies: Patient has no known allergies.      Review of Systems:                   Skin: negative  Eyes: negative  Ears/Nose/Throat: negative  Respiratory: negative  Cardiovascular: negative  Gastrointestinal: negative  Genitourinary: negative  BMW:UXLKGM menses, no abnormal bleeding, pelvic pain or discharge,no breast pain or new or enlarging lumps on self exam  Musculoskeletal: negative  Neurologic: negative  Endocrine: negative  Psychiatric: negative  Hematologic/Lymphatic/Immunologic: negative    Physical:  BP 115/74   Pulse 75   Wt 90.7 kg (200 lb)   SpO2 100%   BMI 34.33 kg/m   General appearance: healthy, alert, well developed, well nourished  Eyes: negative  Skin: visible skin with normal color, texture.  Head: Normocephalic.   Ears: External ears normal.     Neck: Neck supple. No adenopathy. Thyroid symmetric, normal size, and without nodularity  Back: Back symmetric  Lungs: chest symmetric with normal A/P diameter, no chest deformities noted, normal respiratory rate and rhythm, lungs clear to auscultation  Heart:  regular rate and rhythm, S1 normal, S2 normal, no murmurs, clicks, or gallops  Extremities: Extremities normal. No deformities, edema, or skin discoloration  Musculoskeletal: Muscular strength grossly intact.  Neuro: alert, interactive.  Speech is fluent.  cranial nerves 2-12 grossly intact,  normal gait   Pelvic: negative findings: external genitalia normal, Bartholin's glands, urethra, Skene's glands negative, vaginal mucosa normal, cervix clear, no CMT  Rectal: deferred    Health Counseling:  Smoking:  Reviewed and Discussed  Substance Use Issues:   Reviewed and Discussed   Diet, Exercise, Wt. Control:  Reviewed and Discussed  Dental Health:  Reviewed and Discussed  Vision Health:  Reviewed and Discussed  Mental Health:  Reviewed and Discussed  Domestic Violence:    Sexual Health:  Reviewed and Discussed      ASSESSMENT/PLAN:  Routine Health Care: Due for routine well exam and age and risk based screening and counseling.  This was completed today.    Problem List          Unprioritized    Family history of diabetes mellitus    Overview     Annual A1c, counseled again about importance healthy weight and exercise habits to prevent diabetes  11/23 declined bloodwork today.    HEMOGLOBIN A1C (%)   Date Value   09/08/2019 4.5   05/14/2012 4.6   No results found for: "POCA1C"           Low grade squamous intraepith lesion on cytologic smear cervix (lgsil) - Primary    Overview     02/05/22 cotesting done today at pt request:    10/21 pap (with reflex) - per ASCCP guidelines for follow up - pap NILM.  ASCCP  guidelines "use clinical judgement" will plan return to routine follow up 3 year pap.  01/06/19 NILM >> pap 1y 01/06/2020  Sept 2019 colpo no visible lesions, pathology: squamous metaplasia  09/2017 pap ASCUS-H  Age 21, first pap - plan repeat cytology 12 months  09/2016 pap ascus/h and lsil, will need colpo  CERVICAL BIOPSY 12:00:   -SQUAMOUS ATYPIA.   -TRANSFORMATION ZONE IS PRESENT.   MULTIPLE LEVELS EXAMINED.           CERVICAL BIOPSY 9:00:   -NEGATIVE FOR DYSPLASIA.   -TRANSFORMATION ZONE IS PRESENT.       - ENDOCERVICAL CURETTAGE:   -FRAGMENTS OF UNREMARKABLE ENDOCERVICAL EPITHELIUM.     7/19 ASCUS-H HPV neg, will need repeat colpo    >>FINAL DIAGNOSIS<<       CERVICAL BIOPSY, 5:00:   ATYPICAL  SQUAMOUS METAPLASIA.   NO DEFINITIVE DYSPLASIA IS IDENTIFIED.   MULTIPLE LEVELS ARE EXAMINED.   p16 IMMUNOSTAIN SHOWS WEAK AND PATCHY (NONSPECIFIC) EXPRESSION WITHIN   ATYPICAL SQUAMOUS METAPLASIA, WHILE KI-67 DEMONSTRATES APPROPRIATE   REACTIVITY, PREDOMINANTLY WITHIN THE BASAL EPITHELIAL LAYER.       ENDOCERVICAL CURETTAGE:   BENIGN ENDOCERVICAL TISSUE.   NO DYSPLASIA IS IDENTIFIED.   MULTIPLE LEVELS ARE EXAMINED.   p16 AND KI-67 ARE BOTH LARGELY NEGATIVE WITHIN THE TISSUE SAMPLE.     Per ASCCP guidelines, will continue with pap and colpo q 6 months x 2 years or until 2 normals in a row.             Relevant Orders    CYTOPATH, C/V, THIN LAYER    OBTAINING SCREEN PAP SMEAR (Completed)    HUMAN PAPILLOMAVIRUS (HPV)    CHLAMYDIA GC NAAT     Due for routine well exam and age and risk based screening and counseling     She declines any bloodwork or vaccines today.      We discussed the patient's diagnoses and the importance of medication compliance. The patient was ready to learn and no apparent learning barriers were identified. I explained the diagnosis and treatment plan, and the patient expressed understanding of the content. Possible side effects of the prescribed medication(s) were explained, including (none).  I attempted to answer any questions regarding the diagnosis and the proposed treatment.

## 2022-02-06 LAB — CHLAMYDIA GC NAAT
CHLAMYDIA TRACHOMATIS NAAT: NEGATIVE
NEISSERIA GONORRHOEAE NAAT: NEGATIVE

## 2022-02-07 ENCOUNTER — Encounter (HOSPITAL_BASED_OUTPATIENT_CLINIC_OR_DEPARTMENT_OTHER): Payer: Self-pay | Admitting: Family Medicine

## 2022-02-07 LAB — CYTOPATH, C/V, THIN LAYER

## 2022-02-07 LAB — HUMAN PAPILLOMAVIRUS (HPV): HUMAN PAPILLOMAVIRUS: NEGATIVE

## 2022-04-02 ENCOUNTER — Inpatient Hospital Stay
Admit: 2022-04-02 | Discharge: 2022-04-02 | Payer: MEDICAID | Attending: Internal Medicine | Primary: Cardiovascular Disease

## 2022-04-02 ENCOUNTER — Encounter: Admit: 2022-04-02 | Payer: PRIVATE HEALTH INSURANCE | Primary: Cardiovascular Disease

## 2022-04-02 ENCOUNTER — Ambulatory Visit: Admit: 2022-04-02 | Payer: MEDICAID | Primary: Cardiovascular Disease

## 2022-04-02 ENCOUNTER — Inpatient Hospital Stay: Admit: 2022-04-02 | Discharge: 2022-04-02 | Payer: MEDICAID

## 2022-04-02 DIAGNOSIS — J020 Streptococcal pharyngitis: Secondary | ICD-10-CM

## 2022-04-02 DIAGNOSIS — F419 Anxiety disorder, unspecified: Secondary | ICD-10-CM

## 2022-04-02 DIAGNOSIS — Z9089 Acquired absence of other organs: Secondary | ICD-10-CM

## 2022-04-02 DIAGNOSIS — T1491XA Suicide attempt, initial encounter: Secondary | ICD-10-CM

## 2022-04-02 DIAGNOSIS — R0602 Shortness of breath: Secondary | ICD-10-CM

## 2022-04-02 DIAGNOSIS — F32A Depression: Secondary | ICD-10-CM

## 2022-04-02 MED ORDER — ASPIRIN 81 MG CHEWABLE TABLET
81 mg | Freq: Once | ORAL | Status: CP
Start: 2022-04-02 — End: ?
  Administered 2022-04-03: 02:00:00 81 mg via ORAL

## 2022-04-02 NOTE — Discharge Instructions
GO DIRECTLY NOW TO [facility] EM[facility]GENCY ROOM FOR FURTHER EVALUATION AND MANAGEMENT OF YOUR LEFT-SIDED CHEST PAIN TO RULE OUT ANY CARDIOPULMONARY ETIOLOGY

## 2022-04-02 NOTE — ED Notes
Referred to [facility] ED now for evaluation.

## 2022-04-03 DIAGNOSIS — Z975 Presence of (intrauterine) contraceptive device: Secondary | ICD-10-CM

## 2022-04-03 DIAGNOSIS — R0789 Other chest pain: Secondary | ICD-10-CM

## 2022-04-03 DIAGNOSIS — Z8616 Personal history of COVID-19: Secondary | ICD-10-CM

## 2022-04-03 LAB — CBC WITH AUTO DIFFERENTIAL
BKR GLOBULIN: 2.23 x 1000/??L — ABNORMAL HIGH (ref 0.60–3.70)
BKR WAM ABSOLUTE IMMATURE GRANULOCYTES.: 0.01 x 1000/ÂµL (ref 0.00–0.30)
BKR WAM ABSOLUTE LYMPHOCYTE COUNT.: 2.23 x 1000/ÂµL (ref 0.60–3.70)
BKR WAM ABSOLUTE NRBC (2 DEC): 0 x 1000/ÂµL (ref 0.00–1.00)
BKR WAM ANALYZER ANC: 4.53 x 1000/ÂµL (ref 2.00–7.60)
BKR WAM BASOPHIL ABSOLUTE COUNT.: 0.03 x 1000/ÂµL (ref 0.00–1.00)
BKR WAM BASOPHILS: 0.4 % (ref 0.0–1.4)
BKR WAM EOSINOPHIL ABSOLUTE COUNT.: 0.06 x 1000/ÂµL (ref 0.00–1.00)
BKR WAM EOSINOPHILS: 0.8 % (ref 0.0–5.0)
BKR WAM HEMATOCRIT (2 DEC): 39.9 % (ref 35.00–45.00)
BKR WAM HEMOGLOBIN: 13.1 g/dL (ref 11.7–15.5)
BKR WAM IMMATURE GRANULOCYTES: 0.1 % — ABNORMAL LOW (ref 0.0–1.0)
BKR WAM LYMPHOCYTES: 30.3 % (ref 17.0–50.0)
BKR WAM MCH (PG): 27.9 pg (ref 27.0–33.0)
BKR WAM MCHC: 32.8 g/dL (ref 31.0–36.0)
BKR WAM MCV: 85.1 fL (ref 80.0–100.0)
BKR WAM MONOCYTE ABSOLUTE COUNT.: 0.5 x 1000/ÂµL (ref 0.00–1.00)
BKR WAM MONOCYTES: 6.8 % (ref 4.0–12.0)
BKR WAM MPV: 11.5 fL (ref 8.0–12.0)
BKR WAM NEUTROPHILS: 61.6 % (ref 39.0–72.0)
BKR WAM NUCLEATED RED BLOOD CELLS: 0 % (ref 0.0–1.0)
BKR WAM PLATELETS: 178 x1000/ÂµL (ref 150–420)
BKR WAM RDW-CV: 13.5 % (ref 11.0–15.0)
BKR WAM RED BLOOD CELL COUNT.: 4.69 M/ÂµL (ref 4.00–6.00)
BKR WAM WHITE BLOOD CELL COUNT: 7.4 x1000/ÂµL (ref 4.0–11.0)

## 2022-04-03 LAB — COMPREHENSIVE METABOLIC PANEL
BKR A/G RATIO: 1.1 x 1000/??L (ref 1.0–2.2)
BKR ALANINE AMINOTRANSFERASE (ALT): 19 U/L (ref 14–63)
BKR ALBUMIN: 4.1 g/dL (ref 3.4–5.0)
BKR ALKALINE PHOSPHATASE: 36 U/L — ABNORMAL LOW (ref 46–116)
BKR ANION GAP: 11 g/dL (ref 5–16)
BKR ASPARTATE AMINOTRANSFERASE (AST): 14 U/L (ref 10–42)
BKR AST/ALT RATIO: 0.7 x 1000/??L (ref 0.00–1.00)
BKR BILIRUBIN TOTAL: 0.3 mg/dL (ref 0.2–1.2)
BKR BLOOD UREA NITROGEN: 12 mg/dL (ref 7–18)
BKR BUN / CREAT RATIO: 14.1 % (ref 8.0–23.0)
BKR CALCIUM: 9.1 mg/dL (ref 8.5–10.5)
BKR CHLORIDE: 104 mmol/L (ref 98–107)
BKR CO2: 28 mmol/L (ref 21–32)
BKR CREATININE: 0.85 mg/dL (ref 0.60–1.00)
BKR EGFR, CREATININE (CKD-EPI 2021): >60 mL/min/{1.73_m2} (ref >=60)
BKR GLUCOSE: 91 mg/dL (ref 70–100)
BKR POTASSIUM: 3.5 mmol/L (ref 3.5–5.1)
BKR PROTEIN TOTAL: 7.7 g/dL (ref 6.0–8.0)
BKR SODIUM: 143 mmol/L (ref 136–145)

## 2022-04-03 LAB — MAGNESIUM: BKR MAGNESIUM: 2 mg/dL (ref 1.8–2.5)

## 2022-04-03 LAB — TROPONIN T HIGH SENSITIVITY, 0 HOUR BASELINE WITH REFLEX (BH GH LMW YH): BKR TROPONIN T HS 0 HOUR BASELINE: <6 ng/L

## 2022-04-03 LAB — D-DIMER, QUANTITATIVE: BKR D-DIMER: 0.58 mg/L FEU — ABNORMAL HIGH (ref <=0.50)

## 2022-04-03 NOTE — ED Notes
8:42 PM pt sent from [facility] for eval of left -sided chest pain x2 months with dyspnea on exertion. Symptoms worsened the past few days, pt states she randomly feels like it is hard to breathe or take a deep breath. Denies recent travel. Speaking in clear complete sentences. Pt seen and evaluated by PA. IV placed and labs drawn. PO aspirin given as ordered. 9:15 PM labs resulted, PA speaking with patient. 9:48 PM cleared for d/c by PA. IV removed. Pt verbalized understanding of discharge instructions and follow up, and walked out pf the dept with steady gait with visitor.

## 2022-04-03 NOTE — ED Provider Notes
Chief Complaint Patient presents with ? Chest Pain   Sent from [facility]: Intermittent left sided chest pain and dyspnea x2 months; Sx worse today. No known fever/URI Sx. Dx w/COVID around New Years. Has non-hormonal IUD. Denies long distance traveling, stressful activity, pain/paresthesias in arms or Hx of heart problems. ? Shortness of Breath   Sent from [facility]: Intermittent left sided chest pain and dyspnea x2 months; Sx worse today. No known fever/URI Sx. Dx w/COVID around New Years. Has non-hormonal IUD. Denies long distance traveling, stressful activity, pain/paresthesias in arms or Hx of heart problems. HPI/PE:Pt with Past Medical History:No date: AnxietyNo date: Depression2016: Hx of tonsillectomyNo date: Strep throatNo date: Suicide attempt [facility] Code)    Comment:  April 2020: Ingestion of pills and cut wristsPresents with 2 month h/o intermittent L sided chest pain with associated sotb. States that she had been seen for chest pain in the past with holter monitor which was normal. She denotes no inciting event either in the beginning or today, but felt more intense today while at work doing normal activities (nothing new or more strenuous). She denies any history of reactive airway disease or wheezing, no recent travel or surgeries. She had COVID over new years but states this issue began before that. Furthermore denies cough, fever, chills, leg swelling, use of hormonal medications, history or family h/o VTE or significant cardiac disorders. External data reviewed: Labs, Radiology and ECG  Physical ExamED Triage Vitals [04/02/22 1920]BP: 111/74Pulse: 64Pulse from  O2 sat: n/aResp: 16Temp: 97.9 ?F (36.6 ?C)Temp src: OralSpO2: 98 % BP 110/76  - Pulse 64  - Temp 98 ?F (36.7 ?C)  - Resp 18  - Ht 5' 4 (1.626 m)  - Wt 68 kg (150 lb)  - LMP 03/04/2022  - SpO2 99%  - BMI 25.75 kg/m? Physical ExamVitals and nursing note reviewed. Constitutional: General: She is not in acute distress.   Appearance: She is well-developed. HENT:    Head: Normocephalic and atraumatic. Eyes:    Pupils: Pupils are equal, round, and reactive to light. Cardiovascular:    Rate and Rhythm: Normal rate and regular rhythm.    Heart sounds: Normal heart sounds. Pulmonary:    Effort: Pulmonary effort is normal.    Breath sounds: Normal breath sounds. Abdominal:    Palpations: Abdomen is soft. Skin:   General: Skin is warm and dry.    Capillary Refill: Capillary refill takes less than 2 seconds. Neurological:    Mental Status: She is alert and oriented to person, place, and time.  ProceduresAttestation/Critical CarePatient Reevaluation: XR Chest PA and LateralResult Date: 1/23/2024No radiographic evidence of acute cardiopulmonary abnormality. Please note that chest radiographs are of limited sensitivity for subtle groundglass opacities. [facility] Radiology Notify System Classification: Routine. Reported and signed by: Lavinia Sharps, MD  [facility] Radiology and Biomedical Imaging ==Ddimer mildly elevated at 0.58; otherwise labs stable troponin negativeAfter ASA pt describes feeling much better with minimal cp and no dyspneaWe discussed the ddimer at bedside; that it is mildly elevated which usually means that CTA would be performed. Pt is very well appearing is not tachycardic or tachypneic with excellent O2 saturation at room air and no significant DOE. Additionally no signs or sx of DVT and both PERC and Wells = 0. Pt states she would like to defer CTA in light of these factors and follow up with cardiology and PCP instead, return if worse. Given this information this seems reasonable and she appears very reliable to return with new or worsening symptoms which  was expressed with readback. Pt may continue to use antiinflammatories to help with her discomfort while awaiting her appointments, will give cardiology on call for follow up. Patient is comfortable with this plan of care.Strict return precautions given for new or worsening symptoms.At the time of disposition, the patient was stable and felt to be safe for discharge. Strict return precautions were discussed with the patient or their caregiver/guardian. They were advised to arrange prompt follow-up with a primary care provider over the next 1-2 days. They were advised to return to the nearest ED immediately, or call 911, if symptoms return/worsen, do not resolve, or any new concerns arise. They were advised to discuss their results from the ED visit with a primary care provider. The patient or their caregiver/guardian expressed understanding of this medical advice and the agreed upon discharge plan, as well as their ability to return to the ED, or call 911, if needed. All questions offered by the patient/family were answered.Patient progress: improvedClinical Impressions as of 04/03/22 0132 Chest pain, unspecified type  ED DispositionDischarge Laury Huizar, Lynelle Doctor, PA01/24/24 0132

## 2022-04-03 NOTE — ED Provider Notes
Chief Complaint Patient presents with ? Chest Pain   Intermittent left chest pain and sob for 2 months.  Today she c/o left chest tightness with sob.  No recent travel.  Has IUD. HPI/PE:This is a 27 y.o. female who presents to [facility] with complaints of left chest pain x 2 months. Pt reports intermittent pain and Sob over the last 2 months.  Patient says that today she felt increasing left-sided chest pain or shortness a breath.   Pt states she had COVID over New Years. Pt has a non-hormonal IUD. Denies any long distance traveling, stressful activity, pain radiating to the arms, numbness or tingling or hx of heart problems.   Physical ExamED Triage Vitals [04/02/22 1827]BP: 112/73Pulse: 63Pulse from  O2 sat: n/aResp: 16Temp: 97.7 ?F (36.5 ?C)Temp src: TemporalSpO2: 100 % BP 112/73  - Pulse 63  - Temp 97.7 ?F (36.5 ?C) (Temporal)  - Resp 16  - LMP 03/04/2022  - SpO2 100% Physical ExamVitals and nursing note reviewed. Constitutional:     General: She is not in acute distress.   Appearance: Normal appearance. She is not ill-appearing, toxic-appearing or diaphoretic. HENT:    Head: Normocephalic and atraumatic. Eyes:    Conjunctiva/sclera: Conjunctivae normal. Cardiovascular:    Rate and Rhythm: Normal rate and regular rhythm.    Heart sounds: Normal heart sounds. No murmur heard.Pulmonary:    Effort: Pulmonary effort is normal. No respiratory distress.    Breath sounds: Normal breath sounds. No stridor. No wheezing, rhonchi or rales. Chest:    Chest wall: No tenderness. Musculoskeletal:    Cervical back: Normal range of motion and neck supple. Neurological:    General: No focal deficit present.    Mental Status: She is alert and oriented to person, place, and time. Psychiatric:       Mood and Affect: Mood normal.       Behavior: Behavior normal.  ProceduresAttestation/Critical CarePatient Reevaluation: This is a 27 y.o. female who presents to [facility] with complaints of left chest pain x 2 months. Pt reports intermittent pain and Sob over the last 2 months.  Patient says that today she felt increasing left-sided chest pain or shortness a breath.   Pt states she had COVID over New Years. Pt has a non-hormonal IUD. Denies any long distance traveling, stressful activity, pain radiating to the arms, numbness or tingling, cough cold symptoms or hx of heart problems. EKG: NSR, ventricular rate 66 beats per minute, incomplete right bundle-branch block. New from previous EKG in 2023I discussed with patient going over now to [facility] emergency room for further cardiopulmonary workup of left-sided chest pain with shortnessPatient expresses understanding and agrees with planPatient progress: stableClinical Impressions as of 04/02/22 1846 Chest pain, unspecified type  ED DispositionNo disposition selected since last refresh of note.By signing my name below, I, Frutoso Schatz, attest that this documentation has been prepared under the direction and in the presence of Dr. Wyatt Haste.This chart was generated with the assistance of a scribe. I was present and performed the services documented. I have reviewed the documentation and verify its accuracy. Gypsy Balsam, MD01/23/24 1902

## 2022-04-03 NOTE — Discharge Instructions
In the medical field, there is always a level of diagnostic uncertainty, even if this uncertainty is low. For this reason, it is important to immediately return to the emergency department if you have any new symptoms, worsening symptoms, change of symptoms, or if you have any other concerns. We would be happy to re-evaluate you. Otherwise, please take tylenol, ibuprofen, or aspirin as needed and follow-up as recommended.

## 2022-05-05 ENCOUNTER — Encounter: Admit: 2022-05-05 | Payer: PRIVATE HEALTH INSURANCE | Attending: Cardiovascular Disease

## 2022-05-06 ENCOUNTER — Encounter: Admit: 2022-05-06 | Payer: MEDICAID | Attending: Cardiovascular Disease

## 2022-12-01 ENCOUNTER — Other Ambulatory Visit (HOSPITAL_BASED_OUTPATIENT_CLINIC_OR_DEPARTMENT_OTHER): Payer: Self-pay | Admitting: Family Medicine

## 2022-12-01 DIAGNOSIS — R87612 Low grade squamous intraepithelial lesion on cytologic smear of cervix (LGSIL): Secondary | ICD-10-CM

## 2022-12-01 NOTE — Telephone Encounter (Signed)
[facility], Dawn Merritt is a 27 year old female has requested a refill of Birth control      Last OFFICE Visit:  02/05/2022 Peggye Pitt, MD     Last TELE Visit: Recent Visits  No visits were found meeting these conditions.  Showing recent visits within past 365 days with a meds authorizing provider and meeting all other requirements  Future Appointments  No visits were found meeting these conditions.  Showing future appointments within next 0 days with a meds authorizing provider and meeting all other requirements      Last Physical Exam: 02/05/2022     There are no preventive care reminders to display for this patient.    Other Med Adult:  Most Recent BP Reading(s)  02/05/22 : 115/74        Cholesterol (mg/dL)   Date Value   16/12/9602 187     LOW DENSITY LIPOPROTEIN DIRECT (mg/dL)   Date Value   54/11/8117 106     HIGH DENSITY LIPOPROTEIN (mg/dL)   Date Value   14/78/2956 65     TRIGLYCERIDES (mg/dL)   Date Value   21/30/8657 71         No results found for: "TSHSC"      No results found for: "TSH"    HEMOGLOBIN A1C (%)   Date Value   09/08/2019 4.5       No results found for: "POCA1C"      No results found for: "INR"    SODIUM (mmol/L)   Date Value   04/23/2016 142       POTASSIUM (mmol/L)   Date Value   04/23/2016 3.9           CREATININE (mg/dL)   Date Value   84/69/6295 0.5       Documented patient preferred pharmacies:    [id number] - [place], [place] - [address]  Phone: [phone] Fax: [phone]

## 2023-01-29 ENCOUNTER — Emergency Department: Admit: 2023-01-29 | Payer: MEDICAID

## 2023-01-29 ENCOUNTER — Inpatient Hospital Stay: Admit: 2023-01-29 | Discharge: 2023-01-29 | Payer: MEDICAID

## 2023-01-29 ENCOUNTER — Encounter: Admit: 2023-01-29 | Payer: PRIVATE HEALTH INSURANCE

## 2023-01-29 DIAGNOSIS — Z9089 Acquired absence of other organs: Secondary | ICD-10-CM

## 2023-01-29 DIAGNOSIS — J020 Streptococcal pharyngitis: Secondary | ICD-10-CM

## 2023-01-29 DIAGNOSIS — Z87898 Personal history of other specified conditions: Secondary | ICD-10-CM

## 2023-01-29 DIAGNOSIS — F419 Anxiety disorder, unspecified: Secondary | ICD-10-CM

## 2023-01-29 DIAGNOSIS — R1310 Dysphagia, unspecified: Secondary | ICD-10-CM

## 2023-01-29 DIAGNOSIS — F32A Depression: Secondary | ICD-10-CM

## 2023-01-29 DIAGNOSIS — T1491XA Suicide attempt, initial encounter: Secondary | ICD-10-CM

## 2023-01-29 NOTE — ED Provider Notes
 Chief Complaint Patient presents with  Dysphagia   C/o difficulty swallowing food and pills x 3 weeks. No resp distress HPI/PE:Patient is a 27 year old female comes to the Urgent Care complaining of increased difficulty swallowing for the past 3 weeks.  She denies any fevers, no recent illnesses and has never had this in the past.  Physical ExamED Triage Vitals [01/29/23 1653]BP: 108/69Pulse: 67Pulse from  O2 sat: n/aResp: 16Temp: 98.5 ?F (36.9 ?C)Temp src: n/aSpO2: 100 % BP 108/69  - Pulse 67  - Temp 98.5 ?F (36.9 ?C)  - Resp 16  - SpO2 100% Physical ExamVitals and nursing note reviewed. HENT:    Mouth/Throat:    Mouth: Mucous membranes are moist.    Pharynx: No oropharyngeal exudate or posterior oropharyngeal erythema. Musculoskeletal:    Cervical back: No tenderness. Lymphadenopathy:    Cervical: No cervical adenopathy. Psychiatric:       Mood and Affect: Mood is anxious.  ProceduresAttestation/Critical CarePatient Reevaluation: Unclear etiology of patient's symptoms.  I watched her drink a cup of water and she was able to get it down without any difficulty however she stated it felt like it was going down slowly.There is no signs of infection.  I will do a soft tissue of the neck but patient likely needs follow-up with gastroenterology for endoscopy.XR Neck Soft Tissue Final Result  No acute process.    [facility] Radiology Notify System Classification:  Routine.    Reported and signed by: Bradly Bienenstock, MD      [facility]   Clinical Impressions as of 01/29/23 1723 Dysphagia, unspecified type  ED DispositionDischarge Orinda Kenner, PA11/20/24 1703 Orinda Kenner, PA11/20/24 1723

## 2023-01-29 NOTE — Discharge Instructions
 Follow-up with your primary care physician for a referral to a gastroenterologist.  If your symptoms worsen you should go directly to the ER.

## 2023-01-29 NOTE — ED Notes
 EXAM BY KEVIN P.A. XRAY DONE

## 2023-02-25 ENCOUNTER — Encounter (HOSPITAL_BASED_OUTPATIENT_CLINIC_OR_DEPARTMENT_OTHER): Payer: Self-pay | Admitting: Family Medicine

## 2023-02-25 ENCOUNTER — Other Ambulatory Visit: Payer: Self-pay

## 2023-02-25 ENCOUNTER — Ambulatory Visit: Payer: BC Managed Care – PPO | Attending: Family Medicine | Admitting: Family Medicine

## 2023-02-25 VITALS — BP 112/68 | HR 73 | Temp 96.7°F | Ht 64.0 in | Wt 212.2 lb

## 2023-02-25 DIAGNOSIS — Z6836 Body mass index (BMI) 36.0-36.9, adult: Secondary | ICD-10-CM | POA: Diagnosis present

## 2023-02-25 DIAGNOSIS — E669 Obesity, unspecified: Secondary | ICD-10-CM | POA: Diagnosis present

## 2023-02-25 DIAGNOSIS — Z0000 Encounter for general adult medical examination without abnormal findings: Secondary | ICD-10-CM | POA: Diagnosis present

## 2023-02-25 DIAGNOSIS — Z833 Family history of diabetes mellitus: Secondary | ICD-10-CM | POA: Insufficient documentation

## 2023-02-25 LAB — HEMOGLOBIN A1C
ESTIMATED AVERAGE GLUCOSE: 94 mg/dL (ref 74–160)
HEMOGLOBIN A1C: 4.9 % (ref 4.0–5.6)

## 2023-02-25 LAB — LIPID PANEL
Cholesterol: 178 mg/dL (ref 0–239)
HIGH DENSITY LIPOPROTEIN: 71 mg/dL (ref 40–60)
LOW DENSITY LIPOPROTEIN DIRECT: 108 mg/dL (ref 0–189)
TRIGLYCERIDES: 75 mg/dL (ref 0–150)

## 2023-02-25 MED ORDER — NORETHINDRONE ACET-ETHINYL EST 1-20 MG-MCG PO TABS: 1.0000 | ORAL_TABLET | Freq: Every day | ORAL | 0 refills | Status: DC

## 2023-02-25 NOTE — Progress Notes (Signed)
SUBJECTIVE: Dawn Merritt is a 27 year old female here for routine well visit.  Occasional dry cough since had URI 4 weeks ago.  Notes it at home, not at work, and slowly improving.  Does not keep her up at night.    OCP working out well, however sometimes misses a period, or has some irregular spotting.  Wonders if it's ok.  Has noticed some new spider veins on legs.    Not getting much exercise, has mostly sitting job, and in a very nice relationship - so eating out more, hanging out more.  Thinks weight increase is associated.    ROS neg GI, GU (other than above) CV, pulm.    I have reviewed the patient's medical history in detail and updated the computerized patient record.     OBJECTIVE: She appears well, in no apparent distress.  Alert and oriented times three, pleasant and cooperative. Vital signs are as noted by the nurse. BP 112/68   Pulse 73   Temp 96.7 F (35.9 C) (Temporal)   Ht 5\' 4"  (1.626 m)   Wt 96.3 kg (212 lb 3.2 oz)   LMP 01/29/2023 (Approximate)   SpO2 100%   BMI 36.42 kg/m  Most Recent Weight Reading(s)  02/25/23 : 96.3 kg (212 lb 3.2 oz)  02/05/22 : 90.7 kg (200 lb)  02/05/21 : 93.3 kg (205 lb 9.6 oz)    Ears normal. Throat and pharynx normal. Neck supple. No adenopathy or masses in the neck or supraclavicular regions. Sinuses non tender.  The neck is supple and free of adenopathy or masses, the thyroid is normal without enlargement or nodules.  S1 and S2 normal, no murmurs, clicks, gallops or rubs. Regular rate and rhythm. Chest is clear; no wheezes or rales. No edema or JVD.  The abdomen is soft without tenderness, guarding, mass, rebound or organomegaly. Bowel sounds are normal. No CVA tenderness or inguinal adenopathy noted.  Legs with scattered spider veins, a few larger visible veins on thighs, nothing tender nor palpable.    Noted a new red lain on her left mid leg - wonders what it is.    Problem List             Diagnosed    Family history of diabetes mellitus     Overview      Annual A1c,   HEMOGLOBIN A1C (%)   Date Value   09/08/2019 4.5   05/14/2012 4.6     No results found for: "POCA1C"           BMI 36.0-36.9,adult     Overview     02/25/23 reviewed weight trajectory and what helped in past.  Labs today, and some suggestions for improving movement quantity daily.          For routine HM, declines covid and flu vaccines today  Labs sent along with routine STI screening (new partner since last year, mutually monogamous) - follow up 1 year or as needed.

## 2023-02-25 NOTE — Patient Instructions (Signed)
[employer]

## 2023-02-26 LAB — HIV ANTIGEN ANTIBODY 5TH GEN: HIVAGAB QUALITATIVE: NONREACTIVE

## 2023-02-26 LAB — TREPONEMA PALLIDUM AB IGG: TREPONEMA PALLIDUM AB IGG: NONREACTIVE

## 2023-02-27 LAB — CHLAMYDIA GC NAAT
CHLAMYDIA TRACHOMATIS NAAT: NEGATIVE
NEISSERIA GONORRHOEAE NAAT: NEGATIVE

## 2023-03-04 ENCOUNTER — Encounter (HOSPITAL_BASED_OUTPATIENT_CLINIC_OR_DEPARTMENT_OTHER): Payer: Self-pay | Admitting: Family Medicine

## 2023-03-29 ENCOUNTER — Emergency Department: Admit: 2023-03-29 | Payer: MEDICAID

## 2023-03-29 ENCOUNTER — Inpatient Hospital Stay: Admit: 2023-03-29 | Discharge: 2023-03-29 | Payer: MEDICAID

## 2023-03-29 ENCOUNTER — Inpatient Hospital Stay: Admit: 2023-03-29 | Discharge: 2023-03-29 | Payer: MEDICAID | Attending: Emergency Medicine

## 2023-03-29 DIAGNOSIS — F17290 Nicotine dependence, other tobacco product, uncomplicated: Secondary | ICD-10-CM

## 2023-03-29 DIAGNOSIS — R0602 Shortness of breath: Secondary | ICD-10-CM

## 2023-03-29 DIAGNOSIS — R079 Chest pain, unspecified: Secondary | ICD-10-CM

## 2023-03-29 DIAGNOSIS — R42 Dizziness and giddiness: Secondary | ICD-10-CM

## 2023-03-29 DIAGNOSIS — F419 Anxiety disorder, unspecified: Secondary | ICD-10-CM

## 2023-03-29 DIAGNOSIS — R091 Pleurisy: Secondary | ICD-10-CM

## 2023-03-29 LAB — BASIC METABOLIC PANEL
BKR ANION GAP: 12 (ref 5–16)
BKR BLOOD UREA NITROGEN: 13 mg/dL (ref 7–18)
BKR BUN / CREAT RATIO: 14.4 (ref 8.0–23.0)
BKR CALCIUM: 8.8 mg/dL (ref 8.5–10.5)
BKR CHLORIDE: 105 mmol/L (ref 98–107)
BKR CO2: 20 mmol/L — ABNORMAL LOW (ref 21–32)
BKR CREATININE DELTA: 0.05
BKR CREATININE: 0.9 mg/dL (ref 0.60–1.00)
BKR EGFR, CREATININE (CKD-EPI 2021): >60 mL/min/{1.73_m2} (ref >=60)
BKR GLUCOSE: 75 mg/dL (ref 70–100)
BKR POTASSIUM: 4 mmol/L (ref 3.5–5.1)
BKR SODIUM: 137 mmol/L (ref 136–145)

## 2023-03-29 LAB — CBC WITH AUTO DIFFERENTIAL
BKR WAM ABSOLUTE IMMATURE GRANULOCYTES.: 0.02 x 1000/ÂµL (ref 0.00–0.30)
BKR WAM ABSOLUTE LYMPHOCYTE COUNT.: 2.15 x 1000/ÂµL (ref 0.60–3.70)
BKR WAM ABSOLUTE NRBC (2 DEC): 0 x 1000/ÂµL (ref 0.00–1.00)
BKR WAM ANC (ABSOLUTE NEUTROPHIL COUNT): 6.02 x 1000/ÂµL (ref 2.00–7.60)
BKR WAM BASOPHIL ABSOLUTE COUNT.: 0.03 x 1000/ÂµL (ref 0.00–1.00)
BKR WAM BASOPHILS: 0.3 % (ref 0.0–1.4)
BKR WAM EOSINOPHIL ABSOLUTE COUNT.: 0.03 x 1000/ÂµL (ref 0.00–1.00)
BKR WAM EOSINOPHILS: 0.3 % (ref 0.0–5.0)
BKR WAM HEMATOCRIT (2 DEC): 37.5 % (ref 35.00–45.00)
BKR WAM HEMOGLOBIN: 12.6 g/dL (ref 11.7–15.5)
BKR WAM IMMATURE GRANULOCYTES: 0.2 % (ref 0.0–1.0)
BKR WAM LYMPHOCYTES: 24.6 % (ref 17.0–50.0)
BKR WAM MCH (PG): 27.8 pg (ref 27.0–33.0)
BKR WAM MCHC: 33.6 g/dL (ref 31.0–36.0)
BKR WAM MCV: 82.8 fL (ref 80.0–100.0)
BKR WAM MONOCYTE ABSOLUTE COUNT.: 0.49 x 1000/ÂµL (ref 0.00–1.00)
BKR WAM MONOCYTES: 5.6 % (ref 4.0–12.0)
BKR WAM MPV: 10.9 fL (ref 8.0–12.0)
BKR WAM NEUTROPHILS: 69 % (ref 39.0–72.0)
BKR WAM NUCLEATED RED BLOOD CELLS: 0 % (ref 0.0–1.0)
BKR WAM PLATELETS: 213 x1000/ÂµL (ref 150–420)
BKR WAM RDW-CV: 13 % (ref 11.0–15.0)
BKR WAM RED BLOOD CELL COUNT.: 4.53 M/ÂµL (ref 4.00–6.00)
BKR WAM WHITE BLOOD CELL COUNT: 8.7 x1000/ÂµL (ref 4.0–11.0)

## 2023-03-29 LAB — D-DIMER, QUANTITATIVE: BKR D-DIMER: 0.34 mg{FEU}/L (ref <=0.50)

## 2023-03-29 MED ORDER — HYDROXYZINE HCL 25 MG TABLET
25 | Freq: Once | ORAL | Status: CP
Start: 2023-03-29 — End: ?
  Administered 2023-03-29: 22:00:00 25 mg via ORAL

## 2023-03-29 MED ORDER — KETOROLAC 30 MG/ML (1 ML) INJECTION SOLUTION
30 | Freq: Once | INTRAVENOUS | Status: CP
Start: 2023-03-29 — End: ?
  Administered 2023-03-29: 22:00:00 30 mL via INTRAVENOUS

## 2023-03-29 MED ORDER — IBUPROFEN 600 MG TABLET
600 | ORAL_TABLET | Freq: Four times a day (QID) | ORAL | 1 refills | Status: AC | PRN
Start: 2023-03-29 — End: ?

## 2023-03-29 MED ORDER — HYDROXYZINE HCL 25 MG TABLET
25 | ORAL_TABLET | Freq: Three times a day (TID) | ORAL | 1 refills | Status: AC | PRN
Start: 2023-03-29 — End: ?

## 2023-03-29 NOTE — Discharge Instructions
 Tylenol or Ibuprofen AS NEEDED for painAtarax up to 3 times a day AS NEEDED for anxietyFollow up with your primary care physician for further management

## 2023-03-29 NOTE — ED Provider Notes
 Chief Complaint Patient presents with  Shortness of Breath   Pt reports lungs feel tight x 48 hrs. Denies recent illness/cough/fever. Reports SOB, worse with exertion. Reports episode of feeling dizzy today after walking up steps and doing laundry. Pt +smoker. Speaking in full clear sentences in triage, pulse ox 100% on room air HPI/PE:This is a 28 year old female with a history of anxiety, depression, sent in from the walk-in center for further evaluation of pleuritic chest pain which started over 24 hours ago.  Patient states that she feels like her years of smoking and vaping are catching up with her.  She feels like her lungs are inflamed, and she was having a hard time breathing/wheezing when walking up the stairs with laundry.  She denies leg swelling or calf pain.  She denies pregnancy.  She denies fevers or chills.  She denies congestion/body aches/malaise.  Physical ExamED Triage Vitals [03/29/23 1550]BP: 114/81Pulse: 71Pulse from  O2 sat: n/aResp: 18Temp: 98 ?F (36.7 ?C)Temp src: TemporalSpO2: 100 % BP 97/60  - Pulse 70  - Temp 98 ?F (36.7 ?C) (Temporal)  - Resp 18  - Ht 5' 5 (1.651 m)  - Wt 68 kg (150 lb)  - LMP 03/12/2023 Comment: pt denies chance of pregnancy - SpO2 100%  - BMI 24.96 kg/m? Physical ExamVitals and nursing note reviewed. Constitutional:     Comments: Anxious appearing HENT:    Head: Normocephalic and atraumatic.    Mouth/Throat:    Pharynx: Oropharynx is clear. No pharyngeal swelling or oropharyngeal exudate. Eyes:    Extraocular Movements: Extraocular movements intact. Neck:    Vascular: No JVD. Cardiovascular:    Rate and Rhythm: Normal rate and regular rhythm.    Pulses: Normal pulses.    Heart sounds: Normal heart sounds. No murmur heard.Pulmonary:    Effort: Pulmonary effort is normal.    Breath sounds: Normal breath sounds. No decreased breath sounds, wheezing, rhonchi or rales. Abdominal:    General: Bowel sounds are normal.    Palpations: Abdomen is soft. Musculoskeletal:       General: Normal range of motion.    Cervical back: Normal range of motion and neck supple.    Right lower leg: No edema.    Left lower leg: No edema. Skin:   General: Skin is warm and dry. Neurological:    Mental Status: She is alert and oriented to person, place, and time. Psychiatric:       Mood and Affect: Mood is anxious.  ProceduresAttestation/Critical CarePatient Reevaluation: I reviewed EKG which was done at the walk-in center just prior to arrival.  Interpreted by me, sinus rhythm, heart rate of 61, no acute ST elevations or blocks.  Her vital signs remained stable here.  There is no fever, no tachycardia or hypoxia, no tachypnea.  She is not in any acute respiratory distress.  She has no wheezing.  I suspect anxiety.  It is also likely that she has some pleurisy from vaping.  I did review her chest x-ray done at the walk-in center just prior to arrival:No acute cardiothoracic abnormality.[facility] Notify System Classification: Routine.Echo from 02/26/2023:?  The left ventricle is normal in size. Wall thickness is normal. Left ventricular systolic function is normal. The quantitative EF by 2D Simpson biplane is 61%. No wall motion abnormalities are present. Diastolic function is normal. ?  The right ventricle is normal in size. Right ventricular systolic function is normal. ?  No hemodynamically significant valvular abnormalities. ?  There is no previous study for comparison  in our system. Exercise Treadmill Stress Test 02/26/2023:?  Negative symptom limited exercise treadmill stress test for ischemia at a good workload. ?  A Bruce protocol stress test was performed. Overall, the patient's exercise capacity was normal. The patient reached stage 4 of the protocol after exercising for 11 minutes and 30 seconds (13.4 METS). I reviewed laboratory results which are stable.  Hemoglobin is within normal limits.  D-dimer is negative.  Patient was medicated here with hydroxyzine, on reassessment her anxiety appears much better.  Her vital signs remained stable.  She has no new complaints.  I have informed her of her stable ED workup here with low suspicion for acute cardiac process/PE.  I have encouraged her to continue supportive care treatment for her pleurisy.  Prescription for ibuprofen sent.  Patient also sent a prescription for hydroxyzine to use as needed for anxiety.  I have encouraged her to follow-up with her primary doctor for further outpatient management.  I have also encouraged her to use over-the-counter cough drops or Mucinex as needed for cough and congestion.  Will discharge home.Patient progress: stableClinical Impressions as of 03/29/23 1756 Pleurisy  ED DispositionDischarge  Pilar Grammes, MD01/18/25 1756

## 2023-03-29 NOTE — ED Provider Notes
 Chief Complaint Patient presents with  Pleurisy   Chest burning with a deep breath, sat is 100%. Pt admits to vaping and smoking majuarana daily. Stopped last night., hx of vasovagal. And suicide       HPI/PE:28 year old female presents to the Urgent Care for evaluation of chest pain and dizziness.  Patient states that for the past 24 hours she has had intermittent chest pain with associated pleuritic exacerbation of this pain.  She states the pain is sharp in nature and present bilaterally.  During these episodes patient endorses feeling short of breath.  Patient denies any recent travel, leg swelling, leg pain, or hormonal medication use.  Patient states that she is also had a cough productive of brown sputum which she states has been present since prior to symptom onset, and she believes is associated with her tobacco use.  Additionally, patient states that she has had constant dizziness present for 12-24 hours prior to onset of her chest pain.  She states that this increased in severity immediately prior to arriving at the facility.MDM:28 year old female presents to the urgent care for evaluation of chest pain, a productive cough, and dizziness.  Patient was afebrile and well-appearing initial presentation.  Patient was breathing comfortably in the exam room, she was not hypoxic.  Lungs were clear to auscultation bilaterally.  Normal heart rate noted, no murmurs, normal S1 and S2.  No reproduction of chest pain with thoracic palpation or upper extremity movement.  No abnormalities were noted during neurological exam.  Examination of both ears was also unremarkable.  A HINTS exam was performed with left eye horizontal nystagmus with left abduction, no other abnormalities present. An EKG was obtained revealing normal sinus rhythm with multiple PACs, normal axis, TWI in leads aVR and V1, no PACs present on prior EKG. A chest x-ray was obtained, with no acute abnormalities present on my preliminary interpretation.These findings were discussed in detail with the patient.  I recommended that she immediately seek care at [facility] Emergency Department for further evaluation for other potential etiologies of her symptoms including pulmonary embolism, and ACS.  Patient acknowledged understanding and was agreeable with this plan.  Physical ExamED Triage Vitals [03/29/23 1440]BP: 103/68Pulse: 73Pulse from  O2 sat: n/aResp: 20Temp: 98 ?F (36.7 ?C)Temp src: n/aSpO2: 100 % BP 103/68  - Pulse 73  - Temp 98 ?F (36.7 ?C)  - Resp 20  - LMP 03/12/2023 Comment: pt denies chance of pregnancy - SpO2 100% Physical ExamConstitutional:     General: She is not in acute distress.   Appearance: Normal appearance. HENT:    Head: Normocephalic.    Right Ear: Tympanic membrane, ear canal and external ear normal.    Left Ear: Tympanic membrane, ear canal and external ear normal.    Nose: Nose normal.    Mouth/Throat:    Mouth: Mucous membranes are moist.    Pharynx: Oropharynx is clear. Eyes:    Extraocular Movements:    Left eye: Nystagmus (With left abduction) present.    Conjunctiva/sclera: Conjunctivae normal.    Pupils: Pupils are equal, round, and reactive to light. Cardiovascular:    Rate and Rhythm: Normal rate and regular rhythm. No extrasystoles are present.   Heart sounds: Normal heart sounds, S1 normal and S2 normal. No murmur heard.Pulmonary:    Effort: Pulmonary effort is normal. No respiratory distress.    Breath sounds: Normal breath sounds. No wheezing or rhonchi. Chest:    Chest wall: No tenderness. Musculoskeletal:    Cervical back: Normal  range of motion.    Right lower leg: No edema.    Left lower leg: No edema. Skin:   General: Skin is warm and dry. Neurological:    General: No focal deficit present.    Mental Status: She is alert and oriented to person, place, and time.    GCS: GCS eye subscore is 4. GCS verbal subscore is 5. GCS motor subscore is 6.    Cranial Nerves: Cranial nerves 2-12 are intact.    Sensory: Sensation is intact.    Motor: Motor function is intact.    Coordination: Coordination is intact.    Gait: Gait is intact.        ProceduresAttestation/Critical CareClinical Impressions as of 03/29/23 1537 Chest pain, unspecified type Dizziness Cough, unspecified type  ED DispositionTransfer To Another Facility        Bobbye Morton, PA01/18/25 1537 Delrae Alfred Baldo Ash, PA01/18/25 1538

## 2023-03-29 NOTE — Discharge Instructions
 Go to [facility] Emergency Department for further evaluation and treatment.

## 2023-03-29 NOTE — ED Notes
 4:45 PMPt reporting tightness of my lungs. Denying any respiratory symptoms. Reporting SOB especially w exertion. Reporting some dizziness earlier today after walking up stairs and doing laundry. Pt endorses smoking marijuana + states I think its catching up to me. Pt A+Ox4, NAD, speaking in clear + complete sentences, resps even + unlabored on RA, SpO2 100% on RA. PIV placed, blood labs obtained + sent as ordered. Medicated per MAR. Vitals obtained. Awaiting bloodwork results. Call bell within reach. 5:50 PMPIV removed w catheter intact. D/c instructions reviewed w pt, pt verbalized understanding. Ambulated out of ED with steady gait + all belongings.

## 2023-04-08 ENCOUNTER — Encounter: Admit: 2023-04-08 | Payer: PRIVATE HEALTH INSURANCE | Attending: Otolaryngology

## 2023-04-08 ENCOUNTER — Inpatient Hospital Stay: Admit: 2023-04-08 | Discharge: 2023-04-08 | Payer: MEDICAID

## 2023-04-08 DIAGNOSIS — R0981 Nasal congestion: Secondary | ICD-10-CM

## 2023-06-19 ENCOUNTER — Other Ambulatory Visit: Payer: Self-pay

## 2023-06-19 ENCOUNTER — Emergency Department
Admission: EM | Admit: 2023-06-19 | Discharge: 2023-06-19 | Disposition: A | Discharge status: Home / Self Care | Attending: Student in an Organized Health Care Education/Training Program | Admitting: Student in an Organized Health Care Education/Training Program

## 2023-06-19 DIAGNOSIS — R1013 Epigastric pain: Secondary | ICD-10-CM | POA: Diagnosis present

## 2023-06-19 LAB — POC URINALYSIS
BILIRUBIN, URINE: NEGATIVE
GLUCOSE,URINE: NEGATIVE
LEUKOCYTE ESTERASE: NEGATIVE
NITRITE, URINE: NEGATIVE
PH URINE: 6 (ref 5.0–8.0)
PROTEIN, URINE: NEGATIVE
SPECIFIC GRAVITY, URINE: 1.02 (ref 1.003–1.030)
UROBILINOGEN URINE: 0.2 (ref 0.2–1.0)

## 2023-06-19 LAB — URINE PREGNANCY TEST (POINT OF CARE): HCG QUALITATIVE URINE: NEGATIVE

## 2023-06-19 MED ORDER — FAMOTIDINE 20 MG PO TABS
20.0000 mg | ORAL_TABLET | Freq: Once | ORAL | Status: AC
Start: 2023-06-19 — End: 2023-06-19
  Administered 2023-06-19: 20 mg via ORAL
  Filled 2023-06-19: qty 1

## 2023-06-19 MED ORDER — ALUMINUM & MAGNESIUM HYDROXIDE 200-200 MG/5ML PO SUSP
30.0000 mL | Freq: Once | ORAL | Status: AC
Start: 2023-06-19 — End: 2023-06-19
  Administered 2023-06-19: 30 mL via ORAL
  Filled 2023-06-19: qty 30

## 2023-06-19 MED ORDER — FAMOTIDINE 20 MG PO TABS
20.0000 mg | ORAL_TABLET | Freq: Two times a day (BID) | ORAL | 0 refills | Status: AC | PRN
Start: 2023-06-19 — End: 2023-06-29

## 2023-06-19 MED ORDER — LIDOCAINE VISCOUS HCL 2 % MT SOLN
10.0000 mL | Freq: Once | OROMUCOSAL | Status: AC
Start: 2023-06-19 — End: 2023-06-19
  Administered 2023-06-19: 10 mL via OROMUCOSAL
  Filled 2023-06-19: qty 15

## 2023-06-19 NOTE — Narrator Note (Signed)
 Patient Disposition  Patient education for diagnosis, medications, activity, diet and follow-up.  Patient left ED 3:52 PM.  Patient rep received written instructions.    Interpreter to provide instructions: No    Patient belongings with patient: YES    Have all existing LDAs been addressed? N/A    Have all IV infusions been stopped? N/A    Destination: Discharged to home    Pt verbalized understanding of discharge instructions, follow-up care, & prescriptions given. Pt ambulated with steady gait at time of discharge. All questions answered prior to departure. All belongings in pt's possession at time of departure.

## 2023-06-19 NOTE — ED Triage Note (Signed)
 Self presents to ED c/o sudden onset of mid abd pain, sharp and stabbing constant with waves of intensity, reports occurred after eating an expired toaster strudel, mother has been eating them as well with no issue. Denies any other sx. No meds PTA

## 2023-06-19 NOTE — Discharge Instructions (Addendum)
 Today you are seen for your abdominal pain.  Your exam is reassuring.  Likely irritation of your stomach lining.  Can try to eat foods to help with this.  Can use Pepcid 1-2 times per day to help with your symptoms.  Stay well-hydrated.    Follow-up with your primary care doctor for any ongoing symptoms    Return to the emergency department if you develop any new or worsening symptoms, worsening pain, unable to eat or drink.

## 2023-06-19 NOTE — ED Provider Notes (Signed)
 eMERGENCY dEPARTMENT Physician Assistant NOTE    The ED nursing record was reviewed.   The prior medical records as available electronically through Epic were reviewed.  The mode of arrival was Walk-in     This patient was seen with Emergency Department attending physician Dr.Oliff    CHIEF COMPLAINT    Patient presents with:  Abdominal Pain        HPI    Dawn Merritt is a 28 year old female abdominal pain ongoing since this morning.  Reports intermittent waves of stabbing pain in the epigastrium.  States that she did have a toaster strudel this morning that was expired however other people had similar food and did not get sick.  Denies any fevers or chills, chest pain, difficulty breathing, nausea vomiting/diarrhea, urinary symptoms.  No history of abdominal surgery.  No recent travel.      PAST MEDICAL HISTORY    Past Medical History:  09/08/2019: History of COVID-19      Comment:  05/2019 mild symptoms    PROBLEM LIST  Patient Active Problem List:     BMI 36.0-36.9,adult     Family history of diabetes mellitus     Peripheral retinal degeneration, lattice     Low grade squamous intraepith lesion on cytologic smear cervix (lgsil)        SURGICAL HISTORY    Past Surgical History:  No date: NO SIGNIFICANT SURGICAL HISTORY    CURRENT MEDICATIONS      Current Facility-Administered Medications:     ibuprofen (ADVIL,MOTRIN) tablet 600 mg, 600 mg, Oral, Once, Mervyn Ace, MD    Current Outpatient Medications:     famotidine (PEPCID) 20 MG tablet, Take 1 tablet by mouth 2 (two) times daily as needed  for up to 10 days, Disp: 20 tablet, Rfl: 0    norethindrone-ethinyl estradiol (JUNEL 1/20) 1-20 MG-MCG per tablet, Take 1 tablet by mouth daily, Disp: 360 tablet, Rfl: 0    ALLERGIES    Review of Patient's Allergies indicates:  No Known Allergies    FAMILY HISTORY    Review of patient's family history indicates:  Problem: No Known Problems      Relation: Mother          Age of Onset: (Not Specified)  Problem: Asthma       Relation: Father          Age of Onset: (Not Specified)          Comment: very mild  Problem: Diabetes      Relation: Maternal Grandmother          Age of Onset: (Not Specified)          Comment: and numerous other fmaily members/  Problem: Heart      Relation: Maternal Grandmother          Age of Onset: (Not Specified)          Comment: CAD, CHF  Problem: Cancer - Other      Relation: Maternal Grandmother          Age of Onset: (Not Specified)          Comment: renal  Problem: Alcohol/Drug Abuse      Relation: Maternal Grandfather          Age of Onset: (Not Specified)          Comment: alocholism  Problem: Asthma      Relation: Paternal Uncle          Age of Onset: (  Not Specified)  Problem: Cancer - Breast      Relation: FamHxNeg          Age of Onset: (Not Specified)  Problem: Cancer - Colon      Relation: FamHxNeg          Age of Onset: (Not Specified)  Problem: Cancer - Ovarian      Relation: FamHxNeg          Age of Onset: (Not Specified)        SOCIAL HISTORY    Social History     Socioeconomic History    Marital status: Single     Spouse name: Not on file    Number of children: Not on file    Years of education: Not on file    Highest education level: Not on file   Occupational History    Not on file   Tobacco Use    Smoking status: Never     Passive exposure: Yes    Smokeless tobacco: Never    Tobacco comments:     dad smoked in house   Substance and Sexual Activity    Alcohol use: Yes     Comment: 1-2 weekly    Drug use: No    Sexual activity: Yes     Partners: Male     Birth control/protection: Pill   Other Topics Concern    Not on file   Social History Narrative    08/2014 lives with parents in [place] studying fashion marketing.    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  Lots of meetings.   Social Drivers of  Catering manager Strain: Not on file  Food Insecurity: Not on file  Transportation Needs: Not on file  Physical Activity: Not on file  Stress: Not on file  Social Connections: Not on file  Intimate Partner Violence: Not on file  Housing Stability: Not on file    REVIEW OF SYSTEMS    The pertinent positives are reviewed in the HPI above. All other systems were reviewed and are negative.    PHYSICAL EXAM      BP 123/78   Pulse 97   Temp 98.7 F   Resp 18   LMP 05/22/2023 (Within Days)   SpO2 98%   GENERAL: Well-appearing, no distress.  SKIN:  Warm & Dry, no rash, no bruising.  HEAD:  Atraumatic.   EYES: PERRL. EOMI. Anicteric sclera.   ENT:   Oropharynx clear with moist mucous membranes.     NECK:  Supple with full painless ROM at the neck.   LUNGS:  Clear to auscultation bilaterally without rales, rhonchi or wheezing.   HEART:  Regular rate and rhythm.  No murmurs, rubs, or gallops.   ABDOMEN:  Soft, flat, without distension.  No abdominal tenderness.  No guarding, rigidity or masses.  MUSCULOSKELETAL:  No deformities.No cyanosis or edema.  NEUROLOGIC:  Normal speech.  Upper and lower extremity motor and sensory grossly intact. Alert & oriented x 3.  PSYCHIATRIC:  Normal affect. Appropriate for age, time of day, and situation        RESULTS  Results for orders placed or performed during the hospital encounter of 06/19/23 (from the past 24 hours)  POC Urinalysis    Collection Time: 06/19/23  3:12 PM   Result Value    COLOR YELLOW    CLARITY CLEAR    GLUCOSE,URINE NEGATIVE    BILIRUBIN, URINE NEGATIVE    KETONE, URINE TRACE (A)    SPECIFIC GRAVITY, URINE 1.020    UROBILINOGEN URINE 0.2    OCCULT BLOOD, URINE SMALL (A)    PH URINE 6.0    PROTEIN, URINE NEGATIVE    NITRITE, URINE NEGATIVE    LEUKOCYTE ESTERASE NEGATIVE   Urine Pregnancy (Point of Care)    Collection Time: 06/19/23  3:14 PM   Result Value    HCG QUALITATIVE URINE Negative    ONBOARD CONTROL PRESENT? Yes        RADIOLOGY  No orders to  display    No orders to display            MEDICATIONS ADMINISTERED ON THIS VISIT  Orders Placed This Encounter      famotidine (PEPCID) tablet 20 mg      lidocaine (XYLOCAINE) 2 % viscous solution 10 mL      aluminum-magnesium hydroxide (MAALOX) 200-200 mg/5 mL suspension 30 mL      famotidine (PEPCID) 20 MG tablet          Sig: Take 1 tablet by mouth 2 (two) times daily as needed  for up to 10 days          Dispense:  20 tablet          Refill:  0        ED COURSE & MEDICAL DECISION MAKING      I reviewed the patient's past medical history/problem list, past surgical history, medication list, social history and allergies. Pt remained hemodynamically stable during their stay in the emergency department.            ED Decision Making & Course:     Dawn Merritt is a 28 year old female who presented for evaluation of epigastric pain ongoing for last few hours, intermittently.  No nausea/vomiting/diarrhea, urinary symptoms.  Vitals reassuring.  Patient appears well and nontoxic.  Vitals reassuring.  Reports epigastric pain that comes and goes currently in no pain.  No abdominal tenderness.  Suspect likely GERD/gastritis causing her symptoms.  Urine without evidence of infection.  hCG negative.   As she is without pain and has reassuring abdominal exam, no indication for further workup.  Do not suspect acute intra-abdominal surgical cause of symptoms.  Plan for symptomatic management and reassessment.    Patient continues to do well, continues to have benign abdominal exam.  Tolerating p.o.  Safe for discharge home with close follow-up with PCP for any ongoing symptoms.  Discussed trial of bland diet, Pepcid.  Strict return precautions provided for any new or worsening symptoms, worsening pain, unable to tolerate p.o.    Records from an outside hospital system, primary care office, or prior inpatient notes were reviewed during this encounter: Office visit 02/25/2023    Visit summary including laboratory and imaging  results discussed with patient prior to discharge. Patient/parent/guardian has been provided with precautions to return to the ED for new or worsening symptoms, or if unable to obtain follow up care.        Condition: Stable  Disposition: Home      Diagnosis:   Epigastric pain  Arnetha Langton, PA-C    This patient encounter note was created using voice-recognition software and in real time during the ER visit. Please  excuse any typographical errors that have not been edited out.

## 2023-06-27 ENCOUNTER — Ambulatory Visit (HOSPITAL_BASED_OUTPATIENT_CLINIC_OR_DEPARTMENT_OTHER): Payer: Self-pay | Admitting: General Practice

## 2023-06-27 NOTE — Telephone Encounter (Signed)
 Reason for Disposition   Patient wants to be seen    Answer Assessment - Initial Assessment Questions  Pt calls to report congested cough and sinus congestion x1 month  Expectorating thick yellow/brown phlegm  Sinus pain and pressure: green/yellow drainage  Mild headache  No fever  No chest pain or SOB  LMP: 05/29/23  Just returned from a cruise to [place]    Protocols used: Cough-A-OH  Adult Respiratory Triage    I spoke with Patient:    Chief Complaint: congested cough, sinus congestion    Current day of symptoms: one month    Patient Active Problem List:     BMI 36.0-36.9,adult     Family history of diabetes mellitus     Peripheral retinal degeneration, lattice     Low grade squamous intraepith lesion on cytologic smear cervix (lgsil)    Review of Patient's Allergies indicates:  No Known Allergies    Emergency care:     Is the patient gasping for air or unable to speak? No    Does the patient have new severe chest pain? No    Is the patient lethargic or does the patient have altered mental status?  No     The patient has the following symptoms:     Fever: Denies     If fever, duration of fever: N/A    Nasal Congestion:  Reports    Nasal Discharge:  Green    Cough: Productive    Sputum: Yellow    Difficulty Breathing:  Denies    Worsening Symptoms from Baseline:  Reports    Chest Pain:  No pain    Other symptoms/pertinent information: None    COVID: not performed    Home Remedies: None    Advised per nursing triage protocol.  Verbalized understanding and agreement with instructions and disposition.     Recommended disposition for patient:  Disposition: See in Office within 3 days    If patient referred to UC/ED advised that they may require further follow up and testing after the visit with their primary care office.     Instructed patient to call back for any new, worsening, or worrisome symptoms or concerns any time day or night.    Telephone Call Outcome:  Single Call Resolution

## 2023-06-28 ENCOUNTER — Ambulatory Visit (HOSPITAL_BASED_OUTPATIENT_CLINIC_OR_DEPARTMENT_OTHER): Admitting: Family Medicine

## 2023-07-01 ENCOUNTER — Emergency Department: Admit: 2023-07-01 | Payer: MEDICAID

## 2023-07-01 ENCOUNTER — Inpatient Hospital Stay: Admit: 2023-07-01 | Discharge: 2023-07-01 | Payer: MEDICAID | Attending: Emergency Medicine

## 2023-07-01 DIAGNOSIS — Z87891 Personal history of nicotine dependence: Secondary | ICD-10-CM

## 2023-07-01 DIAGNOSIS — M549 Dorsalgia, unspecified: Secondary | ICD-10-CM

## 2023-07-01 DIAGNOSIS — S29019A Strain of muscle and tendon of unspecified wall of thorax, initial encounter: Secondary | ICD-10-CM

## 2023-07-01 MED ORDER — ONDANSETRON TABLET (ED TAKE HOME)
Status: CP
Start: 2023-07-01 — End: ?

## 2023-07-01 MED ORDER — KETOROLAC 30 MG/ML (1 ML) INJECTION SOLUTION
30 | Freq: Once | INTRAMUSCULAR | Status: CP
Start: 2023-07-01 — End: ?
  Administered 2023-07-01: 21:00:00 30 mL via INTRAMUSCULAR

## 2023-07-01 MED ORDER — ONDANSETRON 4 MG DISINTEGRATING TABLET
4 | ORAL_TABLET | Freq: Three times a day (TID) | ORAL | 1 refills | Status: AC | PRN
Start: 2023-07-01 — End: ?

## 2023-07-01 MED ORDER — ONDANSETRON 4 MG DISINTEGRATING TABLET
4 | ORAL_TABLET | Freq: Three times a day (TID) | 1 refills | Status: AC | PRN
Start: 2023-07-01 — End: ?

## 2023-07-01 NOTE — Discharge Instructions
 Your EKG looks normal without any problems seen with the your heart or rhythm.Your chest x-ray is clear without evidence of collapsed lung, with clear lungs, no fluid, evidence of infection, or other concerning pathology seen.  Your presentation does not sound consistent with a pulmonary embolism.With the vomiting you had last night, this can cause intercostal muscle strain and spasm and with being a little dehydrated can make the muscles titer and make the pain worse.Be sure to drink plenty of fluids.For pain you can take over-the-counter ibuprofen  600 mg which is 3 tablets every 6-8 hours with food to avoid stomach upset.  This will help the inflammation as well.  We gave you a dose of Toradol  here which should help the inflammation a good deal.Zofran  4 mg every 6 hours as needed for nausea.Return for any new or worsening this.

## 2023-07-01 NOTE — ED Provider Notes
 Chief Complaint Patient presents with  Back Pain   Pt reports pain to left lower lung when takes a deep breath since yesterday. Reports intermittent discomfort in lungs since January, stopped smoking d/t discomfort. Denies SOB/fever/cough. Denies recent travel, non hormonal birth control History obtained from the patient.  Prior records reviewed.  Any ordered and resulted laboratory results during this ED visit as well as initial read of EKG and radiology studies that were ordered are interpreted by me, the ED physician. Official radiology readings reviewed later by me as officially interpreted by the radiologist.  MDM  Physical ExamED Triage Vitals [07/01/23 1812]BP: 119/69Pulse: 66Pulse from  O2 sat: n/aResp: 18Temp: 98.1 ?F (36.7 ?C)Temp src: OralSpO2: 100 % BP 119/69  - Pulse 66  - Temp 98.1 ?F (36.7 ?C) (Oral)  - Resp 18  - Ht 5' 5 (1.651 m)  - Wt 68 kg (150 lb)  - SpO2 100%  - BMI 24.96 kg/m? Physical ExamVitals reviewed. Constitutional:     Appearance: She is well-developed. HENT:    Head: Normocephalic and atraumatic. Eyes:    Conjunctiva/sclera: Conjunctivae normal.    Pupils: Pupils are equal, round, and reactive to light. Cardiovascular:    Rate and Rhythm: Regular rhythm. Pulmonary:    Effort: Pulmonary effort is normal. No respiratory distress.    Breath sounds: Normal breath sounds. No stridor. No wheezing, rhonchi or rales. Chest:    Chest wall: No tenderness. Abdominal:    General: Bowel sounds are normal.    Palpations: Abdomen is soft.    Tenderness: There is no abdominal tenderness. Musculoskeletal:       General: No swelling or tenderness. Normal range of motion.    Cervical back: Normal range of motion and neck supple. Skin:   General: Skin is warm and dry.    Capillary Refill: Capillary refill takes less than 2 seconds. Neurological:    Mental Status: She is alert and oriented to person, place, and time.    Cranial Nerves: No cranial nerve deficit.    Sensory: No sensory deficit.    Motor: No weakness or abnormal muscle tone. Psychiatric:       Mood and Affect: Mood normal.       Behavior: Behavior normal.  ProceduresAttestation/Critical CareHPI/MDM/DDx:28 with a history of anxiety, depression, and other history as noted, presents the ED reporting this morning having severe pain to left infra scapular area that is worse with deep breathing.  Some degree of discomfort yesterday, though much worse today.  She has been reporting having intermittent bouts of thoracic pain over the past several months often with breathing though will tend to change locations from the right side of her thorax, back of her ribs, and on the left as well.  This has pain historically has lasted only minutes at a time.  However, the pain today to the left scapular area is worse than what she usually experiences being much more sharp.  The patient believes she may have had food poisoning last night as she had multiple bouts of vomiting and diarrhea.  This has since abated, and she has been able to drink fluids since.  No cough or cold-like symptoms.  No leg swelling or calf pain.  No long road trips or flights.  No history of DVT or PE.  The patient quit smoking and vaping 4 months ago when she was started getting some discomfort in the thorax.Workup:EKG:  NSR, normal axis, normal intervals.  No acute appearing ST elevations or depressions..CXR:  Negative  for acute infiltrate or abnormality.  No pneumothorax, infiltrate, pleural effusion or congestive heart failure.Intervention/plan:-Toradol  30 mg IM for her thoracic pain.-the patient has had some degree of migrating thoracic pain over the past several months though acutely worse especially today to left infra scapular area.  She was having multiple bouts of vomiting last night which I believe may have aggravated things.-chest x-ray is clear without evidence of pneumonia, CHF, or pleural effusion.  No wide mediastinum.-no leg swelling or calf pain.  Not on OCPs, not currently a smoker, with no obvious DVT or PE risk factors.  Low suspicion for PE or dissection.-EKG unremarkable without signs of pericarditis, ischemia, or other concerning findings.-based on her presentation in and findings, I believe she most likely has been intercostal muscle strain causing her thoracic pain at this point in time.-I am not certain what has been causing her intermittent brief bouts of thoracic/chest pain, though fortunately she has stopped smoking and vaping which probably played a role in some of her earlier symptoms.-she takes Tylenol  for pain, though I suggested that she take ibuprofen  600 mg every 8 hours for pain and inflammation with food to avoid stomach upset.  This will help her pain much better.-Zofran  4 mg every 6-8 hours as needed for nausea.-she was asked drink plenty of fluids considering her GI illness last night which fortunately abated.  No abdominal pain or concerning discomfort without suspicion for surgical abdominal pathology warranting advanced imaging.  She has been tolerating p.o. so I do not believe she needs IV fluids at this time.-return for any new or worse symptoms.  PCP follow up otherwise as needed later in the week.  The patient is comfortable with this plan.Clinical Impressions as of 07/01/23 2109 Thoracic myofascial strain, initial encounter  ED DispositionDischarge  Adria Hopkins, MD04/22/25 2109

## 2023-07-01 NOTE — ED Notes
 9:19 PM Pt is alert and oriented x4, in no acute distress. Breathing with ease on room air, denies SOB, lungs clear. Medicated for 4/10 back pain per MAR. Denies nausea, take home zofran  given per MAR, adm instructions provided. Discharge instructions provided, pt verbalized understanding, all questions answered. Pt ambulating with steady gait. Discharged home.

## 2023-07-02 ENCOUNTER — Ambulatory Visit (HOSPITAL_BASED_OUTPATIENT_CLINIC_OR_DEPARTMENT_OTHER): Payer: Self-pay

## 2023-07-02 NOTE — Telephone Encounter (Signed)
 Adult General Triage    I spoke with Patient:     Chief Complaint: ONGOING COUGH - ON ABX FOR A SINUS INFECTION     Current Day of Symptoms: 10 days     Patient Active Problem List:     BMI 36.0-36.9,adult     Family history of diabetes mellitus     Peripheral retinal degeneration, lattice     Low grade squamous intraepith lesion on cytologic smear cervix (lgsil)      Review of Patient's Allergies indicates:  No Known Allergies    Emergency care:     Loss of Consciousness:  No     Chest Pain:  No     Severe Difficulty Breathing:  No     Concern for life threatening condition:  No     The patient has the following symptoms:     Ongoing cough  On abx for a sinus infection  Ears were flushed- left ear was unable to be fully flushed  Using debrox in the left ear- still clogged            Home Remedies:  abx and debrox     Advised per nursing triage protocol.         Recommended disposition for patient:  Disposition: Advised to go to Urgent Care for Evaluation due to appointment availablity     If patient referred to UC/ED advised that they may require further follow up and testing after the visit with their primary care office.     Instructed patient to call back for any new, worsening, or worrisome symptoms or concerns any time day or night.    Advised per nursing triage protocol.     Telephone Call Outcome:  Single Call Resolution        Reason for Disposition  . Continuous (nonstop) coughing interferes with work or school and no improvement using cough treatment per Care Advice    Protocols used: Cough-A-OH

## 2023-09-07 ENCOUNTER — Emergency Department: Admit: 2023-09-07 | Payer: MEDICAID

## 2023-09-07 ENCOUNTER — Inpatient Hospital Stay: Admit: 2023-09-07 | Discharge: 2023-09-08 | Payer: MEDICAID

## 2023-09-07 DIAGNOSIS — R531 Weakness: Secondary | ICD-10-CM

## 2023-09-07 DIAGNOSIS — R0602 Shortness of breath: Principal | ICD-10-CM

## 2023-09-07 LAB — COMPREHENSIVE METABOLIC PANEL
BKR A/G RATIO: 1.2 (ref 1.0–2.2)
BKR ALANINE AMINOTRANSFERASE (ALT): 16 U/L (ref 14–63)
BKR ALBUMIN: 3.7 g/dL (ref 3.4–5.0)
BKR ALKALINE PHOSPHATASE: 38 U/L — ABNORMAL LOW (ref 46–116)
BKR ANION GAP: 10 (ref 5–16)
BKR ASPARTATE AMINOTRANSFERASE (AST): 11 U/L (ref 10–42)
BKR AST/ALT RATIO: 0.7
BKR BILIRUBIN TOTAL: 0.2 mg/dL (ref 0.2–1.2)
BKR BLOOD UREA NITROGEN: 12 mg/dL (ref 7–18)
BKR BUN / CREAT RATIO: 14.1 (ref 8.0–23.0)
BKR CALCIUM: 8.4 mg/dL — ABNORMAL LOW (ref 8.5–10.5)
BKR CHLORIDE: 107 mmol/L (ref 98–107)
BKR CO2: 24 mmol/L (ref 21–32)
BKR CREATININE DELTA: -0.05
BKR CREATININE: 0.85 mg/dL (ref 0.60–1.00)
BKR EGFR, CREATININE (CKD-EPI 2021): >60 mL/min/1.73m2 (ref >=60)
BKR GLOBULIN: 3.2 g/dL (ref 2.3–3.5)
BKR GLUCOSE: 78 mg/dL (ref 70–100)
BKR POTASSIUM: 3.7 mmol/L (ref 3.5–5.1)
BKR PROTEIN TOTAL: 6.9 g/dL (ref 6.0–8.0)
BKR SODIUM: 141 mmol/L (ref 136–145)

## 2023-09-07 LAB — CBC WITH AUTO DIFFERENTIAL
BKR WAM ABSOLUTE IMMATURE GRANULOCYTES.: 0.02 x 1000/ÂµL (ref 0.00–0.30)
BKR WAM ABSOLUTE LYMPHOCYTE COUNT.: 2.22 x 1000/ÂµL (ref 0.60–3.70)
BKR WAM ABSOLUTE NRBC: 0 x 1000/ÂµL (ref 0.00–1.00)
BKR WAM ANC (ABSOLUTE NEUTROPHIL COUNT): 4.68 x 1000/ÂµL (ref 2.00–7.60)
BKR WAM BASOPHIL ABSOLUTE COUNT.: 0.05 x 1000/ÂµL (ref 0.00–1.00)
BKR WAM BASOPHILS: 0.6 % (ref 0.0–1.4)
BKR WAM EOSINOPHIL ABSOLUTE COUNT.: 0.16 x 1000/ÂµL (ref 0.00–1.00)
BKR WAM EOSINOPHILS: 2.1 % (ref 0.0–5.0)
BKR WAM HEMATOCRIT: 34.8 % — ABNORMAL LOW (ref 35.00–45.00)
BKR WAM HEMOGLOBIN: 11.5 g/dL — ABNORMAL LOW (ref 11.7–15.5)
BKR WAM IMMATURE GRANULOCYTES: 0.3 % (ref 0.0–1.0)
BKR WAM LYMPHOCYTES: 28.8 % (ref 17.0–50.0)
BKR WAM MCH: 27.1 pg (ref 27.0–33.0)
BKR WAM MCHC: 33 g/dL (ref 31.0–36.0)
BKR WAM MCV: 81.9 fL (ref 80.0–100.0)
BKR WAM MONOCYTE ABSOLUTE COUNT.: 0.57 x 1000/ÂµL (ref 0.00–1.00)
BKR WAM MONOCYTES: 7.4 % (ref 4.0–12.0)
BKR WAM MPV: 11.5 fL (ref 8.0–12.0)
BKR WAM NEUTROPHILS: 60.8 % (ref 39.0–72.0)
BKR WAM NUCLEATED RED BLOOD CELLS: 0 % (ref 0.0–1.0)
BKR WAM PLATELETS: 217 x1000/ÂµL (ref 150–420)
BKR WAM RDW-CV: 13.2 % (ref 11.0–15.0)
BKR WAM RED BLOOD CELL COUNT.: 4.25 M/ÂµL (ref 4.00–6.00)
BKR WAM WHITE BLOOD CELL COUNT: 7.7 x1000/ÂµL (ref 4.0–11.0)

## 2023-09-07 LAB — SARS-COV-2 (COVID-19)/INFLUENZA A+B/RSV BY RT-PCR (BH GH LMW YH)
BKR INFLUENZA A: NEGATIVE
BKR INFLUENZA B: NEGATIVE
BKR RESPIRATORY SYNCYTIAL VIRUS: NEGATIVE
BKR SARS-COV-2 RNA (COVID-19) (YH): NEGATIVE

## 2023-09-07 LAB — URINALYSIS WITH CULTURE REFLEX      (BH LMW YH)
BKR BILIRUBIN, UA: NEGATIVE
BKR GLUCOSE, UA: NEGATIVE
BKR KETONES, UA: NEGATIVE
BKR NITRITE, UA: NEGATIVE
BKR PH, UA: 6.5 (ref 5.5–7.5)
BKR SPECIFIC GRAVITY, UA: >1.03 — ABNORMAL HIGH (ref 1.005–1.030)
BKR UROBILINOGEN, UA: <2 mg/dL (ref <=2.0)

## 2023-09-07 LAB — URINE MICROSCOPIC     (BH GH LMW YH)

## 2023-09-07 LAB — TROPONIN T HIGH SENSITIVITY, 0 HOUR BASELINE WITH REFLEX (BH GH LMW YH): BKR TROPONIN T HS 0 HOUR BASELINE: <6 ng/L

## 2023-09-07 LAB — D-DIMER, QUANTITATIVE: BKR D-DIMER: 0.23 mg{FEU}/L (ref <=0.50)

## 2023-09-07 LAB — MAGNESIUM: BKR MAGNESIUM: 1.7 mg/dL — ABNORMAL LOW (ref 1.8–2.5)

## 2023-09-07 NOTE — ED Notes
 8:03 PM Brought to ED for eval of weakness, lightheadness and a dullness in chest. Pt reports the pain is a 5/10 and has been present for over 1 month. Pt has seen PCP regarding complaint but has not had blood work done. 20g piv on L arm. Awaiting provider eval 8:30 PMCovid/flu/rsv sent to lab9:15 PMProvider at bedside9:22 PMBlood work ordered sent to lab. Pt aware a urine sample is needed 11:02 PMUrine sent to lab

## 2023-09-08 DIAGNOSIS — R5383 Other fatigue: Secondary | ICD-10-CM

## 2023-09-08 DIAGNOSIS — R0789 Other chest pain: Secondary | ICD-10-CM

## 2023-09-08 DIAGNOSIS — Z8659 Personal history of other mental and behavioral disorders: Secondary | ICD-10-CM

## 2023-09-08 DIAGNOSIS — Z20822 Contact with and (suspected) exposure to covid-19: Secondary | ICD-10-CM

## 2023-09-08 LAB — URINE CULTURE

## 2023-09-08 LAB — UA REFLEX CULTURE

## 2023-09-08 NOTE — ED Notes
 12:35 AM D/C by provider

## 2023-09-08 NOTE — Discharge Instructions
 Please follow up with your primary care providerReturn to the emergency department if you develop new or concerning symptomsI hope you feel better soon

## 2023-09-09 NOTE — ED Provider Notes
 Chief Complaint Patient presents with  Weakness   Pt presents today c/o weakness, lightheadedness, SOB, and dullness in chest 5/10. Pt states onset 30 min PTA. Pt reports symptoms intermittent x1 month. Pt saw PCP earlier this week, but has not gone for labs or chest x-ray. HPI/PE:28 year old female with a past medical history of anxiety and depression presents to the emergency department today with concerns for lightheadedness, shortness of breath, and feeling weak.  Patient states that about 2 hours ago she began having these symptoms describing chest discomfort as a 5/10 with the associated shortness of breath.  States she has had these symptoms intermittently over the last month.  She did see her PCP earlier this week but has not gone for the labs or chest x-ray which was ordered.  Patient does not not smoke, has had no recent travel, exogenous hormones, or lower extremity swelling.MDM:Ddx:  ACS, anxiety, PE, infection, viral illness, UTI,Assessment and plan:Patient well-appearing on exam, lying comfortably in the stretcher, nontoxic, in no acute distress Cardiac rate and rhythm is regular, equal chest rise and fall, no chest wall tenderness present on exam, lungs are clear to auscultation bilaterally, no wheezing, rhonchi, rales, patient is speaking in full complete sentences without any respiratory distress Chest x-ray: -->Impression:No acute cardiothoracic abnormality.[place] [facility] Notify System Classification: Routine.Reported and signed by: Kayla Roux, MDYale Radiology and Biomedical ImagingPlan for viral swab, labs including troponin and D-dimerStable vitals, patients oxygen saturation 100% on room airNegative COVID, flu, RSV CBC CMP unremarkable,Troponin <6Unremarkable D-dimer 0.23UA shows 1+ leukocyte, 3+ blood, plan to wait for culture as patient has no urinary symptomsOverall workup is reassuring.  Negative D-dimer and troponin. Advised patient follow up with the primary care provider as well as her OBGYN provider.  Strict return precautions were discussed with the patient at length.  Patient understands and agrees with the plan, stable for dischargeThis report has been generated using M-Modal dictation software. Although every attempt has been made by the clinician to the proofread this document, occasional misspellings and typographical errors may still be present.   Physical ExamED Triage Vitals [09/07/23 1929]BP: 118/80Pulse: 75Pulse from  O2 sat: n/aResp: 16Temp: 98.4 ?F (36.9 ?C)Temp src: TemporalSpO2: 100 % BP 118/80  - Pulse 75  - Temp 98.4 ?F (36.9 ?C) (Temporal)  - Resp 16  - Ht 5' 5 (1.651 m)  - Wt 76.2 kg (168 lb)  - SpO2 100%  - BMI 27.96 kg/m? Physical ExamVitals and nursing note reviewed. Constitutional:     Appearance: Normal appearance. She is not ill-appearing or toxic-appearing. HENT:    Head: Normocephalic and atraumatic.    Right Ear: External ear normal.    Left Ear: External ear normal.    Nose: Nose normal.    Mouth/Throat:    Mouth: Mucous membranes are moist.    Pharynx: Oropharynx is clear. Eyes:    Conjunctiva/sclera: Conjunctivae normal. Cardiovascular:    Rate and Rhythm: Normal rate and regular rhythm.    Pulses: Normal pulses.    Heart sounds: Normal heart sounds. Pulmonary:    Effort: Pulmonary effort is normal. No respiratory distress.    Breath sounds: Normal breath sounds. No stridor. No wheezing or rhonchi. Abdominal:    General: There is no distension.    Palpations: Abdomen is soft.    Tenderness: There is no abdominal tenderness. There is no guarding. Musculoskeletal:    Cervical back: Normal range of motion and neck supple. Lymphadenopathy:    Cervical: No cervical adenopathy. Skin:   General: Skin is  warm and dry.    Capillary Refill: Capillary refill takes less than 2 seconds. Neurological: General: No focal deficit present.    Mental Status: She is alert. Psychiatric:       Mood and Affect: Mood normal.  ProceduresAttestation/Critical CareClinical Impressions as of 09/09/23 2345 Other fatigue SOB (shortness of breath)  ED DispositionDischarge  Kyle Nest, PA07/01/25 2345

## 2024-01-01 ENCOUNTER — Emergency Department: Admit: 2024-01-01 | Payer: MEDICAID

## 2024-01-01 ENCOUNTER — Inpatient Hospital Stay
Admit: 2024-01-01 | Discharge: 2024-01-01 | Payer: MEDICAID | Attending: Student in an Organized Health Care Education/Training Program

## 2024-01-01 DIAGNOSIS — R5383 Other fatigue: Secondary | ICD-10-CM

## 2024-01-01 DIAGNOSIS — R0781 Pleurodynia: Principal | ICD-10-CM

## 2024-01-01 LAB — CBC WITH AUTO DIFFERENTIAL
BKR WAM ABSOLUTE IMMATURE GRANULOCYTES.: 0.01 x 1000/ÂµL (ref 0.00–0.30)
BKR WAM ABSOLUTE LYMPHOCYTE COUNT.: 2.22 x 1000/ÂµL (ref 0.60–3.70)
BKR WAM ABSOLUTE NRBC: 0 x 1000/ÂµL (ref 0.00–1.00)
BKR WAM ANC (ABSOLUTE NEUTROPHIL COUNT): 3.78 x 1000/ÂµL (ref 2.00–7.60)
BKR WAM BASOPHIL ABSOLUTE COUNT.: 0.03 x 1000/ÂµL (ref 0.00–1.00)
BKR WAM BASOPHILS: 0.5 % (ref 0.0–1.4)
BKR WAM EOSINOPHIL ABSOLUTE COUNT.: 0.13 x 1000/ÂµL (ref 0.00–1.00)
BKR WAM EOSINOPHILS: 2 % (ref 0.0–5.0)
BKR WAM HEMATOCRIT: 34.9 % — ABNORMAL LOW (ref 35.00–45.00)
BKR WAM HEMOGLOBIN: 11.7 g/dL (ref 11.7–15.5)
BKR WAM IMMATURE GRANULOCYTES: 0.2 % (ref 0.0–1.0)
BKR WAM LYMPHOCYTES: 33.4 % (ref 17.0–50.0)
BKR WAM MCH: 26.7 pg — ABNORMAL LOW (ref 27.0–33.0)
BKR WAM MCHC: 33.5 g/dL (ref 31.0–36.0)
BKR WAM MCV: 79.5 fL — ABNORMAL LOW (ref 80.0–100.0)
BKR WAM MONOCYTE ABSOLUTE COUNT.: 0.48 x 1000/ÂµL (ref 0.00–1.00)
BKR WAM MONOCYTES: 7.2 % (ref 4.0–12.0)
BKR WAM MPV: 12 fL (ref 8.0–12.0)
BKR WAM NEUTROPHILS: 56.7 % (ref 39.0–72.0)
BKR WAM NUCLEATED RED BLOOD CELLS: 0 % (ref 0.0–1.0)
BKR WAM PLATELETS: 194 x1000/ÂµL (ref 150–420)
BKR WAM RDW-CV: 13.6 % (ref 11.0–15.0)
BKR WAM RED BLOOD CELL COUNT.: 4.39 M/ÂµL (ref 4.00–6.00)
BKR WAM WHITE BLOOD CELL COUNT: 6.7 x1000/ÂµL (ref 4.0–11.0)

## 2024-01-01 LAB — BASIC METABOLIC PANEL
BKR ANION GAP: 10 (ref 7–17)
BKR BLOOD UREA NITROGEN: 15 mg/dL (ref 6–20)
BKR BUN / CREAT RATIO: 19.2 (ref 8.0–23.0)
BKR CALCIUM: 8.9 mg/dL (ref 8.8–10.2)
BKR CHLORIDE: 107 mmol/L (ref 98–107)
BKR CO2: 22 mmol/L (ref 20–30)
BKR CREATININE DELTA: -0.07
BKR CREATININE: 0.78 mg/dL (ref 0.40–1.30)
BKR EGFR, CREATININE (CKD-EPI 2021): >60 mL/min/1.73m2 (ref >=60)
BKR GLUCOSE: 99 mg/dL (ref 70–100)
BKR POTASSIUM: 4.1 mmol/L (ref 3.3–5.3)
BKR SODIUM: 139 mmol/L (ref 136–144)

## 2024-01-01 LAB — TROPONIN T HIGH SENSITIVITY, 1 HOUR WITH REFLEX (BH GH LMW YH)
BKR TROPONIN T HS 1 HOUR DELTA FROM 0 HOUR: 0 ng/L
BKR TROPONIN T HS 1 HOUR: <6 ng/L

## 2024-01-01 LAB — MAGNESIUM: BKR MAGNESIUM: 1.8 mg/dL (ref 1.7–2.4)

## 2024-01-01 LAB — HIV-1/HIV-2 ANTIBODY/ANTIGEN SCREEN W/REFLEX     (BH GH LMW YH): BKR HIV 1/2 ANTIBODY SCREEN (BH): NONREACTIVE

## 2024-01-01 LAB — TROPONIN T HIGH SENSITIVITY, 0 HOUR BASELINE WITH REFLEX (BH GH LMW YH): BKR TROPONIN T HS 0 HOUR BASELINE: <6 ng/L

## 2024-01-01 MED ORDER — IBUPROFEN 400 MG TABLET
400 | Freq: Once | ORAL | Status: CP
Start: 2024-01-01 — End: ?
  Administered 2024-01-01: 13:00:00 400 mg via ORAL

## 2024-01-01 MED ORDER — LIDOCAINE 4 % TOPICAL PATCH
4 | TRANSDERMAL | Status: DC
Start: 2024-01-01 — End: 2024-01-01

## 2024-01-01 MED ORDER — ACETAMINOPHEN 325 MG TABLET
325 | Freq: Once | ORAL | Status: CP
Start: 2024-01-01 — End: ?
  Administered 2024-01-01: 13:00:00 325 mg via ORAL

## 2024-01-01 NOTE — ED Provider Notes
 Chief Complaint Patient presents with  Chest Pain   Left sided chest pain x 24 hrs. No Cough. Denies SOB. C/o fatigue.        An acute or life threatening problem was considered during this evaluation  A decision regarding hospitalization was made during this visit  External data reviewed: Notes (OSH or non-ED)Independent interpretation of: ECG and XRay Emergency Medicine Attending MDM: -----------------Vital Signs: BP 99/66  - Pulse 81  - Temp 98.2 ?F (36.8 ?C) (Temporal)  - Resp 18  - SpO2 100% Presentation:The patient is a 28 y.o. female with a history of anxiety and depression who presents with chest pain.Per the patient she was in her usual state of health over the past 1-2 weeks the patient has had vague left lower rib pain.  Patient states that 2 weeks ago she was rotating her torso and she felt as if there was a pop  or a crunch to her left lower rib.  Since then she has had intermittent mild left rib pain that is worse with deep inhalation.  The patient endorses reproducibility of the symptoms when she presses her left lower rib margin.  Patient declines any shortness of breath or chest pain otherwise.Patient declines any other traumas.  Review of systems otherwise negative for fevers chills URI GI or GU issues.Pertinent exam findings include: Gen: AOx3, nadMSK: mildly tender reproducible left lower rib margin, no ecchymosis no deformityNeuro:  Nonfocal full range motion of upper extremities and all extremitiesCV: RRRPulm: CTABLAbd: soft, non-tender, non-distendedMDM/Plan: 28 year old with the above history presenting with a lower rib margin pain.  Reproducibility of the symptoms push more towards the muscular etiology low concern for PE versus ACS with negative cardiac workup.  We will pursue chest x-ray and reassess after anti-inflammatories.Course: Laboratory workup largely unremarkable.  Chest x-ray negative.  Patient on re-evaluation with improved ongoing symptoms.  Patient okay for discharge at this time with strict return precautions.  Patient will follow up with primary care doctor in the outpatient setting.Dispo: Pecolia Beat MDPhysician, Department of Emergency [facility]Bridge[facility]----------------------------------------------------------------------------------------- Physical ExamED Triage Vitals [01/01/24 1024]BP: 99/66Pulse: 81Pulse from  O2 sat: n/aResp: 18Temp: 98.2 ?F (36.8 ?C)Temp src: TemporalSpO2: 100 % BP 99/66  - Pulse 81  - Temp 98.2 ?F (36.8 ?C) (Temporal)  - Resp 18  - SpO2 100% Physical Exam       ProceduresAttestation/Critical CareClinical Impressions as of 01/01/24 1249 Chest pain, unspecified type  ED DispositionNo disposition selected since last refresh of note.  Beat Skates, MD10/23/25 [phone]

## 2024-01-01 NOTE — Discharge Instructions
 For your pain, please take 400-600mg  of Ibuprofen 2-3x/day and Tylenol  600mg  2-3x/day.  In addition for your pain you may place ice 3 to 4 times a day for no longer than 20 minutes over the site of maximal pain.Please return to the nearest emergency department or call 911 if you develop new or worsening symptoms such as: fevers, chills, trouble breathing, chest pain, abdominal pain, inability to keep down foods or fluids, or if you have any other symptoms that concern you regarding your last visit. Please follow-up with a primary care provider as soon as possible (tomorrow, if possible). If you are unable to schedule prompt follow-up with a primary care provider, we encourage you to return to the nearest emergency department.If you are in need of establishing health care with a primary care doctor, feel free to call [phone] to schedule an appointment through a [facility]. Thank you for coming to [facility] for your medical care! It was our pleasure meeting and taking care of you.

## 2024-01-01 NOTE — ED Notes
 12:09 PM Pt is a 28 y.o female who presented to ED with c/o sharp pain in L flank, reports it radiates to chest. Rated 8/10. Describes as jolt, also said she does not think it is cardiac but more of a lung thing, lung cramp hx of smoking. On exam, pt A&Ox4, insisting she heard a popping in her L flank but when she auscultated, she did not hear anything. Also endorsing headache, and fatigue. Denies fevers, recent falls or trauma, n/v/d. Vital signs stable. Side rails up, call bell within reach, stretcher in lowest position and ID band in place.12:44 PM MD Buchla at bedside evaluating pt. Plan for CXR and then dispo.Kathrine Finder, RN

## 2024-01-20 ENCOUNTER — Other Ambulatory Visit: Payer: Self-pay

## 2024-03-02 ENCOUNTER — Ambulatory Visit: Admitting: Family Medicine

## 2024-03-02 ENCOUNTER — Other Ambulatory Visit: Payer: Self-pay

## 2024-03-12 ENCOUNTER — Other Ambulatory Visit: Payer: Self-pay

## 2024-03-12 ENCOUNTER — Ambulatory Visit: Attending: Physician Assistant | Admitting: Physician Assistant

## 2024-03-12 VITALS — BP 116/74 | HR 78 | Temp 98.4°F | Resp 19 | Ht 65.0 in | Wt 220.4 lb

## 2024-03-12 DIAGNOSIS — Z789 Other specified health status: Secondary | ICD-10-CM | POA: Diagnosis not present

## 2024-03-12 DIAGNOSIS — F4320 Adjustment disorder, unspecified: Secondary | ICD-10-CM | POA: Diagnosis not present

## 2024-03-12 DIAGNOSIS — Z0000 Encounter for general adult medical examination without abnormal findings: Secondary | ICD-10-CM | POA: Insufficient documentation

## 2024-03-12 LAB — HEMOGLOBIN A1C
ESTIMATED AVERAGE GLUCOSE: 88 mg/dL (ref 74–160)
HEMOGLOBIN A1C: 4.7 % (ref 4.0–5.6)

## 2024-03-12 LAB — LIPID PANEL
Cholesterol: 168 mg/dL (ref 0–239)
HIGH DENSITY LIPOPROTEIN: 71 mg/dL (ref 40–60)
LOW DENSITY LIPOPROTEIN DIRECT: 94 mg/dL (ref 0–189)
TRIGLYCERIDES: 88 mg/dL (ref 0–150)

## 2024-03-12 NOTE — Progress Notes (Signed)
 Review of Systems/HPI:    Problem:  -  Routine physical   UTD on dental  Not much exercise currently, works from home at stressful job   Feels sfae at home   No significant drinking   No smoking/ drugs     Adjustment DO:   Pt reports stress in life including job, moving, losing grandparent and general loss over past childhood home and family   Is intersted in therapist   Denies: SI/HI       All other systems reviewed and negative.    OBJECTIVE:    Vital Signs:  BP 116/74   Pulse 78   Temp 98.4 F (36.9 C) (Temporal)   Resp 19   Ht 5' 5 (1.651 m)   Wt 100 kg (220 lb 6.4 oz)   LMP 02/27/2024 (Exact Date)   SpO2 98%   BMI 36.68 kg/m   Pain Score: Data Unavailable    Physical Exam:  General: Well developed, well nourished female sitting comfortably  HEENT: Head normocephalic and atraumatic, PERRLA, EOMI, TMs normal in appearance bilaterally, oropharynx free of erythema and exudate, mucosa moist  Neck: Without LAD or thyromegaly, full range of motion   Cardiovascular: RRR with normal S1 and S2, no m/r/g appreciated  Respiratory: lungs clear to auscultation bilaterally without wheeze or rhonci, normal work of breathing  Abdomen:  Normal BS in 4 quadrants, soft, NT/ND without organomegaly or masses  Musculoskeletal: ROM of all major joints intact and without tenderness   Extremities: Warm and well perfused  Skin: Warm and dry without concerning lesions  Neurologic: Alert and oriented X3, cranial nerves 2-12 intact bilaterally, sensation and strength normal, gait normal   Psychiatric:  Mood and behavior normal with appropriate affect    ASSESSMENT AND PLAN:  Additional plans reviewed with patient and listed below under patient instructions and provided in AVS.    (Z00.00) Routine physical examination  (primary encounter diagnosis)  Comment:   Plan: LIPID PANEL, HEMOGLOBIN A1C        Discussed at this visit: diet, exercise, dental care, nutrition, personal and sexual safety, screening exams, vaccinations      (F43.20) Adjustment disorder, unspecified type  Comment:   Plan: RFL TO ADULT OUTPT PSYCH (NEW) BH PTS        Discussed with pt. Referral to therapy         Reasons to call or return to clinic were discussed.    HEALTH MAINTENANCE:  HEALTH CARE PROXY due on 08/16/2022  INFLUENZA VACCINE(1) due on 11/10/2023  COVID-19 Vaccine(4 - 2025-26 season) due on 11/10/2023    PATIENT INSTRUCTIONS:  See below for additional instructions provided to the patient with the After Visit Summary:  There are no Patient Instructions on file for this visit.    On the day of service, I spent 30-39 minutes caring for this patient, including time required for chart review, face-to-face patient evaluation, entering orders, counseling, and documentation of the encounter.      I explained the diagnosis and treatment plan, and the patient/parent/guardian expressed understanding of the content. We discussed all medicines prescribed and the importance of medication adherence. The patient/parent/guardian expressed understanding and no barriers to adherence were identified.  Possible side effects of the prescribed medication(s) were explained.  I attempted to answer all questions regarding the diagnosis and the proposed treatment.    We discussed the patients current medications including proper use and potential side effects. The patient expressed understanding and no barriers to adherence were identified.  1. The patient indicates understanding of these issues and agrees with the plan. Brief care plan is updated and reviewed with the patient.   2. The patient is given an After Visit Summary sheet that lists all medications with directions, allergies, orders placed during this encounter, and follow-up instructions.   3. I reviewed the patient's medical information and medical history   4. I reconciled the patient's medication list and prepared and supplied needed refills.   5. I have reviewed the past medical, family, and social history  sections including the medications and allergies.    Garnette JONETTA Gola, PA-C

## 2024-03-15 ENCOUNTER — Encounter (HOSPITAL_BASED_OUTPATIENT_CLINIC_OR_DEPARTMENT_OTHER): Payer: Self-pay | Admitting: Physician Assistant

## 2024-03-16 NOTE — Addendum Note (Signed)
 Addended by: Asusena Sigley on: 03/16/2024 01:35 PM     Modules accepted: Orders

## 2024-04-09 ENCOUNTER — Ambulatory Visit: Attending: Physician Assistant | Admitting: Clinical

## 2024-04-09 NOTE — Progress Notes (Unsigned)
 ADULT OUTPATIENT PSYCHIATRY     THERAPY INITIAL NOTE TEMPLATE    BH TREATMENT TEAM:  No data was found    REFERRED BY:    Dale Senior, PA-C  [address]  [place],  [place] [address]    REASON FOR REFERRAL: ***     CHIEF COMPLAINT: In the past year I'm dealing with some big life changes    HISTORY OF PRESENT ILLNESS:     Multiple life stressors:   - Has lived next door to her grandparents since she was born, they helped raised her. Two years ago grandmother died, grandfather died about a year ago.   - Also got engaged, will be getting married in September   - Family will also be selling the home she grew up in and has lived in until now, as well as her grandparents home. She has never lived somewhere else. It will be the first time   - Work can be stressful. 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 Enjoys what she does but can be up to ten hour days.    - I think I'm actually a pretty anxious person overall, worried about what people think, always try to keep the peace.     - Mood: Notes herself more overall stressed. I put on a mask at work, but then at night I feel lit the most.  - Sleep: 5-6 hours a night, sometimes almost 7 hours. Gets up 1-2 times a night to go to the bathroom. The past year or so it's been this 5-6 hour amount, before that she was getting more like 8 hours a night. Has trouble falling asleep, since COVID and TikTok have been big.   - Daytime energy: I rely on coffee a lot. Drinks it in the morning, lunch, and in the evening. Has had coffee in the evening around 6pm as long as she can remember, even since high school.    - Appetite: Good. Trying to lose weight.   - Exercise: sitting a lot for work. Wanting to be more active overall for my health.          CURRENT MEDICATIONS:    No current outpatient medications on file.     Current Facility-Administered Medications   Medication    ibuprofen  (ADVIL ,MOTRIN ) tablet 600  mg         LANGUAGE OF CARE:    English      LANGUAGE NEEDS MET:    English Used Effectively By Patient    CURRENT TREATMENT/CONTACT INFO FOR OTHER AGENCIES AND MENTAL HEALTH PROVIDERS (if applicable):    Contact/Community Support    No documentation.              MEDICAL HISTORY:  Past Medical History:  09/08/2019: History of COVID-19      Comment:  05/2019 mild symptoms     PROBLEM LIST:  Patient Active Problem List:     BMI 36.0-36.9,adult     Family history of diabetes mellitus     Peripheral retinal degeneration, lattice     Low grade squamous intraepith lesion on cytologic smear cervix (lgsil)      BIOLOGICAL FAMILY HISTORY/FAMILY CONSTELLATION:    Review of patient's family history indicates:  Problem: No Known Problems      Relation: Mother          Age of Onset: (Not Specified)  Problem: Asthma      Relation: Father  Age of Onset: (Not Specified)          Comment: very mild  Problem: Diabetes      Relation: Maternal Grandmother          Age of Onset: (Not Specified)          Comment: and numerous other fmaily members/  Problem: Heart      Relation: Maternal Grandmother          Age of Onset: (Not Specified)          Comment: CAD, CHF  Problem: Cancer - Other      Relation: Maternal Grandmother          Age of Onset: (Not Specified)          Comment: renal  Problem: Alcohol/Drug Abuse      Relation: Maternal Grandfather          Age of Onset: (Not Specified)          Comment: alocholism  Problem: Asthma      Relation: Paternal Uncle          Age of Onset: (Not Specified)  Problem: Cancer - Breast      Relation: FamHxNeg          Age of Onset: (Not Specified)  Problem: Cancer - Colon      Relation: FamHxNeg          Age of Onset: (Not Specified)  Problem: Cancer - Ovarian      Relation: FamHxNeg          Age of Onset: (Not Specified)  - Father with cocaine use disorder pt's whole life. The past year and a half it's been much worse, using almost every day. Father already has lost his sense of smell from  this. This is a stressor for pt. Leads father to be less present it pt's life. She hasn't shared this information with her fiance.   - Father on medication for this, overdosed on medication around 2021 which is when pt found out about father's issue-- I knew something was off since I was a kid, but that's what brought it out into the open.   - Mom sees [facility] therapist at [facility].     PAST PSYCHIATRIC HISTORY:   Past Psychiatric History:    No documentation.          - Grew up in [place] all her life. Only child, very close to parents. Very close with cousins. I'm a very family-oriented person   - Lives with parents. Plans to live with them until the wedding. Then plans to move in with partner. They are still deciding where they will live.   - Engaged to partner, have been together almost 3 years. Plan to get married in September. Describes partner as caring and supportive. Partner has PTSD, partner's family can be challenging. Pt reports there can sometimes be communication issues, trying to work on this with partner. I can skirt around things and have a hard time bringing things up because I like to keep the peace. Partner also has a therapist.   - Describes herself as a quiet, shy kid, had self confidence issues related to weight. I am a peacemaker, never wanted to rock the boat. Good student.   - Other supports: One very good friend from college. Some work acquaintances.   - Education: Has her bachelors from [employer]. Majored in fashion design.  - Work: see HPI  - Hobbies: Enjoys solicitor things.  Enjoys drawing, used to sew a lot. Likes field seismologist.   - Financial: Stable     PSYCHOSOCIAL:   OP Psychosocial Review    No documentation.              SUBSTANCE USE:     Substance Use    No documentation.          - Caffeine: 2-3 10 oz cups a day. Morning, afternoon, and evening.   - Tobacco: Denies   - Marijuana: Denies   - Alcohol: On weekends if out to dinner or with cousins. Max would  be 3 drinks. Typically 1-2 drinks if at all.   - Other drugs: Denies      COLUMBIA RISK ASSESSMENTS:     Suicide:   C-SSRS OP Triage Screener    No documentation.             C-SSRS Risk Assessment    No documentation.                 VIOLENCE:    Violence/Abuse Risk    No documentation.              Protective Factors:   Protective Factors    No documentation.          -      RISK ASSESSMENTS:       Violence: low (1)     Addiction: low (1)      ADDITIONAL SCREENINGS:       02/28/2024 02/25/2023 02/05/2022   PHQ-9 Screening   Anhedonia 1  0  0     Low mood/hopelessness 1  0  0        Patient-reported    Data saved with a previous flowsheet row definition            02/28/2024 02/25/2023 02/05/2022   GAD-7 Screening   Nervousness 1  0  0    Uncontrollable worry 1  0  0        Patient-reported           02/28/2024 02/25/2023 02/05/2022   ALCOHOL/DRUG SCREENING   Frequency of 4+ drinks/day past year (female) 0  0  1    Frequency of drug use past year 0  0  0        Patient-reported           02/28/2024 02/25/2023 02/05/2022   ALCOHOL (AUDIT) Screening   Frequency of 4+ drinks/day 0  0  1        Patient-reported         02/28/2024 02/25/2023 02/05/2022   DRUG (DAST) Screening   Drug use frequency 0  0  0        Patient-reported         06/19/2023   SDOH Screening   Patient willing to answer questions Yes    Yes   Worry food will run out Never   Food won't last Never   Threatend to shut utilities No   Trouble getting transportation No   Living situation today I have a steady place to live.   Internet access Yes, on a computer   Referral to community programs No       Multiple values from one day are sorted in reverse-chronological order          MENTAL STATUS EXAMINATION:   Mental Status Exam    No documentation.  BIO/PSYCHO/SOCIAL AND RISK FORMULATION(S):  ***    DIAGNOSES:    No diagnosis found.    PLAN:   Goals for therapy   - Communication: wants to speak up for herself, advocate for what she wants, be  effective  - Stress management   - Sleep improvement     CARE PLAN/ EPISODES:  No linked episodes         Rollo Hoose, LICSW

## 2024-04-15 ENCOUNTER — Other Ambulatory Visit (HOSPITAL_BASED_OUTPATIENT_CLINIC_OR_DEPARTMENT_OTHER): Payer: Self-pay | Admitting: Family Medicine

## 2024-04-15 DIAGNOSIS — Z0000 Encounter for general adult medical examination without abnormal findings: Secondary | ICD-10-CM

## 2024-04-15 NOTE — Telephone Encounter (Signed)
 PER who is calling: Patient (self), Dawn Merritt is a 29 year old female has requested a refill of JUNEL .    Last Office Visit: 03/12/24     Other Med Adult:  Most Recent BP Reading(s)  03/12/24 : 116/74        Cholesterol (mg/dL)   Date Value   98/97/7973 168     LOW DENSITY LIPOPROTEIN DIRECT (mg/dL)   Date Value   98/97/7973 94     HIGH DENSITY LIPOPROTEIN (mg/dL)   Date Value   98/97/7973 71     TRIGLYCERIDES (mg/dL)   Date Value   98/97/7973 88         No results found for: TSHSC      No results found for: TSH    HEMOGLOBIN A1C (%)   Date Value   03/12/2024 4.7       No results found for: POCA1C      No results found for: INR    SODIUM (mmol/L)   Date Value   04/23/2016 142       POTASSIUM (mmol/L)   Date Value   04/23/2016 3.9           CREATININE (mg/dL)   Date Value   97/86/7981 0.5       Documented patient preferred pharmacies:    [id number] - [place], [place] - [address]  Phone: [phone] Fax: [phone]

## 2024-04-30 ENCOUNTER — Ambulatory Visit: Admitting: Clinical
